# Patient Record
Sex: Female | Born: 1950 | Race: White | Hispanic: No | Smoking: Current every day smoker
Health system: Southern US, Community
[De-identification: ages and names within clinical notes are randomized; demographics above are authoritative.]

## PROBLEM LIST (undated history)

## (undated) DIAGNOSIS — Z8619 Personal history of other infectious and parasitic diseases: Secondary | ICD-10-CM

## (undated) DIAGNOSIS — N9489 Other specified conditions associated with female genital organs and menstrual cycle: Secondary | ICD-10-CM

## (undated) DIAGNOSIS — C801 Malignant (primary) neoplasm, unspecified: Secondary | ICD-10-CM

## (undated) DIAGNOSIS — N950 Postmenopausal bleeding: Secondary | ICD-10-CM

## (undated) DIAGNOSIS — L409 Psoriasis, unspecified: Secondary | ICD-10-CM

## (undated) DIAGNOSIS — Z923 Personal history of irradiation: Secondary | ICD-10-CM

## (undated) DIAGNOSIS — Z87442 Personal history of urinary calculi: Secondary | ICD-10-CM

## (undated) DIAGNOSIS — Z972 Presence of dental prosthetic device (complete) (partial): Secondary | ICD-10-CM

## (undated) DIAGNOSIS — M1990 Unspecified osteoarthritis, unspecified site: Secondary | ICD-10-CM

## (undated) DIAGNOSIS — Z9221 Personal history of antineoplastic chemotherapy: Secondary | ICD-10-CM

## (undated) DIAGNOSIS — M810 Age-related osteoporosis without current pathological fracture: Secondary | ICD-10-CM

## (undated) HISTORY — DX: Postmenopausal bleeding: N95.0

## (undated) HISTORY — DX: Psoriasis, unspecified: L40.9

## (undated) HISTORY — DX: Personal history of urinary calculi: Z87.442

## (undated) HISTORY — DX: Age-related osteoporosis without current pathological fracture: M81.0

## (undated) HISTORY — DX: Other specified conditions associated with female genital organs and menstrual cycle: N94.89

## (undated) HISTORY — PX: BREAST SURGERY: SHX581

## (undated) HISTORY — PX: LITHOTRIPSY: SUR834

## (undated) HISTORY — DX: Personal history of other infectious and parasitic diseases: Z86.19

---
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
## 1978-11-07 HISTORY — PX: BREAST EXCISIONAL BIOPSY: SUR124

## 1978-11-07 HISTORY — PX: TUBAL LIGATION: SHX77

## 2006-03-02 ENCOUNTER — Ambulatory Visit: Payer: Self-pay

## 2006-03-20 ENCOUNTER — Emergency Department: Payer: Self-pay | Admitting: Internal Medicine

## 2007-09-21 DIAGNOSIS — Z720 Tobacco use: Secondary | ICD-10-CM | POA: Insufficient documentation

## 2007-09-21 DIAGNOSIS — R002 Palpitations: Secondary | ICD-10-CM | POA: Insufficient documentation

## 2007-09-21 DIAGNOSIS — N951 Menopausal and female climacteric states: Secondary | ICD-10-CM | POA: Insufficient documentation

## 2007-10-10 ENCOUNTER — Ambulatory Visit: Payer: Self-pay

## 2008-09-30 ENCOUNTER — Ambulatory Visit: Payer: Self-pay | Admitting: Family Medicine

## 2008-09-30 DIAGNOSIS — L409 Psoriasis, unspecified: Secondary | ICD-10-CM | POA: Insufficient documentation

## 2008-09-30 DIAGNOSIS — K3189 Other diseases of stomach and duodenum: Secondary | ICD-10-CM | POA: Insufficient documentation

## 2008-10-21 ENCOUNTER — Ambulatory Visit: Payer: Self-pay | Admitting: Family Medicine

## 2008-11-07 HISTORY — PX: OTHER SURGICAL HISTORY: SHX169

## 2009-10-08 DIAGNOSIS — J449 Chronic obstructive pulmonary disease, unspecified: Secondary | ICD-10-CM | POA: Insufficient documentation

## 2009-11-04 ENCOUNTER — Ambulatory Visit: Payer: Self-pay | Admitting: Family Medicine

## 2010-10-11 ENCOUNTER — Ambulatory Visit: Payer: Self-pay

## 2010-11-09 ENCOUNTER — Ambulatory Visit: Payer: Self-pay

## 2011-12-15 ENCOUNTER — Ambulatory Visit: Payer: Self-pay | Admitting: Family Medicine

## 2012-05-01 ENCOUNTER — Ambulatory Visit: Payer: Self-pay | Admitting: Family Medicine

## 2012-12-17 ENCOUNTER — Ambulatory Visit: Payer: Self-pay | Admitting: Family Medicine

## 2013-06-13 ENCOUNTER — Ambulatory Visit: Payer: Self-pay | Admitting: Family Medicine

## 2013-06-21 DIAGNOSIS — N200 Calculus of kidney: Secondary | ICD-10-CM | POA: Insufficient documentation

## 2014-11-06 LAB — HEPATIC FUNCTION PANEL
ALT: 12 U/L (ref 7–35)
AST: 13 U/L (ref 13–35)

## 2014-11-06 LAB — LIPID PANEL
Cholesterol: 172 mg/dL (ref 0–200)
HDL: 56 mg/dL (ref 35–70)
LDL Cholesterol: 93 mg/dL
Triglycerides: 116 mg/dL (ref 40–160)

## 2014-11-06 LAB — BASIC METABOLIC PANEL
BUN: 15 mg/dL (ref 4–21)
Creatinine: 0.7 mg/dL (ref 0.5–1.1)
Glucose: 94 mg/dL
Potassium: 4.4 mmol/L (ref 3.4–5.3)
Sodium: 141 mmol/L (ref 137–147)

## 2014-11-06 LAB — CBC AND DIFFERENTIAL
HCT: 41 % (ref 36–46)
Hemoglobin: 13.6 g/dL (ref 12.0–16.0)
Platelets: 264 10*3/uL (ref 150–399)
WBC: 7.4 10^3/mL

## 2014-12-09 ENCOUNTER — Ambulatory Visit: Payer: Self-pay | Admitting: Family Medicine

## 2015-06-26 ENCOUNTER — Ambulatory Visit (INDEPENDENT_AMBULATORY_CARE_PROVIDER_SITE_OTHER): Payer: PRIVATE HEALTH INSURANCE | Admitting: Family Medicine

## 2015-06-26 ENCOUNTER — Encounter: Payer: Self-pay | Admitting: Family Medicine

## 2015-06-26 VITALS — BP 114/68 | HR 63 | Temp 98.6°F | Resp 16 | Wt 160.0 lb

## 2015-06-26 DIAGNOSIS — M722 Plantar fascial fibromatosis: Secondary | ICD-10-CM | POA: Insufficient documentation

## 2015-06-26 DIAGNOSIS — Z87442 Personal history of urinary calculi: Secondary | ICD-10-CM | POA: Insufficient documentation

## 2015-06-26 DIAGNOSIS — R1032 Left lower quadrant pain: Secondary | ICD-10-CM

## 2015-06-26 DIAGNOSIS — M810 Age-related osteoporosis without current pathological fracture: Secondary | ICD-10-CM | POA: Insufficient documentation

## 2015-06-26 DIAGNOSIS — R109 Unspecified abdominal pain: Secondary | ICD-10-CM

## 2015-06-26 DIAGNOSIS — Z720 Tobacco use: Secondary | ICD-10-CM | POA: Insufficient documentation

## 2015-06-26 LAB — POCT URINALYSIS DIPSTICK
Glucose, UA: NEGATIVE
Ketones, UA: NEGATIVE
Nitrite, UA: NEGATIVE
Protein, UA: NEGATIVE
Spec Grav, UA: 1.025
Urobilinogen, UA: 1
pH, UA: 6.5

## 2015-06-26 MED ORDER — CIPROFLOXACIN HCL 500 MG PO TABS: 500.0000 mg | ORAL_TABLET | Freq: Two times a day (BID) | ORAL | Status: AC

## 2015-06-26 MED ORDER — METRONIDAZOLE 500 MG PO TABS: 500.0000 mg | ORAL_TABLET | Freq: Two times a day (BID) | ORAL | Status: AC

## 2015-06-26 NOTE — Progress Notes (Signed)
Patient: Katherine Snyder Female    DOB: [birth year]   64 y.o.   MRN: [id number] Visit Date: 06/26/2015  Today's Provider: Lelon Huh, MD   Chief Complaint  Patient presents with  . Abdominal Pain    x 1 week   Subjective:    Abdominal Pain This is a new problem. The current episode started in the past 7 days. The problem occurs intermittently. The problem has been gradually worsening. The pain is located in the LLQ. The abdominal pain does not radiate. Associated symptoms include nausea and weight loss. Pertinent negatives include no anorexia, arthralgias, belching, constipation, diarrhea, dysuria, fever, flatus, frequency, headaches, hematuria, melena, myalgias or vomiting. The pain is aggravated by movement. Relieved by: resting. Treatments tried: Aleve. The treatment provided no relief.  Pain is constant for the last week. States had kidney stones in the past.  Last BM yesterday, no constipation or straining.       No Known Allergies Previous Medications   ALENDRONATE (FOSAMAX) 70 MG TABLET    Take 1 tablet by mouth once a week. Take 30 minutes prior to first meal of that day    Review of Systems  Constitutional: Positive for weight loss. Negative for fever.  Gastrointestinal: Positive for nausea and abdominal pain. Negative for vomiting, diarrhea, constipation, melena, anorexia and flatus.  Genitourinary: Negative for dysuria, frequency and hematuria.  Musculoskeletal: Negative for myalgias and arthralgias.  Neurological: Negative for headaches.    Social History  Substance Use Topics  . Smoking status: Current Every Day Smoker -- 0.75 packs/day    Types: Cigarettes  . Smokeless tobacco: Not on file  . Alcohol Use: No   Objective:   BP 114/68 mmHg  Pulse 63  Temp(Src) 98.6 F (37 C) (Oral)  Resp 16  Wt 160 lb (72.576 kg)  SpO2 97%  Physical Exam   General Appearance:    Alert, cooperative, no distress  Eyes:    PERRL, conjunctiva/corneas clear,  EOM's intact       Lungs:     Clear to auscultation bilaterally, respirations unlabored  Heart:    Regular rate and rhythm  Neurologic:   Awake, alert, oriented x 3. No apparent focal neurological           defect.   Abd:   No CVAT. Mild LLQ tenderness. No masses, no rebound. No Guarding. BS + x 4. Normal exam otherwise.    Results for orders placed or performed in visit on 06/26/15  POCT Urinalysis Dipstick  Result Value Ref Range   Color, UA Amber yellow    Clarity, UA clear    Glucose, UA negative    Bilirubin, UA small    Ketones, UA negative    Spec Grav, UA 1.025    Blood, UA Trace (Non hemolyzed)    pH, UA 6.5    Protein, UA negative    Urobilinogen, UA 1.0    Nitrite, UA negative    Leukocytes, UA Trace (A) Negative       Assessment & Plan:     1. . LLQ abdominal pain Diverticulosis versus renal calculi versus ovarian pathology. Treat empirically for diverticulosis/itis with BID cipro and metronidazole. She is in no distress and XR is now closed. She is to go to ER if pain worsens over weekend. Anticipate XR abdomen or pelvic u/s if not improved over the weekend.  - POCT Urinalysis Dipstick      Lelon Huh, MD  [facility]  [place]

## 2015-06-28 LAB — URINE CULTURE

## 2015-06-29 ENCOUNTER — Telehealth: Payer: Self-pay | Admitting: *Deleted

## 2015-06-29 DIAGNOSIS — R1032 Left lower quadrant pain: Secondary | ICD-10-CM

## 2015-06-29 NOTE — Telephone Encounter (Signed)
Tried to reach patient, no answer and vm not set up yet. Will try back later.

## 2015-06-29 NOTE — Telephone Encounter (Signed)
-----   Message from Birdie Sons, MD sent at 06/26/2015  5:04 PM EDT ----- Regarding: Follow up  Please check with patient to see if abdominal has improved over the weekend. If not weill need to order abdominal XR. Thanks.

## 2015-06-29 NOTE — Telephone Encounter (Signed)
-----   Message from Birdie Sons, MD sent at 06/28/2015  6:12 PM EDT ----- Urine cultures show minor urinary tract infection which should resolve with antibiotics that were prescribed last week. Sx should be nearly resolved. If not then need to get KUB Xray for left lower quadant pain.

## 2015-06-30 ENCOUNTER — Other Ambulatory Visit: Payer: Self-pay | Admitting: *Deleted

## 2015-06-30 DIAGNOSIS — R1032 Left lower quadrant pain: Secondary | ICD-10-CM

## 2015-06-30 NOTE — Telephone Encounter (Signed)
Patient returned call. Patient stated that she is not feeling any better, still having abd pain. Patient is agreeable to having the x-ray done.

## 2015-06-30 NOTE — Telephone Encounter (Signed)
Xray ordered. Patient notified.  ?

## 2015-07-01 ENCOUNTER — Ambulatory Visit
Admission: RE | Admit: 2015-07-01 | Discharge: 2015-07-01 | Disposition: A | Discharge status: Home / Self Care | Payer: No Typology Code available for payment source | Source: Ambulatory Visit | Attending: Family Medicine | Admitting: Family Medicine

## 2015-07-01 DIAGNOSIS — K5900 Constipation, unspecified: Secondary | ICD-10-CM | POA: Diagnosis not present

## 2015-07-01 DIAGNOSIS — R1032 Left lower quadrant pain: Secondary | ICD-10-CM | POA: Diagnosis present

## 2015-07-02 ENCOUNTER — Telehealth: Payer: Self-pay | Admitting: *Deleted

## 2015-07-02 NOTE — Telephone Encounter (Signed)
-----   Message from Birdie Sons, MD sent at 07/02/2015  8:16 AM EDT ----- Xrays shows Large amount of stool noted throughout the colon consistent with constipation. If she has been having difficulty with constipation then she should take a fleets enema and a dose of Miralax. If she does not feel constipated still having abdominal pain then she needs a pelvic ultrasound.

## 2015-07-03 NOTE — Telephone Encounter (Signed)
Patient was notified of results. Patient stated that she is still having abdominal pain. Patient stated that she has only been able to void small amounts of stool occasionally. Patient stated that she wants to try taking the enema and Miralax, however she wants to wait and take it on Friday night 07/03/15 due to work.

## 2016-02-11 ENCOUNTER — Encounter: Payer: Self-pay | Admitting: Family Medicine

## 2016-02-11 ENCOUNTER — Ambulatory Visit (INDEPENDENT_AMBULATORY_CARE_PROVIDER_SITE_OTHER): Payer: PRIVATE HEALTH INSURANCE | Admitting: Family Medicine

## 2016-02-11 VITALS — BP 136/76 | HR 62 | Temp 98.5°F | Resp 14 | Wt 159.0 lb

## 2016-02-11 DIAGNOSIS — J029 Acute pharyngitis, unspecified: Secondary | ICD-10-CM | POA: Diagnosis not present

## 2016-02-11 DIAGNOSIS — R05 Cough: Secondary | ICD-10-CM

## 2016-02-11 DIAGNOSIS — R059 Cough, unspecified: Secondary | ICD-10-CM

## 2016-02-11 LAB — POCT INFLUENZA A/B
Influenza A, POC: NEGATIVE
Influenza B, POC: NEGATIVE

## 2016-02-11 LAB — POCT RAPID STREP A (OFFICE): Rapid Strep A Screen: NEGATIVE

## 2016-02-11 MED ORDER — FIRST-DUKES MOUTHWASH MT SUSP
OROMUCOSAL | Status: DC
Start: 2016-02-11 — End: 2016-02-12

## 2016-02-11 NOTE — Progress Notes (Signed)
Patient ID: Katherine Snyder, female   DOB: 09/18/1951, 65 y.o.   MRN: [id number]   Patient: Katherine Snyder Female    DOB: 1950/12/27   65 y.o.   MRN: [id number] Visit Date: 02/11/2016  Today's Provider: [name], PA   Chief Complaint  Patient presents with  . Cough   Subjective:    Cough This is a new problem. The current episode started in the past 7 days. The problem has been unchanged. The problem occurs constantly. The cough is non-productive. Associated symptoms include rhinorrhea and a sore throat. Risk factors for lung disease include smoking/tobacco exposure. Treatments tried: Copywriter, advertising. The treatment provided no relief.   Past Medical History  Diagnosis Date  . Psoriasis   . History of kidney stones   . Osteoporosis   . History of chicken pox   . History of mumps   . History of measles    Patient Active Problem List   Diagnosis Date Noted  . History of kidney stones 06/26/2015  . OP (osteoporosis) 06/26/2015  . Plantar fasciitis 06/26/2015  . Current tobacco use 06/26/2015  . Chronic airway obstruction ([place]) 10/08/2009  . Psoriasis 09/30/2008  . 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 Menopausal symptom 09/21/2007  . Tobacco use 09/21/2007  . Awareness of heartbeats 09/21/2007   Past Surgical History  Procedure Laterality Date  . Injection varicose veins in legs  2010    Dr. Hulda Humphrey  . Lithotripsy  1994, 2004    for renal stones  . Tubal ligation  1980  . Appendectomy  1960   Family History  Problem Relation Age of Onset  . Hypertension Mother   . Breast cancer Mother   . Transient ischemic attack Mother   . Arthritis Mother   . Hypothyroidism Mother   . Lung cancer Mother   . Diabetes Father     type 2  . Hypertension Father   . Arthritis Father   . Hyperlipidemia Son   . Stomach cancer Maternal Grandmother   . Stroke Maternal Grandfather      Previous Medications   ALENDRONATE (FOSAMAX) 70 MG TABLET    Take 1 tablet  by mouth once a week. Take 30 minutes prior to first meal of that day   No Known Allergies  Review of Systems  Constitutional: Negative.   HENT: Positive for rhinorrhea and sore throat.   Eyes: Negative.   Respiratory: Positive for cough.   Cardiovascular: Negative.   Gastrointestinal: Negative.   Endocrine: Negative.   Genitourinary: Negative.   Musculoskeletal: Negative.   Skin: Negative.   Allergic/Immunologic: Negative.   Neurological: Negative.   Hematological: Negative.   Psychiatric/Behavioral: Negative.     Social History  Substance Use Topics  . Smoking status: Current Every Day Smoker -- 0.75 packs/day    Types: Cigarettes  . Smokeless tobacco: Not on file  . Alcohol Use: No   Objective:   BP 136/76 mmHg  Pulse 62  Temp(Src) 98.5 F (36.9 C) (Oral)  Resp 14  Wt 159 lb (72.122 kg)  Physical Exam  Constitutional: She is oriented to person, place, and time. She appears well-developed and well-nourished.  HENT:  Head: Normocephalic.  Right Ear: External ear normal.  Left Ear: External ear normal.  Nose: Nose normal.  Reddened tonsilar pillars without exudates.  Eyes: Conjunctivae are normal.  Neck: Normal range of motion. Neck supple.  Cardiovascular: Normal rate, regular rhythm and normal heart sounds.   Pulmonary/Chest: Effort normal and breath  sounds normal.  Abdominal: Soft. Bowel sounds are normal.  Lymphadenopathy:    She has no cervical adenopathy.  Neurological: She is alert and oriented to person, place, and time.  Skin: No rash noted.      Assessment & Plan:     1. Pharyngitis Onset over the past week with postnasal drip. No fever but remembered some of her family have had similar symptoms. Strep test and flu test negative. Suspect viral pharyngitis versus allergies. Treat with Duke's Magic Mouthwash and recheck prn. - POCT rapid strep A - POCT Influenza A/B  2. Cough Onset with sore throat and postnasal drip. May use antihistamine and  Delsym or Mucinex-DM. Increase fluids. Flu test negative. Recheck prn. - POCT Influenza A/B

## 2016-02-11 NOTE — Patient Instructions (Signed)

## 2016-02-12 ENCOUNTER — Telehealth: Payer: Self-pay | Admitting: Family Medicine

## 2016-02-12 DIAGNOSIS — J029 Acute pharyngitis, unspecified: Secondary | ICD-10-CM

## 2016-02-12 MED ORDER — PREDNISOLONE 15 MG/5ML PO SOLN: ORAL | Status: DC

## 2016-02-12 NOTE — Telephone Encounter (Signed)
Pt states her insurance will not cover the Rx Diphenhyd-Hydrocort-Nystatin (FIRST-DUKES MOUTHWASH) SUSP.  Pt is requesting a different Rx that insurance will cover.  [facility].  CB#[phone]/MW

## 2016-02-12 NOTE — Telephone Encounter (Signed)
Patient advised as directed below. Patient verbalized understanding.  

## 2016-02-12 NOTE — Telephone Encounter (Signed)
[facility] [place]. Use 1/2 teaspoon of the Prelone with 1/2 teaspoon of liquid Benadryl to gargle then swallow twice a day (after breakfast and at bedtime).

## 2016-02-12 NOTE — Telephone Encounter (Signed)
Simona Huh, Would you like to change this? Thanks  ED

## 2016-02-23 ENCOUNTER — Telehealth: Payer: Self-pay | Admitting: Family Medicine

## 2016-02-23 NOTE — Telephone Encounter (Signed)
Please advise 

## 2016-02-23 NOTE — Telephone Encounter (Signed)
Pt stated that prednisoLONE (PRELONE) 15 MG/5ML SOLN isn't helping and would like something else sent to [facility]. Pt stated her lips, tongue, and gums are burning. Please advise. Thanks TNP

## 2016-02-23 NOTE — Telephone Encounter (Signed)
Probably should see mouth, tongue and lips again if continuing to have symptoms. Was not able to leave message due to voicemail not set up (per recording).

## 2016-02-24 NOTE — Telephone Encounter (Signed)
Attempted to contact patient no answer nor voicemail set up.

## 2016-05-13 ENCOUNTER — Ambulatory Visit (INDEPENDENT_AMBULATORY_CARE_PROVIDER_SITE_OTHER): Payer: PRIVATE HEALTH INSURANCE | Admitting: Family Medicine

## 2016-05-13 ENCOUNTER — Encounter: Payer: Self-pay | Admitting: Family Medicine

## 2016-05-13 VITALS — BP 122/70 | HR 72 | Temp 97.9°F | Resp 16 | Ht 67.0 in | Wt 158.0 lb

## 2016-05-13 DIAGNOSIS — L409 Psoriasis, unspecified: Secondary | ICD-10-CM | POA: Diagnosis not present

## 2016-05-13 DIAGNOSIS — M810 Age-related osteoporosis without current pathological fracture: Secondary | ICD-10-CM

## 2016-05-13 DIAGNOSIS — Z720 Tobacco use: Secondary | ICD-10-CM | POA: Diagnosis not present

## 2016-05-13 DIAGNOSIS — Z1211 Encounter for screening for malignant neoplasm of colon: Secondary | ICD-10-CM | POA: Diagnosis not present

## 2016-05-13 DIAGNOSIS — Z0000 Encounter for general adult medical examination without abnormal findings: Secondary | ICD-10-CM

## 2016-05-13 DIAGNOSIS — M722 Plantar fascial fibromatosis: Secondary | ICD-10-CM

## 2016-05-13 DIAGNOSIS — Z124 Encounter for screening for malignant neoplasm of cervix: Secondary | ICD-10-CM

## 2016-05-13 DIAGNOSIS — L219 Seborrheic dermatitis, unspecified: Secondary | ICD-10-CM

## 2016-05-13 DIAGNOSIS — Z1239 Encounter for other screening for malignant neoplasm of breast: Secondary | ICD-10-CM

## 2016-05-13 LAB — POCT URINALYSIS DIPSTICK
Bilirubin, UA: NEGATIVE
Glucose, UA: NEGATIVE
Ketones, UA: NEGATIVE
Leukocytes, UA: NEGATIVE
Nitrite, UA: NEGATIVE
Protein, UA: NEGATIVE
Spec Grav, UA: 1.01
Urobilinogen, UA: 0.2
pH, UA: 6

## 2016-05-13 LAB — IFOBT (OCCULT BLOOD): IFOBT: NEGATIVE

## 2016-05-13 MED ORDER — DESONIDE 0.05 % EX CREA: TOPICAL_CREAM | CUTANEOUS | Status: DC

## 2016-05-13 MED ORDER — BETAMETHASONE DIPROPIONATE 0.05 % EX CREA: TOPICAL_CREAM | Freq: Two times a day (BID) | CUTANEOUS | Status: DC

## 2016-05-13 MED ORDER — ALENDRONATE SODIUM 70 MG PO TABS: 70.0000 mg | ORAL_TABLET | ORAL | Status: DC

## 2016-05-13 NOTE — Progress Notes (Signed)
Patient: Katherine Snyder, Female    DOB: 11-23-1950, 65 y.o.   MRN: [id number] Visit Date: 05/13/2016  Today's Provider: [name], PA   No chief complaint on file.  Subjective:    Annual physical exam Katherine Snyder is a 65 y.o. female who presents today for health maintenance and complete physical. She feels well. She reports exercising occasionally. She reports she is sleeping well.  -----------------------------------------------------------------   Review of Systems  Constitutional: Negative.   HENT: Negative.        Bad taste in mouth after using Icy Hot or Blue Emu creams.  Eyes: Negative.   Respiratory: Negative.   Cardiovascular: Negative.   Gastrointestinal: Negative.   Genitourinary: Negative.   Musculoskeletal: Negative.        Slight soreness in left thumb occasionally. Right heel pain from plantar fasciitis diagnosed in the past.  Skin: Positive for rash.       Intermittent flare of seborrhea in eyebrows and a spot of psoriasis on the right lower leg with occasional flare on elbows.  Neurological: Negative.     Social History      She  reports that she has been smoking Cigarettes.  She has been smoking about 0.75 packs per day. She does not have any smokeless tobacco history on file. She reports that she does not drink alcohol or use illicit drugs.       Social History   Social History  . Marital Status: Single    Spouse Name: N/A  . Number of Children: 1  . Years of Education: N/A   Occupational History  . Employed     Works at a [employer] doing [employer]   Social History Main Topics  . Smoking status: Current Every Day Smoker -- 0.75 packs/day    Types: Cigarettes  . Smokeless tobacco: None  . Alcohol Use: No  . Drug Use: No  . Sexual Activity: Not Asked   Other Topics Concern  . None   Social History Narrative    Past Medical History  Diagnosis Date  . Psoriasis   . History of kidney stones   . Osteoporosis   . History of  chicken pox   . History of mumps   . History of measles      Patient Active Problem List   Diagnosis Date Noted  . History of kidney stones 06/26/2015  . OP (osteoporosis) 06/26/2015  . Plantar fasciitis 06/26/2015  . Current tobacco use 06/26/2015  . Chronic airway obstruction ([place] 10/08/2009  . Psoriasis 09/30/2008  . 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 Menopausal symptom 09/21/2007  . Tobacco use 09/21/2007  . Awareness of heartbeats 09/21/2007    Past Surgical History  Procedure Laterality Date  . Injection varicose veins in legs  2010    Dr. Hulda Humphrey  . Lithotripsy  1994, 2004    for renal stones  . Tubal ligation  1980  . Appendectomy  1960    Family History        Family Status  Relation Status Death Age  . Mother Deceased 52    died from Metastatic cancer. Gallblader cancer  . Father Alive   . Sister Alive   . Brother Alive   . Son Alive   . Maternal Grandmother Deceased   . Maternal Grandfather Deceased         Her family history includes Arthritis in her father and mother; Breast cancer in her mother; Diabetes  in her father; Hyperlipidemia in her son; Hypertension in her father and mother; Hypothyroidism in her mother; Lung cancer in her mother; Stomach cancer in her maternal grandmother; Stroke in her maternal grandfather; Transient ischemic attack in her mother.    No Known Allergies  Current Outpatient Prescriptions on File Prior to Visit  Medication Sig Dispense Refill  . alendronate (FOSAMAX) 70 MG tablet Take 1 tablet by mouth once a week. Reported on 05/13/2016     No current facility-administered medications on file prior to visit.   Patient Care Team: Margo Common, PA as PCP - General (Physician Assistant)     Objective:   Vitals: BP 122/70 mmHg  Pulse 72  Temp(Src) 97.9 F (36.6 C)  Resp 16  Ht 5\' 7"  (1.702 m)  Wt 158 lb (71.668 kg)  BMI 24.74 kg/m2  Wt Readings from Last 3 Encounters:  05/13/16 158 lb  (71.668 kg)  02/11/16 159 lb (72.122 kg)  06/26/15 160 lb (72.576 kg)    Physical Exam  Constitutional: She is oriented to person, place, and time. She appears well-developed and well-nourished.  HENT:  Head: Normocephalic and atraumatic.  Right Ear: External ear normal.  Left Ear: External ear normal.  Nose: Nose normal.  Mouth/Throat: Oropharynx is clear and moist.  Edentulous - wearing upper dentures only today.  Eyes: Conjunctivae and EOM are normal. Pupils are equal, round, and reactive to light. Right eye exhibits no discharge.  Neck: Normal range of motion. Neck supple. No tracheal deviation present. No thyromegaly present.  Cardiovascular: Normal rate, regular rhythm, normal heart sounds and intact distal pulses.   No murmur heard. Pulmonary/Chest: Effort normal and breath sounds normal. No respiratory distress. She has no wheezes. She has no rales. She exhibits no tenderness.  Abdominal: Soft. She exhibits no distension and no mass. There is no tenderness. There is no rebound and no guarding.  Genitourinary: Vagina normal and uterus normal. Guaiac negative stool. No vaginal discharge found.  Musculoskeletal: Normal range of motion. She exhibits no edema or tenderness.  Lymphadenopathy:    She has no cervical adenopathy.  Neurological: She is alert and oriented to person, place, and time. She has normal reflexes. No cranial nerve deficit. She exhibits normal muscle tone. Coordination normal.  Skin: Skin is warm and dry. Rash noted. No erythema.  Minimal pink rash on extensor surfaces of both elbows from psoriasis. One nummular spot on the right lower leg approximately 1 cm in diameter and rough. Faint seborrhea dermatitis between eyebrows.  Psychiatric: She has a normal mood and affect. Her behavior is normal. Judgment and thought content normal.   Depression Screen PHQ 2/9 Scores 05/13/2016  PHQ - 2 Score 0   Assessment & Plan:     Routine Health Maintenance and Physical  Exam  Exercise Activities and Dietary recommendations Goals    Recommend 20-30 minutes of exercise 4 times a week with low fat diet.      Immunization History  Administered Date(s) Administered  . Pneumococcal Polysaccharide-23 10/08/2009  . Tdap 10/08/2009    Health Maintenance  Topic Date Due  . HIV Screening  09/14/1968  . COLONOSCOPY  09/15/2003  . ZOSTAVAX  09/14/2013  . INFLUENZA VACCINE  06/07/2016  . MAMMOGRAM  12/09/2016  . PAP SMEAR  11/06/2017  . TETANUS/TDAP  10/09/2019  . Hepatitis C Screening  Completed      Discussed health benefits of physical activity, and encouraged her to engage in regular exercise appropriate for her age and condition.    --------------------------------------------------------------------  1. Annual physical exam General health good. Wants to check with insurance about coverage of shingles vaccination and colonoscopy before scheduling. Will get routine follow up labs. Hemoccult test was negative today with good urinalysis.  - CBC with Differential/Platelet - Comprehensive metabolic panel - Lipid panel - TSH - POC Hemoccult Bld/Stl (1-Cd Office Dx) - POCT urinalysis dipstick  2. Psoriasis Only one lesion on the right lower leg without itching or discomfort. Diprolene has helped control outbreaks. Will give refill of it and check labs. - CBC with Differential/Platelet - betamethasone dipropionate (DIPROLENE) 0.05 % cream; Apply topically 2 (two) times daily. As directed for psoriasis on body.  Dispense: 30 g; Refill: 1  3. Current tobacco use Counseled regarding harmful effects of smoking. Should consider nicotine patches, tapering back or Chantix.   4. OP (osteoporosis) Last bone density test on 12-09-14 confirmed osteoporosis. Has stopped Fosamax a few months ago. Will recheck vitamin-D level and continue calcium supplement. - Comprehensive metabolic panel - VITAMIN D 25 Hydroxy (Vit-D Deficiency, Fractures) - alendronate  (FOSAMAX) 70 MG tablet; Take 1 tablet (70 mg total) by mouth once a week. Reported on 05/13/2016  Dispense: 12 tablet; Refill: 3  5. Seborrhea Oily pink rash between eyebrows with some flaking and oily texture. Good control with Desowen cream. Will refill and get CBC. - CBC with Differential/Platelet - desonide (DESOWEN) 0.05 % cream; Apply once a day sparingly to seborrhea as needed on face.  Dispense: 30 g; Refill: 1  6. Plantar fasciitis Pain in anterior medial heels when standing all day and sometimes after sitting for a while. May use OTC NSAID of choice and recheck if no better in a couple weeks. May need referral to podiatrist.  7. Breast cancer screening History of one benign breast biopsy of a lesion in the lower outer quadrant with well healed scar on the left breast (near 4 o'clock position). Will get screening mammograms. Last exam normal on 12-09-14. - MM Digital Screening  8. Cervical cancer screening Normal exam with slightly retroverted uterus. No lesions noted. Pap test collected. LMP age 75. - Pap IG (Image Guided)  9. Colon cancer screening Negative stool test for occult blood. Wants to check with her insurance about coverage before scheduling colonoscopy. - IFOBT POC (occult bld, rslt in office)   [name], [facility]

## 2016-05-13 NOTE — Patient Instructions (Signed)
Seborrheic Dermatitis Seborrheic dermatitis involves pink or red skin with greasy, flaky scales. It usually occurs on the scalp, and it is often called dandruff. This condition may also affect the eyebrows, nose, ears, chest, and the bearded area of men's faces. It often occurs where skin has more oil (sebaceous) glands. It may come and go for no known reason, and it is often long-lasting (chronic). CAUSES The cause is not known. RISK FACTORS This condition is more like to develop in:  People who are stressed or tired.  People who have skin conditions, such as acne.  People who have certain conditions, such as:  HIV (human immunodeficiency virus).  AIDS (acquired immunodeficiency syndrome).  Parkinson disease.  An eating disorder.  Stroke.  Depression.  Epilepsy.  Alcoholism.  People who live in places that have extreme weather.  People who have a family history of seborrheic dermatitis.  People who use skin creams that are made with alcohol.  People who are 31-39 years old.  People who take certain medicines. SYMPTOMS Symptoms of this condition include:  Thick scales on the scalp.  Redness on the face or in the armpits.  Skin that is flaky. The flakes may be white or yellow.  Skin that seems oily or dry but is not helped with moisturizers.  Itching or burning in the affected areas. DIAGNOSIS This condition is diagnosed with a medical history and physical exam. A sample of your skin may be tested (skin biopsy). You may need to see a skin specialist (dermatologist). TREATMENT There is no cure for this condition, but treatment can help to manage the symptoms. Treatment may include:  Cortisone (steroid) ointments, creams, and lotions.  Over-the-counter or prescription shampoos. HOME CARE INSTRUCTIONS  Apply over-the-counter and prescription medicines only as told by your health care provider.  Keep all follow-up visits as told by your health care provider.  This is important.  Try to reduce your stress, such as with yoga or mediation. If you need help to reduce stress, ask your health care provider.  Shower or bathe as told by your health care provider.  Use any medicated shampoos as told by your health care provider. SEEK MEDICAL CARE IF:  Your symptoms do not improve with treatment.  Your symptoms get worse.  You have new symptoms.   This information is not intended to replace advice given to you by your health care provider. Make sure you discuss any questions you have with your health care provider.   Document Released: 10/24/2005 Document Revised: 07/15/2015 Document Reviewed: 03/11/2015 Elsevier Interactive Patient Education 2016 Elsevier [employer] Psoriasis Psoriasis is a long-term (chronic) condition of skin inflammation. It occurs because your immune system causes skin cells to form too quickly. As a result, too many skin cells grow and create raised, red patches (plaques) that look silvery on your skin. Plaques may appear anywhere on your body. They can be any size or shape. Psoriasis can come and go. The condition varies from mild to very severe. It cannot be passed from one person to another (not contagious).  CAUSES  The cause of psoriasis is not known, but certain factors can make the condition worse. These include:   Damage or trauma to the skin, such as cuts, scrapes, sunburn, and dryness.  Lack of sunlight.  Certain medicines.  Alcohol.  Tobacco use.  Stress.  Infections caused by bacteria or viruses. RISK FACTORS This condition is more likely to develop in:  People with a family history of psoriasis.  People  who are Caucasian.  People who are between the ages of 15-13 and 55-12 years old. SYMPTOMS  There are five different types of psoriasis. You can have more than one type of psoriasis during your life. Types are:   Plaque.  Guttate.  Inverse.  Pustular.  Erythrodermic. Each type of psoriasis has  different symptoms.   Plaque psoriasis symptoms include red, raised plaques with a silvery white coating (scale). These plaques may be itchy. Your nails may be pitted and crumbly or fall off.  Guttate psoriasis symptoms include small red spots that often show up on your trunk, arms, and legs. These spots may develop after you have been sick, especially with strep throat.  Inverse psoriasis symptoms include plaques in your underarm area, under your breasts, or on your genitals, groin, or buttocks.  Pustular psoriasis symptoms include pus-filled bumps that are painful, red, and swollen on the palms of your hands or the soles of your feet. You also may feel exhausted, feverish, weak, or have no appetite.  Erythrodermic psoriasis symptoms include bright red skin that may look burned. You may have a fast heartbeat and a body temperature that is too high or too low. You may be itchy or in pain. DIAGNOSIS  Your health care provider may suspect psoriasis based on your symptoms and family history. Your health care provider will also do a physical exam. This may include a procedure to remove a tissue sample (biopsy) for testing. You may also be referred to a health care provider who specializes in skin diseases (dermatologist).  TREATMENT There is no cure for this condition, but treatment can help manage it. Goals of treatment include:   Helping your skin heal.  Reducing itching and inflammation.  Slowing the growth of new skin cells.  Helping your immune system respond better to your skin. Treatment varies, depending on the severity of your condition. Treatment may include:   Creams or ointments.  Ultraviolet ray exposure (light therapy). This may include natural sunlight or light therapy in a medical office.  Medicines (systemic therapy). These medicines can help your body better manage skin cell turnover and inflammation. They may be used along with light therapy or ointments. You may also get  antibiotic medicines if you have an infection. HOME CARE INSTRUCTIONS Skin Care  Moisturize your skin as needed. Only use moisturizers that have been approved by your health care provider.   Apply cool compresses to the affected areas.   Do not scratch your skin.  Lifestyle  Do not use tobacco products. This includes cigarettes, chewing tobacco, and e-cigarettes. If you need help quitting, ask your health care provider.  Drink little or no alcohol.   Try techniques for stress reduction, such as meditation or yoga.  Get exposure to the sun as told by your health care provider. Do not get sunburned.   Consider joining a psoriasis support group.  Medicines  Take or use over-the-counter and prescription medicines only as told by your health care provider.  If you were prescribed an antibiotic, take or use it as told by your health care provider. Do not stop taking the antibiotic even if your condition starts to improve. General Instructions  Keep a journal to help track what triggers an outbreak. Try to avoid any triggers.   See a counselor or social worker if feelings of sadness, frustration, and hopelessness about your condition are interfering with your work and relationships.  Keep all follow-up visits as told by your health care provider. This  is important. SEEK MEDICAL CARE IF:  Your pain gets worse.  You have increasing redness or warmth in the affected areas.   You have new or worsening pain or stiffness in your joints.  Your nails start to break easily or pull away from the nail bed.   You have a fever.   You feel depressed.   This information is not intended to replace advice given to you by your health care provider. Make sure you discuss any questions you have with your health care provider.   Document Released: 10/21/2000 Document Revised: 07/15/2015 Document Reviewed: 03/11/2015 Elsevier Interactive Patient Education 2016 Elsevier  Inc. Plantar Fasciitis Plantar fasciitis is a painful foot condition that affects the heel. It occurs when the band of tissue that connects the toes to the heel bone (plantar fascia) becomes irritated. This can happen after exercising too much or doing other repetitive activities (overuse injury). The pain from plantar fasciitis can range from mild irritation to severe pain that makes it difficult for you to walk or move. The pain is usually worse in the morning or after you have been sitting or lying down for a while. CAUSES This condition may be caused by:  Standing for long periods of time.  Wearing shoes that do not fit.  Doing high-impact activities, including running, aerobics, and ballet.  Being overweight.  Having an abnormal way of walking (gait).  Having tight calf muscles.  Having high arches in your feet.  Starting a new athletic activity. SYMPTOMS The main symptom of this condition is heel pain. Other symptoms include:  Pain that gets worse after activity or exercise.  Pain that is worse in the morning or after resting.  Pain that goes away after you walk for a few minutes. DIAGNOSIS This condition may be diagnosed based on your signs and symptoms. Your health care provider will also do a physical exam to check for:  A tender area on the bottom of your foot.  A high arch in your foot.  Pain when you move your foot.  Difficulty moving your foot. You may also need to have imaging studies to confirm the diagnosis. These can include:  X-rays.  Ultrasound.  MRI. TREATMENT  Treatment for plantar fasciitis depends on the severity of the condition. Your treatment may include:  Rest, ice, and over-the-counter pain medicines to manage your pain.  Exercises to stretch your calves and your plantar fascia.  A splint that holds your foot in a stretched, upward position while you sleep (night splint).  Physical therapy to relieve symptoms and prevent problems in  the future.  Cortisone injections to relieve severe pain.  Extracorporeal shock wave therapy (ESWT) to stimulate damaged plantar fascia with electrical impulses. It is often used as a last resort before surgery.  Surgery, if other treatments have not worked after 12 months. HOME CARE INSTRUCTIONS  Take medicines only as directed by your health care provider.  Avoid activities that cause pain.  Roll the bottom of your foot over a bag of ice or a bottle of cold water. Do this for 20 minutes, 3-4 times a day.  Perform simple stretches as directed by your health care provider.  Try wearing athletic shoes with air-sole or gel-sole cushions or soft shoe inserts.  Wear a night splint while sleeping, if directed by your health care provider.  Keep all follow-up appointments with your health care provider. PREVENTION   Do not perform exercises or activities that cause heel pain.  Consider finding  low-impact activities if you continue to have problems.  Lose weight if you need to. The best way to prevent plantar fasciitis is to avoid the activities that aggravate your plantar fascia. SEEK MEDICAL CARE IF:  Your symptoms do not go away after treatment with home care measures.  Your pain gets worse.  Your pain affects your ability to move or do your daily activities.   This information is not intended to replace advice given to you by your health care provider. Make sure you discuss any questions you have with your health care provider.   Document Released: 07/19/2001 Document Revised: 07/15/2015 Document Reviewed: 09/03/2014 Elsevier Interactive Patient Education [employer].

## 2016-05-14 LAB — COMPREHENSIVE METABOLIC PANEL
ALT: 15 IU/L (ref 0–32)
AST: 21 IU/L (ref 0–40)
Albumin/Globulin Ratio: 1.7 (ref 1.2–2.2)
Albumin: 4.3 g/dL (ref 3.6–4.8)
Alkaline Phosphatase: 57 IU/L (ref 39–117)
BUN/Creatinine Ratio: 22 (ref 12–28)
BUN: 17 mg/dL (ref 8–27)
Bilirubin Total: 0.4 mg/dL (ref 0.0–1.2)
CO2: 24 mmol/L (ref 18–29)
Calcium: 9.5 mg/dL (ref 8.7–10.3)
Chloride: 104 mmol/L (ref 96–106)
Creatinine, Ser: 0.79 mg/dL (ref 0.57–1.00)
GFR calc Af Amer: 93 mL/min/{1.73_m2} (ref >59)
GFR calc non Af Amer: 80 mL/min/{1.73_m2} (ref >59)
Globulin, Total: 2.6 g/dL (ref 1.5–4.5)
Glucose: 94 mg/dL (ref 65–99)
Potassium: 4.8 mmol/L (ref 3.5–5.2)
Sodium: 141 mmol/L (ref 134–144)
Total Protein: 6.9 g/dL (ref 6.0–8.5)

## 2016-05-14 LAB — CBC WITH DIFFERENTIAL/PLATELET
Basophils Absolute: 0.1 10*3/uL (ref 0.0–0.2)
Basos: 1 %
EOS (ABSOLUTE): 0.2 10*3/uL (ref 0.0–0.4)
Eos: 2 %
Hematocrit: 39 % (ref 34.0–46.6)
Hemoglobin: 13.4 g/dL (ref 11.1–15.9)
Immature Grans (Abs): 0 10*3/uL (ref 0.0–0.1)
Immature Granulocytes: 0 %
Lymphocytes Absolute: 2.5 10*3/uL (ref 0.7–3.1)
Lymphs: 33 %
MCH: 30.3 pg (ref 26.6–33.0)
MCHC: 34.4 g/dL (ref 31.5–35.7)
MCV: 88 fL (ref 79–97)
Monocytes Absolute: 0.7 10*3/uL (ref 0.1–0.9)
Monocytes: 9 %
Neutrophils Absolute: 4.2 10*3/uL (ref 1.4–7.0)
Neutrophils: 55 %
Platelets: 235 10*3/uL (ref 150–379)
RBC: 4.42 x10E6/uL (ref 3.77–5.28)
RDW: 13.4 % (ref 12.3–15.4)
WBC: 7.6 10*3/uL (ref 3.4–10.8)

## 2016-05-14 LAB — LIPID PANEL
Chol/HDL Ratio: 2.9 ratio units (ref 0.0–4.4)
Cholesterol, Total: 165 mg/dL (ref 100–199)
HDL: 56 mg/dL (ref >39)
LDL Calculated: 94 mg/dL (ref 0–99)
Triglycerides: 73 mg/dL (ref 0–149)
VLDL Cholesterol Cal: 15 mg/dL (ref 5–40)

## 2016-05-14 LAB — VITAMIN D 25 HYDROXY (VIT D DEFICIENCY, FRACTURES): Vit D, 25-Hydroxy: 34.4 ng/mL (ref 30.0–100.0)

## 2016-05-14 LAB — TSH: TSH: 0.776 u[IU]/mL (ref 0.450–4.500)

## 2016-05-16 ENCOUNTER — Telehealth: Payer: Self-pay

## 2016-05-16 NOTE — Telephone Encounter (Signed)
Tried calling patient and unable to leave a voicemail. Will try again later.

## 2016-05-16 NOTE — Telephone Encounter (Signed)
-----   Message from Margo Common, [place] sent at 05/16/2016 11:40 AM EDT ----- All blood tests normal. Continue present Fosamax for osteoporosis. Recheck annually.

## 2016-05-17 LAB — PAP IG (IMAGE GUIDED): PAP Smear Comment: 0

## 2016-05-19 NOTE — Telephone Encounter (Signed)
Advised patient of results.  

## 2016-06-06 ENCOUNTER — Telehealth: Payer: Self-pay | Admitting: Family Medicine

## 2016-06-14 NOTE — Telephone Encounter (Signed)
Patient will call to schedule mammogram.

## 2016-06-14 NOTE — Telephone Encounter (Signed)
Ask patient if she has accomplished mammograms, yet.

## 2016-08-17 ENCOUNTER — Ambulatory Visit
Admission: RE | Admit: 2016-08-17 | Discharge: 2016-08-17 | Disposition: A | Discharge status: Home / Self Care | Payer: No Typology Code available for payment source | Source: Ambulatory Visit | Attending: Family Medicine | Admitting: Family Medicine

## 2016-08-17 DIAGNOSIS — Z1231 Encounter for screening mammogram for malignant neoplasm of breast: Secondary | ICD-10-CM | POA: Insufficient documentation

## 2016-08-18 ENCOUNTER — Telehealth: Payer: Self-pay

## 2016-08-18 NOTE — Telephone Encounter (Signed)
Advised pt of lab results. Pt verbally acknowledges understanding. Emily Drozdowski, CMA   

## 2016-08-18 NOTE — Telephone Encounter (Signed)
-----   Message from Margo Common, [place] sent at 08/18/2016 10:08 AM EDT ----- Normal mammogram report. Radiologist recommended repeat screening in a year.

## 2017-04-17 ENCOUNTER — Other Ambulatory Visit: Payer: Self-pay | Admitting: Family Medicine

## 2017-04-17 DIAGNOSIS — M810 Age-related osteoporosis without current pathological fracture: Secondary | ICD-10-CM

## 2017-11-07 DIAGNOSIS — C801 Malignant (primary) neoplasm, unspecified: Secondary | ICD-10-CM

## 2017-11-07 HISTORY — DX: Malignant (primary) neoplasm, unspecified: C80.1

## 2018-02-26 ENCOUNTER — Encounter: Payer: Self-pay | Admitting: Emergency Medicine

## 2018-02-26 ENCOUNTER — Emergency Department
Admission: EM | Admit: 2018-02-26 | Discharge: 2018-02-26 | Disposition: A | Discharge status: Home / Self Care | Payer: BLUE CROSS/BLUE SHIELD | Attending: Emergency Medicine | Admitting: Emergency Medicine

## 2018-02-26 ENCOUNTER — Emergency Department: Payer: BLUE CROSS/BLUE SHIELD

## 2018-02-26 DIAGNOSIS — F17210 Nicotine dependence, cigarettes, uncomplicated: Secondary | ICD-10-CM | POA: Diagnosis not present

## 2018-02-26 DIAGNOSIS — N939 Abnormal uterine and vaginal bleeding, unspecified: Secondary | ICD-10-CM | POA: Diagnosis present

## 2018-02-26 DIAGNOSIS — N950 Postmenopausal bleeding: Secondary | ICD-10-CM

## 2018-02-26 LAB — COMPREHENSIVE METABOLIC PANEL
ALT: 14 U/L (ref 14–54)
AST: 22 U/L (ref 15–41)
Albumin: 3.8 g/dL (ref 3.5–5.0)
Alkaline Phosphatase: 56 U/L (ref 38–126)
Anion gap: 7 (ref 5–15)
BUN: 18 mg/dL (ref 6–20)
CO2: 25 mmol/L (ref 22–32)
Calcium: 8.9 mg/dL (ref 8.9–10.3)
Chloride: 107 mmol/L (ref 101–111)
Creatinine, Ser: 0.76 mg/dL (ref 0.44–1.00)
GFR calc Af Amer: >60 mL/min (ref >60)
GFR calc non Af Amer: >60 mL/min (ref >60)
Glucose, Bld: 112 mg/dL — ABNORMAL HIGH (ref 65–99)
Potassium: 3.3 mmol/L — ABNORMAL LOW (ref 3.5–5.1)
Sodium: 139 mmol/L (ref 135–145)
Total Bilirubin: 0.7 mg/dL (ref 0.3–1.2)
Total Protein: 7.1 g/dL (ref 6.5–8.1)

## 2018-02-26 LAB — CBC
HCT: 39.1 % (ref 35.0–47.0)
Hemoglobin: 13.6 g/dL (ref 12.0–16.0)
MCH: 31.1 pg (ref 26.0–34.0)
MCHC: 34.7 g/dL (ref 32.0–36.0)
MCV: 89.6 fL (ref 80.0–100.0)
Platelets: 226 10*3/uL (ref 150–440)
RBC: 4.37 MIL/uL (ref 3.80–5.20)
RDW: 13.4 % (ref 11.5–14.5)
WBC: 6.9 10*3/uL (ref 3.6–11.0)

## 2018-02-26 LAB — TYPE AND SCREEN
ABO/RH(D): A POS
Antibody Screen: NEGATIVE

## 2018-02-26 NOTE — ED Provider Notes (Signed)
[facility] Emergency Department Provider Note   ____________________________________________    I have reviewed the triage vital signs and the nursing notes.   HISTORY  Chief Complaint Vaginal Bleeding     HPI Katherine Snyder is a 67 y.o. female who presents with vaginal bleeding.  Patient is 14 years postmenopausal, she has had no vaginal bleeding since menopause.  Over the last 2 days she developed vaginal bleeding which is at times been significant, it has been constant.  She reports that she picked up a bag of dirt and felt the pain in her left flank and later in the day she developed vaginal bleeding.  No dizziness.  No longer has any pain.  No lower abdominal pain.  No pelvic pain.   Past Medical History:  Diagnosis Date  . History of chicken pox   . History of kidney stones   . History of measles   . History of mumps   . Osteoporosis   . Psoriasis     Patient Active Problem List   Diagnosis Date Noted  . Seborrhea 05/13/2016  . History of kidney stones 06/26/2015  . OP (osteoporosis) 06/26/2015  . Plantar fasciitis 06/26/2015  . Current tobacco use 06/26/2015  . Chronic airway obstruction ([place]) 10/08/2009  . Psoriasis 09/30/2008  . 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 Menopausal symptom 09/21/2007  . Tobacco use 09/21/2007  . Awareness of heartbeats 09/21/2007    Past Surgical History:  Procedure Laterality Date  . APPENDECTOMY  1960  . Injection varicose veins in legs  2010   Dr. Hulda Humphrey  . LITHOTRIPSY  1994, 2004   for renal stones  . TUBAL LIGATION  1980    Prior to Admission medications   Medication Sig Start Date End Date Taking? Authorizing Provider  alendronate (FOSAMAX) 70 MG tablet TAKE 1 TABLET (70 MG TOTAL) BY MOUTH ONCE A WEEK. REPORTED ON 05/13/2016 04/17/17   Chrismon, Vickki Muff, PA  betamethasone dipropionate (DIPROLENE) 0.05 % cream Apply topically 2 (two) times daily. As directed for  psoriasis on body. 05/13/16   Chrismon, Vickki Muff, PA  desonide (DESOWEN) 0.05 % cream Apply once a day sparingly to seborrhea as needed on face. 05/13/16   Chrismon, Vickki Muff, PA     Allergies Patient has no known allergies.  Family History  Problem Relation Age of Onset  . Hypertension Mother   . Breast cancer Mother   . Transient ischemic attack Mother   . Arthritis Mother   . Hypothyroidism Mother   . Lung cancer Mother   . Diabetes Father        type 2  . Hypertension Father   . Arthritis Father   . Hyperlipidemia Son   . Stomach cancer Maternal Grandmother   . Stroke Maternal Grandfather     Social History Social History   Tobacco Use  . Smoking status: Current Every Day Smoker    Packs/day: 0.75    Types: Cigarettes  Substance Use Topics  . Alcohol use: No    Alcohol/week: 0.0 oz  . Drug use: No    Review of Systems  Constitutional: No dizziness Eyes: No visual changes.  ENT: No neck pain Cardiovascular: Denies chest pain. Respiratory: Denies shortness of breath. Gastrointestinal: As above Genitourinary: Vaginal bleeding as above Musculoskeletal: Negative for back pain. Skin: Negative for rash. Neurological: Negative for headaches   ____________________________________________   PHYSICAL EXAM:  VITAL SIGNS: ED Triage Vitals [02/26/18 0745]  Enc Vitals  Group     BP (!) 136/55     Pulse Rate 81     Resp 14     Temp 98.2 F (36.8 C)     Temp Source Oral     SpO2 96 %     Weight 79.4 kg (175 lb)     Height 1.702 m (5\' 7" )     Head Circumference      Peak Flow      Pain Score 1     Pain Loc      Pain Edu?      Excl. in [place]?     Constitutional: Alert and oriented.  Pleasant and interactive Eyes: Conjunctivae are normal.  No pallor  Nose: No congestion/rhinnorhea. Mouth/Throat: Mucous membranes are moist.    Cardiovascular: Normal rate, regular rhythm. Grossly normal heart sounds.  Good peripheral circulation. Respiratory: Normal  respiratory effort.  No retractions.  Gastrointestinal: Soft and nontender. No distention.  No CVA tenderness.  No suprapubic tenderness Genitourinary: deferred Musculoskeletal:  Warm and well perfused Neurologic:  Normal speech and language. No gross focal neurologic deficits are appreciated.  Skin:  Skin is warm, dry and intact. No rash noted. Psychiatric: Mood and affect are normal. Speech and behavior are normal.  ____________________________________________   LABS (all labs ordered are listed, but only abnormal results are displayed)  Labs Reviewed  COMPREHENSIVE METABOLIC PANEL - Abnormal; Notable for the following components:      Result Value   Potassium 3.3 (*)    Glucose, Bld 112 (*)    All other components within normal limits  CBC  TYPE AND SCREEN   ____________________________________________  EKG  None ____________________________________________  RADIOLOGY  Ultrasound pelvis demonstrates thickened endometrium ____________________________________________   PROCEDURES  Procedure(s) performed: No  Procedures   Critical Care performed: No ____________________________________________   INITIAL IMPRESSION / ASSESSMENT AND PLAN / ED COURSE  Pertinent labs & imaging results that were available during my care of the patient were reviewed by me and considered in my medical decision making (see chart for details).  Patient well-appearing in no acute distress.  Hemoglobin is 13.6.  Vital signs reassuring.  Discussed with her that we will perform ultrasound here but will likely require close GYN follow-up  Ultrasound demonstrates thickened endometrium, discussed with the patient and the need for close follow-up with gynecology for endometrial sampling    ____________________________________________   FINAL CLINICAL IMPRESSION(S) / ED DIAGNOSES  Final diagnoses:  Vaginal bleeding        Note:  This document was prepared using Dragon voice  recognition software and may include unintentional dictation errors.    Lavonia Drafts, MD 02/26/18 1109

## 2018-02-26 NOTE — Discharge Instructions (Addendum)
Please be sure to follow up with GYN as we discussed.

## 2018-02-26 NOTE — ED Notes (Signed)

## 2018-02-26 NOTE — ED Triage Notes (Signed)
Pt reports picking up a large bag of [employer] dirt on Saturday, reports hurting left side on Saturday, denies pain at present time. Reports large amount of vaginal bleeding since then, reports having gone through menopause 14 years ago. Reports large clots and having to change pads every couple of hours.

## 2018-03-01 ENCOUNTER — Ambulatory Visit: Payer: BLUE CROSS/BLUE SHIELD | Admitting: Obstetrics and Gynecology

## 2018-03-01 ENCOUNTER — Encounter: Payer: Self-pay | Admitting: Obstetrics and Gynecology

## 2018-03-01 VITALS — BP 115/73 | HR 64 | Ht 67.0 in | Wt 177.8 lb

## 2018-03-01 DIAGNOSIS — N950 Postmenopausal bleeding: Secondary | ICD-10-CM

## 2018-03-01 NOTE — Progress Notes (Signed)
HPI:      [name] is a 67 y.o. G1P1001 who LMP was No LMP recorded. Patient is postmenopausal.  Subjective:   She presents today as a follow-up to an emergency department visit.  She has had approximately 5 days of postmenopausal bleeding.  2 of those days were heavy.  Her bleeding is not heavy today but she continues to experience it.  An ultrasound performed in the emergency department reveals a thickened endometrium.   Of significant note patient reports a normal Pap smear 2 years ago and no previous abnormals.     Hx: The following portions of the patient's history were reviewed and updated as appropriate:             She  has a past medical history of History of chicken pox, History of kidney stones, History of measles, History of mumps, Osteoporosis, PMB (postmenopausal bleeding), and Psoriasis. She does not have any pertinent problems on file. She  has a past surgical history that includes Injection varicose veins in legs (2010); Lithotripsy (1994, 2004); Tubal ligation (1980); and Appendectomy (1960). Her family history includes Arthritis in her father and mother; Breast cancer in her mother; Diabetes in her father; Hyperlipidemia in her son; Hypertension in her father and mother; Hypothyroidism in her mother; Lung cancer in her mother; Stomach cancer in her maternal grandmother; Stroke in her maternal grandfather; Transient ischemic attack in her mother. She  reports that she has been smoking cigarettes.  She has been smoking about 0.75 packs per day. She has never used smokeless tobacco. She reports that she does not drink alcohol or use drugs. She has a current medication list which includes the following prescription(s): betamethasone dipropionate and desonide. She has No Known Allergies.       Review of Systems:  Review of Systems  Constitutional: Denied constitutional symptoms, night sweats, recent illness, fatigue, fever, insomnia and weight loss.  Eyes: Denied eye  symptoms, eye pain, photophobia, vision change and visual disturbance.  Ears/Nose/Throat/Neck: Denied ear, nose, throat or neck symptoms, hearing loss, nasal discharge, sinus congestion and sore throat.  Cardiovascular: Denied cardiovascular symptoms, arrhythmia, chest pain/pressure, edema, exercise intolerance, orthopnea and palpitations.  Respiratory: Denied pulmonary symptoms, asthma, pleuritic pain, productive sputum, cough, dyspnea and wheezing.  Gastrointestinal: Denied, gastro-esophageal reflux, melena, nausea and vomiting.  Genitourinary: See HPI for additional information.  Musculoskeletal: Denied musculoskeletal symptoms, stiffness, swelling, muscle weakness and myalgia.  Dermatologic: Denied dermatology symptoms, rash and scar.  Neurologic: Denied neurology symptoms, dizziness, headache, neck pain and syncope.  Psychiatric: Denied psychiatric symptoms, anxiety and depression.  Endocrine: Denied endocrine symptoms including hot flashes and night sweats.   Meds:   Current Outpatient Medications on File Prior to Visit  Medication Sig Dispense Refill  . betamethasone dipropionate (DIPROLENE) 0.05 % cream Apply topically 2 (two) times daily. As directed for psoriasis on body. 30 g 1  . desonide (DESOWEN) 0.05 % cream Apply once a day sparingly to seborrhea as needed on face. 30 g 1   No current facility-administered medications on file prior to visit.     Objective:     Vitals:   03/01/18 0812  BP: 115/73  Pulse: 64              Physical examination   Pelvic:   Vulva: Normal appearance.  No lesions.  Vagina: No lesions or abnormalities noted.  Support: Normal pelvic support.  Urethra No masses tenderness or scarring.  Meatus Normal size without lesions or prolapse.  Cervix: Normal appearance.  No lesions.  Anus: Normal exam.  No lesions.  Perineum: Normal exam.  No lesions.        Bimanual   Uterus: Normal size.  Non-tender.  Mobile.  AV.  Adnexae: No masses.   Non-tender to palpation.  Cul-de-sac: Negative for abnormality.   Endometrial Biopsy After discussion with the patient regarding her abnormal uterine bleeding I recommended that she proceed with an endometrial biopsy for further diagnosis. The risks, benefits, alternatives, and indications for an endometrial biopsy were discussed with the patient in detail. She understood the risks including infection, bleeding, cervical laceration and uterine perforation.  Verbal consent was obtained.   PROCEDURE NOTE:  Vacurette endometrial biopsy was performed using aseptic technique with iodine preparation.  The uterus was sounded to a length of 9 cm.  Adequate sampling was obtained with minimal blood loss.  The patient tolerated the procedure well.  Disposition will be pending pathology   Assessment:    G1P1001 Patient Active Problem List   Diagnosis Date Noted  . Seborrhea 05/13/2016  . History of kidney stones 06/26/2015  . OP (osteoporosis) 06/26/2015  . Plantar fasciitis 06/26/2015  . Current tobacco use 06/26/2015  . Chronic airway obstruction ([place]) 10/08/2009  . Psoriasis 09/30/2008  . 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 Menopausal symptom 09/21/2007  . Tobacco use 09/21/2007  . Awareness of heartbeats 09/21/2007     1. Postmenopausal bleeding     Thickened endometrium-likely some form of hyperplasia.   Plan:            1.  Endometrial biopsy   The patient and I discussed multiple causes of postmenopausal bleeding in detail.  The risks of each were discussed.  The work-up for postmenopausal bleeding discussed.  The future possible treatments medical and surgical were reviewed.  When the biopsy results return we will decide management at that time. Orders No orders of the defined types were placed in this encounter.   No orders of the defined types were placed in this encounter.     F/U  Return in about 1 week (around 03/08/2018). I spent 32 minutes with  this patient of which greater than 50% was spent discussing endometrial hyperplasia, and mutual biopsy, future work-up and treatments.  Finis Bud, M.D. 03/01/2018 9:51 AM

## 2018-03-02 ENCOUNTER — Telehealth: Payer: Self-pay | Admitting: Obstetrics and Gynecology

## 2018-03-02 MED ORDER — NORETHINDRONE ACETATE 5 MG PO TABS: 10.0000 mg | ORAL_TABLET | Freq: Every day | ORAL | 0 refills | Status: DC

## 2018-03-02 NOTE — Telephone Encounter (Signed)
The patient called and stated that she was seen yesterday and her prescription was never sent in. The patient stated that her preferred pharmacy is [facility] in [facility], And that she would like a call when the prescription has been sent in because she has been to the pharmacy 3 times already. No other   Information was disclosed. Please advise.

## 2018-03-02 NOTE — Addendum Note (Signed)
Addended by: Elouise Munroe on: 03/02/2018 11:17 AM   Modules accepted: Orders

## 2018-03-02 NOTE — Telephone Encounter (Signed)
Per DJE- aygestin 10mg  erx. Pt aware.

## 2018-03-08 ENCOUNTER — Encounter: Payer: BLUE CROSS/BLUE SHIELD | Admitting: Obstetrics and Gynecology

## 2018-03-13 LAB — PATHOLOGY

## 2018-03-15 ENCOUNTER — Ambulatory Visit: Payer: BLUE CROSS/BLUE SHIELD | Admitting: Obstetrics and Gynecology

## 2018-03-15 ENCOUNTER — Encounter: Payer: Self-pay | Admitting: Obstetrics and Gynecology

## 2018-03-15 VITALS — BP 127/70 | HR 93 | Ht 67.0 in | Wt 176.5 lb

## 2018-03-15 DIAGNOSIS — C541 Malignant neoplasm of endometrium: Secondary | ICD-10-CM

## 2018-03-15 DIAGNOSIS — R300 Dysuria: Secondary | ICD-10-CM | POA: Diagnosis not present

## 2018-03-15 LAB — POCT URINALYSIS DIPSTICK
Bilirubin, UA: NEGATIVE
Glucose, UA: NEGATIVE
Ketones, UA: NEGATIVE
Leukocytes, UA: NEGATIVE
Nitrite, UA: NEGATIVE
Odor: NEGATIVE
Protein, UA: NEGATIVE
Spec Grav, UA: <=1.005 — AB (ref 1.010–1.025)
Urobilinogen, UA: 0.2 E.U./dL
pH, UA: 7 (ref 5.0–8.0)

## 2018-03-15 NOTE — Progress Notes (Signed)
HPI:      [name] is a 67 y.o. G1P1001 who LMP was No LMP recorded. Patient is postmenopausal.  Subjective:   She presents today to discuss her endometrial biopsy results.  She is taking Aygestin and reports that she is no longer having vaginal bleeding.  She does complain of leaking urine with coughing.  This is new for her.  She also complains of some pain with urination.    Hx: The following portions of the patient's history were reviewed and updated as appropriate:             She  has a past medical history of History of chicken pox, History of kidney stones, History of measles, History of mumps, Osteoporosis, PMB (postmenopausal bleeding), and Psoriasis. She does not have any pertinent problems on file. She  has a past surgical history that includes Injection varicose veins in legs (2010); Lithotripsy (1994, 2004); Tubal ligation (1980); and Appendectomy (1960). Her family history includes Arthritis in her father and mother; Breast cancer in her mother; Diabetes in her father; Hyperlipidemia in her son; Hypertension in her father and mother; Hypothyroidism in her mother; Lung cancer in her mother; Stomach cancer in her maternal grandmother; Stroke in her maternal grandfather; Transient ischemic attack in her mother. She  reports that she has been smoking cigarettes.  She has been smoking about 0.75 packs per day. She has never used smokeless tobacco. She reports that she does not drink alcohol or use drugs. She has a current medication list which includes the following prescription(s): betamethasone dipropionate, desonide, and norethindrone. She has No Known Allergies.       Review of Systems:  Review of Systems  Constitutional: Denied constitutional symptoms, night sweats, recent illness, fatigue, fever, insomnia and weight loss.  Eyes: Denied eye symptoms, eye pain, photophobia, vision change and visual disturbance.  Ears/Nose/Throat/Neck: Denied ear, nose, throat or neck  symptoms, hearing loss, nasal discharge, sinus congestion and sore throat.  Cardiovascular: Denied cardiovascular symptoms, arrhythmia, chest pain/pressure, edema, exercise intolerance, orthopnea and palpitations.  Respiratory: Denied pulmonary symptoms, asthma, pleuritic pain, productive sputum, cough, dyspnea and wheezing.  Gastrointestinal: Denied, gastro-esophageal reflux, melena, nausea and vomiting.  Genitourinary: See HPI for additional information.  Musculoskeletal: Denied musculoskeletal symptoms, stiffness, swelling, muscle weakness and myalgia.  Dermatologic: Denied dermatology symptoms, rash and scar.  Neurologic: Denied neurology symptoms, dizziness, headache, neck pain and syncope.  Psychiatric: Denied psychiatric symptoms, anxiety and depression.  Endocrine: Denied endocrine symptoms including hot flashes and night sweats.   Meds:   Current Outpatient Medications on File Prior to Visit  Medication Sig Dispense Refill  . betamethasone dipropionate (DIPROLENE) 0.05 % cream Apply topically 2 (two) times daily. As directed for psoriasis on body. 30 g 1  . desonide (DESOWEN) 0.05 % cream Apply once a day sparingly to seborrhea as needed on face. 30 g 1  . norethindrone (AYGESTIN) 5 MG tablet Take 2 tablets (10 mg total) by mouth daily. 60 tablet 0   No current facility-administered medications on file prior to visit.     Objective:     Vitals:   03/15/18 0807  BP: 127/70  Pulse: 93              Endometrial biopsy results reviewed directly with the patient  Assessment:    G1P1001 Patient Active Problem List   Diagnosis Date Noted  . Seborrhea 05/13/2016  . History of kidney stones 06/26/2015  . OP (osteoporosis) 06/26/2015  . Plantar fasciitis 06/26/2015  .  Current tobacco use 06/26/2015  . Chronic airway obstruction ([place]) 10/08/2009  . Psoriasis 09/30/2008  . 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 Menopausal symptom 09/21/2007  . Tobacco  use 09/21/2007  . Awareness of heartbeats 09/21/2007     1. Malignant neoplasm of endometrium ([place])   2. Dysuria     MMT of endometrium   Plan:            1.  Referred to GYN oncology  2.  Urine for C&S Orders Orders Placed This Encounter  Procedures  . Urine Culture  . POCT urinalysis dipstick    No orders of the defined types were placed in this encounter.     F/U  No follow-ups on file. I spent 18 minutes with this patient of which greater than 50% was spent discussing her diagnosis of endometrial cancer.  Continued work-up, likelihood of surgery, types of surgery, postop care.  Referral to [facility] oncology discussed in detail.  Finis Bud, M.D. 03/15/2018 8:39 AM

## 2018-03-16 NOTE — Addendum Note (Signed)
Addended by: Finis Bud on: 03/16/2018 09:04 AM   Modules accepted: Orders

## 2018-03-17 LAB — URINE CULTURE

## 2018-03-28 ENCOUNTER — Inpatient Hospital Stay: Payer: BLUE CROSS/BLUE SHIELD

## 2018-03-28 ENCOUNTER — Inpatient Hospital Stay: Payer: BLUE CROSS/BLUE SHIELD | Attending: Obstetrics and Gynecology | Admitting: Obstetrics and Gynecology

## 2018-03-28 VITALS — BP 113/70 | HR 66 | Temp 97.8°F | Resp 18 | Ht 67.0 in | Wt 176.8 lb

## 2018-03-28 DIAGNOSIS — C541 Malignant neoplasm of endometrium: Secondary | ICD-10-CM | POA: Diagnosis present

## 2018-03-28 DIAGNOSIS — Z720 Tobacco use: Secondary | ICD-10-CM | POA: Insufficient documentation

## 2018-03-28 NOTE — Addendum Note (Signed)
Addended by: Mellody Drown on: 03/28/2018 07:02 PM   Modules accepted: Orders, SmartSet

## 2018-03-28 NOTE — H&P (View-Only) (Signed)
Gynecologic Oncology Consult Visit   Referring Provider: Dr. Amalia Hailey  Chief Complaint: Endometrial Cancer  Subjective:  Smith Mcnicholas is a 67 y.o. female who is seen in consultation from Dr. Amalia Hailey for Endometrial Cancer (mixed mullerian tumor).   Patient initially presented to Emergency Department on 02/26/18 for post-menopausal bleeding. She was reportedly 14 years post menopausal and had had a 2 day episode of vaginal bleeding that was constant.   02/26/18- Ultrasound Pelvic Complete w/ Transvaginal: Endometrium thickness up to 55mm. No focal solid or cystic change. IMPRESSION: Mildly enlarged uterus. No discrete fibroids. Markedly thickened endometrium. In the setting of post-menopausal bleeding, endometrial sampling is indicated to exclude carcinoma. If results are benign, sonohysterogram should be considered for focal lesion work-up. (Ref: Radiological Reasoning: Algorithmic Workup of Abnormal Vaginal Bleeding with Endovaginal Sonography and Sonohysterography. AJR 2008; 654:Y50-35) Nonvisualization of the right ovary. Normal-appearing left ovary. No significant uterine fibroids were observed.  She was seen by Dr. Ellard Artis son 03/01/18 who performed vacurette endometrial biopsy. Uterus was sounded to length to 9 cm. She was started on Aygestin and bleeding stopped.   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  COMMENT:  BASED ON INITIAL HISTOLOGIC FINDINGS, IMMUNOHISTOCHEMISTRY IS EVALUATED:  THE SARCOMATOUS TUMOR COMPONENT IS DIFFUSELY POSITIVE FOR CD10 AND THE CARCINOMA COMPONENT IS POSITIVE FOR PANKERATIN, SUPPORTING THE DIAGNOSIS.   Today, patient reports bleeding has resolved.   Problem List: Patient Active Problem List   Diagnosis Date Noted  . Seborrhea 05/13/2016  . History of kidney stones 06/26/2015  . OP (osteoporosis) 06/26/2015  . Plantar fasciitis 06/26/2015   . Current tobacco use 06/26/2015  . Chronic airway obstruction ([place]) 10/08/2009  . Psoriasis 09/30/2008  . 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 Menopausal symptom 09/21/2007  . Tobacco use 09/21/2007  . Awareness of heartbeats 09/21/2007    Past Medical History: Past Medical History:  Diagnosis Date  . History of chicken pox   . History of kidney stones   . History of measles   . History of mumps   . Osteoporosis   . PMB (postmenopausal bleeding)   . Psoriasis     Past Surgical History: Past Surgical History:  Procedure Laterality Date  . APPENDECTOMY  1960  . Injection varicose veins in legs  2010   Dr. Hulda Humphrey  . LITHOTRIPSY  1994, 2004   for renal stones  . TUBAL LIGATION  1980     OB History:  OB History  Gravida Para Term Preterm AB Living  1 1 1     1   SAB TAB Ectopic Multiple Live Births          1    # Outcome Date GA Lbr Len/2nd Weight Sex Delivery Anes PTL Lv  1 Term 1    M Vag-Spont   LIV  G1P1001 LMP:   Family History: Family History  Problem Relation Age of Onset  . Hypertension Mother   . Breast cancer Mother   . Transient ischemic attack Mother   . Arthritis Mother   . Hypothyroidism Mother   . Lung cancer Mother   . Diabetes Father        type 2  . Hypertension Father   . Arthritis Father   . Hyperlipidemia Son   . Stomach cancer Maternal Grandmother   . Stroke Maternal Grandfather     Social History: Social History   Socioeconomic History  . Marital status: Single  Spouse name: Not on file  . Number of children: 1  . Years of education: Not on file  . Highest education level: Not on file  Occupational History  . Occupation: Employed    Comment: Works at a [employer] doing [employer]  Social Needs  . Financial resource strain: Not on file  . Food insecurity:    Worry: Not on file    Inability: Not on file  . Transportation needs:    Medical: Not on file    Non-medical: Not on file  Tobacco Use   . Smoking status: Current Every Day Smoker    Packs/day: 0.75    Types: Cigarettes  . Smokeless tobacco: Never Used  Substance and Sexual Activity  . Alcohol use: No    Alcohol/week: 0.0 oz  . Drug use: No  . Sexual activity: Not Currently  Lifestyle  . Physical activity:    Days per week: Not on file    Minutes per session: Not on file  . Stress: Not on file  Relationships  . Social connections:    Talks on phone: Not on file    Gets together: Not on file    Attends religious service: Not on file    Active member of club or organization: Not on file    Attends meetings of clubs or organizations: Not on file    Relationship status: Not on file  . Intimate partner violence:    Fear of current or ex partner: Not on file    Emotionally abused: Not on file    Physically abused: Not on file    Forced sexual activity: Not on file  Other Topics Concern  . Not on file  Social History Narrative  . Not on file    Allergies: No Known Allergies  Current Medications: Current Outpatient Medications  Medication Sig Dispense Refill  . betamethasone dipropionate (DIPROLENE) 0.05 % cream Apply topically 2 (two) times daily. As directed for psoriasis on body. 30 g 1  . desonide (DESOWEN) 0.05 % cream Apply once a day sparingly to seborrhea as needed on face. 30 g 1  . norethindrone (AYGESTIN) 5 MG tablet Take 2 tablets (10 mg total) by mouth daily. 60 tablet 0   No current facility-administered medications for this visit.     Review of Systems General: negative for fevers, chills, fatigue, changes in sleep, changes in weight or appetite Skin: negative for changes in color, texture, moles or lesions Eyes: negative for changes in vision, pain, diplopia HEENT: negative for change in hearing, pain, discharge, tinnitus, vertigo, voice changes, sore throat, neck masses Breasts: negative for breast lumps Pulmonary: negative for dyspnea, orthopnea, productive cough Cardiac: negative for  palpitations, syncope, pain, discomfort, pressure Gastrointestinal: negative for dysphagia, nausea, vomiting, jaundice, pain, constipation, diarrhea, hematemesis, hematochezia Genitourinary/Sexual: negative for dysuria, discharge, hesitancy, nocturia, retention, stones, infections, STD's, incontinence Ob/Gyn: negative for irregular bleeding, pain Musculoskeletal: negative for pain, stiffness, swelling, range of motion limitation Hematology: negative for easy bruising, bleeding Neurologic/Psych: negative for headaches, seizures, paralysis, weakness, tremor, change in gait, change in sensation, mood swings, depression, anxiety, change in memory  Objective:  Physical Examination:  BP 113/70 (BP Location: Left Arm, Patient Position: Sitting)   Pulse 66   Temp 97.8 F (36.6 C) (Tympanic)   Resp 18   Ht 5\' 7"  (1.702 m)   Wt 176 lb 12.8 oz (80.2 kg)   SpO2 95%   BMI 27.69 kg/m     ECOG Performance Status: 0 - Asymptomatic  GENERAL: Patient is a well appearing female in no acute distress HEENT:  Sclerae anicteric.  Oropharynx clear and moist. No ulcerations or evidence of oropharyngeal candidiasis. Neck is supple.  NODES:  No cervical, supraclavicular, or axillary lymphadenopathy palpated.  LUNGS:  Clear to auscultation bilaterally.  No wheezes or rhonchi. HEART:  Regular rate and rhythm. No murmur appreciated. ABDOMEN:  Soft, nontender.  Positive, normoactive bowel sounds. No organomegaly palpated. MSK:  No focal spinal tenderness to palpation. Full range of motion bilaterally in the upper extremities. EXTREMITIES:  No peripheral edema.   SKIN:  Clear with no obvious rashes or skin changes. No nail dyscrasia. NEURO:  Nonfocal. Well oriented.  Appropriate affect. 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 No evidence of metastatic disease on exam.   Medical co-morbidities complicating care: smoker.  Plan:   Problem List Items Addressed This Visit    None    Visit Diagnoses    Endometrial cancer ([place])    -  Primary   Relevant Orders   CT Chest W Contrast   CT Abdomen Pelvis W Contrast     We discussed options for management including CT scan C/A/P to rule out metastatic disease in view of aggressive histology.  If this was positive she may be a candidate for primary chemotherapy.  If negative then would recommend TLH/BSO, sentinel lymph node mapping and biopsies and aortic lymph node dissection.  In view of more extensive staging needed, would prefer to do this at [facility], but not sure if her insurance would cover this.  Will clarify insurance status while awaiting CT scan results and then will schedule surgery at either [facility] or [facility] as soon as possible. Discussed with patient and brother that this is an aggressive type of cancer, but prognosis and treatment will be clearer when we have more information from CT scan and then path report after surgery.    The patient's diagnosis, an outline of the further diagnostic and laboratory studies which will be required, the recommendation for surgery, and alternatives were discussed with her and her accompanying family members.  All questions were answered to their satisfaction.  A total of 60 minutes were spent with the patient/family today; 50% was spent in education, counseling and coordination of care for uterine carcinosarcoma.    Beckey Rutter, DNP, AGNP-C [facility] [phone] (work cell) [phone] (office) 03/28/18 9:35 AM  I personally interviewed and examined the patient. Agreed with the above/below plan of care.  Patient/family questions were answered.  Mellody Drown, MD  CC:  [place], [place] [address] [place], [place] [address] [phone]

## 2018-03-28 NOTE — Progress Notes (Signed)
Gynecologic Oncology Consult Visit   Referring Provider: Dr. Amalia Hailey  Chief Complaint: Endometrial Cancer  Subjective:  Katherine Snyder is a 67 y.o. female who is seen in consultation from Dr. Amalia Hailey for Endometrial Cancer (mixed mullerian tumor).   Patient initially presented to Emergency Department on 02/26/18 for post-menopausal bleeding. She was reportedly 14 years post menopausal and had had a 2 day episode of vaginal bleeding that was constant.   02/26/18- Ultrasound Pelvic Complete w/ Transvaginal: Endometrium thickness up to 38mm. No focal solid or cystic change. IMPRESSION: Mildly enlarged uterus. No discrete fibroids. Markedly thickened endometrium. In the setting of post-menopausal bleeding, endometrial sampling is indicated to exclude carcinoma. If results are benign, sonohysterogram should be considered for focal lesion work-up. (Ref: Radiological Reasoning: Algorithmic Workup of Abnormal Vaginal Bleeding with Endovaginal Sonography and Sonohysterography. AJR 2008; 628:Z66-29) Nonvisualization of the right ovary. Normal-appearing left ovary. No significant uterine fibroids were observed.  She was seen by Dr. Ellard Artis son 03/01/18 who performed vacurette endometrial biopsy. Uterus was sounded to length to 9 cm. She was started on Aygestin and bleeding stopped.   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  COMMENT:  BASED ON INITIAL HISTOLOGIC FINDINGS, IMMUNOHISTOCHEMISTRY IS EVALUATED:  THE SARCOMATOUS TUMOR COMPONENT IS DIFFUSELY POSITIVE FOR CD10 AND THE CARCINOMA COMPONENT IS POSITIVE FOR PANKERATIN, SUPPORTING THE DIAGNOSIS.   Today, patient reports bleeding has resolved.   Problem List: Patient Active Problem List   Diagnosis Date Noted  . Seborrhea 05/13/2016  . History of kidney stones 06/26/2015  . OP (osteoporosis) 06/26/2015  . Plantar fasciitis 06/26/2015   . Current tobacco use 06/26/2015  . Chronic airway obstruction ([place]) 10/08/2009  . Psoriasis 09/30/2008  . 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 Menopausal symptom 09/21/2007  . Tobacco use 09/21/2007  . Awareness of heartbeats 09/21/2007    Past Medical History: Past Medical History:  Diagnosis Date  . History of chicken pox   . History of kidney stones   . History of measles   . History of mumps   . Osteoporosis   . PMB (postmenopausal bleeding)   . Psoriasis     Past Surgical History: Past Surgical History:  Procedure Laterality Date  . APPENDECTOMY  1960  . Injection varicose veins in legs  2010   Dr. Hulda Humphrey  . LITHOTRIPSY  1994, 2004   for renal stones  . TUBAL LIGATION  1980     OB History:  OB History  Gravida Para Term Preterm AB Living  1 1 1     1   SAB TAB Ectopic Multiple Live Births          1    # Outcome Date GA Lbr Len/2nd Weight Sex Delivery Anes PTL Lv  1 Term 66    M Vag-Spont   LIV  G1P1001 LMP:   Family History: Family History  Problem Relation Age of Onset  . Hypertension Mother   . Breast cancer Mother   . Transient ischemic attack Mother   . Arthritis Mother   . Hypothyroidism Mother   . Lung cancer Mother   . Diabetes Father        type 2  . Hypertension Father   . Arthritis Father   . Hyperlipidemia Son   . Stomach cancer Maternal Grandmother   . Stroke Maternal Grandfather     Social History: Social History   Socioeconomic History  . Marital status: Single  Spouse name: Not on file  . Number of children: 1  . Years of education: Not on file  . Highest education level: Not on file  Occupational History  . Occupation: Employed    Comment: Works at a [employer] doing [employer]  Social Needs  . Financial resource strain: Not on file  . Food insecurity:    Worry: Not on file    Inability: Not on file  . Transportation needs:    Medical: Not on file    Non-medical: Not on file  Tobacco Use   . Smoking status: Current Every Day Smoker    Packs/day: 0.75    Types: Cigarettes  . Smokeless tobacco: Never Used  Substance and Sexual Activity  . Alcohol use: No    Alcohol/week: 0.0 oz  . Drug use: No  . Sexual activity: Not Currently  Lifestyle  . Physical activity:    Days per week: Not on file    Minutes per session: Not on file  . Stress: Not on file  Relationships  . Social connections:    Talks on phone: Not on file    Gets together: Not on file    Attends religious service: Not on file    Active member of club or organization: Not on file    Attends meetings of clubs or organizations: Not on file    Relationship status: Not on file  . Intimate partner violence:    Fear of current or ex partner: Not on file    Emotionally abused: Not on file    Physically abused: Not on file    Forced sexual activity: Not on file  Other Topics Concern  . Not on file  Social History Narrative  . Not on file    Allergies: No Known Allergies  Current Medications: Current Outpatient Medications  Medication Sig Dispense Refill  . betamethasone dipropionate (DIPROLENE) 0.05 % cream Apply topically 2 (two) times daily. As directed for psoriasis on body. 30 g 1  . desonide (DESOWEN) 0.05 % cream Apply once a day sparingly to seborrhea as needed on face. 30 g 1  . norethindrone (AYGESTIN) 5 MG tablet Take 2 tablets (10 mg total) by mouth daily. 60 tablet 0   No current facility-administered medications for this visit.     Review of Systems General: negative for fevers, chills, fatigue, changes in sleep, changes in weight or appetite Skin: negative for changes in color, texture, moles or lesions Eyes: negative for changes in vision, pain, diplopia HEENT: negative for change in hearing, pain, discharge, tinnitus, vertigo, voice changes, sore throat, neck masses Breasts: negative for breast lumps Pulmonary: negative for dyspnea, orthopnea, productive cough Cardiac: negative for  palpitations, syncope, pain, discomfort, pressure Gastrointestinal: negative for dysphagia, nausea, vomiting, jaundice, pain, constipation, diarrhea, hematemesis, hematochezia Genitourinary/Sexual: negative for dysuria, discharge, hesitancy, nocturia, retention, stones, infections, STD's, incontinence Ob/Gyn: negative for irregular bleeding, pain Musculoskeletal: negative for pain, stiffness, swelling, range of motion limitation Hematology: negative for easy bruising, bleeding Neurologic/Psych: negative for headaches, seizures, paralysis, weakness, tremor, change in gait, change in sensation, mood swings, depression, anxiety, change in memory  Objective:  Physical Examination:  BP 113/70 (BP Location: Left Arm, Patient Position: Sitting)   Pulse 66   Temp 97.8 F (36.6 C) (Tympanic)   Resp 18   Ht 5\' 7"  (1.702 m)   Wt 176 lb 12.8 oz (80.2 kg)   SpO2 95%   BMI 27.69 kg/m     ECOG Performance Status: 0 - Asymptomatic  GENERAL: Patient is a well appearing female in no acute distress HEENT:  Sclerae anicteric.  Oropharynx clear and moist. No ulcerations or evidence of oropharyngeal candidiasis. Neck is supple.  NODES:  No cervical, supraclavicular, or axillary lymphadenopathy palpated.  LUNGS:  Clear to auscultation bilaterally.  No wheezes or rhonchi. HEART:  Regular rate and rhythm. No murmur appreciated. ABDOMEN:  Soft, nontender.  Positive, normoactive bowel sounds. No organomegaly palpated. MSK:  No focal spinal tenderness to palpation. Full range of motion bilaterally in the upper extremities. EXTREMITIES:  No peripheral edema.   SKIN:  Clear with no obvious rashes or skin changes. No nail dyscrasia. NEURO:  Nonfocal. Well oriented.  Appropriate affect. BREAST: Breasts appear normal, no suspicious masses, no skin or nipple changes or axillary nodes  Pelvic: Exam Chaperoned by NP EGBUS: no lesions Cervix: no lesions, nontender, mobile Vagina: no lesions, no discharge or  bleeding Uterus: normal size, nontender, mobile Adnexa: no palpable masses Rectovaginal: confirmatory    Assessment:  Katherine Snyder is a 67 y.o. 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 No evidence of metastatic disease on exam.   Medical co-morbidities complicating care: smoker.  Plan:   Problem List Items Addressed This Visit    None    Visit Diagnoses    Endometrial cancer ([place])    -  Primary   Relevant Orders   CT Chest W Contrast   CT Abdomen Pelvis W Contrast     We discussed options for management including CT scan C/A/P to rule out metastatic disease in view of aggressive histology.  If this was positive she may be a candidate for primary chemotherapy.  If negative then would recommend TLH/BSO, sentinel lymph node mapping and biopsies and aortic lymph node dissection.  In view of more extensive staging needed, would prefer to do this at [facility], but not sure if her insurance would cover this.  Will clarify insurance status while awaiting CT scan results and then will schedule surgery at either [facility] or [facility] as soon as possible. Discussed with patient and brother that this is an aggressive type of cancer, but prognosis and treatment will be clearer when we have more information from CT scan and then path report after surgery.    The patient's diagnosis, an outline of the further diagnostic and laboratory studies which will be required, the recommendation for surgery, and alternatives were discussed with her and her accompanying family members.  All questions were answered to their satisfaction.  A total of 60 minutes were spent with the patient/family today; 50% was spent in education, counseling and coordination of care for uterine carcinosarcoma.    Beckey Rutter, DNP, AGNP-C [place] at [facility] [phone] (work cell) [phone] (office) 03/28/18 9:35 AM  I personally interviewed and examined the patient. Agreed with the above/below plan of care.  Patient/family questions were answered.  Mellody Drown, MD  CC:  [place], [place] [address] [place], [place] [address] [phone]

## 2018-03-29 ENCOUNTER — Telehealth: Payer: Self-pay | Admitting: *Deleted

## 2018-03-29 ENCOUNTER — Other Ambulatory Visit: Payer: Self-pay | Admitting: Obstetrics and Gynecology

## 2018-03-29 NOTE — Telephone Encounter (Signed)
Called pt and let her know that Katherine Snyder the NP needs to go over consent and get it signed for her surgery. Then I will need to speak to her about pre op and post op instructions.  I requested that pt come over at 2 pm tom. And that will give [place] enough time to go over consent and she sign papers and then go over pre and post op info for surgery. She is agreeable to this and will be at cancer center.

## 2018-03-30 ENCOUNTER — Inpatient Hospital Stay (HOSPITAL_BASED_OUTPATIENT_CLINIC_OR_DEPARTMENT_OTHER): Payer: BLUE CROSS/BLUE SHIELD | Admitting: Nurse Practitioner

## 2018-03-30 ENCOUNTER — Ambulatory Visit
Admission: RE | Admit: 2018-03-30 | Discharge: 2018-03-30 | Disposition: A | Discharge status: Home / Self Care | Payer: BLUE CROSS/BLUE SHIELD | Source: Ambulatory Visit | Attending: Nurse Practitioner | Admitting: Nurse Practitioner

## 2018-03-30 ENCOUNTER — Encounter: Payer: Self-pay | Admitting: Nurse Practitioner

## 2018-03-30 VITALS — BP 106/68 | HR 68 | Temp 99.0°F | Resp 18 | Ht 67.0 in | Wt 177.4 lb

## 2018-03-30 DIAGNOSIS — C541 Malignant neoplasm of endometrium: Secondary | ICD-10-CM | POA: Diagnosis not present

## 2018-03-30 DIAGNOSIS — Z720 Tobacco use: Secondary | ICD-10-CM

## 2018-03-30 DIAGNOSIS — K7689 Other specified diseases of liver: Secondary | ICD-10-CM | POA: Diagnosis not present

## 2018-03-30 DIAGNOSIS — R918 Other nonspecific abnormal finding of lung field: Secondary | ICD-10-CM | POA: Insufficient documentation

## 2018-03-30 HISTORY — DX: Malignant (primary) neoplasm, unspecified: C80.1

## 2018-03-30 MED ORDER — IOPAMIDOL (ISOVUE-300) INJECTION 61%
100.0000 mL | Freq: Once | INTRAVENOUS | Status: AC | PRN
  Administered 2018-03-30: 100 mL via INTRAVENOUS

## 2018-03-30 NOTE — Progress Notes (Signed)
Gynecologic Oncology Interval Visit   Referring Provider: Dr. Amalia Hailey  Chief Complaint: Endometrial Cancer  Subjective:  Katherine Snyder is a 67 y.o. female who is seen for discussion and consent for surgery from Dr. Amalia Hailey for Endometrial Cancer (mixed mullerian tumor).   Initially, patient presented to ER on 02/26/18 for PMB. She was reportedly 14 years post menopausal and had had a 2 day episode of vaginal bleeding that was constant.   02/26/18- Ultrasound Pelvic Complete w/ Transvaginal: Endometrium thickness up to 87mm. No focal solid or cystic change. IMPRESSION: Mildly enlarged uterus. No discrete fibroids. Markedly thickened endometrium. In the setting of post-menopausal bleeding, endometrial sampling is indicated to exclude carcinoma. If results are benign, sonohysterogram should be considered for focal lesion work-up. (Ref: Radiological Reasoning: Algorithmic Workup of Abnormal Vaginal Bleeding with Endovaginal Sonography and Sonohysterography. AJR 2008; 144:R15-40) Nonvisualization of the right ovary. Normal-appearing left ovary. No significant uterine fibroids were observed.  She was seen by Dr. Ellard Artis son 03/01/18 who performed vacurette endometrial biopsy. Uterus was sounded to length to 9 cm. She was started on Aygestin and bleeding stopped.   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  COMMENT:  BASED ON INITIAL HISTOLOGIC FINDINGS, IMMUNOHISTOCHEMISTRY IS EVALUATED:  THE SARCOMATOUS TUMOR COMPONENT IS DIFFUSELY POSITIVE FOR CD10 AND THE CARCINOMA COMPONENT IS POSITIVE FOR PANKERATIN, SUPPORTING THE DIAGNOSIS.   She was seen by Dr. Fransisca Connors, Gyn-Onc and myself on 03/28/2018 for initial evaluation and treatment planning.  She has CT imaging scheduled for today to rule out metastatic disease.  TLH BSO with sentinel lymph node mapping and biopsies and aortic lymph node  dissection were discussed.  Surgery at [facility] was recommended but unfortunately, patient's insurance will not cover.  Discussed with Dr. Fransisca Connors and Dr. Theora Gianotti who has given okay for surgery at [facility] as soon as possible.  Patient presents today for surgical teaching and consent for surgery.  She reports feeling well.   Problem List: Patient Active Problem List   Diagnosis Date Noted  . Seborrhea 05/13/2016  . History of kidney stones 06/26/2015  . OP (osteoporosis) 06/26/2015  . Plantar fasciitis 06/26/2015  . Current tobacco use 06/26/2015  . Chronic airway obstruction ([place]) 10/08/2009  . Psoriasis 09/30/2008  . 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 Menopausal symptom 09/21/2007  . Tobacco use 09/21/2007  . Awareness of heartbeats 09/21/2007    Past Medical History: Past Medical History:  Diagnosis Date  . Endometrial mass   . History of chicken pox   . History of kidney stones   . History of measles   . History of mumps   . Osteoporosis   . PMB (postmenopausal bleeding)   . Psoriasis     Past Surgical History: Past Surgical History:  Procedure Laterality Date  . APPENDECTOMY  1960  . Injection varicose veins in legs  2010   Dr. Hulda Humphrey  . LITHOTRIPSY  1994, 2004   for renal stones  . TUBAL LIGATION  1980     OB History:  OB History  Gravida Para Term Preterm AB Living  1 1 1     1   SAB TAB Ectopic Multiple Live Births          1    # Outcome Date GA Lbr Len/2nd Weight Sex Delivery Anes PTL Lv  1 Term 34    M Vag-Spont   LIV  G1P1001 LMP:   Family History: Family History  Problem  Relation Age of Onset  . Hypertension Mother   . Breast cancer Mother   . Transient ischemic attack Mother   . Arthritis Mother   . Hypothyroidism Mother   . Lung cancer Mother   . Diabetes Father        type 2  . Hypertension Father   . Arthritis Father   . Hyperlipidemia Son   . Stomach cancer Maternal Grandmother   . Stroke Maternal Grandfather      Social History: Social History   Socioeconomic History  . Marital status: Single    Spouse name: Not on file  . Number of children: 1  . Years of education: Not on file  . Highest education level: Not on file  Occupational History  . Occupation: Employed    Comment: Works at a [employer] doing [employer]  Social Needs  . Financial resource strain: Not on file  . Food insecurity:    Worry: Not on file    Inability: Not on file  . Transportation needs:    Medical: Not on file    Non-medical: Not on file  Tobacco Use  . Smoking status: Current Every Day Smoker    Packs/day: 0.75    Types: Cigarettes  . Smokeless tobacco: Never Used  Substance and Sexual Activity  . Alcohol use: No    Alcohol/week: 0.0 oz  . Drug use: No  . Sexual activity: Not Currently  Lifestyle  . Physical activity:    Days per week: Not on file    Minutes per session: Not on file  . Stress: Not on file  Relationships  . Social connections:    Talks on phone: Not on file    Gets together: Not on file    Attends religious service: Not on file    Active member of club or organization: Not on file    Attends meetings of clubs or organizations: Not on file    Relationship status: Not on file  . Intimate partner violence:    Fear of current or ex partner: Not on file    Emotionally abused: Not on file    Physically abused: Not on file    Forced sexual activity: Not on file  Other Topics Concern  . Not on file  Social History Narrative  . Not on file    Allergies: No Known Allergies  Current Medications: Current Outpatient Medications  Medication Sig Dispense Refill  . betamethasone dipropionate (DIPROLENE) 0.05 % cream Apply topically 2 (two) times daily. As directed for psoriasis on body. (Patient taking differently: Apply 1 application topically 2 (two) times daily as needed (for psoriasis on body). ) 30 g 1  . desonide (DESOWEN) 0.05 % cream Apply once a day sparingly to seborrhea as  needed on face. (Patient taking differently: Apply 1 application topically 2 (two) times daily as needed (for psoriasis on face). ) 30 g 1  . Multiple Vitamins-Minerals (CENTRUM SILVER ADULT 50+ PO) Take 1 tablet by mouth daily.    . naproxen sodium (ALEVE) 220 MG tablet Take 440 mg by mouth daily as needed (for pain or headache).    . norethindrone (AYGESTIN) 5 MG tablet TAKE 2 TABLETS BY MOUTH EVERY DAY 60 tablet 0   No current facility-administered medications for this visit.    Facility-Administered Medications Ordered in Other Visits  Medication Dose Route Frequency Provider Last Rate Last Dose  . iopamidol (ISOVUE-300) 61 % injection 100 mL  100 mL Intravenous Once PRN Verlon Au,  NP       Review of Systems General:  no complaints Skin: no complaints Eyes: no complaints HEENT: no complaints Breasts: no complaints Pulmonary: no complaints Cardiac: no complaints Gastrointestinal: no complaints Genitourinary/Sexual: no complaints Ob/Gyn: no complaints Musculoskeletal: no complaints Hematology: no complaints Neurologic/Psych: no complaints  Objective:  Physical Examination:  BP 106/68   Pulse 68   Temp 99 F (37.2 C) (Oral)   Resp 18   Ht 5\' 7"  (1.702 m)   Wt 177 lb 6.4 oz (80.5 kg)   BMI 27.78 kg/m     ECOG Performance Status: 0 - Asymptomatic  GENERAL: Patient is a well appearing female in no acute distress HEENT:  Sclerae anicteric.  Oropharynx clear and moist. No ulcerations or evidence of oropharyngeal candidiasis. Neck is supple. Dentures NODES:  No cervical, supraclavicular, or axillary lymphadenopathy palpated.  LUNGS:  Clear to auscultation bilaterally.  No wheezes or rhonchi. Smoker HEART:  Regular rate and rhythm. No murmur appreciated. ABDOMEN:  Soft, nontender.  Positive, normoactive bowel sounds. No organomegaly palpated. MSK:  No focal spinal tenderness to palpation. Full range of motion bilaterally in the upper extremities. EXTREMITIES:  No  peripheral edema.   SKIN:  Clear with no obvious rashes or skin changes. No nail dyscrasia. NEURO:  Nonfocal. Well oriented.  Appropriate affect.      Assessment:  Ellaina Schuler is a 67 y.o. 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   Comorbidities complicating care: smoker,  Plan:   Problem List Items Addressed This Visit      Genitourinary   Endometrial cancer ([place]) - Primary     We again discussed the role of CT imaging is to rule out metastatic disease in view of aggressive histology.  Advised that if imaging is positive for metastatic disease, she would be candidate for primary chemotherapy followed by surgery.  However, if negative, we recommend proceeding with surgery, robotic assisted total hysterectomy with sentinel lymph node mapping and biopsies, and aortic lymph node dissection. Due to insurance requirements, [facility] surgery was not an option.  Have discussed case with Dr. Theora Gianotti and Dr. Fransisca Connors who okay surgery at [facility] if negative imaging.  Dr. Theora Gianotti and Dr. Leonides Schanz will perform the surgery on 04/04/2018.  Again reinforced that she has an aggressive type of cancer but prognosis and treatment will be clear once we have information from CT imaging and postoperative pathology.  Patient verbalizes understanding of risks and benefits and agrees to proceed with surgery.  Consent signed.  Surgical teaching performed nursing.  All questions answered.   Beckey Rutter, DNP, AGNP-C [place] at [facility] [phone] (work cell) [phone] (office) 03/30/18 4:04 PM    CC:  [place], [place], [place] [address] [phone]

## 2018-04-03 ENCOUNTER — Encounter
Admission: RE | Admit: 2018-04-03 | Discharge: 2018-04-03 | Disposition: A | Discharge status: Home / Self Care | Payer: BLUE CROSS/BLUE SHIELD | Source: Ambulatory Visit | Attending: Obstetrics and Gynecology | Admitting: Obstetrics and Gynecology

## 2018-04-03 ENCOUNTER — Other Ambulatory Visit: Payer: Self-pay

## 2018-04-03 ENCOUNTER — Encounter: Payer: Self-pay | Admitting: *Deleted

## 2018-04-03 DIAGNOSIS — Z01812 Encounter for preprocedural laboratory examination: Secondary | ICD-10-CM | POA: Insufficient documentation

## 2018-04-03 DIAGNOSIS — R001 Bradycardia, unspecified: Secondary | ICD-10-CM | POA: Diagnosis not present

## 2018-04-03 DIAGNOSIS — Z0183 Encounter for blood typing: Secondary | ICD-10-CM | POA: Insufficient documentation

## 2018-04-03 DIAGNOSIS — C541 Malignant neoplasm of endometrium: Secondary | ICD-10-CM

## 2018-04-03 DIAGNOSIS — R9431 Abnormal electrocardiogram [ECG] [EKG]: Secondary | ICD-10-CM | POA: Diagnosis not present

## 2018-04-03 DIAGNOSIS — Z01818 Encounter for other preprocedural examination: Secondary | ICD-10-CM | POA: Insufficient documentation

## 2018-04-03 HISTORY — DX: Unspecified osteoarthritis, unspecified site: M19.90

## 2018-04-03 LAB — COMPREHENSIVE METABOLIC PANEL
ALT: 13 U/L — ABNORMAL LOW (ref 14–54)
AST: 17 U/L (ref 15–41)
Albumin: 4 g/dL (ref 3.5–5.0)
Alkaline Phosphatase: 56 U/L (ref 38–126)
Anion gap: 10 (ref 5–15)
BUN: 15 mg/dL (ref 6–20)
CO2: 24 mmol/L (ref 22–32)
Calcium: 9.1 mg/dL (ref 8.9–10.3)
Chloride: 106 mmol/L (ref 101–111)
Creatinine, Ser: 0.75 mg/dL (ref 0.44–1.00)
GFR calc Af Amer: >60 mL/min (ref >60)
GFR calc non Af Amer: >60 mL/min (ref >60)
Glucose, Bld: 79 mg/dL (ref 65–99)
Potassium: 3.6 mmol/L (ref 3.5–5.1)
Sodium: 140 mmol/L (ref 135–145)
Total Bilirubin: 0.7 mg/dL (ref 0.3–1.2)
Total Protein: 7.3 g/dL (ref 6.5–8.1)

## 2018-04-03 LAB — CBC WITH DIFFERENTIAL/PLATELET
Basophils Absolute: 0.1 10*3/uL (ref 0–0.1)
Basophils Relative: 1 %
Eosinophils Absolute: 0.3 10*3/uL (ref 0–0.7)
Eosinophils Relative: 3 %
HCT: 42.8 % (ref 35.0–47.0)
Hemoglobin: 14.2 g/dL (ref 12.0–16.0)
Lymphocytes Relative: 30 %
Lymphs Abs: 2.4 10*3/uL (ref 1.0–3.6)
MCH: 30 pg (ref 26.0–34.0)
MCHC: 33.2 g/dL (ref 32.0–36.0)
MCV: 90.5 fL (ref 80.0–100.0)
Monocytes Absolute: 0.6 10*3/uL (ref 0.2–0.9)
Monocytes Relative: 8 %
Neutro Abs: 4.5 10*3/uL (ref 1.4–6.5)
Neutrophils Relative %: 58 %
Platelets: 211 10*3/uL (ref 150–440)
RBC: 4.72 MIL/uL (ref 3.80–5.20)
RDW: 13.4 % (ref 11.5–14.5)
WBC: 7.8 10*3/uL (ref 3.6–11.0)

## 2018-04-03 LAB — TYPE AND SCREEN
ABO/RH(D): A POS
Antibody Screen: NEGATIVE

## 2018-04-03 LAB — PROTIME-INR
INR: 1.02
Prothrombin Time: 13.3 seconds (ref 11.4–15.2)

## 2018-04-03 LAB — APTT: aPTT: 34 seconds (ref 24–36)

## 2018-04-03 MED ORDER — FLEET ENEMA 7-19 GM/118ML RE ENEM
1.0000 | ENEMA | Freq: Once | RECTAL | Status: DC
  Filled 2018-04-03: qty 1

## 2018-04-03 MED ORDER — CEFAZOLIN SODIUM-DEXTROSE 2-4 GM/100ML-% IV SOLN
2.0000 g | INTRAVENOUS | Status: AC
  Administered 2018-04-04: 2 g via INTRAVENOUS

## 2018-04-03 NOTE — Patient Instructions (Signed)
Your procedure is scheduled on: Apr 04, 2018 [facility] Report to Day Surgery on the 2nd floor of the [facility]. To find out your arrival time, please call [phone] between 1PM - 3PM on: Apr 03, 2018 TUESDAY  REMEMBER: Instructions that are not followed completely may result in serious medical risk, up to and including death; or upon the discretion of your surgeon and anesthesiologist your surgery may need to be rescheduled.  .  Refer to instructions from MD OFFICE  reguarding diet day before surgery   You may however, drink CLEAR liquids up to 2 hours before you are scheduled to arrive for your surgery. Do not drink anything within 2 hours of the start of your surgery.  Do NOT drink anything that is not on this list.    No Alcohol for 24 hours before or after surgery.  No Smoking including e-cigarettes for 24 hours prior to surgery.  No chewable tobacco products for at least 6 hours prior to surgery.  No nicotine patches on the day of surgery.  On the morning of surgery brush your teeth with toothpaste and water, you may rinse your mouth with mouthwash if you wish. Do not swallow any toothpaste or mouthwash.  Notify your doctor if there is any change in your medical condition (cold, fever, infection).  Do not wear jewelry, make-up, hairpins, clips or nail polish.  Do not wear lotions, powders, or perfumes. You may NOT wear deodorant.  Do not shave 48 hours prior to surgery. Men may shave face and neck.  Contacts and dentures may not be worn into surgery.  Do not bring valuables to the hospital, including drivers license, insurance or credit cards.  [facility] is not responsible for any belongings or valuables.   TAKE THESE MEDICATIONS THE MORNING OF SURGERY: None  Use CHG Soap  as directed on instruction sheet.  Stop Anti-inflammatories (NSAIDS) such as Advil, Aleve, Ibuprofen, Motrin, Naproxen, Naprosyn and Aspirin based products such as Excedrin, Goodys Powder,  BC Powder. (May take Tylenol or Acetaminophen if needed.)  Stop ANY OVER THE COUNTER supplements until after surgery. (May continue Vitamin D, Vitamin B, and multivitamin.)  Wear comfortable clothing (specific to your surgery type) to the hospital.  Plan for stool softeners for home use.  If you are being admitted to the hospital overnight, leave your suitcase in the car. After surgery it may be brought to your room.  If you are being discharged the day of surgery, you will not be allowed to drive home. You will need a responsible adult to drive you home and stay with you that night.   If you are taking public transportation, you will need to have a responsible adult with you. Please confirm with your physician that it is acceptable to use public transportation.   Please call [phone] if you have any questions about these instructions.

## 2018-04-04 ENCOUNTER — Other Ambulatory Visit: Payer: Self-pay

## 2018-04-04 ENCOUNTER — Ambulatory Visit: Payer: BLUE CROSS/BLUE SHIELD | Admitting: Anesthesiology

## 2018-04-04 ENCOUNTER — Encounter
Admission: RE | Disposition: A | Discharge status: Home / Self Care | Payer: Self-pay | Source: Ambulatory Visit | Attending: Obstetrics and Gynecology

## 2018-04-04 ENCOUNTER — Ambulatory Visit
Admission: RE | Admit: 2018-04-04 | Discharge: 2018-04-04 | Disposition: A | Discharge status: Home / Self Care | Payer: BLUE CROSS/BLUE SHIELD | Source: Ambulatory Visit | Attending: Obstetrics and Gynecology | Admitting: Obstetrics and Gynecology

## 2018-04-04 DIAGNOSIS — Z79899 Other long term (current) drug therapy: Secondary | ICD-10-CM | POA: Insufficient documentation

## 2018-04-04 DIAGNOSIS — N83321 Acquired atrophy of right fallopian tube: Secondary | ICD-10-CM | POA: Diagnosis not present

## 2018-04-04 DIAGNOSIS — M810 Age-related osteoporosis without current pathological fracture: Secondary | ICD-10-CM | POA: Insufficient documentation

## 2018-04-04 DIAGNOSIS — C542 Malignant neoplasm of myometrium: Secondary | ICD-10-CM | POA: Diagnosis not present

## 2018-04-04 DIAGNOSIS — C541 Malignant neoplasm of endometrium: Secondary | ICD-10-CM | POA: Diagnosis not present

## 2018-04-04 DIAGNOSIS — D250 Submucous leiomyoma of uterus: Secondary | ICD-10-CM | POA: Diagnosis not present

## 2018-04-04 DIAGNOSIS — N83291 Other ovarian cyst, right side: Secondary | ICD-10-CM | POA: Diagnosis not present

## 2018-04-04 DIAGNOSIS — L409 Psoriasis, unspecified: Secondary | ICD-10-CM | POA: Diagnosis not present

## 2018-04-04 DIAGNOSIS — N83292 Other ovarian cyst, left side: Secondary | ICD-10-CM | POA: Diagnosis not present

## 2018-04-04 DIAGNOSIS — F17210 Nicotine dependence, cigarettes, uncomplicated: Secondary | ICD-10-CM | POA: Insufficient documentation

## 2018-04-04 HISTORY — PX: ROBOTIC ASSISTED TOTAL HYSTERECTOMY: SHX6085

## 2018-04-04 SURGERY — ROBOTIC ASSISTED TOTAL HYSTERECTOMY
Anesthesia: General | Laterality: Bilateral

## 2018-04-04 MED ORDER — FAMOTIDINE 20 MG PO TABS
20.0000 mg | ORAL_TABLET | Freq: Once | ORAL | Status: AC
  Administered 2018-04-04: 20 mg via ORAL

## 2018-04-04 MED ORDER — PROPOFOL 10 MG/ML IV BOLUS
INTRAVENOUS | Status: DC | PRN
  Administered 2018-04-04: 150 mg via INTRAVENOUS

## 2018-04-04 MED ORDER — INDOCYANINE GREEN 25 MG IV SOLR
INTRAVENOUS | Status: AC
  Filled 2018-04-04: qty 25

## 2018-04-04 MED ORDER — FENTANYL CITRATE (PF) 250 MCG/5ML IJ SOLN
INTRAMUSCULAR | Status: AC
  Filled 2018-04-04: qty 5

## 2018-04-04 MED ORDER — ONDANSETRON HCL 4 MG/2ML IJ SOLN
INTRAMUSCULAR | Status: AC
  Filled 2018-04-04: qty 2

## 2018-04-04 MED ORDER — MIDAZOLAM HCL 2 MG/2ML IJ SOLN
INTRAMUSCULAR | Status: DC | PRN
  Administered 2018-04-04: 2 mg via INTRAVENOUS

## 2018-04-04 MED ORDER — GLYCOPYRROLATE 0.2 MG/ML IJ SOLN
INTRAMUSCULAR | Status: AC
  Filled 2018-04-04: qty 1

## 2018-04-04 MED ORDER — INDOCYANINE GREEN 25 MG IV SOLR
INTRAVENOUS | Status: DC | PRN
  Administered 2018-04-04: 25 mg via TOPICAL

## 2018-04-04 MED ORDER — PHENYLEPHRINE HCL 10 MG/ML IJ SOLN
INTRAMUSCULAR | Status: AC
  Filled 2018-04-04: qty 1

## 2018-04-04 MED ORDER — ACETAMINOPHEN 500 MG PO TABS
ORAL_TABLET | ORAL | Status: AC
  Administered 2018-04-04: 1000 mg via ORAL
  Filled 2018-04-04: qty 2

## 2018-04-04 MED ORDER — ROCURONIUM BROMIDE 50 MG/5ML IV SOLN
INTRAVENOUS | Status: AC
  Filled 2018-04-04: qty 1

## 2018-04-04 MED ORDER — FENTANYL CITRATE (PF) 100 MCG/2ML IJ SOLN
INTRAMUSCULAR | Status: DC | PRN
  Administered 2018-04-04 (×5): 50 ug via INTRAVENOUS

## 2018-04-04 MED ORDER — ACETAMINOPHEN 650 MG RE SUPP
650.0000 mg | RECTAL | Status: DC | PRN
  Filled 2018-04-04: qty 1

## 2018-04-04 MED ORDER — LIDOCAINE HCL (CARDIAC) PF 100 MG/5ML IV SOSY
PREFILLED_SYRINGE | INTRAVENOUS | Status: DC | PRN
  Administered 2018-04-04: 100 mg via INTRAVENOUS

## 2018-04-04 MED ORDER — HEPARIN SODIUM (PORCINE) 5000 UNIT/ML IJ SOLN
INTRAMUSCULAR | Status: AC
  Administered 2018-04-04: 5000 [IU] via SUBCUTANEOUS
  Filled 2018-04-04: qty 1

## 2018-04-04 MED ORDER — ACETAMINOPHEN 500 MG PO TABS
1000.0000 mg | ORAL_TABLET | ORAL | Status: AC
  Administered 2018-04-04: 1000 mg via ORAL

## 2018-04-04 MED ORDER — HEPARIN SODIUM (PORCINE) 5000 UNIT/ML IJ SOLN
5000.0000 [IU] | Freq: Once | INTRAMUSCULAR | Status: AC
  Administered 2018-04-04: 5000 [IU] via SUBCUTANEOUS

## 2018-04-04 MED ORDER — EPHEDRINE SULFATE 50 MG/ML IJ SOLN
INTRAMUSCULAR | Status: AC
  Filled 2018-04-04: qty 1

## 2018-04-04 MED ORDER — ONDANSETRON HCL 4 MG/2ML IJ SOLN
4.0000 mg | Freq: Once | INTRAMUSCULAR | Status: AC | PRN
  Administered 2018-04-04: 4 mg via INTRAVENOUS

## 2018-04-04 MED ORDER — KETOROLAC TROMETHAMINE 30 MG/ML IJ SOLN
30.0000 mg | Freq: Four times a day (QID) | INTRAMUSCULAR | Status: DC
  Administered 2018-04-04: 30 mg via INTRAVENOUS
  Filled 2018-04-04: qty 1

## 2018-04-04 MED ORDER — DEXAMETHASONE SODIUM PHOSPHATE 10 MG/ML IJ SOLN
INTRAMUSCULAR | Status: AC
  Filled 2018-04-04: qty 1

## 2018-04-04 MED ORDER — PHENYLEPHRINE HCL 10 MG/ML IJ SOLN
INTRAMUSCULAR | Status: DC | PRN
  Administered 2018-04-04 (×2): 100 ug via INTRAVENOUS

## 2018-04-04 MED ORDER — ACETAMINOPHEN 325 MG PO TABS: 650.0000 mg | ORAL_TABLET | ORAL | Status: DC | PRN

## 2018-04-04 MED ORDER — FENTANYL CITRATE (PF) 100 MCG/2ML IJ SOLN: 25.0000 ug | INTRAMUSCULAR | Status: DC | PRN

## 2018-04-04 MED ORDER — MORPHINE SULFATE (PF) 4 MG/ML IV SOLN: 1.0000 mg | INTRAVENOUS | Status: DC | PRN

## 2018-04-04 MED ORDER — CHLORHEXIDINE GLUCONATE CLOTH 2 % EX PADS: 6.0000 | MEDICATED_PAD | Freq: Once | CUTANEOUS | Status: DC

## 2018-04-04 MED ORDER — ONDANSETRON HCL 4 MG/2ML IJ SOLN
INTRAMUSCULAR | Status: DC | PRN
  Administered 2018-04-04: 4 mg via INTRAVENOUS

## 2018-04-04 MED ORDER — BUPIVACAINE HCL (PF) 0.5 % IJ SOLN
INTRAMUSCULAR | Status: AC
  Filled 2018-04-04: qty 30

## 2018-04-04 MED ORDER — MIDAZOLAM HCL 2 MG/2ML IJ SOLN
INTRAMUSCULAR | Status: AC
  Filled 2018-04-04: qty 2

## 2018-04-04 MED ORDER — BUPIVACAINE HCL 0.5 % IJ SOLN
INTRAMUSCULAR | Status: DC | PRN
  Administered 2018-04-04: 21 mL

## 2018-04-04 MED ORDER — FAMOTIDINE 20 MG PO TABS
ORAL_TABLET | ORAL | Status: AC
  Administered 2018-04-04: 20 mg via ORAL
  Filled 2018-04-04: qty 1

## 2018-04-04 MED ORDER — OXYCODONE HCL 5 MG PO TABS: 5.0000 mg | ORAL_TABLET | ORAL | 0 refills | Status: DC | PRN

## 2018-04-04 MED ORDER — IBUPROFEN 800 MG PO TABS: 800.0000 mg | ORAL_TABLET | Freq: Four times a day (QID) | ORAL | 1 refills | Status: DC

## 2018-04-04 MED ORDER — SUCCINYLCHOLINE CHLORIDE 20 MG/ML IJ SOLN
INTRAMUSCULAR | Status: AC
  Filled 2018-04-04: qty 1

## 2018-04-04 MED ORDER — SUGAMMADEX SODIUM 200 MG/2ML IV SOLN
INTRAVENOUS | Status: DC | PRN
  Administered 2018-04-04: 200 mg via INTRAVENOUS

## 2018-04-04 MED ORDER — LIDOCAINE HCL (PF) 2 % IJ SOLN
INTRAMUSCULAR | Status: AC
  Filled 2018-04-04: qty 10

## 2018-04-04 MED ORDER — ROCURONIUM BROMIDE 100 MG/10ML IV SOLN
INTRAVENOUS | Status: DC | PRN
  Administered 2018-04-04: 50 mg via INTRAVENOUS
  Administered 2018-04-04: 10 mg via INTRAVENOUS
  Administered 2018-04-04 (×2): 20 mg via INTRAVENOUS

## 2018-04-04 MED ORDER — PROPOFOL 10 MG/ML IV BOLUS
INTRAVENOUS | Status: AC
  Filled 2018-04-04: qty 20

## 2018-04-04 MED ORDER — KETOROLAC TROMETHAMINE 30 MG/ML IJ SOLN
INTRAMUSCULAR | Status: AC
  Administered 2018-04-04: 30 mg via INTRAVENOUS
  Filled 2018-04-04: qty 1

## 2018-04-04 MED ORDER — DEXAMETHASONE SODIUM PHOSPHATE 10 MG/ML IJ SOLN
INTRAMUSCULAR | Status: DC | PRN
  Administered 2018-04-04: 10 mg via INTRAVENOUS

## 2018-04-04 MED ORDER — EPHEDRINE SULFATE 50 MG/ML IJ SOLN
INTRAMUSCULAR | Status: DC | PRN
  Administered 2018-04-04 (×2): 10 mg via INTRAVENOUS

## 2018-04-04 MED ORDER — LACTATED RINGERS IV SOLN: INTRAVENOUS | Status: DC

## 2018-04-04 MED ORDER — CEFAZOLIN SODIUM-DEXTROSE 2-4 GM/100ML-% IV SOLN
INTRAVENOUS | Status: AC
  Filled 2018-04-04: qty 100

## 2018-04-04 MED ORDER — LACTATED RINGERS IV SOLN
INTRAVENOUS | Status: DC
  Administered 2018-04-04 (×2): via INTRAVENOUS

## 2018-04-04 MED ORDER — SODIUM CHLORIDE FLUSH 0.9 % IV SOLN
INTRAVENOUS | Status: AC
  Filled 2018-04-04: qty 10

## 2018-04-04 SURGICAL SUPPLY — 77 items
BAG URINE DRAINAGE (UROLOGICAL SUPPLIES) ×2 IMPLANT
BLADE SURG 11 STRL SS SAFETY (MISCELLANEOUS) ×2 IMPLANT
CANISTER SUCT 1200ML W/VALVE (MISCELLANEOUS) ×2 IMPLANT
CANNULA DILATOR 10 W/SLV (CANNULA) ×4 IMPLANT
CATH FOLEY 2WAY  5CC 16FR (CATHETERS) ×1
CATH URTH 16FR FL 2W BLN LF (CATHETERS) ×1 IMPLANT
CHLORAPREP W/TINT 26ML (MISCELLANEOUS) ×2 IMPLANT
CNTNR SPEC 2.5X3XGRAD LEK (MISCELLANEOUS) ×1
CONT SPEC 4OZ STER OR WHT (MISCELLANEOUS) ×1
CONTAINER SPEC 2.5X3XGRAD LEK (MISCELLANEOUS) ×1 IMPLANT
CORD BIP STRL DISP 12FT (MISCELLANEOUS) ×2 IMPLANT
CORD MONOPOLAR M/FML 12FT (MISCELLANEOUS) ×2 IMPLANT
COVER TIP SHEARS 8 DVNC (MISCELLANEOUS) ×3 IMPLANT
COVER TIP SHEARS 8MM DA VINCI (MISCELLANEOUS) ×3
DEFOGGER SCOPE WARMER CLEARIFY (MISCELLANEOUS) ×2 IMPLANT
DERMABOND ADVANCED (GAUZE/BANDAGES/DRESSINGS) ×1
DERMABOND ADVANCED .7 DNX12 (GAUZE/BANDAGES/DRESSINGS) ×1 IMPLANT
DRAPE LEGGINS SURG 28X43 STRL (DRAPES) ×2 IMPLANT
DRAPE SHEET LG 3/4 BI-LAMINATE (DRAPES) ×4 IMPLANT
DRESSING SURGICEL FIBRLLR 1X2 (HEMOSTASIS) ×3 IMPLANT
DRSG SURGICEL FIBRILLAR 1X2 (HEMOSTASIS) ×6
ELECT REM PT RETURN 9FT ADLT (ELECTROSURGICAL) ×2
ELECTRODE REM PT RTRN 9FT ADLT (ELECTROSURGICAL) ×1 IMPLANT
FILTER LAP SMOKE EVAC STRL (MISCELLANEOUS) ×2 IMPLANT
GLOVE BIO SURGEON STRL SZ 6.5 (GLOVE) ×14 IMPLANT
GLOVE INDICATOR 7.0 STRL GRN (GLOVE) ×14 IMPLANT
GOWN STRL REUS W/ TWL LRG LVL3 (GOWN DISPOSABLE) ×4 IMPLANT
GOWN STRL REUS W/TWL LRG LVL3 (GOWN DISPOSABLE) ×4
GRASPER SUT TROCAR 14GX15 (MISCELLANEOUS) ×2 IMPLANT
HANDLE YANKAUER SUCT BULB TIP (MISCELLANEOUS) ×2 IMPLANT
IRRIGATION STRYKERFLOW (MISCELLANEOUS) ×1 IMPLANT
IRRIGATOR STRYKERFLOW (MISCELLANEOUS) ×2
KIT ACCESSORY DA VINCI DISP (KITS) ×1
KIT ACCESSORY DVNC DISP (KITS) ×1 IMPLANT
KIT PINK PAD W/HEAD ARE REST (MISCELLANEOUS) ×2
KIT PINK PAD W/HEAD ARM REST (MISCELLANEOUS) ×1 IMPLANT
KIT TURNOVER CYSTO (KITS) ×2 IMPLANT
LABEL OR SOLS (LABEL) ×2 IMPLANT
MANIPULATOR VCARE LG CRV RETR (MISCELLANEOUS) IMPLANT
MANIPULATOR VCARE STD CRV RETR (MISCELLANEOUS) ×2 IMPLANT
NDL INSUFF 14G 150MM VS150000 (NEEDLE) ×2 IMPLANT
NDL INSUFF ACCESS 14 VERSASTEP (NEEDLE) ×2 IMPLANT
NEEDLE HYPO 22GX1.5 SAFETY (NEEDLE) ×4 IMPLANT
NS IRRIG 1000ML POUR BTL (IV SOLUTION) ×2 IMPLANT
NS IRRIG 500ML POUR BTL (IV SOLUTION) ×2 IMPLANT
OCCLUDER COLPOPNEUMO (BALLOONS) ×2 IMPLANT
PACK GYN LAPAROSCOPIC (MISCELLANEOUS) ×2 IMPLANT
PAD OB MATERNITY 4.3X12.25 (PERSONAL CARE ITEMS) ×2 IMPLANT
PAD PREP 24X41 OB/GYN DISP (PERSONAL CARE ITEMS) ×2 IMPLANT
PENCIL ELECTRO HAND CTR (MISCELLANEOUS) ×2 IMPLANT
PORT ACCESS TROCAR AIRSEAL 12 (TROCAR) ×1 IMPLANT
PORT ACCESS TROCAR AIRSEAL 5M (TROCAR) ×1
POUCH ENDO CATCH II 15MM (MISCELLANEOUS) ×2 IMPLANT
RETRACTOR GRASP SM DA VINCI (INSTRUMENTS) ×1
RETRACTOR GRASP SM DVNC (INSTRUMENTS) ×1 IMPLANT
SCISSORS METZENBAUM CVD 33 (INSTRUMENTS) ×2 IMPLANT
SET TRI-LUMEN FLTR TB AIRSEAL (TUBING) ×2 IMPLANT
SLEEVE VERSASTEP EXPAND ONEST (MISCELLANEOUS) ×4 IMPLANT
SOLUTION ELECTROLUBE (MISCELLANEOUS) ×2 IMPLANT
SUT DVC VLOC 180 0 12IN GS21 (SUTURE) ×2
SUT VIC AB 1 CT1 36 (SUTURE) ×4 IMPLANT
SUT VIC AB 2-0 CT1 27 (SUTURE)
SUT VIC AB 2-0 CT1 TAPERPNT 27 (SUTURE) IMPLANT
SUT VICRYL 0 AB UR-6 (SUTURE) ×2 IMPLANT
SUTURE DVC VLC 180 0 12IN GS21 (SUTURE) ×1 IMPLANT
SYR 3ML LL SCALE MARK (SYRINGE) ×2 IMPLANT
SYR 50ML LL SCALE MARK (SYRINGE) ×2 IMPLANT
SYR BULB IRRIG 60ML STRL (SYRINGE) ×2 IMPLANT
SYR CONTROL 10ML (SYRINGE) ×2 IMPLANT
SYRINGE IRR TOOMEY STRL 70CC (SYRINGE) ×2 IMPLANT
TAPE TRANSPORE STRL 2 31045 (GAUZE/BANDAGES/DRESSINGS) ×2 IMPLANT
TROCAR BALLN GELPORT 12X130M (ENDOMECHANICALS) ×2 IMPLANT
TROCAR DISP BLADELESS 8 DVNC (TROCAR) ×1 IMPLANT
TROCAR DISP BLADELESS 8MM (TROCAR) ×1
TROCAR VERSASTEP 12M LG PL (TROCAR) IMPLANT
TROCAR VERSASTEP PLUS 12MM (TROCAR) IMPLANT
TUBING INSUF HEATED (TUBING) ×2 IMPLANT

## 2018-04-04 NOTE — Anesthesia Post-op Follow-up Note (Signed)
Anesthesia QCDR form completed.        

## 2018-04-04 NOTE — Op Note (Addendum)
Operative Note   Date 04/04/2018   PRE-OP DIAGNOSIS: ENDOMETRIAL CANCER, GRADE 3 carcinosarcoma    POST-OP DIAGNOSIS: ENDOMETRIAL CANCER, GRADE 3 carcinosarcoma, staging pending final pathology  SURGEON: Surgeon(s) and Role: Chelsea Ward, MD  ASSISTANT:   Gillis Ends, MD  ANESTHESIA: General  PROCEDURE: Procedure(s): Robotic TLH, BSO, Sentinel node injection, mapping, and bilateral pelvic and para-aortic sentinel lymph node biopsies  ESTIMATED BLOOD LOSS: Minimal  DRAINS: Foley, removed postoperatively  TOTAL IV FLUIDS: 1000cc  SPECIMENS: Uterus, bilateral ovaries and tubes, cervix, washings, right primary sentinel obturator node; right secondary external iliac vein node, right secondary low para-aortic sentinel node, left primary sentinel low para-aortic node  COMPLICATIONS: None apparent  DISPOSITION: Stable to PACU  CONDITION: stable  INDICATIONS: Endometrial cancer, grade 3  FINDINGS: Exam under anesthesia revealed a 10-week mobile anteverted uterus. There were no adnexal masses or nodularity. The parametria was smooth. The cervix was negative for gross lesions. The uterus sounded to 11 cm. The Foley catheter was inserted and urine noted to be slightly cloudy - a urine culture was requested. Intraoperative findings included the uterus was enlarged to 10-12cm. The adnexa were normal bilaterally. The upper abdomen was normal including omentum, bowel, liver, stomach, and diaphragmatic surfaces. There was no evidence of grossly malignant left pelvic, or left or right para-aortic lymph nodes. The right obturator node and a right external mid iliac vein node were mildly enlarged. The sentinel node injection was successful with evidence of right and left primary sentinel nodes as identified above.    PROCEDURE IN DETAIL: After informed consent was obtained, the patient was taken to the operating room where anesthesia was obtained without difficulty. The patient was  positioned in the dorsal lithotomy position in [place] and her arms were carefully tucked at her sides and the usual precautions were taken.  She was prepped and draped in normal sterile fashion.  Time-out was performed and a Foley catheter was placed into the bladder and the cervix was infiltrated with 4 ml of ICG at 3 an 9 o'clock both superficial and deep injections. A standard VCare uterine manipulator was then placed in the uterus without incident.    An open Hasson technique was used to place an infraumbilical 16-XW baloon trocar under direct visualization. The laparoscope was introduced and CO2 gas was infused for pneumoperitoneum to a pressure of 15 mm Hg.  Right and left lateral 8-mm ports and a 5-12 mm LUQ port were placed under direct visualization of the laparoscope.The patient was placed in Trendelenburg and the bowel was displaced up into the upper abdomen. The daVinci Robot was assembled, with monopolar scissors and bipolar grasper. Cytologic washings were obtained.  Round ligaments were divided on each side with the EndoShears and the retroperitoneal space was opened bilaterally.  The ureters were identified and preserved.  At this point the retroperitoneal spaces were developed and the lymphatic channels were mapped to each side.  The primary sentinel node on the right side was then identified, skeletonized and removed taking care not to injure the  obturator nerve, the ureter or the pelvic vasculature. An additional right external vein node and secondary sentinel right low para-aortic artery node were removed.  Similarly on the left side, the retroperitoneal spaces were developed, the lymphatic channels mapped to identify the left low para-aortic sentinel node and they were similarly removed with care to preserve the the ureter and adjacent vasculature. With hemostasis secured, the infundibulopelvic ligaments were skeletonized, sealed and divided.  A bladder flap was  created and the  bladder was dissected down off the lower uterine segment and cervix using endoshears and electrocautery.  The uterine arteries were skeletonized bilaterally, sealed and divided.  A colpotomy was performed circumferentially along the V-Care ring with electrocautery and the cervix was incised from the vagina and the specimen was placed in an Endocatch bag. The specimen was removed through the vagina within the bag. The adnexa was removed from one side and placed off the sterile field. The uterus was too large to remove intact. A fundal incision was made and extruded intra-uterine tumor removed which collapsed the uterus and allowed for intact removal. There was no spillage of tumor outside of the vaginal bag. The perineum was irrigated with sterile water. A pneumo balloon was placed in the vagina and the vaginal cuff was then closed in a running continuous fashion with 0 V-Lock suture with careful attention to include the vaginal cuff angles and the vaginal mucosa within the closure.   Hemostasis was observed. The intraperitoneal pressure was dropped, and all planes of dissection, vascular pedicles and the vaginal cuff were found to be hemostatic. The pelvis was irrigated with sterile water. Fibillar was placed in the nodal basins.  The LUQ trocar was removed and the fascia was closed with 0 Vicryl suture using the Endoclose technique. The lateral trocars were removed under visualization.   Before the umbilical trocar was removed the CO2 gas was released.  The fascia there was closed with 0 Vicryl suture in running technique.   All skin incisions were closed with sudermal stitches and covered in surgical glue.  The vagina was inspected and irrigated with sterile water. The cuff was intact with sutures. There was a hemostatic abrasion noted on the introitus at 7 o';clock. The patient tolerated the procedure well.  Sponge, lap and needle counts were correct x2.  The patient was taken to recovery room in excellent  condition.  Antibiotics: Given Ancef, 2g, Antibiotics given within 1 hour of the start of the procedure, Antibiotics  discontinued after procedure. The case did not reach threshold for repeat dosing.   VTE prophylaxis: was ordered perioperatively including subcutaneous heparin and SCDs.  Dr. Santiago Glad performed the sentinel lymph node injection, mapping, and nodal biopsies.  Dr. Vikki Ports Ward performed the hysterectomy, BSO procedure.      Gillis Ends, MD WARD, Juliane Lack, MD

## 2018-04-04 NOTE — Transfer of Care (Signed)
Immediate Anesthesia Transfer of Care Note  Patient: Everrett Coombe  Procedure(s) Performed: Procedure(s): ROBOTIC ASSISTED TOTAL HYSTERECTOMY,SENTINEL LYMPH NODE MAPPING AND BIOPSIES,AORTIC LYMPH NODE DISSECTION (Bilateral)  Patient Location: PACU  Anesthesia Type:General  Level of Consciousness: sedated  Airway & Oxygen Therapy: Patient Spontanous Breathing and Patient connected to face mask oxygen  Post-op Assessment: Report given to RN and Post -op Vital signs reviewed and stable  Post vital signs: Reviewed and stable  Last Vitals:  Vitals:   04/04/18 0612 04/04/18 1149  BP: 121/73 135/75  Pulse: 61 70  Resp: 16 14  Temp: 36.5 C (!) 36.3 C  SpO2: 51% 460%    Complications: No apparent anesthesia complications

## 2018-04-04 NOTE — Anesthesia Preprocedure Evaluation (Addendum)
Anesthesia Evaluation  Patient identified by MRN, date of birth, ID band Patient awake    Reviewed: Allergy & Precautions, NPO status , Patient's Chart, lab work & pertinent test results  Airway Mallampati: II  TM Distance: <3 FB     Dental  (+) Upper Dentures   Pulmonary Current Smoker,    Pulmonary exam normal        Cardiovascular negative cardio ROS Normal cardiovascular exam     Neuro/Psych negative neurological ROS  negative psych ROS   GI/Hepatic Neg liver ROS,   Endo/Other  negative endocrine ROS  Renal/GU negative Renal ROS  Female GU complaint     Musculoskeletal  (+) Arthritis ,   Abdominal Normal abdominal exam  (+)   Peds negative pediatric ROS (+)  Hematology negative hematology ROS (+)   Anesthesia Other Findings   Reproductive/Obstetrics                            Anesthesia Physical Anesthesia Plan  ASA: II  Anesthesia Plan: General   Post-op Pain Management:    Induction: Intravenous  PONV Risk Score and Plan:   Airway Management Planned: Oral ETT  Additional Equipment:   Intra-op Plan:   Post-operative Plan: Extubation in OR  Informed Consent: I have reviewed the patients History and Physical, chart, labs and discussed the procedure including the risks, benefits and alternatives for the proposed anesthesia with the patient or authorized representative who has indicated his/her understanding and acceptance.   Dental advisory given  Plan Discussed with: CRNA and Surgeon  Anesthesia Plan Comments:         Anesthesia Quick Evaluation

## 2018-04-04 NOTE — Anesthesia Procedure Notes (Signed)
Procedure Name: Intubation Date/Time: 04/04/2018 7:48 AM Performed by: Doreen Salvage, CRNA Pre-anesthesia Checklist: Patient identified, Patient being monitored, Emergency Drugs available and Suction available Patient Re-evaluated:Patient Re-evaluated prior to induction Oxygen Delivery Method: Circle system utilized Preoxygenation: Pre-oxygenation with 100% oxygen Induction Type: IV induction Ventilation: Mask ventilation without difficulty and Oral airway inserted - appropriate to patient size Laryngoscope Size: Mac and 3 Grade View: Grade I Tube type: Oral Tube size: 7.0 mm Number of attempts: 1 Airway Equipment and Method: Stylet Placement Confirmation: ETT inserted through vocal cords under direct vision,  positive ETCO2 and breath sounds checked- equal and bilateral Secured at: 22 (at the lip) cm Tube secured with: Tape Dental Injury: Teeth and Oropharynx as per pre-operative assessment

## 2018-04-04 NOTE — Discharge Instructions (Signed)
Discharge instructions:  °Call office if you have any of the following: fever >101 F, chills, excessive vaginal bleeding, incision drainage or problems, leg pain or redness, or any other concerns.  ° °Activity: Do not lift > 10 lbs for 8 weeks.  °No intercourse or tampons for 8 weeks.  °No driving for 1-2 weeks.  ° °You may feel some pain in your upper right abdomen/rib and right shoulder.  This is from the gas in the abdomen for surgery. This will subside over time, please be patient! ° °Take 600mg Ibuprofen and 1000mg Tylenol around the clock, every 6 hours for at least the first 3-5 days.  After this you can take as needed.  This will help decrease inflammation and promote healing.  The narcotics you'll take just as needed, as they just trick your brain into thinking its not in pain.   ° °Please don't limit yourself in terms of routine activity.  You will be able to do most things, although they may take longer to do or be a little painful.  You can do it! ° °Don't be a hero, but don't be a wimp either!  ° ° °AMBULATORY SURGERY  °DISCHARGE INSTRUCTIONS ° ° °1) The drugs that you were given will stay in your system until tomorrow so for the next 24 hours you should not: ° °A) Drive an automobile °B) Make any legal decisions °C) Drink any alcoholic beverage ° ° °2) You may resume regular meals tomorrow.  Today it is better to start with liquids and gradually work up to solid foods. ° °You may eat anything you prefer, but it is better to start with liquids, then soup and crackers, and gradually work up to solid foods. ° ° °3) Please notify your doctor immediately if you have any unusual bleeding, trouble breathing, redness and pain at the surgery site, drainage, fever, or pain not relieved by medication. ° ° ° °4) Additional Instructions: ° ° ° ° ° ° ° °Please contact your physician with any problems or Same Day Surgery at [phone], Monday through Friday 6 am to 4 pm, or [place] at [place] Main number at  [phone]. ° °

## 2018-04-04 NOTE — Interval H&P Note (Signed)
History and Physical Interval Note:  04/04/2018 7:23 AM  Katherine Snyder  has presented today for surgery, with the diagnosis of ENDOMETRIAL CANCER  The various methods of treatment have been discussed with the patient and family. After consideration of risks, benefits and other options for treatment, the patient has consented to  Procedure(s): ROBOTIC ASSISTED TOTAL HYSTERECTOMY,SENTINEL LYMPH NODE MAPPING AND BIOPSIES,AORTIC LYMPH NODE DISSECTION (Bilateral) as a surgical intervention .  The patient's history has been reviewed, patient examined, no change in status, stable for surgery.  I have reviewed the patient's chart and labs.  Questions were answered to the patient's satisfaction.     SECORD,ANGELES ALVAREZ

## 2018-04-04 NOTE — Anesthesia Postprocedure Evaluation (Signed)
Anesthesia Post Note  Patient: Katherine Snyder  Procedure(s) Performed: ROBOTIC ASSISTED TOTAL HYSTERECTOMY,SENTINEL LYMPH NODE MAPPING AND BIOPSIES,AORTIC LYMPH NODE DISSECTION (Bilateral )  Patient location during evaluation: PACU Anesthesia Type: General Level of consciousness: oriented and sedated Pain management: pain level controlled Vital Signs Assessment: post-procedure vital signs reviewed and stable Respiratory status: spontaneous breathing Cardiovascular status: blood pressure returned to baseline Anesthetic complications: no     Last Vitals:  Vitals:   04/04/18 1149 04/04/18 1153  BP: 135/75   Pulse: 70 73  Resp: 14 19  Temp: (!) 36.3 C   SpO2: 100% 100%    Last Pain:  Vitals:   04/04/18 1153  TempSrc:   PainSc: Asleep                 Leo Weyandt

## 2018-04-05 ENCOUNTER — Encounter: Payer: Self-pay | Admitting: Obstetrics and Gynecology

## 2018-04-05 ENCOUNTER — Telehealth: Payer: Self-pay

## 2018-04-05 LAB — URINE CULTURE: Culture: NO GROWTH

## 2018-04-05 NOTE — Telephone Encounter (Signed)
Pt returning call

## 2018-04-05 NOTE — Telephone Encounter (Signed)
Attempted to contact patient. No answer and unable to leave a message voicemail is not set up.

## 2018-04-05 NOTE — Telephone Encounter (Signed)
-----   Message from Margo Common, [place] sent at 04/05/2018 11:25 AM EDT ----- Urine culture negative for infection. Follow up with surgeon as planned. Drink plenty of fluids.

## 2018-04-06 LAB — CYTOLOGY - NON PAP

## 2018-04-06 NOTE — Telephone Encounter (Signed)
Patient advised.

## 2018-04-11 ENCOUNTER — Telehealth: Payer: Self-pay

## 2018-04-11 NOTE — Telephone Encounter (Signed)
Call placed to Ms. Hansson to discuss pathology with Dr. Theora Gianotti. No answer, no voicemail. Will continue to attempt to contact. Oncology Nurse Navigator Documentation  Navigator Location: [facility] (04/11/18 1500)   )Navigator Encounter Type: Telephone;Diagnostic Results (04/11/18 1500) Telephone: Day Call (04/11/18 1500)                                                  Time Spent with Patient: 15 (04/11/18 1500)

## 2018-04-12 ENCOUNTER — Telehealth: Payer: Self-pay

## 2018-04-12 DIAGNOSIS — C541 Malignant neoplasm of endometrium: Secondary | ICD-10-CM

## 2018-04-12 NOTE — Progress Notes (Signed)
Request for MSI on [id number] sent to pathology Oncology Nurse Navigator Documentation  Navigator Location: [facility] (04/12/18 1100)   )                                                     Time Spent with Patient: 15 (04/12/18 1100)    

## 2018-04-12 NOTE — Telephone Encounter (Signed)
Per Dr. Theora Gianotti called and went over pathology and need for referrals to medical and radiation oncology. All questions answered. Referral orders placed. Oncology Nurse Navigator Documentation  Navigator Location: [facility] (04/12/18 1000)   )Navigator Encounter Type: Telephone;Diagnostic Results (04/12/18 1000) Telephone: [place] Call (04/12/18 1000)                                                  Time Spent with Patient: 30 (04/12/18 1000)

## 2018-04-13 LAB — SURGICAL PATHOLOGY

## 2018-04-16 ENCOUNTER — Ambulatory Visit
Admission: RE | Admit: 2018-04-16 | Discharge: 2018-04-16 | Disposition: A | Discharge status: Home / Self Care | Payer: BLUE CROSS/BLUE SHIELD | Source: Ambulatory Visit | Attending: Radiation Oncology | Admitting: Radiation Oncology

## 2018-04-16 ENCOUNTER — Encounter: Payer: Self-pay | Admitting: Radiation Oncology

## 2018-04-16 ENCOUNTER — Other Ambulatory Visit: Payer: Self-pay

## 2018-04-16 VITALS — BP 130/80 | HR 78 | Temp 98.6°F | Resp 18 | Wt 175.4 lb

## 2018-04-16 DIAGNOSIS — Z800 Family history of malignant neoplasm of digestive organs: Secondary | ICD-10-CM | POA: Diagnosis not present

## 2018-04-16 DIAGNOSIS — M810 Age-related osteoporosis without current pathological fracture: Secondary | ICD-10-CM | POA: Insufficient documentation

## 2018-04-16 DIAGNOSIS — F17210 Nicotine dependence, cigarettes, uncomplicated: Secondary | ICD-10-CM | POA: Insufficient documentation

## 2018-04-16 DIAGNOSIS — C541 Malignant neoplasm of endometrium: Secondary | ICD-10-CM

## 2018-04-16 DIAGNOSIS — Z79899 Other long term (current) drug therapy: Secondary | ICD-10-CM | POA: Diagnosis not present

## 2018-04-16 DIAGNOSIS — L409 Psoriasis, unspecified: Secondary | ICD-10-CM | POA: Insufficient documentation

## 2018-04-16 DIAGNOSIS — Z90710 Acquired absence of both cervix and uterus: Secondary | ICD-10-CM | POA: Insufficient documentation

## 2018-04-16 DIAGNOSIS — Z87442 Personal history of urinary calculi: Secondary | ICD-10-CM | POA: Diagnosis not present

## 2018-04-16 DIAGNOSIS — M129 Arthropathy, unspecified: Secondary | ICD-10-CM | POA: Insufficient documentation

## 2018-04-16 DIAGNOSIS — Z90722 Acquired absence of ovaries, bilateral: Secondary | ICD-10-CM | POA: Diagnosis not present

## 2018-04-16 DIAGNOSIS — Z801 Family history of malignant neoplasm of trachea, bronchus and lung: Secondary | ICD-10-CM | POA: Insufficient documentation

## 2018-04-16 DIAGNOSIS — Z803 Family history of malignant neoplasm of breast: Secondary | ICD-10-CM | POA: Diagnosis not present

## 2018-04-16 NOTE — Consult Note (Signed)
NEW PATIENT EVALUATION  Name: Katherine Snyder  MRN: [id number]  Date:   04/16/2018     DOB: 07-05-1951   This 67 y.o. female patient presents to the clinic for initial evaluation of stage I carcinosarcoma of the uterusstatus post robotic-assisted total hysterectomy and pelvic lymph node sampling.  REFERRING PHYSICIAN: Chrismon, Vickki Muff, PA  CHIEF COMPLAINT:  Chief Complaint  Patient presents with  . Cancer    Initial Evaluation    DIAGNOSIS: The encounter diagnosis was Endometrial cancer ([place]).   PREVIOUS INVESTIGATIONS:  Pathology reports reviewed CT scans reviewed Clinical notes reviewed Case presented at GYN tumor board  HPI: patient is a 67 year old female who presented with postmenopausal bleeding.she was seen by gynecology underwent biopsy of the endometrium showing poorly differentiated malignant neoplasm with features consistent with malignant mixed mullerian tumor I.e. Carcinosarcoma. CT scan showed masslike expansion endometrial cavity. There is no evidence of nodal metastasis.she did have multiple small pulmonary nodules nonspecific.She went on to have a robotic-assisted hysterectomy with sentinel lymph node sampling of both pelvic and perirectal lymph nodes.tumor again was consistent with carcinosarcoma myometrial invasion was present less than half the myometrium. There was no cervical stromal involvement.all lymph nodes sampled both pelvic and para-aortic were negative for metastatic disease.tumor was a FIGO stage IA. Patient has done well postoperatively has minimal vaginal bleeding at this time. No disruption of her bowel function. She is slated to see medical oncology later this week she is now referred to radiation oncology for opinion.  PLANNED TREATMENT REGIMEN: to be determined  PAST MEDICAL HISTORY:  has a past medical history of Arthritis, Cancer ([place]), Endometrial mass, History of chicken pox, History of kidney stones, History of measles, History of mumps,  Osteoporosis, PMB (postmenopausal bleeding), and Psoriasis.    PAST SURGICAL HISTORY:  Past Surgical History:  Procedure Laterality Date  . APPENDECTOMY  1960  . BREAST SURGERY Left    biopsy  . Injection varicose veins in legs Bilateral 2010   Dr. Hulda Humphrey  . LITHOTRIPSY  1994, 2004   for renal stones  . ROBOTIC ASSISTED TOTAL HYSTERECTOMY Bilateral 04/04/2018   Procedure: ROBOTIC ASSISTED TOTAL HYSTERECTOMY,SENTINEL LYMPH NODE MAPPING AND BIOPSIES,AORTIC LYMPH NODE DISSECTION;  Surgeon: Gillis Ends, MD;  Location: [facility] ORS;  Service: Gynecology;  Laterality: Bilateral;  . TUBAL LIGATION  1980    FAMILY HISTORY: family history includes Arthritis in her father and mother; Breast cancer in her mother; Diabetes in her father; Hyperlipidemia in her son; Hypertension in her father and mother; Hypothyroidism in her mother; Lung cancer in her mother; Stomach cancer in her maternal grandmother; Stroke in her maternal grandfather; Transient ischemic attack in her mother.  SOCIAL HISTORY:  reports that she has been smoking cigarettes.  She has been smoking about 0.50 packs per day. She has never used smokeless tobacco. She reports that she does not drink alcohol or use drugs.  ALLERGIES: Patient has no known allergies.  MEDICATIONS:  Current Outpatient Medications  Medication Sig Dispense Refill  . betamethasone dipropionate (DIPROLENE) 0.05 % cream Apply topically 2 (two) times daily. As directed for psoriasis on body. (Patient taking differently: Apply 1 application topically 2 (two) times daily as needed (for psoriasis on body). ) 30 g 1  . desonide (DESOWEN) 0.05 % cream Apply once a day sparingly to seborrhea as needed on face. (Patient taking differently: Apply 1 application topically 2 (two) times daily as needed (for psoriasis on face). ) 30 g 1  . ibuprofen (ADVIL,MOTRIN) 800 MG tablet  Take 1 tablet (800 mg total) by mouth every 6 (six) hours. 65 tablet 1  . Multiple  Vitamins-Minerals (CENTRUM SILVER ADULT 50+ PO) Take 1 tablet by mouth daily.    Marland Kitchen oxyCODONE (ROXICODONE) 5 MG immediate release tablet Take 1 tablet (5 mg total) by mouth every 4 (four) hours as needed. 16 tablet 0   No current facility-administered medications for this encounter.     ECOG PERFORMANCE STATUS:  0 - Asymptomatic  REVIEW OF SYSTEMS:  Patient denies any weight loss, fatigue, weakness, fever, chills or night sweats. Patient denies any loss of vision, blurred vision. Patient denies any ringing  of the ears or hearing loss. No irregular heartbeat. Patient denies heart murmur or history of fainting. Patient denies any chest pain or pain radiating to her upper extremities. Patient denies any shortness of breath, difficulty breathing at night, cough or hemoptysis. Patient denies any swelling in the lower legs. Patient denies any nausea vomiting, vomiting of blood, or coffee ground material in the vomitus. Patient denies any stomach pain. Patient states has had normal bowel movements no significant constipation or diarrhea. Patient denies any dysuria, hematuria or significant nocturia. Patient denies any problems walking, swelling in the joints or loss of balance. Patient denies any skin changes, loss of hair or loss of weight. Patient denies any excessive worrying or anxiety or significant depression. Patient denies any problems with insomnia. Patient denies excessive thirst, polyuria, polydipsia. Patient denies any swollen glands, patient denies easy bruising or easy bleeding. Patient denies any recent infections, allergies or URI. Patient "s visual fields have not changed significantly in recent time.    PHYSICAL EXAM: BP 130/80   Pulse 78   Temp 98.6 F (37 C)   Resp 18   Wt 175 lb 6 oz (79.5 kg)   BMI 27.47 kg/m  Well-developed well-nourished patient in NAD. HEENT reveals PERLA, EOMI, discs not visualized.  Oral cavity is clear. No oral mucosal lesions are identified. Neck is clear  without evidence of cervical or supraclavicular adenopathy. Lungs are clear to A&P. Cardiac examination is essentially unremarkable with regular rate and rhythm without murmur rub or thrill. Abdomen is benign with no organomegaly or masses noted. Motor sensory and DTR levels are equal and symmetric in the upper and lower extremities. Cranial nerves II through XII are grossly intact. Proprioception is intact. No peripheral adenopathy or edema is identified. No motor or sensory levels are noted. Crude visual fields are within normal range.  LABORATORY DATA: pathology reports reviewed    RADIOLOGY RESULTS:CT scans of chest abdomen and pelvis all reviewed and compatible with the above-stated findings   IMPRESSION: stage IA carcinosarcoma of the uterus status post robotic-assisted hysterectomy and pelvic lymph node sampling in 67 year old female  PLAN: this time case will be presented and discussed at our GYN oncology conference. Recommendations for carcinosarcoma stage IA range from observation to adjuvant chemotherapy plus or minus vaginal brachytherapy. I have briefly discussed treatment recommendations with the patient although I would like medical oncology's opinion as well as our tumor board discussion of her case. Radiation therapy in this stage of disease is not been shown to increase overall survival although it does have an effect on local regional control of her disease. I've told the patient we will get back to her after opinion has been rendered.  I would like to take this opportunity to thank you for allowing me to participate in the care of your patient.Noreene Filbert, MD

## 2018-04-16 NOTE — Progress Notes (Signed)
Reviewed pathology briefly as well as MMR and ER/PR. Provided copy of pathology.She will be discussed at case conference 6/12. Notified of appointment with Dr. Tasia Catchings for consult 6/12 at 1115a. Referral placed to PSN for financial concerns with treatment. Oncology Nurse Navigator Documentation  Navigator Location: [facility] (04/16/18 1000)   )Navigator Encounter Type: Initial RadOnc (04/16/18 1000) Telephone: Education (04/16/18 1000)                   Patient Visit Type: RadOnc;Initial (04/16/18 1000)   Barriers/Navigation Needs: Education;Financial (04/16/18 1000)   Interventions: Referrals;Coordination of Care (04/16/18 1000) Referrals: Social Work (04/16/18 1000) Coordination of Care: Appts (04/16/18 1000)                  Time Spent with Patient: 30 (04/16/18 1000)

## 2018-04-18 ENCOUNTER — Other Ambulatory Visit: Payer: Self-pay

## 2018-04-18 ENCOUNTER — Encounter: Payer: Self-pay | Admitting: Oncology

## 2018-04-18 ENCOUNTER — Inpatient Hospital Stay: Payer: BLUE CROSS/BLUE SHIELD | Attending: Oncology | Admitting: Oncology

## 2018-04-18 VITALS — BP 128/77 | HR 66 | Temp 97.5°F | Resp 18 | Ht 68.0 in | Wt 176.3 lb

## 2018-04-18 DIAGNOSIS — L309 Dermatitis, unspecified: Secondary | ICD-10-CM | POA: Insufficient documentation

## 2018-04-18 DIAGNOSIS — Z7189 Other specified counseling: Secondary | ICD-10-CM | POA: Diagnosis not present

## 2018-04-18 DIAGNOSIS — Z8042 Family history of malignant neoplasm of prostate: Secondary | ICD-10-CM | POA: Diagnosis not present

## 2018-04-18 DIAGNOSIS — C541 Malignant neoplasm of endometrium: Secondary | ICD-10-CM | POA: Diagnosis not present

## 2018-04-18 DIAGNOSIS — Z5111 Encounter for antineoplastic chemotherapy: Secondary | ICD-10-CM | POA: Insufficient documentation

## 2018-04-18 DIAGNOSIS — Z803 Family history of malignant neoplasm of breast: Secondary | ICD-10-CM | POA: Diagnosis not present

## 2018-04-18 DIAGNOSIS — R682 Dry mouth, unspecified: Secondary | ICD-10-CM | POA: Insufficient documentation

## 2018-04-18 DIAGNOSIS — Z90722 Acquired absence of ovaries, bilateral: Secondary | ICD-10-CM | POA: Insufficient documentation

## 2018-04-18 DIAGNOSIS — K117 Disturbances of salivary secretion: Secondary | ICD-10-CM | POA: Insufficient documentation

## 2018-04-18 DIAGNOSIS — Z800 Family history of malignant neoplasm of digestive organs: Secondary | ICD-10-CM

## 2018-04-18 DIAGNOSIS — G8918 Other acute postprocedural pain: Secondary | ICD-10-CM | POA: Insufficient documentation

## 2018-04-18 DIAGNOSIS — Z90710 Acquired absence of both cervix and uterus: Secondary | ICD-10-CM | POA: Insufficient documentation

## 2018-04-18 DIAGNOSIS — Z801 Family history of malignant neoplasm of trachea, bronchus and lung: Secondary | ICD-10-CM

## 2018-04-18 DIAGNOSIS — G629 Polyneuropathy, unspecified: Secondary | ICD-10-CM | POA: Insufficient documentation

## 2018-04-18 DIAGNOSIS — Z720 Tobacco use: Secondary | ICD-10-CM | POA: Diagnosis not present

## 2018-04-18 DIAGNOSIS — R309 Painful micturition, unspecified: Secondary | ICD-10-CM | POA: Insufficient documentation

## 2018-04-18 MED ORDER — LIDOCAINE-PRILOCAINE 2.5-2.5 % EX CREA: TOPICAL_CREAM | CUTANEOUS | 3 refills | Status: DC

## 2018-04-18 MED ORDER — DEXAMETHASONE 4 MG PO TABS: 8.0000 mg | ORAL_TABLET | Freq: Every day | ORAL | 1 refills | Status: DC

## 2018-04-18 MED ORDER — PROCHLORPERAZINE MALEATE 10 MG PO TABS
10.0000 mg | ORAL_TABLET | Freq: Four times a day (QID) | ORAL | 1 refills | Status: DC | PRN
Start: 2018-04-18 — End: 2019-03-13

## 2018-04-18 MED ORDER — ONDANSETRON HCL 8 MG PO TABS: 8.0000 mg | ORAL_TABLET | Freq: Two times a day (BID) | ORAL | 1 refills | Status: DC | PRN

## 2018-04-18 NOTE — Progress Notes (Signed)
START ON PATHWAY REGIMEN - Uterine     A cycle is every 21 days:     Paclitaxel      Carboplatin   **Always confirm dose/schedule in your pharmacy ordering system**  Patient Characteristics: Carcinosarcomas, Newly Diagnosed, Resected Histology: Carcinosarcoma Therapeutic Status: Newly Diagnosed AJCC T Category: TX AJCC N Category: NX AJCC M Category: M0 AJCC 8 Stage Grouping: IA Surgical Status: Resected Intent of Therapy: Curative Intent, Discussed with Patient

## 2018-04-18 NOTE — Progress Notes (Signed)
Patient here today as new patient.    

## 2018-04-18 NOTE — Progress Notes (Signed)
Hematology/Oncology Consult note [facility] [phone] [phone]   Patient Care Team: Chrismon, Vickki Muff, PA as PCP - General (Family Medicine)  REFERRING PROVIDER: Dr. 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 Pathology showed  A uterus with cervix hysterectomy -Carcinosarcoma B sentinel lymph node, right primary obturator negative C lymph node right external iliac lymph negative D fallopian tube and ovary, unilateral right negative E sentinel lymph node right low periaortic: Negative F sentinel lymph node left low periaortic negative  CANCER CASE SUMMARY: ENDOMETRIUM  Procedure: Total hysterectomy and bilateral salpingo-oophorectomy  Histologic Type: Carcinosarcoma  Histologic Grade: Not applicable  Myometrial Invasion: Present    Depth of myometrial invasion cannot be determined due to exophytic  tumor growth and effacement of endometrial/myometrial junction    Myometrial thickness: At least 8 mm    Percentage depth of myometrial invasion: Estimated less than 50%  Uterine Serosa Involvement: Not identified  Cervical Stromal Involvement: Not identified  Other Tissue/Organ Involvement: Not identified  Peritoneal/ Ascitic Fluid: Negative for malignancy  Lymphovascular Invasion: Present  Regional Lymph Nodes: All lymph nodes negative for tumor cells  Number of Lymph Nodes Examined: 5    Total number of pelvic nodes examined: 2    Number of pelvic sentinel nodes examined: 1    Total number of para-aortic nodes examined: 3    Number of para-aortic sentinel nodes examined: 3    Pathologic Stage Classification (pTNM, AJCC 8th Edition) (Note J): pT1a  pN0/FIGO IA  Today patient reports feeling well.  Denies any pain.   Review of Systems  Constitutional: Negative for chills, fever, malaise/fatigue and weight loss.  HENT: Negative for congestion, ear discharge, ear pain, nosebleeds, sinus pain and sore throat.   Eyes: Negative for double vision, photophobia, pain, discharge and redness.  Respiratory: Negative for cough, hemoptysis, sputum production, shortness of breath and wheezing.   Cardiovascular: Negative for chest pain, palpitations, orthopnea, claudication and leg swelling.  Gastrointestinal: Negative for abdominal pain, blood in stool, constipation, diarrhea, heartburn, melena, nausea and vomiting.  Genitourinary: Negative for dysuria, flank pain, frequency and hematuria.  Musculoskeletal: Negative for back pain, myalgias and neck pain.  Skin: Negative for itching and rash.  Neurological: Negative for dizziness, tingling, tremors, focal weakness, weakness and headaches.  Endo/Heme/Allergies: Negative for environmental allergies. Does not bruise/bleed easily.  Psychiatric/Behavioral: Negative for depression and hallucinations. The patient is not nervous/anxious.     MEDICAL HISTORY:  Past Medical History:  Diagnosis Date  . Arthritis   . Cancer [facility])    endometrial  . Endometrial mass   . History of chicken pox   . History of kidney stones   . History of measles   . History of mumps   . Osteoporosis   . PMB (postmenopausal bleeding)   . Psoriasis     SURGICAL HISTORY: Past Surgical History:  Procedure Laterality Date  . APPENDECTOMY  1960  . BREAST SURGERY Left    biopsy  . Injection varicose veins in legs Bilateral 2010   Dr. Hulda Humphrey  . LITHOTRIPSY  1994, 2004   for renal stones  . ROBOTIC ASSISTED TOTAL HYSTERECTOMY Bilateral 04/04/2018   Procedure: ROBOTIC ASSISTED TOTAL HYSTERECTOMY,SENTINEL LYMPH NODE  [place] LYMPH  NODE DISSECTION;  Surgeon: Gillis Ends, MD;  Location: [facility] ORS;  Service: Gynecology;  Laterality: Bilateral;  . TUBAL LIGATION  1980    SOCIAL HISTORY: Social History   Socioeconomic History  . Marital status: Single    Spouse name: Not on file  . Number of children: 1  . Years of education: Not on file  . Highest education level: Not on file  Occupational History  . Occupation: Employed    Comment: Works at a [employer] doing [employer]  Social Needs  . Financial resource strain: Not on file  . Food insecurity:    Worry: Not on file    Inability: Not on file  . Transportation needs:    Medical: Not on file    Non-medical: Not on file  Tobacco Use  . Smoking status: Current Every Day Smoker    Packs/day: 0.50    Types: Cigarettes  . Smokeless tobacco: Never Used  Substance and Sexual Activity  . Alcohol use: No    Alcohol/week: 0.0 oz  . Drug use: No  . Sexual activity: Not Currently  Lifestyle  . Physical activity:    Days per week: Not on file    Minutes per session: Not on file  . Stress: Not on file  Relationships  . Social connections:    Talks on phone: Not on file    Gets together: Not on file    Attends religious service: Not on file    Active member of club or organization: Not on file    Attends meetings of clubs or organizations: Not on file    Relationship status: Not on file  . Intimate partner violence:    Fear of current or ex partner: Not on file    Emotionally abused: Not on file    Physically abused: Not on file    Forced sexual activity: Not on file  Other Topics Concern  . Not on file  Social History Narrative  . Not on file    FAMILY HISTORY: Family History  Problem Relation Age of Onset  . Breast cancer Mother   . Transient ischemic attack Mother   . Arthritis Mother   . Hypothyroidism Mother   . Lung cancer Mother   . Diabetes Father        type 2  . Hypertension Father   . Arthritis Father   . Colon cancer Sister     . Prostate cancer Brother   . Hyperlipidemia Son   . Stomach cancer Maternal Grandmother   . Stroke Maternal Grandfather     ALLERGIES:  has No Known Allergies.  MEDICATIONS:  Current Outpatient Medications  Medication Sig Dispense Refill  . acetaminophen (TYLENOL) 325 MG tablet Take 650 mg by mouth every 6 (six) hours as needed.    . betamethasone dipropionate (DIPROLENE) 0.05 % cream Apply topically 2 (two) times daily. As directed for psoriasis on body. (Patient taking differently: Apply 1 application topically 2 (two) times daily as needed (for psoriasis on body). ) 30 g 1  . desonide (DESOWEN) 0.05 % cream Apply once a day sparingly to seborrhea as needed on face. (Patient taking differently: Apply 1 application topically 2 (two) times daily as needed (for psoriasis on face). ) 30 g 1  . ibuprofen (ADVIL,MOTRIN) 800 MG tablet Take 1 tablet (800 mg total) by mouth every 6 (six) hours. 65 tablet 1  . Multiple Vitamins-Minerals (CENTRUM SILVER ADULT 50+ PO) Take 1 tablet by  mouth daily.    Marland Kitchen oxyCODONE (ROXICODONE) 5 MG immediate release tablet Take 1 tablet (5 mg total) by mouth every 4 (four) hours as needed. (Patient not taking: Reported on 04/18/2018) 16 tablet 0   No current facility-administered medications for this visit.      PHYSICAL EXAMINATION: ECOG PERFORMANCE STATUS: 0 - Asymptomatic Vitals:   04/18/18 1121  BP: 128/77  Pulse: 66  Resp: 18  Temp: (!) 97.5 F (36.4 C)   Filed Weights   04/18/18 1121  Weight: 176 lb 4.8 oz (80 kg)    Physical Exam  Constitutional: She is oriented to person, place, and time. She appears well-developed and well-nourished. No distress.  HENT:  Head: Normocephalic and atraumatic.  Right Ear: External ear normal.  Left Ear: External ear normal.  Mouth/Throat: Oropharynx is clear and moist.  Eyes: Pupils are equal, round, and reactive to light. Conjunctivae and EOM are normal. No scleral icterus.  Neck: Normal range of motion. Neck  supple.  Cardiovascular: Normal rate, regular rhythm and normal heart sounds.  Pulmonary/Chest: Effort normal and breath sounds normal. No respiratory distress. She has no wheezes. She has no rales. She exhibits no tenderness.  Abdominal: Soft. Bowel sounds are normal. She exhibits no distension and no mass. There is no tenderness.  Musculoskeletal: Normal range of motion. She exhibits no edema or deformity.  Lymphadenopathy:    She has no cervical adenopathy.  Neurological: She is alert and oriented to person, place, and time. No cranial nerve deficit. Coordination normal.  Skin: Skin is warm and dry. No rash noted.  Psychiatric: She has a normal mood and affect. Her behavior is normal. Thought content normal.     LABORATORY DATA:  I have reviewed the data as listed Lab Results  Component Value Date   WBC 7.8 04/03/2018   HGB 14.2 04/03/2018   HCT 42.8 04/03/2018   MCV 90.5 04/03/2018   PLT 211 04/03/2018   Recent Labs    02/26/18 0749 04/03/18 1210  NA 139 140  K 3.3* 3.6  CL 107 106  CO2 25 24  GLUCOSE 112* 79  BUN 18 15  CREATININE 0.76 0.75  CALCIUM 8.9 9.1  GFRNONAA >60 >60  GFRAA >60 >60  PROT 7.1 7.3  ALBUMIN 3.8 4.0  AST 22 17  ALT 14 13*  ALKPHOS 56 56  BILITOT 0.7 0.7   Iron/TIBC/Ferritin/ %Sat No results found for: IRON, TIBC, FERRITIN, IRONPCTSAT      ASSESSMENT & PLAN:  1. Endometrial cancer ([place])   2. Goals of care, counseling/discussion   Stage IA carcinosarcoma Patient's case was discussed and GYN tumor board today.  Consensus decision was to proceed with adjuvant chemotherapy with carboplatin and Taxol for 6 cycles, followed by vaginal brachy therapy. Goal of care is curative.   I explained to the patient the risks and benefits of chemotherapy including all but not limited to hair loss, mouth sore, nausea, vomiting, low blood counts, bleeding, and risk of life threatening infection and even death, secondary malignancy etc.  Risk of neuropathy  is associated with Taxol. Patient voices understanding and willing to proceed chemotherapy.   # Chemotherapy education; port placement. Hopefully the planned start chemotherapy next week. Antiemetics-Zofran and Compazine; EMLA cream sent to pharmacy  # Plan Udenyca on Day 2 for bone marrow support.  #I called the patient and update her about tumor board decision.  She is willing to proceed with adjuvant chemotherapy. All questions answered to patient satisfaction. Orders Placed This  Encounter  Procedures  . CBC with Differential    Standing Status:   Standing    Number of Occurrences:   20    Standing Expiration Date:   04/19/2019  . Comprehensive metabolic panel    Standing Status:   Standing    Number of Occurrences:   20    Standing Expiration Date:   04/19/2019  . CA 125    Standing Status:   Future    Standing Expiration Date:   04/18/2019    All questions were answered. The patient knows to call the clinic with any problems questions or concerns.  Return of visit:  Thank you for this kind referral and the opportunity to participate in the care of this patient. A copy of today's note is routed to referring provider  Total face to face encounter time for this patient visit was 60 min. >50% of the time was  spent in counseling and coordination of care.    Earlie Server, MD, PhD Hematology Oncology [facility] Pager- [phone] 04/18/2018

## 2018-04-19 ENCOUNTER — Encounter (INDEPENDENT_AMBULATORY_CARE_PROVIDER_SITE_OTHER): Payer: Self-pay

## 2018-04-19 ENCOUNTER — Other Ambulatory Visit: Payer: Self-pay | Admitting: *Deleted

## 2018-04-19 DIAGNOSIS — C541 Malignant neoplasm of endometrium: Secondary | ICD-10-CM

## 2018-04-19 NOTE — Patient Instructions (Signed)

## 2018-04-23 ENCOUNTER — Inpatient Hospital Stay: Payer: BLUE CROSS/BLUE SHIELD

## 2018-04-23 ENCOUNTER — Other Ambulatory Visit: Payer: Self-pay | Admitting: Oncology

## 2018-04-23 ENCOUNTER — Other Ambulatory Visit (INDEPENDENT_AMBULATORY_CARE_PROVIDER_SITE_OTHER): Payer: Self-pay | Admitting: Vascular Surgery

## 2018-04-24 ENCOUNTER — Ambulatory Visit
Admission: RE | Admit: 2018-04-24 | Discharge: 2018-04-24 | Disposition: A | Discharge status: Home / Self Care | Payer: BLUE CROSS/BLUE SHIELD | Source: Ambulatory Visit | Attending: Vascular Surgery | Admitting: Vascular Surgery

## 2018-04-24 ENCOUNTER — Encounter
Admission: RE | Disposition: A | Discharge status: Home / Self Care | Payer: Self-pay | Source: Ambulatory Visit | Attending: Vascular Surgery

## 2018-04-24 DIAGNOSIS — Z801 Family history of malignant neoplasm of trachea, bronchus and lung: Secondary | ICD-10-CM | POA: Insufficient documentation

## 2018-04-24 DIAGNOSIS — Z823 Family history of stroke: Secondary | ICD-10-CM | POA: Insufficient documentation

## 2018-04-24 DIAGNOSIS — Z8619 Personal history of other infectious and parasitic diseases: Secondary | ICD-10-CM | POA: Insufficient documentation

## 2018-04-24 DIAGNOSIS — Z800 Family history of malignant neoplasm of digestive organs: Secondary | ICD-10-CM | POA: Diagnosis not present

## 2018-04-24 DIAGNOSIS — Z79899 Other long term (current) drug therapy: Secondary | ICD-10-CM | POA: Diagnosis not present

## 2018-04-24 DIAGNOSIS — Z833 Family history of diabetes mellitus: Secondary | ICD-10-CM | POA: Insufficient documentation

## 2018-04-24 DIAGNOSIS — Z803 Family history of malignant neoplasm of breast: Secondary | ICD-10-CM | POA: Diagnosis not present

## 2018-04-24 DIAGNOSIS — Z9889 Other specified postprocedural states: Secondary | ICD-10-CM | POA: Diagnosis not present

## 2018-04-24 DIAGNOSIS — Z90710 Acquired absence of both cervix and uterus: Secondary | ICD-10-CM | POA: Insufficient documentation

## 2018-04-24 DIAGNOSIS — Z87442 Personal history of urinary calculi: Secondary | ICD-10-CM | POA: Insufficient documentation

## 2018-04-24 DIAGNOSIS — F17210 Nicotine dependence, cigarettes, uncomplicated: Secondary | ICD-10-CM | POA: Insufficient documentation

## 2018-04-24 DIAGNOSIS — Z8249 Family history of ischemic heart disease and other diseases of the circulatory system: Secondary | ICD-10-CM | POA: Diagnosis not present

## 2018-04-24 DIAGNOSIS — C541 Malignant neoplasm of endometrium: Secondary | ICD-10-CM

## 2018-04-24 DIAGNOSIS — Z8261 Family history of arthritis: Secondary | ICD-10-CM | POA: Insufficient documentation

## 2018-04-24 DIAGNOSIS — M1990 Unspecified osteoarthritis, unspecified site: Secondary | ICD-10-CM | POA: Diagnosis not present

## 2018-04-24 DIAGNOSIS — Z8042 Family history of malignant neoplasm of prostate: Secondary | ICD-10-CM | POA: Diagnosis not present

## 2018-04-24 HISTORY — PX: PORTA CATH INSERTION: CATH118285

## 2018-04-24 SURGERY — PORTA CATH INSERTION
Anesthesia: Moderate Sedation

## 2018-04-24 MED ORDER — FENTANYL CITRATE (PF) 100 MCG/2ML IJ SOLN
INTRAMUSCULAR | Status: DC | PRN
  Administered 2018-04-24 (×2): 50 ug via INTRAVENOUS

## 2018-04-24 MED ORDER — MIDAZOLAM HCL 5 MG/5ML IJ SOLN
INTRAMUSCULAR | Status: AC
  Filled 2018-04-24: qty 5

## 2018-04-24 MED ORDER — CEFAZOLIN SODIUM-DEXTROSE 2-4 GM/100ML-% IV SOLN
2.0000 g | Freq: Once | INTRAVENOUS | Status: AC
  Administered 2018-04-24: 2 g via INTRAVENOUS

## 2018-04-24 MED ORDER — SODIUM CHLORIDE 0.9 % IV SOLN
INTRAVENOUS | Status: DC
  Administered 2018-04-24: 08:00:00 via INTRAVENOUS

## 2018-04-24 MED ORDER — HYDROMORPHONE HCL 1 MG/ML IJ SOLN: 1.0000 mg | Freq: Once | INTRAMUSCULAR | Status: DC | PRN

## 2018-04-24 MED ORDER — ONDANSETRON HCL 4 MG/2ML IJ SOLN: 4.0000 mg | Freq: Four times a day (QID) | INTRAMUSCULAR | Status: DC | PRN

## 2018-04-24 MED ORDER — MIDAZOLAM HCL 2 MG/2ML IJ SOLN
INTRAMUSCULAR | Status: DC | PRN
  Administered 2018-04-24: 1 mg via INTRAVENOUS
  Administered 2018-04-24: 2 mg via INTRAVENOUS

## 2018-04-24 MED ORDER — LIDOCAINE-EPINEPHRINE (PF) 1 %-1:200000 IJ SOLN
INTRAMUSCULAR | Status: AC
  Filled 2018-04-24: qty 30

## 2018-04-24 MED ORDER — HEPARIN (PORCINE) IN NACL 1000-0.9 UT/500ML-% IV SOLN
INTRAVENOUS | Status: AC
  Filled 2018-04-24: qty 500

## 2018-04-24 MED ORDER — FENTANYL CITRATE (PF) 100 MCG/2ML IJ SOLN
INTRAMUSCULAR | Status: AC
  Filled 2018-04-24: qty 2

## 2018-04-24 SURGICAL SUPPLY — 9 items
DRAPE INCISE IOBAN 66X45 STRL (DRAPES) ×2 IMPLANT
KIT PORT POWER 8FR ISP CVUE (Port) ×2 IMPLANT
NEEDLE ENTRY 21GA 7CM ECHOTIP (NEEDLE) ×2 IMPLANT
PACK ANGIOGRAPHY (CUSTOM PROCEDURE TRAY) ×2 IMPLANT
SET INTRO CAPELLA COAXIAL (SET/KITS/TRAYS/PACK) ×2 IMPLANT
SUT MNCRL AB 4-0 PS2 18 (SUTURE) ×2 IMPLANT
SUT PROLENE 0 CT 1 30 (SUTURE) ×2 IMPLANT
SUT VIC AB 3-0 SH 27 (SUTURE) ×1
SUT VIC AB 3-0 SH 27X BRD (SUTURE) ×1 IMPLANT

## 2018-04-24 NOTE — H&P (Signed)
[facility] History & Physical Update  The patient was interviewed and re-examined.  The patient's previous History and Physical has been reviewed and is unchanged.  There is no change in the plan of care. We plan to proceed with the scheduled procedure.  Hortencia Pilar, MD  04/24/2018, 9:11 AM

## 2018-04-24 NOTE — Op Note (Signed)
OPERATIVE NOTE   PROCEDURE: 1. Placement of a right IJ Infuse-a-Port  PRE-OPERATIVE DIAGNOSIS: Endometrial carcinoma  POST-OPERATIVE DIAGNOSIS: Same  SURGEON: Katha Cabal M.D.  ANESTHESIA: Conscious sedation was administered under my direct supervision by the interventional radiology RN. IV Versed plus fentanyl were utilized. Continuous ECG, pulse oximetry and blood pressure was monitored throughout the entire procedure. Conscious sedation was for a total of 27 minutes.  ESTIMATED BLOOD LOSS: Minimal   FINDING(S): 1.  Patent vein  SPECIMEN(S): None  INDICATIONS:   Katherine Snyder is a 67 y.o. female who presents with endometrial carcinoma and will require chemotherapy.  DESCRIPTION: After obtaining full informed written consent, the patient was brought back to the special procedure suite and placed in the supine position. The patient's right neck and chest wall are prepped and draped in sterile fashion. Appropriate timeout was called.  Ultrasound is placed in a sterile sleeve, ultrasound is utilized to avoid vascular injury as well as secondary to lack of appropriate landmarks. The right IJ vein is identified. It is echolucent and homogeneous as well as easily compressible indicating patency. An image is recorded for the permanent record.  Access to the vein with a micropuncture needle is done under direct ultrasound visualization.  1% lidocaine is infiltrated into the soft tissue at the base of the neck as well as on the chest wall.  Under direct ultrasound visualization a micro-needle is inserted into the vein followed by the micro-wire. Micro-sheath was then advanced and a J wire is inserted without difficulty under fluoroscopic guidance. A small counterincision was created at the wire insertion site. A transverse incision is created 2 fingerbreadths below the scapula and a pocket is fashioned using both blunt and sharp dissection. The pocket is tested for appropriate size with the  hub of the Infuse-a-Port. The tunneling device is then used to pull the intravascular portion of the catheter from the pocket to the neck counterincision.  Dilator and peel-away sheath were then inserted over the wire and the wire is removed. Catheter is then advanced into the venous system without difficulty. Peel-away sheath was then removed.  Catheter is then positioned under fluoroscopic guidance at the atrial caval junction. It is then transected connected to the hub and the hope is slipped into the subcutaneous pocket on the chest wall. The hub was then accessed percutaneously and aspirates easily and flushes well and is flushed with 30 cc of heparinized saline. The pocket incision is then closed in layers using interrupted 3-0 Vicryl for the subcutaneous tissues and 4-0 Monocryl subcuticular for skin closure. Dermabond is applied. The neck counterincision was closed with 4-0 Monocryl subcuticular and Dermabond as well.  The patient tolerated the procedure well and there were no immediate complications.  COMPLICATIONS: None  CONDITION: Unchanged  Katha Cabal M.D. [facility] Office: [phone]   04/24/2018, 9:15 AM

## 2018-04-25 ENCOUNTER — Inpatient Hospital Stay: Payer: BLUE CROSS/BLUE SHIELD

## 2018-04-25 ENCOUNTER — Other Ambulatory Visit: Payer: Self-pay

## 2018-04-25 ENCOUNTER — Encounter: Payer: Self-pay | Admitting: Oncology

## 2018-04-25 ENCOUNTER — Inpatient Hospital Stay (HOSPITAL_BASED_OUTPATIENT_CLINIC_OR_DEPARTMENT_OTHER): Payer: BLUE CROSS/BLUE SHIELD | Admitting: Oncology

## 2018-04-25 VITALS — BP 107/71 | HR 73 | Temp 98.7°F | Resp 18

## 2018-04-25 VITALS — BP 115/70 | HR 65 | Temp 97.1°F | Resp 18 | Wt 179.1 lb

## 2018-04-25 DIAGNOSIS — C541 Malignant neoplasm of endometrium: Secondary | ICD-10-CM

## 2018-04-25 DIAGNOSIS — R682 Dry mouth, unspecified: Secondary | ICD-10-CM | POA: Diagnosis not present

## 2018-04-25 DIAGNOSIS — Z720 Tobacco use: Secondary | ICD-10-CM

## 2018-04-25 DIAGNOSIS — K117 Disturbances of salivary secretion: Secondary | ICD-10-CM | POA: Diagnosis not present

## 2018-04-25 DIAGNOSIS — G8918 Other acute postprocedural pain: Secondary | ICD-10-CM | POA: Diagnosis not present

## 2018-04-25 DIAGNOSIS — Z90710 Acquired absence of both cervix and uterus: Secondary | ICD-10-CM | POA: Diagnosis not present

## 2018-04-25 DIAGNOSIS — G629 Polyneuropathy, unspecified: Secondary | ICD-10-CM | POA: Diagnosis not present

## 2018-04-25 DIAGNOSIS — Z90722 Acquired absence of ovaries, bilateral: Secondary | ICD-10-CM | POA: Diagnosis not present

## 2018-04-25 DIAGNOSIS — Z5111 Encounter for antineoplastic chemotherapy: Secondary | ICD-10-CM | POA: Diagnosis not present

## 2018-04-25 DIAGNOSIS — R309 Painful micturition, unspecified: Secondary | ICD-10-CM | POA: Diagnosis not present

## 2018-04-25 DIAGNOSIS — L309 Dermatitis, unspecified: Secondary | ICD-10-CM | POA: Diagnosis not present

## 2018-04-25 LAB — COMPREHENSIVE METABOLIC PANEL
ALT: 15 U/L (ref 14–54)
AST: 22 U/L (ref 15–41)
Albumin: 3.6 g/dL (ref 3.5–5.0)
Alkaline Phosphatase: 53 U/L (ref 38–126)
Anion gap: 6 (ref 5–15)
BUN: 22 mg/dL — ABNORMAL HIGH (ref 6–20)
CO2: 23 mmol/L (ref 22–32)
Calcium: 8.7 mg/dL — ABNORMAL LOW (ref 8.9–10.3)
Chloride: 108 mmol/L (ref 101–111)
Creatinine, Ser: 0.77 mg/dL (ref 0.44–1.00)
GFR calc Af Amer: >60 mL/min (ref >60)
GFR calc non Af Amer: >60 mL/min (ref >60)
Glucose, Bld: 137 mg/dL — ABNORMAL HIGH (ref 65–99)
Potassium: 3.6 mmol/L (ref 3.5–5.1)
Sodium: 137 mmol/L (ref 135–145)
Total Bilirubin: 0.5 mg/dL (ref 0.3–1.2)
Total Protein: 6.5 g/dL (ref 6.5–8.1)

## 2018-04-25 LAB — CBC WITH DIFFERENTIAL/PLATELET
Basophils Absolute: 0.1 10*3/uL (ref 0–0.1)
Basophils Relative: 1 %
Eosinophils Absolute: 0.2 10*3/uL (ref 0–0.7)
Eosinophils Relative: 2 %
HCT: 37.1 % (ref 35.0–47.0)
Hemoglobin: 12.6 g/dL (ref 12.0–16.0)
Lymphocytes Relative: 22 %
Lymphs Abs: 1.7 10*3/uL (ref 1.0–3.6)
MCH: 30.8 pg (ref 26.0–34.0)
MCHC: 34 g/dL (ref 32.0–36.0)
MCV: 90.4 fL (ref 80.0–100.0)
Monocytes Absolute: 0.6 10*3/uL (ref 0.2–0.9)
Monocytes Relative: 8 %
Neutro Abs: 5.3 10*3/uL (ref 1.4–6.5)
Neutrophils Relative %: 67 %
Platelets: 239 10*3/uL (ref 150–440)
RBC: 4.1 MIL/uL (ref 3.80–5.20)
RDW: 13.5 % (ref 11.5–14.5)
WBC: 7.9 10*3/uL (ref 3.6–11.0)

## 2018-04-25 MED ORDER — FAMOTIDINE IN NACL 20-0.9 MG/50ML-% IV SOLN
20.0000 mg | Freq: Once | INTRAVENOUS | Status: AC
  Administered 2018-04-25: 20 mg via INTRAVENOUS
  Filled 2018-04-25: qty 50

## 2018-04-25 MED ORDER — SODIUM CHLORIDE 0.9% FLUSH
10.0000 mL | Freq: Once | INTRAVENOUS | Status: AC
  Administered 2018-04-25: 10 mL via INTRAVENOUS
  Filled 2018-04-25: qty 10

## 2018-04-25 MED ORDER — SODIUM CHLORIDE 0.9 % IV SOLN
Freq: Once | INTRAVENOUS | Status: AC
  Administered 2018-04-25: 10:00:00 via INTRAVENOUS
  Filled 2018-04-25: qty 1000

## 2018-04-25 MED ORDER — DIPHENHYDRAMINE HCL 50 MG/ML IJ SOLN
50.0000 mg | Freq: Once | INTRAMUSCULAR | Status: AC
  Administered 2018-04-25: 50 mg via INTRAVENOUS
  Filled 2018-04-25: qty 1

## 2018-04-25 MED ORDER — METHYLPREDNISOLONE SODIUM SUCC 125 MG IJ SOLR
125.0000 mg | Freq: Once | INTRAMUSCULAR | Status: AC | PRN
  Administered 2018-04-25: 125 mg via INTRAVENOUS

## 2018-04-25 MED ORDER — DEXAMETHASONE SODIUM PHOSPHATE 100 MG/10ML IJ SOLN
20.0000 mg | Freq: Once | INTRAMUSCULAR | Status: AC
  Administered 2018-04-25: 20 mg via INTRAVENOUS
  Filled 2018-04-25: qty 2

## 2018-04-25 MED ORDER — HEPARIN SOD (PORK) LOCK FLUSH 100 UNIT/ML IV SOLN: 500.0000 [IU] | Freq: Once | INTRAVENOUS | Status: DC | PRN

## 2018-04-25 MED ORDER — SODIUM CHLORIDE 0.9 % IV SOLN
Freq: Once | INTRAVENOUS | Status: AC | PRN
  Administered 2018-04-25: 12:00:00 via INTRAVENOUS
  Filled 2018-04-25: qty 1000

## 2018-04-25 MED ORDER — PALONOSETRON HCL INJECTION 0.25 MG/5ML
0.2500 mg | Freq: Once | INTRAVENOUS | Status: AC
  Administered 2018-04-25: 0.25 mg via INTRAVENOUS
  Filled 2018-04-25: qty 5

## 2018-04-25 MED ORDER — FAMOTIDINE IN NACL 20-0.9 MG/50ML-% IV SOLN
20.0000 mg | Freq: Once | INTRAVENOUS | Status: AC | PRN
  Administered 2018-04-25: 20 mg via INTRAVENOUS

## 2018-04-25 MED ORDER — SODIUM CHLORIDE 0.9 % IV SOLN
573.5000 mg | Freq: Once | INTRAVENOUS | Status: AC
  Administered 2018-04-25: 570 mg via INTRAVENOUS
  Filled 2018-04-25: qty 57

## 2018-04-25 MED ORDER — SODIUM CHLORIDE 0.9 % IV SOLN
175.0000 mg/m2 | Freq: Once | INTRAVENOUS | Status: AC
  Administered 2018-04-25: 342 mg via INTRAVENOUS
  Filled 2018-04-25: qty 57

## 2018-04-25 MED ORDER — DIPHENHYDRAMINE HCL 50 MG/ML IJ SOLN
25.0000 mg | Freq: Once | INTRAMUSCULAR | Status: AC | PRN
  Administered 2018-04-25: 25 mg via INTRAVENOUS

## 2018-04-25 MED ORDER — HEPARIN SOD (PORK) LOCK FLUSH 100 UNIT/ML IV SOLN
500.0000 [IU] | Freq: Once | INTRAVENOUS | Status: AC
  Administered 2018-04-25: 500 [IU] via INTRAVENOUS
  Filled 2018-04-25: qty 5

## 2018-04-25 NOTE — Progress Notes (Signed)
1205-Patient returned to seat from restroom with complaint of vaginal burning/pain and heartburn/indigestion. Patient states, "I am burning down there and having really bad heartburn." Taxol stopped(Taxol had just completed second titration bump up which was running at a rate of 95MW/UX, per policy). VSS. 0.9% Sodium Chloride infusing, see MAR. Rulon Abide, NP, notified via telephone and present at chair side at 1207. Benadryl, Solu-medrol, and Pepcid given per NP order, see MAR.   Rulon Abide, NP, spoke with MD, Dr. Tasia Catchings, verbally. Per Dr. Tasia Catchings order: wait 30 minutes or until all symptoms resolved, then restart Taxol at previous rate of 18ml/hr and continue first time Taxol titration per policy.  1255- Patient states, "I feel better and do not have any heartburn or pain." Patient denies any further symptoms and is ready to restart Taxol. Rulon Abide, NP, notified via telephone. Per NP order: proceed to restart Taxol as ordered by Dr. Tasia Catchings at rate of 61ml/hr and continue first time Taxol titration per policy.

## 2018-04-25 NOTE — Progress Notes (Signed)
Patient here today for follow up. Has questions about Decadron regimen and about starting taking multivitamins again.

## 2018-04-25 NOTE — Progress Notes (Signed)
Hematology/Oncology Consult note [facility] [phone] [phone]   Patient Care Team: Chrismon, Vickki Muff, PA as PCP - General (Family Medicine) REASON FOR VISIT Follow up for treatment of uterus carcinosarcoma  HISTORY OF PRESENTING ILLNESS:  Katherine Snyder is a  67 y.o.  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 Pathology showed  A uterus with cervix hysterectomy -Carcinosarcoma B sentinel lymph node, right primary obturator negative C lymph node right external iliac lymph negative D fallopian tube and ovary, unilateral right negative E sentinel lymph node right low periaortic: Negative F sentinel lymph node left low periaortic negative  CANCER CASE SUMMARY: ENDOMETRIUM  Procedure: Total hysterectomy and bilateral salpingo-oophorectomy  Histologic Type: Carcinosarcoma  Histologic Grade: Not applicable  Myometrial Invasion: Present    Depth of myometrial invasion cannot be determined due to exophytic  tumor growth and effacement of endometrial/myometrial junction    Myometrial thickness: At least 8 mm    Percentage depth of myometrial invasion: Estimated less than 50%  Uterine Serosa Involvement: Not identified  Cervical Stromal Involvement: Not identified  Other Tissue/Organ Involvement: Not identified  Peritoneal/ Ascitic Fluid: Negative for malignancy  Lymphovascular Invasion: Present  Regional Lymph Nodes: All lymph nodes negative for tumor cells  Number of Lymph Nodes Examined: 5    Total number of pelvic nodes examined: 2    Number of pelvic sentinel nodes examined: 1    Total number of para-aortic nodes examined: 3    Number of para-aortic sentinel nodes examined: 3   Pathologic Stage Classification (pTNM, AJCC 8th  Edition) (Note J): pT1a  pN0/FIGO IA  Today patient reports feeling well.  Denies any pain.  # Patient's case was discussed and GYN tumor board today.  Consensus decision was to proceed with adjuvant chemotherapy with carboplatin and Taxol for 6 cycles, followed by vaginal brachy therapy.   INTERVAL HISTORY Katherine Snyder is a 67 y.o. female who has above history reviewed by me today presents for assessment prior to chemotherapy management for stage IA endometrial carcinosarcoma During the interval patient has had Mediport placed.  She has also went to chemo class. Denies any complaints today.  Reports feeling anxious.  She has antiemetics and dexamethasone at home.  Review of Systems  Constitutional: Negative for chills, fever, malaise/fatigue and weight loss.  HENT: Negative for congestion, ear discharge, ear pain, nosebleeds, sinus pain and sore throat.   Eyes: Negative for double vision, photophobia, pain, discharge and redness.  Respiratory: Negative for cough, hemoptysis, sputum production, shortness of breath and wheezing.   Cardiovascular: Negative for chest pain, palpitations, orthopnea, claudication and leg swelling.  Gastrointestinal: Negative for abdominal pain, blood in stool, constipation, diarrhea, heartburn, melena, nausea and vomiting.  Genitourinary: Negative for dysuria, flank pain, frequency and hematuria.  Musculoskeletal: Negative for back pain, myalgias and neck pain.  Skin: Negative for itching and rash.  Neurological: Negative for dizziness, tingling, tremors, focal weakness, weakness and headaches.  Endo/Heme/Allergies: Negative for environmental allergies. Does not bruise/bleed easily.  Psychiatric/Behavioral: Negative for depression and hallucinations. The patient is nervous/anxious.     MEDICAL HISTORY:  Past Medical History:  Diagnosis Date  . Arthritis   . [facility])    endometrial  . Endometrial mass   . History of chicken pox   . History of kidney  stones   . History  of measles   . History of mumps   . Osteoporosis   . PMB (postmenopausal bleeding)   . Psoriasis     SURGICAL HISTORY: Past Surgical History:  Procedure Laterality Date  . APPENDECTOMY  1960  . BREAST SURGERY Left    biopsy  . Injection varicose veins in legs Bilateral 2010   Dr. Hulda Humphrey  . LITHOTRIPSY  1994, 2004   for renal stones  . PORTA CATH INSERTION N/A 04/24/2018   Procedure: PORTA CATH INSERTION;  Surgeon: Katha Cabal, MD;  Location: [place] [facility];  Service: Cardiovascular;  Laterality: N/A;  . ROBOTIC ASSISTED TOTAL HYSTERECTOMY Bilateral 04/04/2018   Procedure: ROBOTIC ASSISTED TOTAL HYSTERECTOMY,SENTINEL LYMPH NODE MAPPING AND BIOPSIES,AORTIC LYMPH NODE DISSECTION;  Surgeon: Gillis Ends, MD;  Location: [facility];  Service: Gynecology;  Laterality: Bilateral;  . TUBAL LIGATION  1980    SOCIAL HISTORY: Social History   Socioeconomic History  . Marital status: Single    Spouse name: Not on file  . Number of children: 1  . Years of education: Not on file  . Highest education level: Not on file  Occupational History  . Occupation: Employed    Comment: Works at a [employer] doing [employer]  Social Needs  . Financial resource strain: Not on file  . Food insecurity:    Worry: Not on file    Inability: Not on file  . Transportation needs:    Medical: Not on file    Non-medical: Not on file  Tobacco Use  . Smoking status: Current Every Day Smoker    Packs/day: 0.50    Types: Cigarettes  . Smokeless tobacco: Never Used  Substance and Sexual Activity  . Alcohol use: No    Alcohol/week: 0.0 oz  . Drug use: No  . Sexual activity: Not Currently  Lifestyle  . Physical activity:    Days per week: Not on file    Minutes per session: Not on file  . Stress: Not on file  Relationships  . Social connections:    Talks on phone: Not on file    Gets together: Not on file    Attends religious service: Not on file    Active member  of club or organization: Not on file    Attends meetings of clubs or organizations: Not on file    Relationship status: Not on file  . Intimate partner violence:    Fear of current or ex partner: Not on file    Emotionally abused: Not on file    Physically abused: Not on file    Forced sexual activity: Not on file  Other Topics Concern  . Not on file  Social History Narrative  . Not on file    FAMILY HISTORY: Family History  Problem Relation Age of Onset  . Breast cancer Mother   . Transient ischemic attack Mother   . Arthritis Mother   . Hypothyroidism Mother   . Lung cancer Mother   . Diabetes Father        type 2  . Hypertension Father   . Arthritis Father   . Colon cancer Sister   . Prostate cancer Brother   . Hyperlipidemia Son   . Stomach cancer Maternal Grandmother   . Stroke Maternal Grandfather     ALLERGIES:  has No Known Allergies.  MEDICATIONS:  Current Outpatient Medications  Medication Sig Dispense Refill  . acetaminophen (TYLENOL) 325 MG tablet Take 650 mg by mouth every 6 (six) hours  as needed.    . betamethasone dipropionate (DIPROLENE) 0.05 % cream Apply topically 2 (two) times daily. As directed for psoriasis on body. (Patient taking differently: Apply 1 application topically 2 (two) times daily as needed (for psoriasis on body). ) 30 g 1  . desonide (DESOWEN) 0.05 % cream Apply once a day sparingly to seborrhea as needed on face. (Patient taking differently: Apply 1 application topically 2 (two) times daily as needed (for psoriasis on face). ) 30 g 1  . ibuprofen (ADVIL,MOTRIN) 800 MG tablet Take 1 tablet (800 mg total) by mouth every 6 (six) hours. 65 tablet 1  . lidocaine-prilocaine (EMLA) cream Apply to affected area once 30 g 3  . dexamethasone (DECADRON) 4 MG tablet Take 2 tablets (8 mg total) by mouth daily. Start the day after chemotherapy for 2 days. (Patient not taking: Reported on 04/25/2018) 30 tablet 1  . Multiple Vitamins-Minerals (CENTRUM  SILVER ADULT 50+ PO) Take 1 tablet by mouth daily.    . ondansetron (ZOFRAN) 8 MG tablet Take 1 tablet (8 mg total) by mouth 2 (two) times daily as needed for refractory nausea / vomiting. (Patient not taking: Reported on 04/25/2018) 30 tablet 1  . oxyCODONE (ROXICODONE) 5 MG immediate release tablet Take 1 tablet (5 mg total) by mouth every 4 (four) hours as needed. (Patient not taking: Reported on 04/24/2018) 16 tablet 0  . prochlorperazine (COMPAZINE) 10 MG tablet Take 1 tablet (10 mg total) by mouth every 6 (six) hours as needed (Nausea or vomiting). (Patient not taking: Reported on 04/25/2018) 30 tablet 1   No current facility-administered medications for this visit.    Facility-Administered Medications Ordered in Other Visits  Medication Dose Route Frequency Provider Last Rate Last Dose  . CARBOplatin (PARAPLATIN) 570 mg in sodium chloride 0.9 % 250 mL chemo infusion  570 mg Intravenous Once Earlie Server, MD      . heparin lock flush 100 unit/mL  500 Units Intravenous Once Earlie Server, MD      . heparin lock flush 100 unit/mL  500 Units Intracatheter Once PRN Earlie Server, MD         PHYSICAL EXAMINATION: ECOG PERFORMANCE STATUS: 0 - Asymptomatic Vitals:   04/25/18 0932  BP: 115/70  Pulse: 65  Resp: 18  Temp: (!) 97.1 F (36.2 C)   Filed Weights   04/25/18 0932  Weight: 179 lb 1.6 oz (81.2 kg)    Physical Exam  Constitutional: She is oriented to person, place, and time. She appears well-developed and well-nourished. No distress.  HENT:  Head: Normocephalic and atraumatic.  Right Ear: External ear normal.  Left Ear: External ear normal.  Mouth/Throat: Oropharynx is clear and moist.  Eyes: Pupils are equal, round, and reactive to light. Conjunctivae and EOM are normal. No scleral icterus.  Neck: Normal range of motion. Neck supple.  Cardiovascular: Normal rate, regular rhythm and normal heart sounds.  Pulmonary/Chest: Effort normal and breath sounds normal. No respiratory distress. She  has no wheezes. She has no rales. She exhibits no tenderness.  Abdominal: Soft. Bowel sounds are normal. She exhibits no distension and no mass. There is no tenderness.  Musculoskeletal: Normal range of motion. She exhibits no edema or deformity.  Lymphadenopathy:    She has no cervical adenopathy.  Neurological: She is alert and oriented to person, place, and time. No cranial nerve deficit. Coordination normal.  Skin: Skin is warm and dry. No rash noted.  Psychiatric: She has a normal mood and affect. Her behavior  is normal. Thought content normal.     LABORATORY DATA:  I have reviewed the data as listed Lab Results  Component Value Date   WBC 7.9 04/25/2018   HGB 12.6 04/25/2018   HCT 37.1 04/25/2018   MCV 90.4 04/25/2018   PLT 239 04/25/2018   Recent Labs    02/26/18 0749 04/03/18 1210 04/25/18 0917  NA 139 140 137  K 3.3* 3.6 3.6  CL 107 106 108  CO2 25 24 23   GLUCOSE 112* 79 137*  BUN 18 15 22*  CREATININE 0.76 0.75 0.77  CALCIUM 8.9 9.1 8.7*  GFRNONAA >60 >60 >60  GFRAA >60 >60 >60  PROT 7.1 7.3 6.5  ALBUMIN 3.8 4.0 3.6  AST 22 17 22   ALT 14 13* 15  ALKPHOS 56 56 53  BILITOT 0.7 0.7 0.5   Iron/TIBC/Ferritin/ %Sat No results found for: IRON, TIBC, FERRITIN, IRONPCTSAT      ASSESSMENT & PLAN:  1. Endometrial cancer ([place])   2. Encounter for antineoplastic chemotherapy   Stage IA carcinosarcoma Labs reviewed, acceptable.  Proceed with cycle 1 carbo and Taxol treatment. Ellen Henri was not approved by insurance. Plan check CBC weekly.  She can follow-up in 1 week to see NP for assessment of tolerability of. If her Leslie dropped below 1, plan Neupogen. Patient has many questions about chemotherapy and I answered all the questions to them. Orders Placed This Encounter  Procedures  . CBC with Differential/Platelet    Standing Status:   Future    Standing Expiration Date:   04/26/2019  . CBC with Differential/Platelet    Standing Status:   Future    Standing  Expiration Date:   04/26/2019    All questions were answered. The patient knows to call the clinic with any problems questions or concerns.  Return of visit: Follow-up in 1 week see NP for tolerability assessment.   3 weeks for assessment prior to next cycle of treatment. Total face to face encounter time for this patient visit was 25  min. >50% of the time was  spent in counseling and coordination of care.     Earlie Server, MD, PhD Hematology Oncology [facility] Pager- [phone] 04/25/2018

## 2018-04-26 ENCOUNTER — Ambulatory Visit: Payer: BLUE CROSS/BLUE SHIELD

## 2018-04-26 LAB — CA 125: Cancer Antigen (CA) 125: 10.5 U/mL (ref 0.0–38.1)

## 2018-05-02 ENCOUNTER — Encounter: Payer: Self-pay | Admitting: Oncology

## 2018-05-02 ENCOUNTER — Inpatient Hospital Stay: Payer: BLUE CROSS/BLUE SHIELD

## 2018-05-02 ENCOUNTER — Inpatient Hospital Stay (HOSPITAL_BASED_OUTPATIENT_CLINIC_OR_DEPARTMENT_OTHER): Payer: BLUE CROSS/BLUE SHIELD | Admitting: Obstetrics and Gynecology

## 2018-05-02 ENCOUNTER — Inpatient Hospital Stay: Payer: BLUE CROSS/BLUE SHIELD | Admitting: Oncology

## 2018-05-02 ENCOUNTER — Other Ambulatory Visit: Payer: Self-pay

## 2018-05-02 VITALS — BP 128/83 | HR 83 | Temp 97.9°F | Resp 18 | Ht 68.0 in | Wt 175.2 lb

## 2018-05-02 DIAGNOSIS — G629 Polyneuropathy, unspecified: Secondary | ICD-10-CM

## 2018-05-02 DIAGNOSIS — L309 Dermatitis, unspecified: Secondary | ICD-10-CM | POA: Diagnosis not present

## 2018-05-02 DIAGNOSIS — R682 Dry mouth, unspecified: Secondary | ICD-10-CM

## 2018-05-02 DIAGNOSIS — Z90710 Acquired absence of both cervix and uterus: Secondary | ICD-10-CM

## 2018-05-02 DIAGNOSIS — R309 Painful micturition, unspecified: Secondary | ICD-10-CM | POA: Diagnosis not present

## 2018-05-02 DIAGNOSIS — Z5111 Encounter for antineoplastic chemotherapy: Secondary | ICD-10-CM | POA: Diagnosis not present

## 2018-05-02 DIAGNOSIS — Z90722 Acquired absence of ovaries, bilateral: Secondary | ICD-10-CM

## 2018-05-02 DIAGNOSIS — C541 Malignant neoplasm of endometrium: Secondary | ICD-10-CM | POA: Diagnosis not present

## 2018-05-02 DIAGNOSIS — G8918 Other acute postprocedural pain: Secondary | ICD-10-CM | POA: Diagnosis not present

## 2018-05-02 DIAGNOSIS — K117 Disturbances of salivary secretion: Secondary | ICD-10-CM

## 2018-05-02 DIAGNOSIS — R300 Dysuria: Secondary | ICD-10-CM

## 2018-05-02 DIAGNOSIS — Z720 Tobacco use: Secondary | ICD-10-CM

## 2018-05-02 LAB — COMPREHENSIVE METABOLIC PANEL
ALT: 42 U/L (ref 0–44)
AST: 26 U/L (ref 15–41)
Albumin: 4.2 g/dL (ref 3.5–5.0)
Alkaline Phosphatase: 53 U/L (ref 38–126)
Anion gap: 10 (ref 5–15)
BUN: 27 mg/dL — ABNORMAL HIGH (ref 8–23)
CO2: 23 mmol/L (ref 22–32)
Calcium: 9.5 mg/dL (ref 8.9–10.3)
Chloride: 105 mmol/L (ref 98–111)
Creatinine, Ser: 0.66 mg/dL (ref 0.44–1.00)
GFR calc Af Amer: >60 mL/min (ref >60)
GFR calc non Af Amer: >60 mL/min (ref >60)
Glucose, Bld: 137 mg/dL — ABNORMAL HIGH (ref 70–99)
Potassium: 4.1 mmol/L (ref 3.5–5.1)
Sodium: 138 mmol/L (ref 135–145)
Total Bilirubin: 0.8 mg/dL (ref 0.3–1.2)
Total Protein: 7.4 g/dL (ref 6.5–8.1)

## 2018-05-02 LAB — URINALYSIS, COMPLETE (UACMP) WITH MICROSCOPIC
Bacteria, UA: NONE SEEN
Bilirubin Urine: NEGATIVE
Glucose, UA: NEGATIVE mg/dL
Ketones, ur: NEGATIVE mg/dL
Nitrite: NEGATIVE
Protein, ur: NEGATIVE mg/dL
Specific Gravity, Urine: 1.02 (ref 1.005–1.030)
pH: 5 (ref 5.0–8.0)

## 2018-05-02 LAB — CBC WITH DIFFERENTIAL/PLATELET
Basophils Absolute: 0 10*3/uL (ref 0–0.1)
Basophils Relative: 1 %
Eosinophils Absolute: 0.1 10*3/uL (ref 0–0.7)
Eosinophils Relative: 2 %
HCT: 38.5 % (ref 35.0–47.0)
Hemoglobin: 13.1 g/dL (ref 12.0–16.0)
Lymphocytes Relative: 33 %
Lymphs Abs: 1.7 10*3/uL (ref 1.0–3.6)
MCH: 30.7 pg (ref 26.0–34.0)
MCHC: 34 g/dL (ref 32.0–36.0)
MCV: 90.1 fL (ref 80.0–100.0)
Monocytes Absolute: 0.1 10*3/uL — ABNORMAL LOW (ref 0.2–0.9)
Monocytes Relative: 2 %
Neutro Abs: 3.1 10*3/uL (ref 1.4–6.5)
Neutrophils Relative %: 62 %
Platelets: 208 10*3/uL (ref 150–440)
RBC: 4.27 MIL/uL (ref 3.80–5.20)
RDW: 12.9 % (ref 11.5–14.5)
WBC: 5 10*3/uL (ref 3.6–11.0)

## 2018-05-02 MED ORDER — BIOTENE DRY MOUTH MT LIQD: 15.0000 mL | OROMUCOSAL | 1 refills | Status: DC | PRN

## 2018-05-02 MED ORDER — MAGIC MOUTHWASH W/LIDOCAINE: 5.0000 mL | Freq: Four times a day (QID) | ORAL | 0 refills | Status: DC

## 2018-05-02 MED ORDER — TRAMADOL HCL 50 MG PO TABS: 100.0000 mg | ORAL_TABLET | Freq: Two times a day (BID) | ORAL | 0 refills | Status: DC | PRN

## 2018-05-02 MED ORDER — TRIAMCINOLONE ACETONIDE 0.025 % EX CREA: 1.0000 "application " | TOPICAL_CREAM | Freq: Two times a day (BID) | CUTANEOUS | 0 refills | Status: DC

## 2018-05-02 NOTE — Progress Notes (Signed)
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 Pathology showed  A uterus with cervix hysterectomy -Carcinosarcoma B sentinel lymph node, right primary obturator negative C lymph node right external iliac lymph negative D fallopian tube and ovary, unilateral right negative E sentinel lymph node right low periaortic: Negative F sentinel lymph node left low periaortic negative  CANCER CASE SUMMARY: ENDOMETRIUM  Procedure: Total hysterectomy and bilateral salpingo-oophorectomy  Histologic Type: Carcinosarcoma  Histologic Grade: Not applicable  Myometrial Invasion: Present    Depth of myometrial invasion cannot be determined due to exophytic  tumor growth and effacement of endometrial/myometrial junction    Myometrial thickness: At least 8 mm    Percentage depth of myometrial invasion: Estimated less than 50%  Uterine Serosa Involvement: Not identified  Cervical Stromal Involvement: Not identified  Other Tissue/Organ Involvement: Not identified  Peritoneal/ Ascitic Fluid: Negative for malignancy  Lymphovascular Invasion: Present  Regional Lymph Nodes: All lymph nodes negative for tumor cells  Number of Lymph Nodes Examined: 5    Total number of pelvic nodes examined: 2    Number of pelvic sentinel nodes examined: 1    Total number of para-aortic nodes examined: 3    Number of para-aortic sentinel nodes examined: 3   Pathologic Stage Classification (pTNM, AJCC 8th Edition) (Note J): pT1a  pN0/FIGO IA   Patient's case was discussed and GYN tumor board  today.  Consensus decision was to proceed with adjuvant chemotherapy with carboplatin and Taxol for 6 cycles, followed by vaginal brachy therapy.      Endometrial cancer ([place])   04/04/2018 Initial Diagnosis    Endometrial cancer ([place])      04/18/2018 -  Chemotherapy    The patient had palonosetron (ALOXI) injection 0.25 mg, 0.25 mg, Intravenous,  Once, 1 of 6 cycles Administration: 0.25 mg (04/25/2018) CARBOplatin (PARAPLATIN) 570 mg in sodium chloride 0.9 % 250 mL chemo infusion, 570 mg (100 % of original dose 573.5 mg), Intravenous,  Once, 1 of 6 cycles Dose modification:   (original dose 573.5 mg, Cycle 1) Administration: 570 mg (04/25/2018) PACLitaxel (TAXOL) 342 mg in sodium chloride 0.9 % 500 mL chemo infusion (> 80mg /m2), 175 mg/m2 = 342 mg, Intravenous,  Once, 1 of 6 cycles Administration: 342 mg (04/25/2018)  for chemotherapy treatment.        INTERVAL HISTORY Patient presents today for follow-up with labs after cycle 1 of carbotaxol on 04/25/18.  Patient had follow-up appointment with Dr. Fransisca Connors today for recent [place] BSO on 04/04/18.  Per his assessment today he recommends decreasing Taxol to 135 mg per meters squared with additional cycles.  If she continues to tolerate poorly he recommends decreasing chemo to 3 cycles and then doing vaginal brachii therapy.  States today she is not "doing well".  Has pain at port and laparoscopic sites, burning with urination and bowel movements, raw and metallic taste in mouth and burning in bilateral feet that Is impacting her ability to walk.  States all symptoms began essentially a day after her chemotherapy.  States "I am not doing any more chemotherapy if this is how it will be".  Admits to palpitations with Decadron.  Review of Systems  Constitutional: Positive for malaise/fatigue. Negative for chills, fever and weight loss.  HENT: Negative for congestion and ear pain.   Eyes: Negative.  Negative for blurred vision and double vision.    Respiratory: Negative.  Negative for cough, sputum production and shortness of breath.   Cardiovascular: Negative.  Negative for chest pain, palpitations and leg swelling.  Gastrointestinal: Positive for heartburn. Negative for abdominal pain, constipation, diarrhea, nausea and vomiting.  Genitourinary: Negative for dysuria, frequency and urgency.  Musculoskeletal: Positive for myalgias. Negative for back pain and falls.  Skin: Positive for rash.  Neurological: Positive for sensory change. Negative for weakness and headaches.  Endo/Heme/Allergies: Negative.  Does not bruise/bleed easily.  Psychiatric/Behavioral: Negative for depression. The patient is nervous/anxious. The patient does not have insomnia.     MEDICAL HISTORY:  Past Medical History:  Diagnosis Date  . Arthritis   . Cancer [facility])    endometrial  . Endometrial mass   . History of chicken pox   . History of kidney stones   . History of measles   . History of mumps   . Osteoporosis   . PMB (postmenopausal bleeding)   . Psoriasis     SURGICAL HISTORY: Past Surgical History:  Procedure Laterality Date  . APPENDECTOMY  1960  . BREAST SURGERY Left    biopsy  . Injection varicose veins in legs Bilateral 2010   Dr. Hulda Humphrey  . LITHOTRIPSY  1994, 2004   for renal stones  . PORTA CATH INSERTION N/A 04/24/2018   Procedure: PORTA CATH INSERTION;  Surgeon: Katha Cabal, MD;  Location: [place] [facility];  Service: Cardiovascular;  Laterality: N/A;  . ROBOTIC ASSISTED TOTAL HYSTERECTOMY Bilateral 04/04/2018   Procedure: ROBOTIC ASSISTED TOTAL HYSTERECTOMY,SENTINEL LYMPH NODE MAPPING AND BIOPSIES,AORTIC LYMPH NODE DISSECTION;  Surgeon: Gillis Ends, MD;  Location: [facility];  Service: Gynecology;  Laterality: Bilateral;  . TUBAL LIGATION  1980    SOCIAL HISTORY: Social History   Socioeconomic History  . Marital status: Single    Spouse name: Not on file  . Number of children: 1  . Years of education: Not  on file  . Highest education level: Not on file  Occupational History  . Occupation: Employed    Comment: Works at a [employer] doing [employer]  Social Needs  . Financial resource strain: Not on file  . Food insecurity:    Worry: Not on file    Inability: Not on file  . Transportation needs:    Medical: Not on file    Non-medical: Not on file  Tobacco Use  . Smoking status: Current Every Day Smoker    Packs/day: 0.25    Types: Cigarettes  . Smokeless tobacco: Never Used  Substance and Sexual Activity  . Alcohol use: No    Alcohol/week: 0.0 oz  . Drug use: No  . Sexual activity: Not Currently  Lifestyle  . Physical activity:    Days per week: Not on file    Minutes per session: Not on file  . Stress: Not on file  Relationships  . Social connections:    Talks on phone: Not on file    Gets together: Not on file    Attends religious service: Not on file    Active member of club or organization: Not on file    Attends meetings of clubs or organizations: Not on file    Relationship status: Not on file  . Intimate partner violence:    Fear  of current or ex partner: Not on file    Emotionally abused: Not on file    Physically abused: Not on file    Forced sexual activity: Not on file  Other Topics Concern  . Not on file  Social History Narrative  . Not on file    FAMILY HISTORY: Family History  Problem Relation Age of Onset  . Breast cancer Mother   . Transient ischemic attack Mother   . Arthritis Mother   . Hypothyroidism Mother   . Lung cancer Mother   . Diabetes Father        type 2  . Hypertension Father   . Arthritis Father   . Colon cancer Sister   . Prostate cancer Brother   . Hyperlipidemia Son   . Stomach cancer Maternal Grandmother   . Stroke Maternal Grandfather     ALLERGIES:  has No Known Allergies.  MEDICATIONS:  Current Outpatient Medications  Medication Sig Dispense Refill  . acetaminophen (TYLENOL) 325 MG tablet Take 650 mg by mouth every 6  (six) hours as needed.    . betamethasone dipropionate (DIPROLENE) 0.05 % cream Apply topically 2 (two) times daily. As directed for psoriasis on body. (Patient taking differently: Apply 1 application topically 2 (two) times daily as needed (for psoriasis on body). ) 30 g 1  . desonide (DESOWEN) 0.05 % cream Apply once a day sparingly to seborrhea as needed on face. (Patient taking differently: Apply 1 application topically 2 (two) times daily as needed (for psoriasis on face). ) 30 g 1  . dexamethasone (DECADRON) 4 MG tablet Take 2 tablets (8 mg total) by mouth daily. Start the day after chemotherapy for 2 days. 30 tablet 1  . ibuprofen (ADVIL,MOTRIN) 800 MG tablet Take 1 tablet (800 mg total) by mouth every 6 (six) hours. 65 tablet 1  . lidocaine-prilocaine (EMLA) cream Apply to affected area once 30 g 3  . Multiple Vitamins-Minerals (CENTRUM SILVER ADULT 50+ PO) Take 1 tablet by mouth daily.    . ondansetron (ZOFRAN) 8 MG tablet Take 1 tablet (8 mg total) by mouth 2 (two) times daily as needed for refractory nausea / vomiting. 30 tablet 1  . prochlorperazine (COMPAZINE) 10 MG tablet Take 1 tablet (10 mg total) by mouth every 6 (six) hours as needed (Nausea or vomiting). 30 tablet 1  . ranitidine (ZANTAC) 150 MG tablet Take 150 mg by mouth daily.    Marland Kitchen antiseptic oral rinse (BIOTENE) LIQD 15 mLs by Mouth Rinse route as needed for dry mouth. 1 Bottle 1  . magic mouthwash w/lidocaine SOLN Take 5 mLs by mouth 4 (four) times daily. 240 mL 0  . oxyCODONE (ROXICODONE) 5 MG immediate release tablet Take 1 tablet (5 mg total) by mouth every 4 (four) hours as needed. (Patient not taking: Reported on 04/24/2018) 16 tablet 0  . traMADol (ULTRAM) 50 MG tablet Take 2 tablets (100 mg total) by mouth every 12 (twelve) hours as needed for moderate pain. 30 tablet 0  . triamcinolone (KENALOG) 0.025 % cream Apply 1 application topically 2 (two) times daily. 30 g 0   No current facility-administered medications for  this visit.      PHYSICAL EXAMINATION: ECOG PERFORMANCE STATUS: 0 - Asymptomatic There were no vitals filed for this visit. There were no vitals filed for this visit.  Physical Exam  Constitutional: She is oriented to person, place, and time. Vital signs are normal. She appears well-developed and well-nourished.  HENT:  Head: Normocephalic and  atraumatic.  Mouth/Throat: Oropharynx is clear and moist and mucous membranes are normal.  Eyes: Pupils are equal, round, and reactive to light.  Neck: Normal range of motion.  Cardiovascular: Normal rate, regular rhythm and normal heart sounds.  No murmur heard. Pulmonary/Chest: Effort normal and breath sounds normal. She has no wheezes.  Abdominal: Soft. Normal appearance and bowel sounds are normal. She exhibits no distension. There is no tenderness.  Musculoskeletal: Normal range of motion. She exhibits no edema.  Neurological: She is alert and oriented to person, place, and time.  Skin: Skin is warm, dry and intact. Rash noted.     Psychiatric: Judgment normal.     LABORATORY DATA:  I have reviewed the data as listed Lab Results  Component Value Date   WBC 5.0 05/02/2018   HGB 13.1 05/02/2018   HCT 38.5 05/02/2018   MCV 90.1 05/02/2018   PLT 208 05/02/2018   Recent Labs    04/03/18 1210 04/25/18 0917 05/02/18 1003  NA 140 137 138  K 3.6 3.6 4.1  CL 106 108 105  CO2 24 23 23   GLUCOSE 79 137* 137*  BUN 15 22* 27*  CREATININE 0.75 0.77 0.66  CALCIUM 9.1 8.7* 9.5  GFRNONAA >60 >60 >60  GFRAA >60 >60 >60  PROT 7.3 6.5 7.4  ALBUMIN 4.0 3.6 4.2  AST 17 22 26   ALT 13* 15 42  ALKPHOS 56 53 53  BILITOT 0.7 0.5 0.8   Iron/TIBC/Ferritin/ %Sat No results found for: IRON, TIBC, FERRITIN, IRONPCTSAT      ASSESSMENT & PLAN:  1. Endometrial cancer ([place])   2. Mouth dryness   3. Dermatitis   4. Xerostomia   5. Neuropathy   6. Pain at surgical site   7. Burning with urination    Stage IA carcinosarcoma: S/p cycle 1  of carbo/taxol. Given on 6/19. Reviewed labs with patient today.  She does not need Neupogen.  ANC is 3.1.  Maculopapular rash at Port-A-Cath site: RX 0.025% Kenalog cream. Appears to be from Tegaderm from her port being accessed.  Mildly pruritus.  Can take Benadryl PRN.   Xerostomia: Recommend Biotene daily. RX Magic mouthwash to help with burning sensation.  No sores visible. Complain of a burning sensation with all liquids.  Tolerates foods without difficulty.  Weight is stable today.  Neuropathy: Will speak with Dr. Tasia Catchings about a possible dose reduction given the patient complains of constant burning sensation in bilateral lower extremities.  Reluctant to give gabapentin or Cymbalta today.  Will wait and evaluate in 1 week to see if symptoms improve.  Pain: Both sites appear clean, dry and intact without signs of infection. Checked narcotic registry and last prescription given was for oxycodone post surgery. RX Tramadol 100 mg BID for surgical site pain, port a cath discomfort and neuropathy.  Provided enough for 6 days until she meets with Dr. Tasia Catchings next week.  Burning with urination: We will get UA and urine culture to rule out urinary tract infection.  Urinalysis not convincing for urinary tract infection.  She is been afebrile.  Will wait on culture. Culture reviewed and < 10,000 colonies of insignificant growth. She is afebrile.   Palpitations: Resolved after discontinuation of Decadron.   Indigestion/heartburn: Continue to use OTC medications.   Orders Placed This Encounter  Procedures  . Urine culture    Standing Status:   Future    Number of Occurrences:   1    Standing Expiration Date:   05/03/2019  .  Urinalysis, Complete w Microscopic    All questions were answered. The patient knows to call the clinic with any problems questions or concerns.  Return of visit:  Follow-up in 1 week for labs only.  Follow-up in 2 weeks with labs/MD assessment and Cycle 2 (Dose reduced).  Greater  than 50% was spent in counseling and coordination of care with this patient including but not limited to discussion of the relevant topics above (See A&P) including, but not limited to diagnosis and management of acute and chronic medical conditions.   Faythe Casa, NP 05/03/2018 12:10 PM

## 2018-05-02 NOTE — Progress Notes (Signed)
Pt had a bad week. She has heart rate increase when she started the steroids. She has pain at surgery site, pain at port site, pain and feels like her legs and feet on fire, indigestion.  When she urinates and has bowel movement she burns. One day she had vaginal bleeding on Monday and stopped by today. She had really bad indigestion and it is better since she started taking ranitidine.she does not want to take anymore chemo due to the bad side effects

## 2018-05-02 NOTE — Progress Notes (Signed)
Gynecologic Oncology Interval Visit   Referring Provider: Dr. Amalia Hailey  Chief Complaint: Endometrial Cancer  Subjective:  Katherine Snyder is a 67 y.o. female who was initially seen in consultation for Dr. Amalia Hailey for endometrial cancer, returns to clinic today for post-operative evaluation and discussion of pathology.   Her case was discussed at [facility] and discussed that recurrence risk without adjuvant treatment is up to 50%. So recommend adjuvant chemo and radiation.  She was seen by Dr. 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   Today, she reports that her plantar fasciitis has been much worse with more feet numbness since chemotherapy last week.  She received carboplatin AUC 5 and Taxol 175 mg per sq M.  Also has some continued discomfort in incisions and with moving bowels.    GYNECOLOGIC ONCOLOGY HISTORY:  Initially, patient presented to ER on 02/26/18 for PMB. She was reportedly 14 years post menopausal and had had a 2 day episode of vaginal bleeding that was constant.   02/26/18- Ultrasound Pelvic Complete w/ Transvaginal: Endometrium thickness up to 53mm. No focal solid or cystic change. IMPRESSION: Mildly enlarged uterus. No discrete fibroids. Markedly thickened endometrium. In the setting of post-menopausal bleeding, endometrial sampling is indicated to exclude carcinoma. If results are benign, sonohysterogram should be considered for focal lesion work-up. (Ref: Radiological Reasoning: Algorithmic Workup of Abnormal Vaginal Bleeding with Endovaginal Sonography and Sonohysterography. AJR 2008; 952:W41-32) Nonvisualization of the right ovary. Normal-appearing left ovary. No significant uterine fibroids were observed.  She was seen by Dr. Ellard Artis son 03/01/18 who performed vacurette endometrial biopsy. Uterus was sounded to length to 9 cm. She was started on Aygestin and bleeding stopped.   03/01/18-   Pathology: ENDOMETRIUM, BIOPSY: POORLY DIFFERENTIATED MALIGNANT NEOPLASM WITH FEATURES CONSISTENT WITH MALIGNANT MIXED MULLERIAN TUMOR (MMMT/CARCINOSARCOMA).  COMMENT:  BASED ON INITIAL HISTOLOGIC FINDINGS, IMMUNOHISTOCHEMISTRY IS EVALUATED:  THE SARCOMATOUS TUMOR COMPONENT IS DIFFUSELY POSITIVE FOR CD10 AND THE CARCINOMA COMPONENT IS POSITIVE FOR PANKERATIN, SUPPORTING THE DIAGNOSIS.   She was seen by Dr. Fransisca Connors, Gyn-Onc on 03/28/2018 for initial evaluation and treatment planning.  TLH BSO with sentinel lymph node mapping and biopsies and aortic lymph node dissection were discussed.  Surgery at [facility] was recommended but unfortunately, patient's insurance will not cover.  Discussed with Dr. Fransisca Connors and Dr. Theora Gianotti who gave okay for surgery at [facility] as soon as possible.   CA 125- 10.5  03/30/18 - CT C/A/P W CONTRAST IMPRESSION: 1. Mass like expansion of the endometrial cavity known primary endometrial carcinoma. 2. No evidence for nodal metastasis or solid organ metastasis within the abdomen or pelvis. 3. Scattered nonspecific solid nodules and a single part solid nodule identified in both lungs. Consensus criteria recommendations for nodule followup does not apply to patients with known primary malignancy. 4. Liver cysts.  She underwent CT Imaging and robot assisted total hysterectomy with sentinel LN mapping, biopsies, and aortic LN dissection with Dr. Theora Gianotti at [facility] 04/04/18.   A. UTERUS WITH CERVIX; HYSTERECTOMY:  - CARCINOSARCOMA.  - MYOMETRIAL INVASION PRESENT, LESS THAN HALF OF THE MYOMETRIUM.  - NEGATIVE FOR CERVICAL AND SEROSAL INVOLVEMENT.  - SUBMUCOSAL LEIOMYOMA.   FALLOPIAN TUBE AND OVARY, LEFT; SALPINGO-OOPHORECTOMY:  - NEGATIVE FOR MALIGNANCY.  - OVARIAN CORTICAL INCLUSION CYSTS.  - ATROPHIC FALLOPIAN TUBE.   B. SENTINEL LYMPH NODE, RIGHT PRIMARY OBTURATOR; EXCISION:  - NEGATIVE FOR MALIGNANCY, ONE LYMPH NODE (0/1).   C. LYMPH NODE, RIGHT EXTERNAL ILIAC VEIN;  EXCISION:   - NEGATIVE FOR MALIGNANCY, ONE LYMPH NODE (0/1).   D. FALLOPIAN TUBE AND OVARY, UNILATERAL (RIGHT); SALPINGO-OOPHORECTOMY:  - NEGATIVE FOR MALIGNANCY.  - OVARIAN CORTICAL INCLUSION CYSTS.  - ATROPHIC FALLOPIAN TUBE.   E. SENTINEL LYMPH NODE, RIGHT LOW PARA-AORTIC; EXCISION:  - NEGATIVE FOR MALIGNANCY, TWO LYMPH NODES (0/2).   F. SENTINEL LYMPH NODE, LEFT PRIMARY LOW PARA-AORTIC; EXCISION:  - NEGATIVE FOR MALIGNANCY, ONE LYMPH NODE (0/1).   DIAGNOSIS:  A. PELVIC WASHINGS:  - NEGATIVE FOR MALIGNANCY.  - MESOTHELIAL CELLS AND MACROPHAGES.   Problem List: Patient Active Problem List   Diagnosis Date Noted  . Goals of care, counseling/discussion 04/18/2018  . Endometrial cancer ([place])   . Seborrhea 05/13/2016  . History of kidney stones 06/26/2015  . OP (osteoporosis) 06/26/2015  . Plantar fasciitis 06/26/2015  . Current tobacco use 06/26/2015  . Chronic airway obstruction ([place]) 10/08/2009  . Psoriasis 09/30/2008  . 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 Menopausal symptom 09/21/2007  . Tobacco use 09/21/2007  . Awareness of heartbeats 09/21/2007    Past Medical History: Past Medical History:  Diagnosis Date  . Arthritis   . Cancer [facility])    endometrial  . Endometrial mass   . History of chicken pox   . History of kidney stones   . History of measles   . History of mumps   . Osteoporosis   . PMB (postmenopausal bleeding)   . Psoriasis     Past Surgical History: Past Surgical History:  Procedure Laterality Date  . APPENDECTOMY  1960  . BREAST SURGERY Left    biopsy  . Injection varicose veins in legs Bilateral 2010   Dr. Hulda Humphrey  . LITHOTRIPSY  1994, 2004   for renal stones  . PORTA CATH INSERTION N/A 04/24/2018   Procedure: PORTA CATH INSERTION;  Surgeon: Katha Cabal, MD;  Location: [facility];  Service: Cardiovascular;  Laterality: N/A;  . ROBOTIC ASSISTED TOTAL HYSTERECTOMY Bilateral 04/04/2018   Procedure: ROBOTIC  ASSISTED TOTAL HYSTERECTOMY,SENTINEL LYMPH NODE MAPPING AND BIOPSIES,AORTIC LYMPH NODE DISSECTION;  Surgeon: Gillis Ends, MD;  Location: [facility] ORS;  Service: Gynecology;  Laterality: Bilateral;  . TUBAL LIGATION  1980     OB History:  OB History  Gravida Para Term Preterm AB Living  1 1 1     1   SAB TAB Ectopic Multiple Live Births          1    # Outcome Date GA Lbr Len/2nd Weight Sex Delivery Anes PTL Lv  1 Term 68    M Vag-Spont   LIV  G1P1001 LMP:   Family History: Family History  Problem Relation Age of Onset  . Breast cancer Mother   . Transient ischemic attack Mother   . Arthritis Mother   . Hypothyroidism Mother   . Lung cancer Mother   . Diabetes Father        type 2  . Hypertension Father   . Arthritis Father   . Colon cancer Sister   . Prostate cancer Brother   . Hyperlipidemia Son   . Stomach cancer Maternal Grandmother   . Stroke Maternal Grandfather     Social History: Social History   Socioeconomic History  . Marital status: Single    Spouse name: Not on file  . Number of children: 1  . Years of education: Not on file  . Highest education level: Not on file  Occupational History  . Occupation: Employed  Comment: Works at a [employer] doing [employer]  Social Needs  . Financial resource strain: Not on file  . Food insecurity:    Worry: Not on file    Inability: Not on file  . Transportation needs:    Medical: Not on file    Non-medical: Not on file  Tobacco Use  . Smoking status: Current Every Day Smoker    Packs/day: 0.25    Types: Cigarettes  . Smokeless tobacco: Never Used  Substance and Sexual Activity  . Alcohol use: No    Alcohol/week: 0.0 oz  . Drug use: No  . Sexual activity: Not Currently  Lifestyle  . Physical activity:    Days per week: Not on file    Minutes per session: Not on file  . Stress: Not on file  Relationships  . Social connections:    Talks on phone: Not on file    Gets together: Not on file     Attends religious service: Not on file    Active member of club or organization: Not on file    Attends meetings of clubs or organizations: Not on file    Relationship status: Not on file  . Intimate partner violence:    Fear of current or ex partner: Not on file    Emotionally abused: Not on file    Physically abused: Not on file    Forced sexual activity: Not on file  Other Topics Concern  . Not on file  Social History Narrative  . Not on file    Allergies: No Known Allergies  Current Medications: Current Outpatient Medications  Medication Sig Dispense Refill  . acetaminophen (TYLENOL) 325 MG tablet Take 650 mg by mouth every 6 (six) hours as needed.    . betamethasone dipropionate (DIPROLENE) 0.05 % cream Apply topically 2 (two) times daily. As directed for psoriasis on body. (Patient taking differently: Apply 1 application topically 2 (two) times daily as needed (for psoriasis on body). ) 30 g 1  . desonide (DESOWEN) 0.05 % cream Apply once a day sparingly to seborrhea as needed on face. (Patient taking differently: Apply 1 application topically 2 (two) times daily as needed (for psoriasis on face). ) 30 g 1  . dexamethasone (DECADRON) 4 MG tablet Take 2 tablets (8 mg total) by mouth daily. Start the day after chemotherapy for 2 days. 30 tablet 1  . ibuprofen (ADVIL,MOTRIN) 800 MG tablet Take 1 tablet (800 mg total) by mouth every 6 (six) hours. 65 tablet 1  . lidocaine-prilocaine (EMLA) cream Apply to affected area once 30 g 3  . Multiple Vitamins-Minerals (CENTRUM SILVER ADULT 50+ PO) Take 1 tablet by mouth daily.    . ondansetron (ZOFRAN) 8 MG tablet Take 1 tablet (8 mg total) by mouth 2 (two) times daily as needed for refractory nausea / vomiting. 30 tablet 1  . oxyCODONE (ROXICODONE) 5 MG immediate release tablet Take 1 tablet (5 mg total) by mouth every 4 (four) hours as needed. (Patient not taking: Reported on 04/24/2018) 16 tablet 0  . prochlorperazine (COMPAZINE) 10 MG  tablet Take 1 tablet (10 mg total) by mouth every 6 (six) hours as needed (Nausea or vomiting). 30 tablet 1  . ranitidine (ZANTAC) 150 MG tablet Take 150 mg by mouth daily.     No current facility-administered medications for this visit.    Review of Systems General:  no complaints Skin: no complaints Eyes: no complaints HEENT: no complaints Breasts: no complaints Pulmonary: no complaints Cardiac:  no complaints Gastrointestinal: no complaints Genitourinary/Sexual: no complaints Ob/Gyn: no complaints Musculoskeletal: no complaints Hematology: no complaints Neurologic/Psych: no complaints  Objective:  Physical Examination:  Vitals:   05/02/18 1550  BP: 128/83  Pulse: 83  Resp: 18  Temp: 97.9 F (36.6 C)      ECOG Performance Status: 1 - Symptomatic but completely ambulatory  GENERAL: Patient is a well appearing female in no acute distress HEENT:  Sclerae anicteric.  Oropharynx clear and moist. No ulcerations or evidence of oropharyngeal candidiasis. Neck is supple. Dentures NODES:  No cervical, supraclavicular, or axillary lymphadenopathy palpated.  LUNGS:  Clear to auscultation bilaterally.  No wheezes or rhonchi. Smoker HEART:  Regular rate and rhythm. No murmur appreciated. ABDOMEN:  Soft, nontender.  Positive, normoactive bowel sounds. No organomegaly palpated. Incisions healing well. MSK:  No focal spinal tenderness to palpation. Full range of motion bilaterally in the upper extremities. EXTREMITIES:  No peripheral edema.   SKIN:  Clear with no obvious rashes or skin changes. No nail dyscrasia. NEURO:  Nonfocal. Well oriented.  Appropriate affect.  PELVIC: EGBUS: normal, Vagina: healing well.  Bimanual: normal.    Assessment:  Katherine Snyder is a 67 y.o. female diagnosed with stage IA uterine carcinosarcoma with RA TLH/BSO, negative staging 5/19 is here for post-op evaluation.  Healing well from surgery and plan is for 6 cycles of chemotherapy and then vaginal  brachytherapy.   Comorbidities complicating care: smoker plantar fasciitis  Plan:   Problem List Items Addressed This Visit      Genitourinary   Endometrial cancer ([place]) - Primary     Patient tolerated first cycle of chemotherapy poorly with increased neuropathy.  Will recommend decreasing taxol to 135 mg per sq M with next cycle.  If continues to tolerate poorly can consider truncating chemo to three cycles instead of 6 and then doing vaginal brachytherapy.  She will follow up with Dr Collie Siad NP [facility] Abide today and will plan on next chemo treatment in 2 weeks. We can see her back in three months.      Beckey Rutter, DNP, AGNP-C [place] at [facility] [phone] (work cell) [phone] (office)  I personally interviewed and examined the patient. Agreed with the above/below plan of care. Patient/family questions were answered.  Mellody Drown, MD  CC:  [place], [place] [address] [place], [place] [address] [phone]

## 2018-05-03 LAB — URINE CULTURE: Culture: <10000 — AB

## 2018-05-09 ENCOUNTER — Other Ambulatory Visit: Payer: Self-pay | Admitting: Oncology

## 2018-05-09 ENCOUNTER — Inpatient Hospital Stay: Payer: BLUE CROSS/BLUE SHIELD | Attending: Oncology

## 2018-05-09 DIAGNOSIS — Z5189 Encounter for other specified aftercare: Secondary | ICD-10-CM | POA: Insufficient documentation

## 2018-05-09 DIAGNOSIS — D702 Other drug-induced agranulocytosis: Secondary | ICD-10-CM | POA: Insufficient documentation

## 2018-05-09 DIAGNOSIS — D72829 Elevated white blood cell count, unspecified: Secondary | ICD-10-CM | POA: Insufficient documentation

## 2018-05-09 DIAGNOSIS — C541 Malignant neoplasm of endometrium: Secondary | ICD-10-CM | POA: Diagnosis present

## 2018-05-09 DIAGNOSIS — Z5111 Encounter for antineoplastic chemotherapy: Secondary | ICD-10-CM | POA: Insufficient documentation

## 2018-05-09 LAB — COMPREHENSIVE METABOLIC PANEL
ALT: 33 U/L (ref 0–44)
AST: 29 U/L (ref 15–41)
Albumin: 3.9 g/dL (ref 3.5–5.0)
Alkaline Phosphatase: 64 U/L (ref 38–126)
Anion gap: 9 (ref 5–15)
BUN: 17 mg/dL (ref 8–23)
CO2: 24 mmol/L (ref 22–32)
Calcium: 9.4 mg/dL (ref 8.9–10.3)
Chloride: 103 mmol/L (ref 98–111)
Creatinine, Ser: 0.86 mg/dL (ref 0.44–1.00)
GFR calc Af Amer: >60 mL/min (ref >60)
GFR calc non Af Amer: >60 mL/min (ref >60)
Glucose, Bld: 126 mg/dL — ABNORMAL HIGH (ref 70–99)
Potassium: 3.7 mmol/L (ref 3.5–5.1)
Sodium: 136 mmol/L (ref 135–145)
Total Bilirubin: 0.3 mg/dL (ref 0.3–1.2)
Total Protein: 7.1 g/dL (ref 6.5–8.1)

## 2018-05-09 LAB — CBC WITH DIFFERENTIAL/PLATELET
Basophils Absolute: 0 10*3/uL (ref 0–0.1)
Basophils Relative: 1 %
Eosinophils Absolute: 0.1 10*3/uL (ref 0–0.7)
Eosinophils Relative: 2 %
HCT: 36.9 % (ref 35.0–47.0)
Hemoglobin: 12.6 g/dL (ref 12.0–16.0)
Lymphocytes Relative: 60 %
Lymphs Abs: 2.1 10*3/uL (ref 1.0–3.6)
MCH: 30.7 pg (ref 26.0–34.0)
MCHC: 34.2 g/dL (ref 32.0–36.0)
MCV: 89.7 fL (ref 80.0–100.0)
Monocytes Absolute: 0.7 10*3/uL (ref 0.2–0.9)
Monocytes Relative: 21 %
Neutro Abs: 0.6 10*3/uL — ABNORMAL LOW (ref 1.4–6.5)
Neutrophils Relative %: 16 %
Platelets: 172 10*3/uL (ref 150–440)
RBC: 4.11 MIL/uL (ref 3.80–5.20)
RDW: 13 % (ref 11.5–14.5)
WBC: 3.5 10*3/uL — ABNORMAL LOW (ref 3.6–11.0)

## 2018-05-11 ENCOUNTER — Other Ambulatory Visit: Payer: Self-pay | Admitting: Oncology

## 2018-05-14 ENCOUNTER — Inpatient Hospital Stay: Payer: BLUE CROSS/BLUE SHIELD

## 2018-05-14 DIAGNOSIS — D702 Other drug-induced agranulocytosis: Secondary | ICD-10-CM

## 2018-05-14 DIAGNOSIS — Z5111 Encounter for antineoplastic chemotherapy: Secondary | ICD-10-CM | POA: Diagnosis not present

## 2018-05-14 MED ORDER — TBO-FILGRASTIM 480 MCG/0.8ML ~~LOC~~ SOSY
480.0000 ug | PREFILLED_SYRINGE | Freq: Once | SUBCUTANEOUS | Status: AC
  Administered 2018-05-14: 480 ug via SUBCUTANEOUS

## 2018-05-15 ENCOUNTER — Inpatient Hospital Stay: Payer: BLUE CROSS/BLUE SHIELD

## 2018-05-15 DIAGNOSIS — D702 Other drug-induced agranulocytosis: Secondary | ICD-10-CM

## 2018-05-15 DIAGNOSIS — Z5111 Encounter for antineoplastic chemotherapy: Secondary | ICD-10-CM | POA: Diagnosis not present

## 2018-05-15 MED ORDER — TBO-FILGRASTIM 480 MCG/0.8ML ~~LOC~~ SOSY
480.0000 ug | PREFILLED_SYRINGE | Freq: Once | SUBCUTANEOUS | Status: AC
  Administered 2018-05-15: 480 ug via SUBCUTANEOUS

## 2018-05-16 ENCOUNTER — Other Ambulatory Visit: Payer: Self-pay

## 2018-05-16 ENCOUNTER — Inpatient Hospital Stay: Payer: BLUE CROSS/BLUE SHIELD

## 2018-05-16 ENCOUNTER — Inpatient Hospital Stay (HOSPITAL_BASED_OUTPATIENT_CLINIC_OR_DEPARTMENT_OTHER): Payer: BLUE CROSS/BLUE SHIELD | Admitting: Oncology

## 2018-05-16 ENCOUNTER — Encounter: Payer: Self-pay | Admitting: Oncology

## 2018-05-16 VITALS — BP 107/75 | HR 72 | Temp 95.9°F | Resp 18 | Wt 174.9 lb

## 2018-05-16 DIAGNOSIS — Z0000 Encounter for general adult medical examination without abnormal findings: Secondary | ICD-10-CM | POA: Insufficient documentation

## 2018-05-16 DIAGNOSIS — Z720 Tobacco use: Secondary | ICD-10-CM | POA: Diagnosis not present

## 2018-05-16 DIAGNOSIS — G629 Polyneuropathy, unspecified: Secondary | ICD-10-CM | POA: Insufficient documentation

## 2018-05-16 DIAGNOSIS — D72829 Elevated white blood cell count, unspecified: Secondary | ICD-10-CM

## 2018-05-16 DIAGNOSIS — D702 Other drug-induced agranulocytosis: Secondary | ICD-10-CM | POA: Diagnosis not present

## 2018-05-16 DIAGNOSIS — Z716 Tobacco abuse counseling: Secondary | ICD-10-CM | POA: Insufficient documentation

## 2018-05-16 DIAGNOSIS — M79671 Pain in right foot: Secondary | ICD-10-CM | POA: Insufficient documentation

## 2018-05-16 DIAGNOSIS — R5383 Other fatigue: Secondary | ICD-10-CM | POA: Insufficient documentation

## 2018-05-16 DIAGNOSIS — C541 Malignant neoplasm of endometrium: Secondary | ICD-10-CM

## 2018-05-16 DIAGNOSIS — Z5111 Encounter for antineoplastic chemotherapy: Secondary | ICD-10-CM | POA: Diagnosis not present

## 2018-05-16 LAB — COMPREHENSIVE METABOLIC PANEL
ALT: 16 U/L (ref 0–44)
AST: 23 U/L (ref 15–41)
Albumin: 3.7 g/dL (ref 3.5–5.0)
Alkaline Phosphatase: 91 U/L (ref 38–126)
Anion gap: 11 (ref 5–15)
BUN: 14 mg/dL (ref 8–23)
CO2: 22 mmol/L (ref 22–32)
Calcium: 8.8 mg/dL — ABNORMAL LOW (ref 8.9–10.3)
Chloride: 106 mmol/L (ref 98–111)
Creatinine, Ser: 0.91 mg/dL (ref 0.44–1.00)
GFR calc Af Amer: >60 mL/min (ref >60)
GFR calc non Af Amer: >60 mL/min (ref >60)
Glucose, Bld: 140 mg/dL — ABNORMAL HIGH (ref 70–99)
Potassium: 3.4 mmol/L — ABNORMAL LOW (ref 3.5–5.1)
Sodium: 139 mmol/L (ref 135–145)
Total Bilirubin: 0.3 mg/dL (ref 0.3–1.2)
Total Protein: 6.9 g/dL (ref 6.5–8.1)

## 2018-05-16 LAB — CBC WITH DIFFERENTIAL/PLATELET
Basophils Absolute: 0.3 10*3/uL — ABNORMAL HIGH (ref 0–0.1)
Basophils Relative: 1 %
Eosinophils Absolute: 0.2 10*3/uL (ref 0–0.7)
Eosinophils Relative: 0 %
HCT: 36.1 % (ref 35.0–47.0)
Hemoglobin: 12.3 g/dL (ref 12.0–16.0)
Lymphocytes Relative: 7 %
Lymphs Abs: 3.1 10*3/uL (ref 1.0–3.6)
MCH: 30.7 pg (ref 26.0–34.0)
MCHC: 34 g/dL (ref 32.0–36.0)
MCV: 90.2 fL (ref 80.0–100.0)
Monocytes Absolute: 1.7 10*3/uL — ABNORMAL HIGH (ref 0.2–0.9)
Monocytes Relative: 4 %
Neutro Abs: 38.6 10*3/uL — ABNORMAL HIGH (ref 1.4–6.5)
Neutrophils Relative %: 88 %
Platelets: 237 10*3/uL (ref 150–440)
RBC: 4 MIL/uL (ref 3.80–5.20)
RDW: 13.9 % (ref 11.5–14.5)
WBC: 43.9 10*3/uL — ABNORMAL HIGH (ref 3.6–11.0)

## 2018-05-16 MED ORDER — TRAMADOL HCL 50 MG PO TABS: 100.0000 mg | ORAL_TABLET | Freq: Three times a day (TID) | ORAL | 0 refills | Status: DC | PRN

## 2018-05-16 MED ORDER — DIPHENHYDRAMINE HCL 50 MG/ML IJ SOLN
50.0000 mg | Freq: Once | INTRAMUSCULAR | Status: AC
  Administered 2018-05-16: 50 mg via INTRAVENOUS
  Filled 2018-05-16: qty 1

## 2018-05-16 MED ORDER — SODIUM CHLORIDE 0.9 % IV SOLN
Freq: Once | INTRAVENOUS | Status: AC
  Administered 2018-05-16: 10:00:00 via INTRAVENOUS
  Filled 2018-05-16: qty 1000

## 2018-05-16 MED ORDER — SODIUM CHLORIDE 0.9 % IV SOLN
467.5500 mg | Freq: Once | INTRAVENOUS | Status: AC
  Administered 2018-05-16: 470 mg via INTRAVENOUS
  Filled 2018-05-16: qty 47

## 2018-05-16 MED ORDER — SODIUM CHLORIDE 0.9 % IV SOLN
150.0000 mg/m2 | Freq: Once | INTRAVENOUS | Status: AC
  Administered 2018-05-16: 294 mg via INTRAVENOUS
  Filled 2018-05-16: qty 49

## 2018-05-16 MED ORDER — HEPARIN SOD (PORK) LOCK FLUSH 100 UNIT/ML IV SOLN
500.0000 [IU] | Freq: Once | INTRAVENOUS | Status: AC | PRN
  Administered 2018-05-16: 500 [IU]
  Filled 2018-05-16: qty 5

## 2018-05-16 MED ORDER — FAMOTIDINE IN NACL 20-0.9 MG/50ML-% IV SOLN
20.0000 mg | Freq: Once | INTRAVENOUS | Status: AC
  Administered 2018-05-16: 20 mg via INTRAVENOUS
  Filled 2018-05-16: qty 50

## 2018-05-16 MED ORDER — SODIUM CHLORIDE 0.9 % IV SOLN
20.0000 mg | Freq: Once | INTRAVENOUS | Status: AC
  Administered 2018-05-16: 20 mg via INTRAVENOUS
  Filled 2018-05-16: qty 2

## 2018-05-16 MED ORDER — PALONOSETRON HCL INJECTION 0.25 MG/5ML
0.2500 mg | Freq: Once | INTRAVENOUS | Status: AC
  Administered 2018-05-16: 0.25 mg via INTRAVENOUS
  Filled 2018-05-16: qty 5

## 2018-05-16 MED ORDER — POTASSIUM CHLORIDE ER 10 MEQ PO TBCR: 10.0000 meq | EXTENDED_RELEASE_TABLET | Freq: Every day | ORAL | 0 refills | Status: DC

## 2018-05-16 NOTE — Progress Notes (Signed)
Hematology/Oncology Follow up  note [facility] Telephone:([phone]) [phone] Fax:([phone]) [phone]   Patient Care Team: Chrismon, Vickki Muff, PA as PCP - General (Family Medicine) REASON FOR VISIT Follow up for treatment of uterus carcinosarcoma  HISTORY OF PRESENTING ILLNESS:  Katherine Snyder is a  67 y.o.  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 Pathology showed  A uterus with cervix hysterectomy -Carcinosarcoma B sentinel lymph node, right primary obturator negative C lymph node right external iliac lymph negative D fallopian tube and ovary, unilateral right negative E sentinel lymph node right low periaortic: Negative F sentinel lymph node left low periaortic negative  CANCER CASE SUMMARY: ENDOMETRIUM  Procedure: Total hysterectomy and bilateral salpingo-oophorectomy  Histologic Type: Carcinosarcoma  Histologic Grade: Not applicable  Myometrial Invasion: Present    Depth of myometrial invasion cannot be determined due to exophytic  tumor growth and effacement of endometrial/myometrial junction    Myometrial thickness: At least 8 mm    Percentage depth of myometrial invasion: Estimated less than 50%  Uterine Serosa Involvement: Not identified  Cervical Stromal Involvement: Not identified  Other Tissue/Organ Involvement: Not identified  Peritoneal/ Ascitic Fluid: Negative for malignancy  Lymphovascular Invasion: Present  Regional Lymph Nodes: All lymph nodes negative for tumor cells  Number of Lymph Nodes Examined: 5    Total number of pelvic nodes examined: 2    Number of pelvic sentinel nodes examined: 1    Total number of para-aortic nodes examined: 3    Number of para-aortic sentinel nodes examined: 3   Pathologic Stage Classification (pTNM, AJCC  8th Edition) (Note J): pT1a  pN0/FIGO IA  Today patient reports feeling well.  Denies any pain.  # Patient's case was discussed and GYN tumor board today.  Consensus decision was to proceed with adjuvant chemotherapy with carboplatin and Taxol for 6 cycles, followed by vaginal brachy therapy.   INTERVAL HISTORY Katherine Snyder is a 67 y.o. female who has above history reviewed by me today presents for assessment prior to chemotherapy management for stage IA endometrial carcinosarcoma. During cycle 1 treatment, reported feeling burning down and having really bad heartburn.  Taxol was temporarily stopped.  The patient was given Benadryl, Solu-Medrol, Pepcid, symptoms improved and resolved.  Taxol was restarted and patient is able to finish her treatment. Patient reports feeling extremely tired for 2 days after chemotherapy.  She was seen by nurse practitioner Sonia Baller during interval.   She complained burning sensation with urination and had UA and urine culture obtained.  UA was not convincing for UTI urine culture showed less than 10,000 colonies of insignificant growth.  Patient remains afebrile. Patient also reports her chronic fasciitis of foot has got a lot worse since the start of chemotherapy.  Possible neuropathy was discussed and the patient is reluctant to start gabapentin or Cymbalta.  She was given tramadol 100 mg twice daily.  Patient reports symptoms gradually improved.    Patient was found to have neutropenia so she received Neupogen x 2. She reports bone pain started after Neupogen shots.   Fatigue was severe for a few days after chemotherapy, gradually improved.  Denies any nausea vomiting.  Appetite is good.  Patient appears to be very anxious and emotional.  She says "I am not going to do any more chemotherapy if this is how it will be".  Review  of Systems  Constitutional: Positive for malaise/fatigue. Negative for chills, fever and weight loss.  HENT: Negative for congestion, ear  discharge, ear pain, nosebleeds, sinus pain and sore throat.   Eyes: Negative for double vision, photophobia, pain, discharge and redness.  Respiratory: Negative for cough, hemoptysis, sputum production, shortness of breath and wheezing.   Cardiovascular: Negative for chest pain, palpitations, orthopnea, claudication and leg swelling.  Gastrointestinal: Negative for abdominal pain, blood in stool, constipation, diarrhea, heartburn, melena, nausea and vomiting.  Genitourinary: Negative for dysuria, flank pain, frequency and hematuria.  Musculoskeletal: Negative for back pain, myalgias and neck pain.  Skin: Negative for itching and rash.  Neurological: Negative for dizziness, tremors, focal weakness, weakness and headaches.       Bilateral lower extremity pain, more on the right.  History of chronic fasciitis   Endo/Heme/Allergies: Negative for environmental allergies. Does not bruise/bleed easily.  Psychiatric/Behavioral: Negative for depression and hallucinations. The patient is nervous/anxious.     MEDICAL HISTORY:  Past Medical History:  Diagnosis Date  . Arthritis   . Cancer [facility])    endometrial  . Endometrial mass   . History of chicken pox   . History of kidney stones   . History of measles   . History of mumps   . Osteoporosis   . PMB (postmenopausal bleeding)   . Psoriasis     SURGICAL HISTORY: Past Surgical History:  Procedure Laterality Date  . APPENDECTOMY  1960  . BREAST SURGERY Left    biopsy  . Injection varicose veins in legs Bilateral 2010   Dr. Hulda Humphrey  . LITHOTRIPSY  1994, 2004   for renal stones  . PORTA CATH INSERTION N/A 04/24/2018   Procedure: PORTA CATH INSERTION;  Surgeon: Katha Cabal, MD;  Location: [place] [facility];  Service: Cardiovascular;  Laterality: N/A;  . ROBOTIC ASSISTED TOTAL HYSTERECTOMY Bilateral 04/04/2018   Procedure: ROBOTIC ASSISTED TOTAL HYSTERECTOMY,SENTINEL LYMPH NODE MAPPING AND BIOPSIES,AORTIC LYMPH NODE DISSECTION;   Surgeon: Gillis Ends, MD;  Location: [facility];  Service: Gynecology;  Laterality: Bilateral;  . TUBAL LIGATION  1980    SOCIAL HISTORY: Social History   Socioeconomic History  . Marital status: Single    Spouse name: Not on file  . Number of children: 1  . Years of education: Not on file  . Highest education level: Not on file  Occupational History  . Occupation: Employed    Comment: Works at a [employer] doing [employer]  Social Needs  . Financial resource strain: Not on file  . Food insecurity:    Worry: Not on file    Inability: Not on file  . Transportation needs:    Medical: Not on file    Non-medical: Not on file  Tobacco Use  . Smoking status: Current Every Day Smoker    Packs/day: 0.25    Types: Cigarettes  . Smokeless tobacco: Never Used  Substance and Sexual Activity  . Alcohol use: No    Alcohol/week: 0.0 oz  . Drug use: No  . Sexual activity: Not Currently  Lifestyle  . Physical activity:    Days per week: Not on file    Minutes per session: Not on file  . Stress: Not on file  Relationships  . Social connections:    Talks on phone: Not on file    Gets together: Not on file    Attends religious service: Not on file    Active member of club or organization: Not on file  Attends meetings of clubs or organizations: Not on file    Relationship status: Not on file  . Intimate partner violence:    Fear of current or ex partner: Not on file    Emotionally abused: Not on file    Physically abused: Not on file    Forced sexual activity: Not on file  Other Topics Concern  . Not on file  Social History Narrative  . Not on file    FAMILY HISTORY: Family History  Problem Relation Age of Onset  . Breast cancer Mother   . Transient ischemic attack Mother   . Arthritis Mother   . Hypothyroidism Mother   . Lung cancer Mother   . Diabetes Father        type 2  . Hypertension Father   . Arthritis Father   . Colon cancer Sister   . Prostate  cancer Brother   . Hyperlipidemia Son   . Stomach cancer Maternal Grandmother   . Stroke Maternal Grandfather     ALLERGIES:  has No Known Allergies.  MEDICATIONS:  Current Outpatient Medications  Medication Sig Dispense Refill  . acetaminophen (TYLENOL) 325 MG tablet Take 650 mg by mouth every 6 (six) hours as needed.    Marland Kitchen antiseptic oral rinse (BIOTENE) LIQD 15 mLs by Mouth Rinse route as needed for dry mouth. 1 Bottle 1  . betamethasone dipropionate (DIPROLENE) 0.05 % cream Apply topically 2 (two) times daily. As directed for psoriasis on body. (Patient taking differently: Apply 1 application topically 2 (two) times daily as needed (for psoriasis on body). ) 30 g 1  . desonide (DESOWEN) 0.05 % cream Apply once a day sparingly to seborrhea as needed on face. (Patient taking differently: Apply 1 application topically 2 (two) times daily as needed (for psoriasis on face). ) 30 g 1  . dexamethasone (DECADRON) 4 MG tablet Take 2 tablets (8 mg total) by mouth daily. Start the day after chemotherapy for 2 days. 30 tablet 1  . lidocaine-prilocaine (EMLA) cream Apply to affected area once 30 g 3  . Multiple Vitamins-Minerals (CENTRUM SILVER ADULT 50+ PO) Take 1 tablet by mouth daily.    . ondansetron (ZOFRAN) 8 MG tablet Take 1 tablet (8 mg total) by mouth 2 (two) times daily as needed for refractory nausea / vomiting. 30 tablet 1  . prochlorperazine (COMPAZINE) 10 MG tablet Take 1 tablet (10 mg total) by mouth every 6 (six) hours as needed (Nausea or vomiting). 30 tablet 1  . ranitidine (ZANTAC) 150 MG tablet Take 150 mg by mouth daily.    . traMADol (ULTRAM) 50 MG tablet Take 2 tablets (100 mg total) by mouth 3 (three) times daily as needed for moderate pain. 60 tablet 0  . triamcinolone (KENALOG) 0.025 % cream Apply 1 application topically 2 (two) times daily. 30 g 0  . magic mouthwash w/lidocaine SOLN Take 5 mLs by mouth 4 (four) times daily. (Patient not taking: Reported on 05/16/2018) 240 mL 0    . oxyCODONE (ROXICODONE) 5 MG immediate release tablet Take 1 tablet (5 mg total) by mouth every 4 (four) hours as needed. (Patient not taking: Reported on 04/24/2018) 16 tablet 0  . potassium chloride (K-DUR) 10 MEQ tablet Take 1 tablet (10 mEq total) by mouth daily. 21 tablet 0   No current facility-administered medications for this visit.    Facility-Administered Medications Ordered in Other Visits  Medication Dose Route Frequency Provider Last Rate Last Dose  . CARBOplatin (PARAPLATIN) 470 mg in  sodium chloride 0.9 % 250 mL chemo infusion  470 mg Intravenous Once Earlie Server, MD      . dexamethasone (DECADRON) 20 mg in sodium chloride 0.9 % 50 mL IVPB  20 mg Intravenous Once Earlie Server, MD      . diphenhydrAMINE (BENADRYL) injection 50 mg  50 mg Intravenous Once Earlie Server, MD      . famotidine (PEPCID) IVPB 20 mg premix  20 mg Intravenous Once Earlie Server, MD      . heparin lock flush 100 unit/mL  500 Units Intracatheter Once PRN Earlie Server, MD      . PACLitaxel (TAXOL) 294 mg in sodium chloride 0.9 % 250 mL chemo infusion (> 80mg /m2)  150 mg/m2 (Treatment Plan Recorded) Intravenous Once Earlie Server, MD      . palonosetron (ALOXI) injection 0.25 mg  0.25 mg Intravenous Once Earlie Server, MD         PHYSICAL EXAMINATION: ECOG PERFORMANCE STATUS: 0 - Asymptomatic Vitals:   05/16/18 0828  BP: 107/75  Pulse: 72  Resp: 18  Temp: (!) 95.9 F (35.5 C)   Filed Weights   05/16/18 0828  Weight: 174 lb 14.4 oz (79.3 kg)    Physical Exam  Constitutional: She is oriented to person, place, and time. She appears well-developed and well-nourished. No distress.  HENT:  Head: Normocephalic and atraumatic.  Right Ear: External ear normal.  Left Ear: External ear normal.  Mouth/Throat: Oropharynx is clear and moist.  Eyes: Pupils are equal, round, and reactive to light. Conjunctivae and EOM are normal. No scleral icterus.  Neck: Normal range of motion. Neck supple.  Cardiovascular: Normal rate, regular  rhythm and normal heart sounds.  Pulmonary/Chest: Effort normal and breath sounds normal. No respiratory distress. She has no wheezes. She has no rales. She exhibits no tenderness.  Abdominal: Soft. Bowel sounds are normal. She exhibits no distension and no mass. There is no tenderness.  Musculoskeletal: Normal range of motion. She exhibits no edema or deformity.  Lymphadenopathy:    She has no cervical adenopathy.  Neurological: She is alert and oriented to person, place, and time. She displays normal reflexes. No cranial nerve deficit or sensory deficit. Coordination normal.  Skin: Skin is warm and dry. No rash noted.  Psychiatric: She has a normal mood and affect. Her behavior is normal. Thought content normal.     LABORATORY DATA:  I have reviewed the data as listed Lab Results  Component Value Date   WBC 43.9 (H) 05/16/2018   HGB 12.3 05/16/2018   HCT 36.1 05/16/2018   MCV 90.2 05/16/2018   PLT 237 05/16/2018   Recent Labs    05/02/18 1003 05/09/18 1348 05/16/18 0752  NA 138 136 139  K 4.1 3.7 3.4*  CL 105 103 106  CO2 23 24 22   GLUCOSE 137* 126* 140*  BUN 27* 17 14  CREATININE 0.66 0.86 0.91  CALCIUM 9.5 9.4 8.8*  GFRNONAA >60 >60 >60  GFRAA >60 >60 >60  PROT 7.4 7.1 6.9  ALBUMIN 4.2 3.9 3.7  AST 26 29 23   ALT 42 33 16  ALKPHOS 53 64 91  BILITOT 0.8 0.3 0.3   Iron/TIBC/Ferritin/ %Sat No results found for: IRON, TIBC, FERRITIN, IRONPCTSAT      ASSESSMENT & PLAN:  1. Endometrial cancer ([place])   2. Encounter for antineoplastic chemotherapy   3. Inflammatory heel pain, right   4. Fatigue, unspecified type   5. Neuropathy   Stage IA carcinosarcoma Labs reviewed. Acceptable  to proceed cycle 2 Carbo and Taxol. Dose reduction due to neuropathy/fatigue.   I had a discussion with patient that she is experiencing side effects from chemotherapy.  She has mentioned that she does not want to get any chemotherapy.  I discussed with her that dose reduction can be done  to see if she can tolerate further treatments.  However if this is her decision not to receive any additional chemotherapy I respect her wish. Patient agrees on having another try of chemotherapy with dose reduction.  We will close monitor her symptoms.  Reduce Carboplatin to AUC 4.5, and Taxol 150mg /m2  #Neuropathy right heel pain: During interval she complained about burning sensations of bilateral lower extremity which can be a sign of Taxol induced neuropathy. However I think there is also a component of anxiety  and inflammatory component with acute exacerbation of chronic fasciitis.. Gabapentin and Cymbalta were previously discussed with patient and patient decline Patient has tramadol 100 mg twice daily as needed and she requests to increase her tramadol dose for the pain and discomfort.  I increase her tramadol to 100 mg 3 times daily as needed.  narcotic side effects discussed with patient. I also recommend patient to use  ibuprofen 400 mg 3 times daily as needed for inflammatory pain.  # Drug-induced neutropenia, status post Neupogen x2.  Today she has leukocytosis which is consistent with recent Neupogen use. She is going to have chemotherapy today and have Udenyca prophylactically. Growth factor-Udenyca would be given as prophylaxis for chemotherapy-induced neutropenia to prevent febrile neutropenias. Discussed potential side effect- myalgias/arthralgias- recommend Claritin for 4 days.    patient has many questions about chemotherapy and I answered all the questions to them.   All questions were answered. The patient knows to call the clinic with any problems questions or concerns.  Return of visit: 3 weeks for assessment prior to next cycle of treatment. Total face to face encounter time for this patient visit was 40 min. >50% of the time was  spent in counseling and coordination of care.    Earlie Server, MD, PhD Hematology Oncology [facility] at [facility] Pager- [phone] 05/16/2018

## 2018-05-16 NOTE — Progress Notes (Signed)
Patient here for follow up. Requesting refill for Tramadol. She states she needs more than 30 count because 30 "doesn't last." Back and leg pain has started when she started taking the shots.

## 2018-05-17 ENCOUNTER — Inpatient Hospital Stay: Payer: BLUE CROSS/BLUE SHIELD

## 2018-05-18 ENCOUNTER — Inpatient Hospital Stay: Payer: BLUE CROSS/BLUE SHIELD

## 2018-05-18 DIAGNOSIS — Z5111 Encounter for antineoplastic chemotherapy: Secondary | ICD-10-CM | POA: Diagnosis not present

## 2018-05-18 DIAGNOSIS — C541 Malignant neoplasm of endometrium: Secondary | ICD-10-CM

## 2018-05-18 MED ORDER — PEGFILGRASTIM-CBQV 6 MG/0.6ML ~~LOC~~ SOSY
6.0000 mg | PREFILLED_SYRINGE | Freq: Once | SUBCUTANEOUS | Status: AC
  Administered 2018-05-18: 6 mg via SUBCUTANEOUS

## 2018-06-03 ENCOUNTER — Other Ambulatory Visit: Payer: Self-pay | Admitting: Oncology

## 2018-06-04 NOTE — Telephone Encounter (Signed)
   Ref Range & Units 2 wk ago  Potassium 3.5 - 5.1 mmol/L 3.4Low          

## 2018-06-06 ENCOUNTER — Other Ambulatory Visit: Payer: Self-pay

## 2018-06-06 ENCOUNTER — Inpatient Hospital Stay: Payer: BLUE CROSS/BLUE SHIELD

## 2018-06-06 ENCOUNTER — Inpatient Hospital Stay (HOSPITAL_BASED_OUTPATIENT_CLINIC_OR_DEPARTMENT_OTHER): Payer: BLUE CROSS/BLUE SHIELD | Admitting: Oncology

## 2018-06-06 ENCOUNTER — Encounter: Payer: Self-pay | Admitting: Oncology

## 2018-06-06 VITALS — BP 118/79 | HR 79 | Temp 96.3°F | Resp 18 | Wt 171.0 lb

## 2018-06-06 DIAGNOSIS — G629 Polyneuropathy, unspecified: Secondary | ICD-10-CM | POA: Diagnosis not present

## 2018-06-06 DIAGNOSIS — R5383 Other fatigue: Secondary | ICD-10-CM | POA: Diagnosis not present

## 2018-06-06 DIAGNOSIS — Z720 Tobacco use: Secondary | ICD-10-CM

## 2018-06-06 DIAGNOSIS — Z5111 Encounter for antineoplastic chemotherapy: Secondary | ICD-10-CM

## 2018-06-06 DIAGNOSIS — C541 Malignant neoplasm of endometrium: Secondary | ICD-10-CM | POA: Diagnosis not present

## 2018-06-06 DIAGNOSIS — R110 Nausea: Secondary | ICD-10-CM | POA: Diagnosis not present

## 2018-06-06 LAB — CBC WITH DIFFERENTIAL/PLATELET
Basophils Absolute: 0 10*3/uL (ref 0–0.1)
Basophils Relative: 1 %
Eosinophils Absolute: 0.1 10*3/uL (ref 0–0.7)
Eosinophils Relative: 2 %
HCT: 37.5 % (ref 35.0–47.0)
Hemoglobin: 12.8 g/dL (ref 12.0–16.0)
Lymphocytes Relative: 23 %
Lymphs Abs: 1.3 10*3/uL (ref 1.0–3.6)
MCH: 31.1 pg (ref 26.0–34.0)
MCHC: 34.2 g/dL (ref 32.0–36.0)
MCV: 90.9 fL (ref 80.0–100.0)
Monocytes Absolute: 0.7 10*3/uL (ref 0.2–0.9)
Monocytes Relative: 12 %
Neutro Abs: 3.6 10*3/uL (ref 1.4–6.5)
Neutrophils Relative %: 62 %
Platelets: 272 10*3/uL (ref 150–440)
RBC: 4.12 MIL/uL (ref 3.80–5.20)
RDW: 14.4 % (ref 11.5–14.5)
WBC: 5.7 10*3/uL (ref 3.6–11.0)

## 2018-06-06 LAB — COMPREHENSIVE METABOLIC PANEL
ALT: 24 U/L (ref 0–44)
AST: 28 U/L (ref 15–41)
Albumin: 4 g/dL (ref 3.5–5.0)
Alkaline Phosphatase: 66 U/L (ref 38–126)
Anion gap: 12 (ref 5–15)
BUN: 16 mg/dL (ref 8–23)
CO2: 22 mmol/L (ref 22–32)
Calcium: 9.1 mg/dL (ref 8.9–10.3)
Chloride: 107 mmol/L (ref 98–111)
Creatinine, Ser: 0.75 mg/dL (ref 0.44–1.00)
GFR calc Af Amer: >60 mL/min (ref >60)
GFR calc non Af Amer: >60 mL/min (ref >60)
Glucose, Bld: 131 mg/dL — ABNORMAL HIGH (ref 70–99)
Potassium: 3.6 mmol/L (ref 3.5–5.1)
Sodium: 141 mmol/L (ref 135–145)
Total Bilirubin: 0.5 mg/dL (ref 0.3–1.2)
Total Protein: 7.5 g/dL (ref 6.5–8.1)

## 2018-06-06 MED ORDER — HEPARIN SOD (PORK) LOCK FLUSH 100 UNIT/ML IV SOLN
500.0000 [IU] | Freq: Once | INTRAVENOUS | Status: AC
  Administered 2018-06-06: 500 [IU] via INTRAVENOUS
  Filled 2018-06-06: qty 5

## 2018-06-06 MED ORDER — PALONOSETRON HCL INJECTION 0.25 MG/5ML
0.2500 mg | Freq: Once | INTRAVENOUS | Status: AC
  Administered 2018-06-06: 0.25 mg via INTRAVENOUS
  Filled 2018-06-06: qty 5

## 2018-06-06 MED ORDER — POTASSIUM CHLORIDE ER 10 MEQ PO TBCR: 10.0000 meq | EXTENDED_RELEASE_TABLET | Freq: Every day | ORAL | 1 refills | Status: DC

## 2018-06-06 MED ORDER — SODIUM CHLORIDE 0.9 % IV SOLN
573.5000 mg | Freq: Once | INTRAVENOUS | Status: AC
  Administered 2018-06-06: 570 mg via INTRAVENOUS
  Filled 2018-06-06: qty 57

## 2018-06-06 MED ORDER — FAMOTIDINE IN NACL 20-0.9 MG/50ML-% IV SOLN
20.0000 mg | Freq: Once | INTRAVENOUS | Status: AC
  Administered 2018-06-06: 20 mg via INTRAVENOUS
  Filled 2018-06-06: qty 50

## 2018-06-06 MED ORDER — SODIUM CHLORIDE 0.9 % IV SOLN
20.0000 mg | Freq: Once | INTRAVENOUS | Status: AC
  Administered 2018-06-06: 20 mg via INTRAVENOUS
  Filled 2018-06-06: qty 2

## 2018-06-06 MED ORDER — SODIUM CHLORIDE 0.9 % IV SOLN
135.0000 mg/m2 | Freq: Once | INTRAVENOUS | Status: AC
  Administered 2018-06-06: 264 mg via INTRAVENOUS
  Filled 2018-06-06: qty 44

## 2018-06-06 MED ORDER — DIPHENHYDRAMINE HCL 50 MG/ML IJ SOLN
50.0000 mg | Freq: Once | INTRAMUSCULAR | Status: AC
  Administered 2018-06-06: 50 mg via INTRAVENOUS
  Filled 2018-06-06: qty 1

## 2018-06-06 MED ORDER — SODIUM CHLORIDE 0.9 % IV SOLN
Freq: Once | INTRAVENOUS | Status: AC
  Administered 2018-06-06: 09:00:00 via INTRAVENOUS
  Filled 2018-06-06: qty 1000

## 2018-06-06 MED ORDER — GABAPENTIN 300 MG PO CAPS: 300.0000 mg | ORAL_CAPSULE | Freq: Three times a day (TID) | ORAL | 1 refills | Status: DC

## 2018-06-06 NOTE — Progress Notes (Signed)
Patient here for follow up. Requesting refill for potassium if needed to continue. Patient c/o pain to legs during mid day and at bedtime and is requesting tramadol refill.

## 2018-06-06 NOTE — Progress Notes (Signed)
Hematology/Oncology Follow up  note [facility] [phone] [phone]   Patient Care Team: Chrismon, Vickki Muff, PA as PCP - General (Family Medicine) REASON FOR VISIT Follow up for treatment of uterus carcinosarcoma  HISTORY OF PRESENTING ILLNESS:  Katherine Snyder is a  67 y.o.  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 Pathology showed  A uterus with cervix hysterectomy -Carcinosarcoma B sentinel lymph node, right primary obturator negative C lymph node right external iliac lymph negative D fallopian tube and ovary, unilateral right negative E sentinel lymph node right low periaortic: Negative F sentinel lymph node left low periaortic negative  CANCER CASE SUMMARY: ENDOMETRIUM  Procedure: Total hysterectomy and bilateral salpingo-oophorectomy  Histologic Type: Carcinosarcoma  Histologic Grade: Not applicable  Myometrial Invasion: Present    Depth of myometrial invasion cannot be determined due to exophytic  tumor growth and effacement of endometrial/myometrial junction    Myometrial thickness: At least 8 mm    Percentage depth of myometrial invasion: Estimated less than 50%  Uterine Serosa Involvement: Not identified  Cervical Stromal Involvement: Not identified  Other Tissue/Organ Involvement: Not identified  Peritoneal/ Ascitic Fluid: Negative for malignancy  Lymphovascular Invasion: Present  Regional Lymph Nodes: All lymph nodes negative for tumor cells  Number of Lymph Nodes Examined: 5    Total number of pelvic nodes examined: 2    Number of pelvic sentinel nodes examined: 1    Total number of para-aortic nodes examined: 3    Number of para-aortic sentinel nodes examined: 3   Pathologic Stage Classification (pTNM, AJCC  8th Edition) (Note J): pT1a  pN0/FIGO IA  Today patient reports feeling well.  Denies any pain.  # Patient's case was discussed and GYN tumor board today.  Consensus decision was to proceed with adjuvant chemotherapy with carboplatin and Taxol for 6 cycles, followed by vaginal brachy therapy.   INTERVAL HISTORY Katherine Snyder is a 67 y.o. female who has above history reviewed by me today presents for assessment prior to chemotherapy management for stage IA endometrial carcinosarcoma. During cycle 1 treatment, reported feeling burning down and having really bad heartburn.  Taxol was temporarily stopped.  The patient was given Benadryl, Solu-Medrol, Pepcid, symptoms improved and resolved.  Taxol was restarted and patient is able to finish her treatment. Patient reports feeling extremely tired for 2 days after chemotherapy.  She was seen by nurse practitioner Sonia Baller during interval.   She complained burning sensation with urination and had UA and urine culture obtained.  UA was not convincing for UTI urine culture showed less than 10,000 colonies of insignificant growth.  Patient remains afebrile. Patient also reports her chronic fasciitis of foot has got a lot worse since the start of chemotherapy.  Possible neuropathy was discussed and the patient is reluctant to start gabapentin or Cymbalta.  She was given tramadol 100 mg twice daily.  Patient reports symptoms gradually improved.    Patient was found to have neutropenia so she received Neupogen x 2. She reports bone pain started after Neupogen shots.   Fatigue was severe for a few days after chemotherapy, gradually improved.  Denies any nausea vomiting.  Appetite is good.  Patient appears to be very anxious and emotional.  She says "I am not going to do any more chemotherapy if this is how it will be".  INTERVAL  HISTORY Katherine Snyder is a 67 y.o. female who has above history reviewed by me today presents for follow up visit for assessment prior to  chemotherapy.  She tolerated cycle 2 chemotherapy[dose reduced] well.  Problems and complaints are listed below: Fatigue: moderate fatigue after chemotherapy treatments.  Nausea without vomiting: mild manageable with anti-emetics.  Neuropathy: Bilateral feet burning/pain, improved with Tramadol PRN. Appetite fair. Weight loss: lost 3 pounds during the interval.     Review of Systems  Constitutional: Positive for malaise/fatigue. Negative for chills, fever and weight loss.  HENT: Negative for congestion, ear discharge, ear pain, nosebleeds, sinus pain and sore throat.   Eyes: Negative for double vision, photophobia, pain, discharge and redness.  Respiratory: Negative for cough, hemoptysis, sputum production, shortness of breath and wheezing.   Cardiovascular: Negative for chest pain, palpitations, orthopnea, claudication and leg swelling.  Gastrointestinal: Positive for nausea. Negative for abdominal pain, blood in stool, constipation, diarrhea, heartburn, melena and vomiting.  Genitourinary: Negative for dysuria, flank pain, frequency and hematuria.  Musculoskeletal: Negative for back pain, myalgias and neck pain.  Skin: Negative for itching and rash.  Neurological: Negative for dizziness, tremors, focal weakness, weakness and headaches.       Bilateral foot burning sensation. Pain has improved.   Endo/Heme/Allergies: Negative for environmental allergies. Does not bruise/bleed easily.  Psychiatric/Behavioral: Negative for depression and hallucinations. The patient is not nervous/anxious.     MEDICAL HISTORY:  Past Medical History:  Diagnosis Date  . Arthritis   . Cancer [facility])    endometrial  . Endometrial mass   . History of chicken pox   . History of kidney stones   . History of measles   . History of mumps   . Osteoporosis   . PMB (postmenopausal bleeding)   . Psoriasis     SURGICAL HISTORY: Past Surgical History:  Procedure Laterality Date  . APPENDECTOMY  1960  .  BREAST SURGERY Left    biopsy  . Injection varicose veins in legs Bilateral 2010   Dr. Hulda Humphrey  . LITHOTRIPSY  1994, 2004   for renal stones  . PORTA CATH INSERTION N/A 04/24/2018   Procedure: PORTA CATH INSERTION;  Surgeon: Katha Cabal, MD;  Location: [place] [facility];  Service: Cardiovascular;  Laterality: N/A;  . ROBOTIC ASSISTED TOTAL HYSTERECTOMY Bilateral 04/04/2018   Procedure: ROBOTIC ASSISTED TOTAL HYSTERECTOMY,SENTINEL LYMPH NODE MAPPING AND BIOPSIES,AORTIC LYMPH NODE DISSECTION;  Surgeon: Gillis Ends, MD;  Location: [facility];  Service: Gynecology;  Laterality: Bilateral;  . TUBAL LIGATION  1980    SOCIAL HISTORY: Social History   Socioeconomic History  . Marital status: Single    Spouse name: Not on file  . Number of children: 1  . Years of education: Not on file  . Highest education level: Not on file  Occupational History  . Occupation: Employed    Comment: Works at a [employer] doing [employer]  Social Needs  . Financial resource strain: Not on file  . Food insecurity:    Worry: Not on file    Inability: Not on file  . Transportation needs:    Medical: Not on file    Non-medical: Not on file  Tobacco Use  . Smoking status: Current Every Day Smoker    Packs/day: 0.25    Types: Cigarettes  . Smokeless tobacco: Never Used  Substance and Sexual Activity  . Alcohol use: No    Alcohol/week: 0.0 oz  . Drug use: No  . Sexual activity: Not  Currently  Lifestyle  . Physical activity:    Days per week: Not on file    Minutes per session: Not on file  . Stress: Not on file  Relationships  . Social connections:    Talks on phone: Not on file    Gets together: Not on file    Attends religious service: Not on file    Active member of club or organization: Not on file    Attends meetings of clubs or organizations: Not on file    Relationship status: Not on file  . Intimate partner violence:    Fear of current or ex partner: Not on file     Emotionally abused: Not on file    Physically abused: Not on file    Forced sexual activity: Not on file  Other Topics Concern  . Not on file  Social History Narrative  . Not on file    FAMILY HISTORY: Family History  Problem Relation Age of Onset  . Breast cancer Mother   . Transient ischemic attack Mother   . Arthritis Mother   . Hypothyroidism Mother   . Lung cancer Mother   . Diabetes Father        type 2  . Hypertension Father   . Arthritis Father   . Colon cancer Sister   . Prostate cancer Brother   . Hyperlipidemia Son   . Stomach cancer Maternal Grandmother   . Stroke Maternal Grandfather     ALLERGIES:  has No Known Allergies.  MEDICATIONS:  Current Outpatient Medications  Medication Sig Dispense Refill  . acetaminophen (TYLENOL) 325 MG tablet Take 650 mg by mouth every 6 (six) hours as needed.    Marland Kitchen antiseptic oral rinse (BIOTENE) LIQD 15 mLs by Mouth Rinse route as needed for dry mouth. 1 Bottle 1  . betamethasone dipropionate (DIPROLENE) 0.05 % cream Apply topically 2 (two) times daily. As directed for psoriasis on body. (Patient taking differently: Apply 1 application topically 2 (two) times daily as needed (for psoriasis on body). ) 30 g 1  . desonide (DESOWEN) 0.05 % cream Apply once a day sparingly to seborrhea as needed on face. (Patient taking differently: Apply 1 application topically 2 (two) times daily as needed (for psoriasis on face). ) 30 g 1  . dexamethasone (DECADRON) 4 MG tablet Take 2 tablets (8 mg total) by mouth daily. Start the day after chemotherapy for 2 days. 30 tablet 1  . lidocaine-prilocaine (EMLA) cream Apply to affected area once 30 g 3  . magic mouthwash w/lidocaine SOLN Take 5 mLs by mouth 4 (four) times daily. 240 mL 0  . Multiple Vitamins-Minerals (CENTRUM SILVER ADULT 50+ PO) Take 1 tablet by mouth daily.    . ondansetron (ZOFRAN) 8 MG tablet Take 1 tablet (8 mg total) by mouth 2 (two) times daily as needed for refractory nausea /  vomiting. 30 tablet 1  . prochlorperazine (COMPAZINE) 10 MG tablet Take 1 tablet (10 mg total) by mouth every 6 (six) hours as needed (Nausea or vomiting). 30 tablet 1  . ranitidine (ZANTAC) 150 MG tablet Take 150 mg by mouth daily.    Marland Kitchen triamcinolone (KENALOG) 0.025 % cream Apply 1 application topically 2 (two) times daily. 30 g 0  . oxyCODONE (ROXICODONE) 5 MG immediate release tablet Take 1 tablet (5 mg total) by mouth every 4 (four) hours as needed. (Patient not taking: Reported on 04/24/2018) 16 tablet 0  . potassium chloride (K-DUR) 10 MEQ tablet Take 1 tablet (  10 mEq total) by mouth daily. (Patient not taking: Reported on 06/06/2018) 21 tablet 0  . traMADol (ULTRAM) 50 MG tablet Take 2 tablets (100 mg total) by mouth 3 (three) times daily as needed for moderate pain. (Patient not taking: Reported on 06/06/2018) 60 tablet 0   No current facility-administered medications for this visit.      PHYSICAL EXAMINATION: ECOG PERFORMANCE STATUS: 0 - Asymptomatic Vitals:   06/06/18 0832  BP: 118/79  Pulse: 79  Resp: 18  Temp: (!) 96.3 F (35.7 C)   Filed Weights   06/06/18 0832  Weight: 171 lb (77.6 kg)    Physical Exam  Constitutional: She is oriented to person, place, and time. She appears well-developed and well-nourished. No distress.  HENT:  Head: Normocephalic and atraumatic.  Right Ear: External ear normal.  Left Ear: External ear normal.  Mouth/Throat: Oropharynx is clear and moist.  Eyes: Pupils are equal, round, and reactive to light. Conjunctivae and EOM are normal. No scleral icterus.  Neck: Normal range of motion. Neck supple.  Cardiovascular: Normal rate, regular rhythm and normal heart sounds.  Pulmonary/Chest: Effort normal and breath sounds normal. No respiratory distress. She has no wheezes. She has no rales. She exhibits no tenderness.  Abdominal: Soft. Bowel sounds are normal. She exhibits no distension and no mass. There is no tenderness.  Musculoskeletal: Normal  range of motion. She exhibits no edema or deformity.  Lymphadenopathy:    She has no cervical adenopathy.  Neurological: She is alert and oriented to person, place, and time. She displays normal reflexes. No cranial nerve deficit. Coordination normal.  Skin: Skin is warm and dry. No rash noted.  Psychiatric: She has a normal mood and affect. Her behavior is normal. Thought content normal.     LABORATORY DATA:  I have reviewed the data as listed Lab Results  Component Value Date   WBC 5.7 06/06/2018   HGB 12.8 06/06/2018   HCT 37.5 06/06/2018   MCV 90.9 06/06/2018   PLT 272 06/06/2018   Recent Labs    05/09/18 1348 05/16/18 0752 06/06/18 0746  NA 136 139 141  K 3.7 3.4* 3.6  CL 103 106 107  CO2 24 22 22   GLUCOSE 126* 140* 131*  BUN 17 14 16   CREATININE 0.86 0.91 0.75  CALCIUM 9.4 8.8* 9.1  GFRNONAA >60 >60 >60  GFRAA >60 >60 >60  PROT 7.1 6.9 7.5  ALBUMIN 3.9 3.7 4.0  AST 29 23 28   ALT 33 16 24  ALKPHOS 64 91 66  BILITOT 0.3 0.3 0.5   Iron/TIBC/Ferritin/ %Sat No results found for: IRON, TIBC, FERRITIN, IRONPCTSAT      ASSESSMENT & PLAN:  1. Endometrial cancer ([place])   2. Encounter for antineoplastic chemotherapy   3. Fatigue, unspecified type   4. Neuropathy   Stage IA carcinosarcoma # labs reviewed and counts acceptable to proceed cycle 3 Carbo and Taxol.  Carbo [ AUC 5] and Taxol [ dose reduced to 135mg /m2].   # Neuropathy of bilateral feet: improved with Tramadol.  I recommend also starting Gabapentin 300mg  TID for neuropathy. Advise patient to start with 300mg  once daily, if tolerates well, increased to twice a day and further titrate up to 3 times daily if tolerating.   Growth factor-Udenyca would be given as prophylaxis for chemotherapy-induced neutropenia to prevent febrile neutropenias. Discussed potential side effect- myalgias/arthralgias- recommend Claritin for 4 days.   All questions were answered. The patient knows to call the clinic with any  problems  questions or concerns.  Return of visit: 3 weeks.  Total face to face encounter time for this patient visit was 25 min. >50% of the time was  spent in counseling and coordination of care.   Earlie Server, MD, PhD Hematology Oncology [facility] Pager- [phone] 06/06/2018

## 2018-06-08 ENCOUNTER — Inpatient Hospital Stay: Payer: BLUE CROSS/BLUE SHIELD | Attending: Oncology

## 2018-06-08 DIAGNOSIS — R918 Other nonspecific abnormal finding of lung field: Secondary | ICD-10-CM | POA: Insufficient documentation

## 2018-06-08 DIAGNOSIS — C541 Malignant neoplasm of endometrium: Secondary | ICD-10-CM | POA: Diagnosis present

## 2018-06-08 DIAGNOSIS — Z5189 Encounter for other specified aftercare: Secondary | ICD-10-CM | POA: Diagnosis not present

## 2018-06-08 DIAGNOSIS — E876 Hypokalemia: Secondary | ICD-10-CM | POA: Diagnosis not present

## 2018-06-08 DIAGNOSIS — Z720 Tobacco use: Secondary | ICD-10-CM | POA: Diagnosis not present

## 2018-06-08 DIAGNOSIS — Z5111 Encounter for antineoplastic chemotherapy: Secondary | ICD-10-CM | POA: Insufficient documentation

## 2018-06-08 DIAGNOSIS — R5383 Other fatigue: Secondary | ICD-10-CM | POA: Diagnosis not present

## 2018-06-08 DIAGNOSIS — G629 Polyneuropathy, unspecified: Secondary | ICD-10-CM | POA: Diagnosis not present

## 2018-06-08 DIAGNOSIS — M7989 Other specified soft tissue disorders: Secondary | ICD-10-CM | POA: Diagnosis not present

## 2018-06-08 MED ORDER — PEGFILGRASTIM-CBQV 6 MG/0.6ML ~~LOC~~ SOSY
6.0000 mg | PREFILLED_SYRINGE | Freq: Once | SUBCUTANEOUS | Status: AC
  Administered 2018-06-08: 6 mg via SUBCUTANEOUS

## 2018-06-22 ENCOUNTER — Telehealth: Payer: Self-pay

## 2018-06-25 NOTE — Telephone Encounter (Signed)
error 

## 2018-06-27 ENCOUNTER — Encounter: Payer: Self-pay | Admitting: Oncology

## 2018-06-27 ENCOUNTER — Other Ambulatory Visit: Payer: Self-pay

## 2018-06-27 ENCOUNTER — Inpatient Hospital Stay: Payer: BLUE CROSS/BLUE SHIELD

## 2018-06-27 ENCOUNTER — Inpatient Hospital Stay (HOSPITAL_BASED_OUTPATIENT_CLINIC_OR_DEPARTMENT_OTHER): Payer: BLUE CROSS/BLUE SHIELD | Admitting: Oncology

## 2018-06-27 VITALS — BP 114/74 | HR 68 | Temp 96.7°F | Wt 176.5 lb

## 2018-06-27 DIAGNOSIS — R918 Other nonspecific abnormal finding of lung field: Secondary | ICD-10-CM

## 2018-06-27 DIAGNOSIS — R5383 Other fatigue: Secondary | ICD-10-CM

## 2018-06-27 DIAGNOSIS — E876 Hypokalemia: Secondary | ICD-10-CM

## 2018-06-27 DIAGNOSIS — Z720 Tobacco use: Secondary | ICD-10-CM

## 2018-06-27 DIAGNOSIS — C541 Malignant neoplasm of endometrium: Secondary | ICD-10-CM

## 2018-06-27 DIAGNOSIS — Z5111 Encounter for antineoplastic chemotherapy: Secondary | ICD-10-CM | POA: Diagnosis not present

## 2018-06-27 DIAGNOSIS — M7989 Other specified soft tissue disorders: Secondary | ICD-10-CM

## 2018-06-27 DIAGNOSIS — G629 Polyneuropathy, unspecified: Secondary | ICD-10-CM | POA: Diagnosis not present

## 2018-06-27 LAB — COMPREHENSIVE METABOLIC PANEL
ALT: 32 U/L (ref 0–44)
AST: 29 U/L (ref 15–41)
Albumin: 3.7 g/dL (ref 3.5–5.0)
Alkaline Phosphatase: 69 U/L (ref 38–126)
Anion gap: 7 (ref 5–15)
BUN: 15 mg/dL (ref 8–23)
CO2: 25 mmol/L (ref 22–32)
Calcium: 8.8 mg/dL — ABNORMAL LOW (ref 8.9–10.3)
Chloride: 106 mmol/L (ref 98–111)
Creatinine, Ser: 0.7 mg/dL (ref 0.44–1.00)
GFR calc Af Amer: >60 mL/min (ref >60)
GFR calc non Af Amer: >60 mL/min (ref >60)
Glucose, Bld: 128 mg/dL — ABNORMAL HIGH (ref 70–99)
Potassium: 3.4 mmol/L — ABNORMAL LOW (ref 3.5–5.1)
Sodium: 138 mmol/L (ref 135–145)
Total Bilirubin: 0.6 mg/dL (ref 0.3–1.2)
Total Protein: 6.7 g/dL (ref 6.5–8.1)

## 2018-06-27 LAB — CBC WITH DIFFERENTIAL/PLATELET
Basophils Absolute: 0 10*3/uL (ref 0–0.1)
Basophils Relative: 1 %
Eosinophils Absolute: 0.1 10*3/uL (ref 0–0.7)
Eosinophils Relative: 1 %
HCT: 34.5 % — ABNORMAL LOW (ref 35.0–47.0)
Hemoglobin: 11.8 g/dL — ABNORMAL LOW (ref 12.0–16.0)
Lymphocytes Relative: 19 %
Lymphs Abs: 1.3 10*3/uL (ref 1.0–3.6)
MCH: 32 pg (ref 26.0–34.0)
MCHC: 34.1 g/dL (ref 32.0–36.0)
MCV: 93.6 fL (ref 80.0–100.0)
Monocytes Absolute: 0.7 10*3/uL (ref 0.2–0.9)
Monocytes Relative: 10 %
Neutro Abs: 4.8 10*3/uL (ref 1.4–6.5)
Neutrophils Relative %: 69 %
Platelets: 217 10*3/uL (ref 150–440)
RBC: 3.68 MIL/uL — ABNORMAL LOW (ref 3.80–5.20)
RDW: 17.5 % — ABNORMAL HIGH (ref 11.5–14.5)
WBC: 6.9 10*3/uL (ref 3.6–11.0)

## 2018-06-27 MED ORDER — SODIUM CHLORIDE 0.9 % IV SOLN
135.0000 mg/m2 | Freq: Once | INTRAVENOUS | Status: AC
  Administered 2018-06-27: 264 mg via INTRAVENOUS
  Filled 2018-06-27: qty 44

## 2018-06-27 MED ORDER — HEPARIN SOD (PORK) LOCK FLUSH 100 UNIT/ML IV SOLN
500.0000 [IU] | Freq: Once | INTRAVENOUS | Status: AC
  Administered 2018-06-27: 500 [IU] via INTRAVENOUS

## 2018-06-27 MED ORDER — DIPHENHYDRAMINE HCL 50 MG/ML IJ SOLN
50.0000 mg | Freq: Once | INTRAMUSCULAR | Status: AC
  Administered 2018-06-27: 50 mg via INTRAVENOUS
  Filled 2018-06-27: qty 1

## 2018-06-27 MED ORDER — SODIUM CHLORIDE 0.9 % IV SOLN
Freq: Once | INTRAVENOUS | Status: AC
  Administered 2018-06-27: 10:00:00 via INTRAVENOUS
  Filled 2018-06-27: qty 250

## 2018-06-27 MED ORDER — PALONOSETRON HCL INJECTION 0.25 MG/5ML
0.2500 mg | Freq: Once | INTRAVENOUS | Status: AC
  Administered 2018-06-27: 0.25 mg via INTRAVENOUS
  Filled 2018-06-27: qty 5

## 2018-06-27 MED ORDER — FAMOTIDINE IN NACL 20-0.9 MG/50ML-% IV SOLN
20.0000 mg | Freq: Once | INTRAVENOUS | Status: AC
  Administered 2018-06-27: 20 mg via INTRAVENOUS
  Filled 2018-06-27: qty 50

## 2018-06-27 MED ORDER — SODIUM CHLORIDE 0.9 % IV SOLN
20.0000 mg | Freq: Once | INTRAVENOUS | Status: AC
  Administered 2018-06-27: 20 mg via INTRAVENOUS
  Filled 2018-06-27: qty 2

## 2018-06-27 MED ORDER — SODIUM CHLORIDE 0.9 % IV SOLN
573.5000 mg | Freq: Once | INTRAVENOUS | Status: AC
  Administered 2018-06-27: 570 mg via INTRAVENOUS
  Filled 2018-06-27: qty 57

## 2018-06-27 NOTE — Progress Notes (Signed)
Hematology/Oncology Follow up  note [facility] [phone] [phone]   Patient Care Team: Chrismon, Vickki Muff, PA as PCP - General (Family Medicine) REASON FOR VISIT Follow up for treatment of uterus carcinosarcoma  HISTORY OF PRESENTING ILLNESS:  Katherine Snyder is a  67 y.o.  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 Pathology showed  A uterus with cervix hysterectomy -Carcinosarcoma B sentinel lymph node, right primary obturator negative C lymph node right external iliac lymph negative D fallopian tube and ovary, unilateral right negative E sentinel lymph node right low periaortic: Negative F sentinel lymph node left low periaortic negative  CANCER CASE SUMMARY: ENDOMETRIUM  Procedure: Total hysterectomy and bilateral salpingo-oophorectomy  Histologic Type: Carcinosarcoma  Histologic Grade: Not applicable  Myometrial Invasion: Present    Depth of myometrial invasion cannot be determined due to exophytic  tumor growth and effacement of endometrial/myometrial junction    Myometrial thickness: At least 8 mm    Percentage depth of myometrial invasion: Estimated less than 50%  Uterine Serosa Involvement: Not identified  Cervical Stromal Involvement: Not identified  Other Tissue/Organ Involvement: Not identified  Peritoneal/ Ascitic Fluid: Negative for malignancy  Lymphovascular Invasion: Present  Regional Lymph Nodes: All lymph nodes negative for tumor cells  Number of Lymph Nodes Examined: 5    Total number of pelvic nodes examined: 2    Number of pelvic sentinel nodes examined: 1    Total number of para-aortic nodes examined: 3    Number of para-aortic sentinel nodes examined: 3   Pathologic Stage Classification (pTNM, AJCC  8th Edition) (Note J): pT1a  pN0/FIGO IA  Today patient reports feeling well.  Denies any pain.  # Patient's case was discussed and GYN tumor board today.  Consensus decision was to proceed with adjuvant chemotherapy with carboplatin and Taxol for 6 cycles, followed by vaginal brachy therapy.   INTERVAL HISTORY Katherine Snyder is a 67 y.o. female who has above history reviewed by me today presents for assessment prior to chemotherapy management for stage IA endometrial carcinosarcoma. During cycle 1 treatment, reported feeling burning down and having really bad heartburn.  Taxol was temporarily stopped.  The patient was given Benadryl, Solu-Medrol, Pepcid, symptoms improved and resolved.  Taxol was restarted and patient is able to finish her treatment. Patient reports feeling extremely tired for 2 days after chemotherapy.  She was seen by nurse practitioner Sonia Baller during interval.   She complained burning sensation with urination and had UA and urine culture obtained.  UA was not convincing for UTI urine culture showed less than 10,000 colonies of insignificant growth.  Patient remains afebrile. Patient also reports her chronic fasciitis of foot has got a lot worse since the start of chemotherapy.  Possible neuropathy was discussed and the patient is reluctant to start gabapentin or Cymbalta.  She was given tramadol 100 mg twice daily.  Patient reports symptoms gradually improved.    Patient was found to have neutropenia so she received Neupogen x 2. She reports bone pain started after Neupogen shots.   Fatigue was severe for a few days after chemotherapy, gradually improved.  Denies any nausea vomiting.  Appetite is good.  Patient appears to be very anxious and emotional.  She says "I am not going to do any more chemotherapy if this is how it will be".  INTERVAL  HISTORY Katherine Snyder is a 67 y.o. female who has above history reviewed by me presents for assessment prior to cycle 4 adjuvant  chemotherapy. Problems and complaints are listed below: #Chemotherapy, patient reports tolerating cycle 3 chemotherapy well.  She felt mild nausea and antinausea medication she has relief her symptoms. Appetite has been very well.  She has gained weight.  #Fatigue, moderate fatigue after chemotherapy treatment.  Stable. #Neuropathy, bilateral feet burning and pain improved with gabapentin 300 3 times daily.  #Bilateral lower extremity swelling, worse at the end of the day.  Improved after leg elevation.  Review of Systems  Constitutional: Positive for malaise/fatigue. Negative for chills, fever and weight loss.  HENT: Negative for congestion, ear discharge, ear pain, nosebleeds, sinus pain and sore throat.   Eyes: Negative for double vision, photophobia, pain, discharge and redness.  Respiratory: Negative for cough, hemoptysis, sputum production, shortness of breath and wheezing.   Cardiovascular: Negative for chest pain, palpitations, orthopnea, claudication and leg swelling.  Gastrointestinal: Negative for abdominal pain, blood in stool, constipation, diarrhea, heartburn, melena, nausea and vomiting.  Genitourinary: Negative for dysuria, flank pain, frequency and hematuria.  Musculoskeletal: Negative for back pain, myalgias and neck pain.  Skin: Negative for itching and rash.  Neurological: Negative for dizziness, tingling, tremors, focal weakness, weakness and headaches.       Bilateral foot burning sensation/ Pain improved.   Endo/Heme/Allergies: Negative for environmental allergies. Does not bruise/bleed easily.  Psychiatric/Behavioral: Negative for depression and hallucinations. The patient is not nervous/anxious.     MEDICAL HISTORY:  Past Medical History:  Diagnosis Date  . Arthritis   . Cancer [facility] [place])    endometrial  . Endometrial mass   . History of chicken pox   . History of kidney stones   . History of measles   . History of mumps   . Osteoporosis   . PMB  (postmenopausal bleeding)   . Psoriasis     SURGICAL HISTORY: Past Surgical History:  Procedure Laterality Date  . APPENDECTOMY  1960  . BREAST SURGERY Left    biopsy  . Injection varicose veins in legs Bilateral 2010   Dr. Hulda Humphrey  . LITHOTRIPSY  1994, 2004   for renal stones  . PORTA CATH INSERTION N/A 04/24/2018   Procedure: PORTA CATH INSERTION;  Surgeon: Katha Cabal, MD;  Location: [facility];  Service: Cardiovascular;  Laterality: N/A;  . ROBOTIC ASSISTED TOTAL HYSTERECTOMY Bilateral 04/04/2018   Procedure: ROBOTIC ASSISTED TOTAL HYSTERECTOMY,SENTINEL LYMPH NODE MAPPING AND BIOPSIES,AORTIC LYMPH NODE DISSECTION;  Surgeon: Gillis Ends, MD;  Location: [facility];  Service: Gynecology;  Laterality: Bilateral;  . TUBAL LIGATION  1980    SOCIAL HISTORY: Social History   Socioeconomic History  . Marital status: Single    Spouse name: Not on file  . Number of children: 1  . Years of education: Not on file  . Highest education level: Not on file  Occupational History  . Occupation: Employed    Comment: Works at a [employer] doing [employer]  Social Needs  . Financial resource strain: Not on file  . Food insecurity:    Worry: Not on file    Inability: Not on file  . Transportation needs:    Medical: Not on file    Non-medical: Not on file  Tobacco Use  . Smoking status: Current Every Day Smoker    Packs/day: 0.25    Types: Cigarettes  . Smokeless tobacco: Never Used  Substance and Sexual Activity  .  Alcohol use: No    Alcohol/week: 0.0 standard drinks  . Drug use: No  . Sexual activity: Not Currently  Lifestyle  . Physical activity:    Days per week: Not on file    Minutes per session: Not on file  . Stress: Not on file  Relationships  . Social connections:    Talks on phone: Not on file    Gets together: Not on file    Attends religious service: Not on file    Active member of club or organization: Not on file    Attends meetings of clubs  or organizations: Not on file    Relationship status: Not on file  . Intimate partner violence:    Fear of current or ex partner: Not on file    Emotionally abused: Not on file    Physically abused: Not on file    Forced sexual activity: Not on file  Other Topics Concern  . Not on file  Social History Narrative  . Not on file    FAMILY HISTORY: Family History  Problem Relation Age of Onset  . Breast cancer Mother   . Transient ischemic attack Mother   . Arthritis Mother   . Hypothyroidism Mother   . Lung cancer Mother   . Diabetes Father        type 2  . Hypertension Father   . Arthritis Father   . Colon cancer Sister   . Prostate cancer Brother   . Hyperlipidemia Son   . Stomach cancer Maternal Grandmother   . Stroke Maternal Grandfather     ALLERGIES:  has No Known Allergies.  MEDICATIONS:  Current Outpatient Medications  Medication Sig Dispense Refill  . acetaminophen (TYLENOL) 325 MG tablet Take 650 mg by mouth every 6 (six) hours as needed.    Marland Kitchen antiseptic oral rinse (BIOTENE) LIQD 15 mLs by Mouth Rinse route as needed for dry mouth. 1 Bottle 1  . betamethasone dipropionate (DIPROLENE) 0.05 % cream Apply topically 2 (two) times daily. As directed for psoriasis on body. (Patient taking differently: Apply 1 application topically 2 (two) times daily as needed (for psoriasis on body). ) 30 g 1  . desonide (DESOWEN) 0.05 % cream Apply once a day sparingly to seborrhea as needed on face. (Patient taking differently: Apply 1 application topically 2 (two) times daily as needed (for psoriasis on face). ) 30 g 1  . dexamethasone (DECADRON) 4 MG tablet Take 2 tablets (8 mg total) by mouth daily. Start the day after chemotherapy for 2 days. 30 tablet 1  . gabapentin (NEURONTIN) 300 MG capsule Take 1 capsule (300 mg total) by mouth 3 (three) times daily. 90 capsule 1  . lidocaine-prilocaine (EMLA) cream Apply to affected area once 30 g 3  . magic mouthwash w/lidocaine SOLN Take 5  mLs by mouth 4 (four) times daily. 240 mL 0  . Multiple Vitamins-Minerals (CENTRUM SILVER ADULT 50+ PO) Take 1 tablet by mouth daily.    . ondansetron (ZOFRAN) 8 MG tablet Take 1 tablet (8 mg total) by mouth 2 (two) times daily as needed for refractory nausea / vomiting. 30 tablet 1  . potassium chloride (K-DUR) 10 MEQ tablet Take 1 tablet (10 mEq total) by mouth daily. 30 tablet 1  . prochlorperazine (COMPAZINE) 10 MG tablet Take 1 tablet (10 mg total) by mouth every 6 (six) hours as needed (Nausea or vomiting). 30 tablet 1  . ranitidine (ZANTAC) 150 MG tablet Take 150 mg by mouth daily.    Marland Kitchen  triamcinolone (KENALOG) 0.025 % cream Apply 1 application topically 2 (two) times daily. 30 g 0  . oxyCODONE (ROXICODONE) 5 MG immediate release tablet Take 1 tablet (5 mg total) by mouth every 4 (four) hours as needed. (Patient not taking: Reported on 04/24/2018) 16 tablet 0  . traMADol (ULTRAM) 50 MG tablet Take 2 tablets (100 mg total) by mouth 3 (three) times daily as needed for moderate pain. (Patient not taking: Reported on 06/06/2018) 60 tablet 0   No current facility-administered medications for this visit.      PHYSICAL EXAMINATION: ECOG PERFORMANCE STATUS: 0 - Asymptomatic Vitals:   06/27/18 0832  BP: 114/74  Pulse: 68  Temp: (!) 96.7 F (35.9 C)   Filed Weights   06/27/18 0832  Weight: 176 lb 8 oz (80.1 kg)    Physical Exam  Constitutional: She is oriented to person, place, and time. She appears well-developed and well-nourished. No distress.  HENT:  Head: Normocephalic and atraumatic.  Right Ear: External ear normal.  Left Ear: External ear normal.  Mouth/Throat: Oropharynx is clear and moist.  Eyes: Pupils are equal, round, and reactive to light. Conjunctivae and EOM are normal. No scleral icterus.  Neck: Normal range of motion. Neck supple.  Cardiovascular: Normal rate, regular rhythm and normal heart sounds.  Pulmonary/Chest: Effort normal and breath sounds normal. No  respiratory distress. She has no wheezes. She has no rales. She exhibits no tenderness.  Abdominal: Soft. Bowel sounds are normal. She exhibits no distension and no mass. There is no tenderness.  Musculoskeletal: Normal range of motion. She exhibits edema. She exhibits no deformity.  Bilateral 1+ pitting edema  Lymphadenopathy:    She has no cervical adenopathy.  Neurological: She is alert and oriented to person, place, and time. She displays normal reflexes. No cranial nerve deficit. Coordination normal.  Skin: Skin is warm and dry. No rash noted.  Psychiatric: She has a normal mood and affect. Her behavior is normal. Thought content normal.     LABORATORY DATA:  I have reviewed the data as listed Lab Results  Component Value Date   WBC 6.9 06/27/2018   HGB 11.8 (L) 06/27/2018   HCT 34.5 (L) 06/27/2018   MCV 93.6 06/27/2018   PLT 217 06/27/2018   Recent Labs    05/16/18 0752 06/06/18 0746 06/27/18 0758  NA 139 141 138  K 3.4* 3.6 3.4*  CL 106 107 106  CO2 22 22 25   GLUCOSE 140* 131* 128*  BUN 14 16 15   CREATININE 0.91 0.75 0.70  CALCIUM 8.8* 9.1 8.8*  GFRNONAA >60 >60 >60  GFRAA >60 >60 >60  PROT 6.9 7.5 6.7  ALBUMIN 3.7 4.0 3.7  AST 23 28 29   ALT 16 24 32  ALKPHOS 91 66 69  BILITOT 0.3 0.5 0.6   Iron/TIBC/Ferritin/ %Sat No results found for: IRON, TIBC, FERRITIN, IRONPCTSAT      ASSESSMENT & PLAN:  1. Endometrial cancer ([place])   2. Encounter for antineoplastic chemotherapy   3. Fatigue, unspecified type   4. Neuropathy   5. Leg swelling   6. Hypokalemia   7. Multiple lung nodules on CT   Stage IA carcinosarcoma #Labs reviewed and discussed with patient.  Counts acceptable to proceed with cycle 4 chemotherapy. Carbo [ AUC 5] and Taxol [ dose reduced to 135mg /m2].  She will receive Udenyca on day 3 for bone marrow support. # Neuropathy of bilateral feet: Controlled with tramadol as needed and gabapentin 300 mg 3 times daily. #Hypokalemia, advised  patient  to take potassium chloride 10 mEq daily. #Bilateral lower extremity swelling, no calf pain.  Advised patient to consume less salt, leg elevation.  She voices understanding.  She will inform me if swelling gets worse or start to have lower extremity pain  #lung Nodules, repeat CT chest without contrast. All questions were answered. The patient knows to call the clinic with any problems questions or concerns.  Return of visit: 3 weeks  Total face to face encounter time for this patient visit was 25 min. >50% of the time was  spent in counseling and coordination of care.    Earlie Server, MD, PhD Hematology Oncology [facility] Pager- [phone] 06/27/2018

## 2018-06-27 NOTE — Progress Notes (Signed)
Patient here for follow up

## 2018-06-28 ENCOUNTER — Other Ambulatory Visit: Payer: Self-pay | Admitting: Oncology

## 2018-06-29 ENCOUNTER — Inpatient Hospital Stay: Payer: BLUE CROSS/BLUE SHIELD

## 2018-06-29 DIAGNOSIS — C541 Malignant neoplasm of endometrium: Secondary | ICD-10-CM

## 2018-06-29 DIAGNOSIS — Z5111 Encounter for antineoplastic chemotherapy: Secondary | ICD-10-CM | POA: Diagnosis not present

## 2018-06-29 MED ORDER — PEGFILGRASTIM-CBQV 6 MG/0.6ML ~~LOC~~ SOSY
6.0000 mg | PREFILLED_SYRINGE | Freq: Once | SUBCUTANEOUS | Status: AC
  Administered 2018-06-29: 6 mg via SUBCUTANEOUS

## 2018-07-11 ENCOUNTER — Ambulatory Visit: Payer: BLUE CROSS/BLUE SHIELD | Admitting: Radiation Oncology

## 2018-07-11 ENCOUNTER — Ambulatory Visit
Admission: RE | Admit: 2018-07-11 | Discharge: 2018-07-11 | Disposition: A | Discharge status: Home / Self Care | Payer: BLUE CROSS/BLUE SHIELD | Source: Ambulatory Visit | Attending: Obstetrics and Gynecology | Admitting: Obstetrics and Gynecology

## 2018-07-11 DIAGNOSIS — C541 Malignant neoplasm of endometrium: Secondary | ICD-10-CM | POA: Insufficient documentation

## 2018-07-11 DIAGNOSIS — I700 Atherosclerosis of aorta: Secondary | ICD-10-CM | POA: Insufficient documentation

## 2018-07-11 DIAGNOSIS — R918 Other nonspecific abnormal finding of lung field: Secondary | ICD-10-CM

## 2018-07-18 ENCOUNTER — Encounter: Payer: Self-pay | Admitting: Oncology

## 2018-07-18 ENCOUNTER — Inpatient Hospital Stay (HOSPITAL_BASED_OUTPATIENT_CLINIC_OR_DEPARTMENT_OTHER): Payer: BLUE CROSS/BLUE SHIELD | Admitting: Oncology

## 2018-07-18 ENCOUNTER — Inpatient Hospital Stay: Payer: BLUE CROSS/BLUE SHIELD | Attending: Oncology

## 2018-07-18 ENCOUNTER — Other Ambulatory Visit: Payer: Self-pay

## 2018-07-18 ENCOUNTER — Inpatient Hospital Stay: Payer: BLUE CROSS/BLUE SHIELD

## 2018-07-18 VITALS — BP 108/71 | HR 69 | Temp 98.4°F | Resp 18 | Wt 178.1 lb

## 2018-07-18 DIAGNOSIS — M7989 Other specified soft tissue disorders: Secondary | ICD-10-CM

## 2018-07-18 DIAGNOSIS — R918 Other nonspecific abnormal finding of lung field: Secondary | ICD-10-CM | POA: Insufficient documentation

## 2018-07-18 DIAGNOSIS — H43391 Other vitreous opacities, right eye: Secondary | ICD-10-CM

## 2018-07-18 DIAGNOSIS — G629 Polyneuropathy, unspecified: Secondary | ICD-10-CM | POA: Diagnosis not present

## 2018-07-18 DIAGNOSIS — Z720 Tobacco use: Secondary | ICD-10-CM

## 2018-07-18 DIAGNOSIS — Z5189 Encounter for other specified aftercare: Secondary | ICD-10-CM | POA: Insufficient documentation

## 2018-07-18 DIAGNOSIS — Z5111 Encounter for antineoplastic chemotherapy: Secondary | ICD-10-CM | POA: Insufficient documentation

## 2018-07-18 DIAGNOSIS — C541 Malignant neoplasm of endometrium: Secondary | ICD-10-CM

## 2018-07-18 DIAGNOSIS — R5383 Other fatigue: Secondary | ICD-10-CM | POA: Diagnosis not present

## 2018-07-18 LAB — CBC WITH DIFFERENTIAL/PLATELET
Basophils Absolute: 0 10*3/uL (ref 0–0.1)
Basophils Relative: 1 %
Eosinophils Absolute: 0.1 10*3/uL (ref 0–0.7)
Eosinophils Relative: 1 %
HCT: 32.2 % — ABNORMAL LOW (ref 35.0–47.0)
Hemoglobin: 11.1 g/dL — ABNORMAL LOW (ref 12.0–16.0)
Lymphocytes Relative: 19 %
Lymphs Abs: 1.1 10*3/uL (ref 1.0–3.6)
MCH: 33 pg (ref 26.0–34.0)
MCHC: 34.3 g/dL (ref 32.0–36.0)
MCV: 96.2 fL (ref 80.0–100.0)
Monocytes Absolute: 0.6 10*3/uL (ref 0.2–0.9)
Monocytes Relative: 11 %
Neutro Abs: 4 10*3/uL (ref 1.4–6.5)
Neutrophils Relative %: 68 %
Platelets: 180 10*3/uL (ref 150–440)
RBC: 3.35 MIL/uL — ABNORMAL LOW (ref 3.80–5.20)
RDW: 17.9 % — ABNORMAL HIGH (ref 11.5–14.5)
WBC: 5.9 10*3/uL (ref 3.6–11.0)

## 2018-07-18 LAB — COMPREHENSIVE METABOLIC PANEL
ALT: 18 U/L (ref 0–44)
AST: 22 U/L (ref 15–41)
Albumin: 3.7 g/dL (ref 3.5–5.0)
Alkaline Phosphatase: 66 U/L (ref 38–126)
Anion gap: 7 (ref 5–15)
BUN: 15 mg/dL (ref 8–23)
CO2: 25 mmol/L (ref 22–32)
Calcium: 8.9 mg/dL (ref 8.9–10.3)
Chloride: 109 mmol/L (ref 98–111)
Creatinine, Ser: 0.66 mg/dL (ref 0.44–1.00)
GFR calc Af Amer: >60 mL/min (ref >60)
GFR calc non Af Amer: >60 mL/min (ref >60)
Glucose, Bld: 126 mg/dL — ABNORMAL HIGH (ref 70–99)
Potassium: 3.5 mmol/L (ref 3.5–5.1)
Sodium: 141 mmol/L (ref 135–145)
Total Bilirubin: 0.6 mg/dL (ref 0.3–1.2)
Total Protein: 6.5 g/dL (ref 6.5–8.1)

## 2018-07-18 MED ORDER — DIPHENHYDRAMINE HCL 50 MG/ML IJ SOLN
50.0000 mg | Freq: Once | INTRAMUSCULAR | Status: AC
  Administered 2018-07-18: 50 mg via INTRAVENOUS
  Filled 2018-07-18: qty 1

## 2018-07-18 MED ORDER — HEPARIN SOD (PORK) LOCK FLUSH 100 UNIT/ML IV SOLN
500.0000 [IU] | Freq: Once | INTRAVENOUS | Status: AC | PRN
  Administered 2018-07-18: 500 [IU]

## 2018-07-18 MED ORDER — SODIUM CHLORIDE 0.9 % IV SOLN
20.0000 mg | Freq: Once | INTRAVENOUS | Status: AC
  Administered 2018-07-18: 20 mg via INTRAVENOUS
  Filled 2018-07-18: qty 2

## 2018-07-18 MED ORDER — SODIUM CHLORIDE 0.9 % IV SOLN
570.0000 mg | Freq: Once | INTRAVENOUS | Status: AC
  Administered 2018-07-18: 570 mg via INTRAVENOUS
  Filled 2018-07-18: qty 57

## 2018-07-18 MED ORDER — SODIUM CHLORIDE 0.9 % IV SOLN
Freq: Once | INTRAVENOUS | Status: AC
  Administered 2018-07-18: 09:00:00 via INTRAVENOUS
  Filled 2018-07-18: qty 250

## 2018-07-18 MED ORDER — PALONOSETRON HCL INJECTION 0.25 MG/5ML
0.2500 mg | Freq: Once | INTRAVENOUS | Status: AC
  Administered 2018-07-18: 0.25 mg via INTRAVENOUS
  Filled 2018-07-18: qty 5

## 2018-07-18 MED ORDER — FAMOTIDINE IN NACL 20-0.9 MG/50ML-% IV SOLN
20.0000 mg | Freq: Once | INTRAVENOUS | Status: AC
  Administered 2018-07-18: 20 mg via INTRAVENOUS
  Filled 2018-07-18: qty 50

## 2018-07-18 MED ORDER — SODIUM CHLORIDE 0.9 % IV SOLN
135.0000 mg/m2 | Freq: Once | INTRAVENOUS | Status: AC
  Administered 2018-07-18: 264 mg via INTRAVENOUS
  Filled 2018-07-18: qty 44

## 2018-07-18 NOTE — Progress Notes (Signed)
Patient here for follow up. Pt wants to know if Dr. Tasia Catchings would refill Diprolene cream and Desowen cream which were originally prescribed by [place], where she use to work.

## 2018-07-18 NOTE — Progress Notes (Signed)
Hematology/Oncology Follow up  note [facility] [phone] [phone]   Patient Care Team: Chrismon, Vickki Muff, PA as PCP - General (Family Medicine) REASON FOR VISIT Follow up for treatment of uterus carcinosarcoma  HISTORY OF PRESENTING ILLNESS:  Katherine Snyder is a  67 y.o.  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 Pathology showed  A uterus with cervix hysterectomy -Carcinosarcoma B sentinel lymph node, right primary obturator negative C lymph node right external iliac lymph negative D fallopian tube and ovary, unilateral right negative E sentinel lymph node right low periaortic: Negative F sentinel lymph node left low periaortic negative  CANCER CASE SUMMARY: ENDOMETRIUM  Procedure: Total hysterectomy and bilateral salpingo-oophorectomy  Histologic Type: Carcinosarcoma  Histologic Grade: Not applicable  Myometrial Invasion: Present    Depth of myometrial invasion cannot be determined due to exophytic  tumor growth and effacement of endometrial/myometrial junction    Myometrial thickness: At least 8 mm    Percentage depth of myometrial invasion: Estimated less than 50%  Uterine Serosa Involvement: Not identified  Cervical Stromal Involvement: Not identified  Other Tissue/Organ Involvement: Not identified  Peritoneal/ Ascitic Fluid: Negative for malignancy  Lymphovascular Invasion: Present  Regional Lymph Nodes: All lymph nodes negative for tumor cells  Number of Lymph Nodes Examined: 5    Total number of pelvic nodes examined: 2    Number of pelvic sentinel nodes examined: 1    Total number of para-aortic nodes examined: 3    Number of para-aortic sentinel nodes examined: 3   Pathologic Stage Classification (pTNM, AJCC  8th Edition) (Note J): pT1a  pN0/FIGO IA  Today patient reports feeling well.  Denies any pain.  # Patient's case was discussed and GYN tumor board today.  Consensus decision was to proceed with adjuvant chemotherapy with carboplatin and Taxol for 6 cycles, followed by vaginal brachy therapy.   Pertinent oncology history Katherine Snyder is a 67 y.o. female who has above history reviewed by me today presents for assessment prior to cycle 5 adjuvant chemotherapy management for stage Ia endometrial carcinosarcoma. During cycle 1 treatment, reported feeling burning down and having really bad heartburn.  Taxol was temporarily stopped.  The patient was given Benadryl, Solu-Medrol, Pepcid, symptoms improved and resolved.  Taxol was restarted and patient is able to finish her treatment. Patient reports feeling extremely tired for 2 days after chemotherapy.  She was seen by nurse practitioner Sonia Baller during interval.   She complained burning sensation with urination and had UA and urine culture obtained.  UA was not convincing for UTI urine culture showed less than 10,000 colonies of insignificant growth.  Patient remains afebrile. Patient also reports her chronic fasciitis of foot has got a lot worse since the start of chemotherapy.  Possible neuropathy was discussed and the patient is reluctant to start gabapentin or Cymbalta.  She was given tramadol 100 mg twice daily.  Patient reports symptoms gradually improved.    Patient was found to have neutropenia so she received Neupogen x 2. She reports bone pain started after Neupogen shots.   Fatigue was severe for a few days after chemotherapy, gradually improved.  Denies any nausea vomiting.  Appetite is good.  Patient appears to be very anxious and emotional.  She says "I am not going to do any more chemotherapy if this is how it  will be".  INTERVAL HISTORY Katherine Snyder is a 67 y.o. female who has above history reviewed by me presents for assessment prior to cycle  5 adjuvant chemotherapy for stage Ia endometrial carcinosarcoma.   Problems and complaints are listed below: #Chemotherapy, she reports tolerating last chemotherapy well.  Feels mild nausea without vomiting.  Manageable with antinausea medication. Appetite is fair.  #Fatigue, moderate fatigue after chemotherapy treatment, getting better at the end of cycle. #Neuropathy, bilateral feet burning the pain improved after taking gabapentin 300mg  3 times daily  #Bilateral lower extremity swelling, continue to have this problem.  Usually improves after leg elevation. #Patient mentions that she started to have floaters in her right eye.  Asks if this is related to chemotherapy.  Review of Systems  Constitutional: Positive for malaise/fatigue. Negative for chills, fever and weight loss.  HENT: Negative for congestion, ear discharge, ear pain, nosebleeds, sinus pain and sore throat.   Eyes: Negative for double vision, photophobia, pain, discharge and redness.       Right eye floaters  Respiratory: Negative for cough, hemoptysis, sputum production, shortness of breath and wheezing.   Cardiovascular: Negative for chest pain, palpitations, orthopnea, claudication and leg swelling.  Gastrointestinal: Negative for abdominal pain, blood in stool, constipation, diarrhea, heartburn, melena, nausea and vomiting.  Genitourinary: Negative for dysuria, flank pain, frequency and hematuria.  Musculoskeletal: Negative for back pain, myalgias and neck pain.  Skin: Negative for itching and rash.  Neurological: Negative for dizziness, tingling, tremors, focal weakness, weakness and headaches.       Bilateral foot burning sensation/ Pain improved.   Endo/Heme/Allergies: Negative for environmental allergies. Does not bruise/bleed easily.  Psychiatric/Behavioral: Negative for depression and hallucinations. The patient is not nervous/anxious.     MEDICAL HISTORY:  Past Medical History:  Diagnosis Date  . Arthritis     . Cancer [facility])    endometrial  . Endometrial mass   . History of chicken pox   . History of kidney stones   . History of measles   . History of mumps   . Osteoporosis   . PMB (postmenopausal bleeding)   . Psoriasis     SURGICAL HISTORY: Past Surgical History:  Procedure Laterality Date  . APPENDECTOMY  1960  . BREAST SURGERY Left    biopsy  . Injection varicose veins in legs Bilateral 2010   Dr. Hulda Humphrey  . LITHOTRIPSY  1994, 2004   for renal stones  . PORTA CATH INSERTION N/A 04/24/2018   Procedure: PORTA CATH INSERTION;  Surgeon: Katha Cabal, MD;  Location: [facility];  Service: Cardiovascular;  Laterality: N/A;  . ROBOTIC ASSISTED TOTAL HYSTERECTOMY Bilateral 04/04/2018   Procedure: ROBOTIC ASSISTED TOTAL HYSTERECTOMY,SENTINEL LYMPH NODE MAPPING AND BIOPSIES,AORTIC LYMPH NODE DISSECTION;  Surgeon: Gillis Ends, MD;  Location: [facility];  Service: Gynecology;  Laterality: Bilateral;  . TUBAL LIGATION  1980    SOCIAL HISTORY: Social History   Socioeconomic History  . Marital status: Single    Spouse name: Not on file  . Number of children: 1  . Years of education: Not on file  . Highest education level: Not on file  Occupational History  . Occupation: Employed    Comment: Works at a [employer] doing [employer]  Social Needs  . Financial resource strain: Not on file  . Food insecurity:    Worry: Not on file    Inability: Not on file  . Transportation needs:    Medical: Not on file  Non-medical: Not on file  Tobacco Use  . Smoking status: Current Every Day Smoker    Packs/day: 0.25    Types: Cigarettes  . Smokeless tobacco: Never Used  Substance and Sexual Activity  . Alcohol use: No    Alcohol/week: 0.0 standard drinks  . Drug use: No  . Sexual activity: Not Currently  Lifestyle  . Physical activity:    Days per week: Not on file    Minutes per session: Not on file  . Stress: Not on file  Relationships  . Social connections:     Talks on phone: Not on file    Gets together: Not on file    Attends religious service: Not on file    Active member of club or organization: Not on file    Attends meetings of clubs or organizations: Not on file    Relationship status: Not on file  . Intimate partner violence:    Fear of current or ex partner: Not on file    Emotionally abused: Not on file    Physically abused: Not on file    Forced sexual activity: Not on file  Other Topics Concern  . Not on file  Social History Narrative  . Not on file    FAMILY HISTORY: Family History  Problem Relation Age of Onset  . Breast cancer Mother   . Transient ischemic attack Mother   . Arthritis Mother   . Hypothyroidism Mother   . Lung cancer Mother   . Diabetes Father        type 2  . Hypertension Father   . Arthritis Father   . Colon cancer Sister   . Prostate cancer Brother   . Hyperlipidemia Son   . Stomach cancer Maternal Grandmother   . Stroke Maternal Grandfather     ALLERGIES:  has No Known Allergies.  MEDICATIONS:  Current Outpatient Medications  Medication Sig Dispense Refill  . acetaminophen (TYLENOL) 325 MG tablet Take 650 mg by mouth every 6 (six) hours as needed.    Marland Kitchen antiseptic oral rinse (BIOTENE) LIQD 15 mLs by Mouth Rinse route as needed for dry mouth. 1 Bottle 1  . betamethasone dipropionate (DIPROLENE) 0.05 % cream Apply topically 2 (two) times daily. As directed for psoriasis on body. (Patient taking differently: Apply 1 application topically 2 (two) times daily as needed (for psoriasis on body). ) 30 g 1  . desonide (DESOWEN) 0.05 % cream Apply once a day sparingly to seborrhea as needed on face. (Patient taking differently: Apply 1 application topically 2 (two) times daily as needed (for psoriasis on face). ) 30 g 1  . dexamethasone (DECADRON) 4 MG tablet Take 2 tablets (8 mg total) by mouth daily. Start the day after chemotherapy for 2 days. 30 tablet 1  . gabapentin (NEURONTIN) 300 MG capsule TAKE  1 CAPSULE BY MOUTH THREE TIMES A DAY 270 capsule 1  . lidocaine-prilocaine (EMLA) cream Apply to affected area once 30 g 3  . Multiple Vitamins-Minerals (CENTRUM SILVER ADULT 50+ PO) Take 1 tablet by mouth daily.    . ondansetron (ZOFRAN) 8 MG tablet Take 1 tablet (8 mg total) by mouth 2 (two) times daily as needed for refractory nausea / vomiting. 30 tablet 1  . potassium chloride (K-DUR) 10 MEQ tablet Take 1 tablet (10 mEq total) by mouth daily. 30 tablet 1  . prochlorperazine (COMPAZINE) 10 MG tablet Take 1 tablet (10 mg total) by mouth every 6 (six) hours as needed (Nausea or vomiting).  30 tablet 1  . ranitidine (ZANTAC) 150 MG tablet Take 150 mg by mouth daily.    Marland Kitchen triamcinolone (KENALOG) 0.025 % cream Apply 1 application topically 2 (two) times daily. 30 g 0  . magic mouthwash w/lidocaine SOLN Take 5 mLs by mouth 4 (four) times daily. (Patient not taking: Reported on 07/18/2018) 240 mL 0  . oxyCODONE (ROXICODONE) 5 MG immediate release tablet Take 1 tablet (5 mg total) by mouth every 4 (four) hours as needed. (Patient not taking: Reported on 07/18/2018) 16 tablet 0  . traMADol (ULTRAM) 50 MG tablet Take 2 tablets (100 mg total) by mouth 3 (three) times daily as needed for moderate pain. (Patient not taking: Reported on 06/06/2018) 60 tablet 0   No current facility-administered medications for this visit.    Facility-Administered Medications Ordered in Other Visits  Medication Dose Route Frequency Provider Last Rate Last Dose  . CARBOplatin (PARAPLATIN) 570 mg in sodium chloride 0.9 % 250 mL chemo infusion  570 mg Intravenous Once Earlie Server, MD      . heparin lock flush 100 unit/mL  500 Units Intracatheter Once PRN Earlie Server, MD      . PACLitaxel (TAXOL) 264 mg in sodium chloride 0.9 % 250 mL chemo infusion (> 80mg /m2)  135 mg/m2 (Treatment Plan Recorded) Intravenous Once Earlie Server, MD 98 mL/hr at 07/18/18 1017 264 mg at 07/18/18 1017     PHYSICAL EXAMINATION: ECOG PERFORMANCE STATUS: 0 -  Asymptomatic Vitals:   07/18/18 0840  BP: 108/71  Pulse: 69  Resp: 18  Temp: 98.4 F (36.9 C)   Filed Weights   07/18/18 0840  Weight: 178 lb 1.6 oz (80.8 kg)    Physical Exam  Constitutional: She is oriented to person, place, and time. She appears well-developed and well-nourished. No distress.  HENT:  Head: Normocephalic and atraumatic.  Right Ear: External ear normal.  Left Ear: External ear normal.  Mouth/Throat: Oropharynx is clear and moist.  Eyes: Pupils are equal, round, and reactive to light. Conjunctivae and EOM are normal. No scleral icterus.  Neck: Normal range of motion. Neck supple.  Cardiovascular: Normal rate, regular rhythm and normal heart sounds.  Pulmonary/Chest: Effort normal and breath sounds normal. No respiratory distress. She has no wheezes. She has no rales. She exhibits no tenderness.  Abdominal: Soft. Bowel sounds are normal. She exhibits no distension and no mass. There is no tenderness.  Musculoskeletal: Normal range of motion. She exhibits edema. She exhibits no deformity.  Bilateral 1+ pitting edema  Lymphadenopathy:    She has no cervical adenopathy.  Neurological: She is alert and oriented to person, place, and time. She displays normal reflexes. No cranial nerve deficit. Coordination normal.  Skin: Skin is warm and dry. No rash noted. No erythema.  Psychiatric: She has a normal mood and affect. Her behavior is normal. Thought content normal.     LABORATORY DATA:  I have reviewed the data as listed Lab Results  Component Value Date   WBC 5.9 07/18/2018   HGB 11.1 (L) 07/18/2018   HCT 32.2 (L) 07/18/2018   MCV 96.2 07/18/2018   PLT 180 07/18/2018   Recent Labs    06/06/18 0746 06/27/18 0758 07/18/18 0759  NA 141 138 141  K 3.6 3.4* 3.5  CL 107 106 109  CO2 22 25 25   GLUCOSE 131* 128* 126*  BUN 16 15 15   CREATININE 0.75 0.70 0.66  CALCIUM 9.1 8.8* 8.9  GFRNONAA >60 >60 >60  GFRAA >60 >60 >60  PROT 7.5 6.7 6.5  ALBUMIN 4.0  3.7 3.7  AST 28 29 22   ALT 24 32 18  ALKPHOS 66 69 66  BILITOT 0.5 0.6 0.6   Iron/TIBC/Ferritin/ %Sat No results found for: IRON, TIBC, FERRITIN, IRONPCTSAT   RADIOGRAPHIC STUDIES: I have personally reviewed the radiological images as listed and agreed with the findings in the report. 07/11/2018 IMPRESSION: 1. Previously visualized pulmonary nodules are either stable or resolved. Several new subcentimeter bilateral pulmonary nodules. Pulmonary nodularity generally appears centrilobular in distribution and while considered indeterminate is more suggestive of infectious/inflammatory nodularity. Continued chest CT surveillance is recommended in 3-6 months. 2. No thoracic adenopathy.  ASSESSMENT & PLAN:  1. Endometrial cancer (HCC)   2. Lung nodules   3. Encounter for antineoplastic chemotherapy   4. Fatigue, unspecified type   5. Neuropathy   6. Leg swelling   7. Vitreous floaters of right eye   Stage IA carcinosarcoma #Labs reviewed and discussed with patient.  Counts are acceptable to proceed with cycle 5 carboplatin AUC 5 and Taxol 135 mg/m.  She will receive Udenyca day 2 or day 3 for bone marrow support. She takes Claritin before and after for bone pain related to G-CSF.  #Neuropathy of bilateral feet, symptom is well controlled with tramadol as needed and gabapentin 300 mg 3 times daily. #Hypokalemia, resolved.  Potassium level was 3.5 today.  Continue take potassium chloride 10 mEq daily. #Bilateral lower extremity swelling, continue leg elevation and low-salt diet. #Lung nodules, during the interval patient has had a repeat CT scan done.  CT scan was independently reviewed by me and discussed with patient.  Multiple lung nodules either stable or resolved.  Indicating inflammatory process. #Right eye fluids, possible degeneration changes.  I recommend patient to establish care with ophthalmologist for further evaluation.  We spent sufficient time to discuss many aspect of care,  questions were answered to patient's satisfaction. The patient knows to call the clinic with any problems questions or concerns.  Return of visit: 3 weeks    Earlie Server, MD, PhD Hematology Oncology [facility] at [facility] Pager- [phone] 07/18/2018

## 2018-07-20 ENCOUNTER — Inpatient Hospital Stay: Payer: BLUE CROSS/BLUE SHIELD

## 2018-07-20 ENCOUNTER — Other Ambulatory Visit: Payer: Self-pay | Admitting: Oncology

## 2018-07-20 DIAGNOSIS — Z5111 Encounter for antineoplastic chemotherapy: Secondary | ICD-10-CM | POA: Diagnosis not present

## 2018-07-20 DIAGNOSIS — C541 Malignant neoplasm of endometrium: Secondary | ICD-10-CM

## 2018-07-20 MED ORDER — PEGFILGRASTIM-CBQV 6 MG/0.6ML ~~LOC~~ SOSY
6.0000 mg | PREFILLED_SYRINGE | Freq: Once | SUBCUTANEOUS | Status: AC
  Administered 2018-07-20: 6 mg via SUBCUTANEOUS

## 2018-07-23 NOTE — Telephone Encounter (Signed)
   Ref Range & Units 5d ago  Potassium 3.5 - 5.1 mmol/L 3.5         

## 2018-08-08 ENCOUNTER — Encounter: Payer: Self-pay | Admitting: Oncology

## 2018-08-08 ENCOUNTER — Inpatient Hospital Stay: Payer: BLUE CROSS/BLUE SHIELD

## 2018-08-08 ENCOUNTER — Inpatient Hospital Stay (HOSPITAL_BASED_OUTPATIENT_CLINIC_OR_DEPARTMENT_OTHER): Payer: BLUE CROSS/BLUE SHIELD | Admitting: Oncology

## 2018-08-08 ENCOUNTER — Ambulatory Visit: Payer: BLUE CROSS/BLUE SHIELD

## 2018-08-08 ENCOUNTER — Inpatient Hospital Stay: Payer: BLUE CROSS/BLUE SHIELD | Attending: Obstetrics and Gynecology | Admitting: Obstetrics and Gynecology

## 2018-08-08 ENCOUNTER — Other Ambulatory Visit: Payer: Self-pay

## 2018-08-08 ENCOUNTER — Encounter: Payer: Self-pay | Admitting: Obstetrics and Gynecology

## 2018-08-08 VITALS — BP 118/74 | HR 65 | Temp 97.0°F | Resp 18 | Ht 68.0 in | Wt 179.8 lb

## 2018-08-08 VITALS — BP 118/74 | HR 65 | Temp 97.0°F | Resp 18 | Wt 179.5 lb

## 2018-08-08 DIAGNOSIS — R600 Localized edema: Secondary | ICD-10-CM

## 2018-08-08 DIAGNOSIS — G629 Polyneuropathy, unspecified: Secondary | ICD-10-CM | POA: Diagnosis not present

## 2018-08-08 DIAGNOSIS — C541 Malignant neoplasm of endometrium: Secondary | ICD-10-CM | POA: Diagnosis not present

## 2018-08-08 DIAGNOSIS — Z720 Tobacco use: Secondary | ICD-10-CM | POA: Diagnosis not present

## 2018-08-08 DIAGNOSIS — R413 Other amnesia: Secondary | ICD-10-CM | POA: Diagnosis not present

## 2018-08-08 DIAGNOSIS — Z90710 Acquired absence of both cervix and uterus: Secondary | ICD-10-CM | POA: Diagnosis not present

## 2018-08-08 DIAGNOSIS — Z5111 Encounter for antineoplastic chemotherapy: Secondary | ICD-10-CM | POA: Diagnosis not present

## 2018-08-08 DIAGNOSIS — Z923 Personal history of irradiation: Secondary | ICD-10-CM | POA: Diagnosis not present

## 2018-08-08 DIAGNOSIS — Z90722 Acquired absence of ovaries, bilateral: Secondary | ICD-10-CM | POA: Diagnosis not present

## 2018-08-08 DIAGNOSIS — R918 Other nonspecific abnormal finding of lung field: Secondary | ICD-10-CM | POA: Insufficient documentation

## 2018-08-08 LAB — CBC WITH DIFFERENTIAL/PLATELET
Basophils Absolute: 0 10*3/uL (ref 0–0.1)
Basophils Relative: 1 %
Eosinophils Absolute: 0.1 10*3/uL (ref 0–0.7)
Eosinophils Relative: 2 %
HCT: 33.5 % — ABNORMAL LOW (ref 35.0–47.0)
Hemoglobin: 11.4 g/dL — ABNORMAL LOW (ref 12.0–16.0)
Lymphocytes Relative: 29 %
Lymphs Abs: 1.3 10*3/uL (ref 1.0–3.6)
MCH: 33.9 pg (ref 26.0–34.0)
MCHC: 34.1 g/dL (ref 32.0–36.0)
MCV: 99.3 fL (ref 80.0–100.0)
Monocytes Absolute: 0.6 10*3/uL (ref 0.2–0.9)
Monocytes Relative: 13 %
Neutro Abs: 2.5 10*3/uL (ref 1.4–6.5)
Neutrophils Relative %: 55 %
Platelets: 188 10*3/uL (ref 150–440)
RBC: 3.37 MIL/uL — ABNORMAL LOW (ref 3.80–5.20)
RDW: 17.3 % — ABNORMAL HIGH (ref 11.5–14.5)
WBC: 4.5 10*3/uL (ref 3.6–11.0)

## 2018-08-08 LAB — COMPREHENSIVE METABOLIC PANEL
ALT: 15 U/L (ref 0–44)
AST: 19 U/L (ref 15–41)
Albumin: 3.8 g/dL (ref 3.5–5.0)
Alkaline Phosphatase: 58 U/L (ref 38–126)
Anion gap: 9 (ref 5–15)
BUN: 16 mg/dL (ref 8–23)
CO2: 25 mmol/L (ref 22–32)
Calcium: 9.3 mg/dL (ref 8.9–10.3)
Chloride: 109 mmol/L (ref 98–111)
Creatinine, Ser: 0.74 mg/dL (ref 0.44–1.00)
GFR calc Af Amer: >60 mL/min (ref >60)
GFR calc non Af Amer: >60 mL/min (ref >60)
Glucose, Bld: 93 mg/dL (ref 70–99)
Potassium: 4 mmol/L (ref 3.5–5.1)
Sodium: 143 mmol/L (ref 135–145)
Total Bilirubin: 0.3 mg/dL (ref 0.3–1.2)
Total Protein: 6.9 g/dL (ref 6.5–8.1)

## 2018-08-08 MED ORDER — SODIUM CHLORIDE 0.9% FLUSH
10.0000 mL | INTRAVENOUS | Status: DC | PRN
  Administered 2018-08-08: 10 mL
  Filled 2018-08-08: qty 10

## 2018-08-08 MED ORDER — SODIUM CHLORIDE 0.9 % IV SOLN
573.5000 mg | Freq: Once | INTRAVENOUS | Status: AC
  Administered 2018-08-08: 570 mg via INTRAVENOUS
  Filled 2018-08-08: qty 57

## 2018-08-08 MED ORDER — DIPHENHYDRAMINE HCL 50 MG/ML IJ SOLN
50.0000 mg | Freq: Once | INTRAMUSCULAR | Status: AC
  Administered 2018-08-08: 50 mg via INTRAVENOUS
  Filled 2018-08-08: qty 1

## 2018-08-08 MED ORDER — SODIUM CHLORIDE 0.9 % IV SOLN
Freq: Once | INTRAVENOUS | Status: AC
  Administered 2018-08-08: 09:00:00 via INTRAVENOUS
  Filled 2018-08-08: qty 250

## 2018-08-08 MED ORDER — HEPARIN SOD (PORK) LOCK FLUSH 100 UNIT/ML IV SOLN
500.0000 [IU] | Freq: Once | INTRAVENOUS | Status: AC | PRN
  Administered 2018-08-08: 500 [IU]
  Filled 2018-08-08: qty 5

## 2018-08-08 MED ORDER — PALONOSETRON HCL INJECTION 0.25 MG/5ML
0.2500 mg | Freq: Once | INTRAVENOUS | Status: AC
  Administered 2018-08-08: 0.25 mg via INTRAVENOUS
  Filled 2018-08-08: qty 5

## 2018-08-08 MED ORDER — FAMOTIDINE IN NACL 20-0.9 MG/50ML-% IV SOLN
20.0000 mg | Freq: Once | INTRAVENOUS | Status: AC
  Administered 2018-08-08: 20 mg via INTRAVENOUS
  Filled 2018-08-08: qty 50

## 2018-08-08 MED ORDER — SODIUM CHLORIDE 0.9 % IV SOLN
135.0000 mg/m2 | Freq: Once | INTRAVENOUS | Status: AC
  Administered 2018-08-08: 264 mg via INTRAVENOUS
  Filled 2018-08-08: qty 44

## 2018-08-08 MED ORDER — SODIUM CHLORIDE 0.9 % IV SOLN
20.0000 mg | Freq: Once | INTRAVENOUS | Status: AC
  Administered 2018-08-08: 20 mg via INTRAVENOUS
  Filled 2018-08-08: qty 2

## 2018-08-08 NOTE — Progress Notes (Signed)
Gynecologic Oncology Interval Visit   Referring Provider: Dr. Amalia Hailey  Chief Complaint: Endometrial Cancer  Subjective:  Katherine Snyder is a 67 y.o. 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   She has received her 6th cycle of carbo-taxol today and will see Dr. Baruch Gouty for consideration of vaginal brachytherapy on 08/09/18. She has tolerated treatment moderately well but has had worsening plantar fasciitis since starting chemotherapy. Some mild neuropathy in hands and feet.   07/11/18- CT Chest WO Contrast for follow-up on pulmonary nodules IMPRESSION: 1. Previously visualized pulmonary nodules are either stable or resolved. Several new subcentimeter bilateral pulmonary nodules. Pulmonary nodularity generally appears centrilobular in distribution and while considered indeterminate is more suggestive of infectious/inflammatory nodularity. Continued chest CT surveillance is recommended in 3-6 months. 2. No thoracic adenopathy.   GYNECOLOGIC ONCOLOGY HISTORY:  Initially, patient presented to ER on 02/26/18 for PMB. She was reportedly 14 years post menopausal and had had a 2 day episode of vaginal bleeding that was constant.   02/26/18- Ultrasound Pelvic Complete w/ Transvaginal: Endometrium thickness up to 33mm. No focal solid or cystic change. IMPRESSION: Mildly enlarged uterus. No discrete fibroids. Markedly thickened endometrium. In the setting of post-menopausal bleeding, endometrial sampling is indicated to exclude carcinoma. If results are benign, sonohysterogram should be considered for focal lesion work-up. (Ref: Radiological Reasoning: Algorithmic Workup of Abnormal Vaginal Bleeding with Endovaginal Sonography and Sonohysterography. AJR 2008; 563:J49-70) Nonvisualization of the right ovary. Normal-appearing left ovary. No significant uterine fibroids were observed.  She was seen by Dr. Ellard Artis son 03/01/18  who performed vacurette endometrial biopsy. Uterus was sounded to length to 9 cm. She was started on Aygestin and bleeding stopped.   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  COMMENT:  BASED ON INITIAL HISTOLOGIC FINDINGS, IMMUNOHISTOCHEMISTRY IS EVALUATED:  THE SARCOMATOUS TUMOR COMPONENT IS DIFFUSELY POSITIVE FOR CD10 AND THE CARCINOMA COMPONENT IS POSITIVE FOR PANKERATIN, SUPPORTING THE DIAGNOSIS.   She was seen by Dr. Fransisca Connors, Gyn-Onc on 03/28/2018 for initial evaluation and treatment planning.  TLH BSO with sentinel lymph node mapping and biopsies and aortic lymph node dissection were discussed.  Surgery at [facility] was recommended but unfortunately, patient's insurance will not cover.  Discussed with Dr. Fransisca Connors and Dr. Theora Gianotti who gave okay for surgery at [facility] as soon as possible.   CA 125- 10.5  03/30/18 - CT C/A/P W CONTRAST IMPRESSION: 1. Mass like expansion of the endometrial cavity known primary endometrial carcinoma. 2. No evidence for nodal metastasis or solid organ metastasis within the abdomen or pelvis. 3. Scattered nonspecific solid nodules and a single part solid nodule identified in both lungs. Consensus criteria recommendations for nodule followup does not apply to patients with known primary malignancy. 4. Liver cysts.  She underwent CT Imaging and robot assisted total hysterectomy with sentinel LN mapping, biopsies, and aortic LN dissection with Dr. Theora Gianotti at [facility] 04/04/18.   A. UTERUS WITH CERVIX; HYSTERECTOMY:  - CARCINOSARCOMA.  - MYOMETRIAL INVASION PRESENT, LESS THAN HALF OF THE MYOMETRIUM.  - NEGATIVE FOR CERVICAL AND SEROSAL INVOLVEMENT.  - SUBMUCOSAL LEIOMYOMA.   FALLOPIAN TUBE AND OVARY, LEFT; SALPINGO-OOPHORECTOMY:  - NEGATIVE FOR MALIGNANCY.  - OVARIAN CORTICAL INCLUSION CYSTS.  - ATROPHIC FALLOPIAN TUBE.   B. SENTINEL LYMPH  NODE, RIGHT PRIMARY OBTURATOR; EXCISION:  - NEGATIVE FOR MALIGNANCY, ONE LYMPH NODE (0/1).   C. LYMPH NODE, RIGHT  EXTERNAL ILIAC VEIN; EXCISION:  - NEGATIVE FOR MALIGNANCY, ONE LYMPH NODE (0/1).   D. FALLOPIAN TUBE AND OVARY, UNILATERAL (RIGHT); SALPINGO-OOPHORECTOMY:  - NEGATIVE FOR MALIGNANCY.  - OVARIAN CORTICAL INCLUSION CYSTS.  - ATROPHIC FALLOPIAN TUBE.   E. SENTINEL LYMPH NODE, RIGHT LOW PARA-AORTIC; EXCISION:  - NEGATIVE FOR MALIGNANCY, TWO LYMPH NODES (0/2).   F. SENTINEL LYMPH NODE, LEFT PRIMARY LOW PARA-AORTIC; EXCISION:  - NEGATIVE FOR MALIGNANCY, ONE LYMPH NODE (0/1).   DIAGNOSIS:  A. PELVIC WASHINGS:  - NEGATIVE FOR MALIGNANCY.  - MESOTHELIAL CELLS AND MACROPHAGES.  Her case was discussed at [place] and discussed that recurrence risk without adjuvant treatment is up to 50%. So recommend adjuvant chemo and radiation.  She was seen by Dr. 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   Problem List: Patient Active Problem List   Diagnosis Date Noted  . Neuropathy 05/16/2018  . Fatigue 05/16/2018  . Inflammatory heel pain, right 05/16/2018  . Encounter for antineoplastic chemotherapy 05/16/2018  . Drug-induced neutropenia ([place]) 05/09/2018  . Goals of care, counseling/discussion 04/18/2018  . Endometrial cancer ([place])   . Seborrhea 05/13/2016  . History of kidney stones 06/26/2015  . OP (osteoporosis) 06/26/2015  . Plantar fasciitis 06/26/2015  . Current tobacco use 06/26/2015  . Chronic airway obstruction ([place]) 10/08/2009  . Psoriasis 09/30/2008  . 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 Menopausal symptom 09/21/2007  . Tobacco use 09/21/2007  . Awareness of heartbeats 09/21/2007    Past Medical History: Past Medical History:  Diagnosis Date  . Arthritis   . Cancer [facility])    endometrial  . Endometrial mass   . History of chicken pox   .  History of kidney stones   . History of measles   . History of mumps   . Osteoporosis   . PMB (postmenopausal bleeding)   . Psoriasis     Past Surgical History: Past Surgical History:  Procedure Laterality Date  . APPENDECTOMY  1960  . BREAST SURGERY Left    biopsy  . Injection varicose veins in legs Bilateral 2010   Dr. Hulda Humphrey  . LITHOTRIPSY  1994, 2004   for renal stones  . PORTA CATH INSERTION N/A 04/24/2018   Procedure: PORTA CATH INSERTION;  Surgeon: Katha Cabal, MD;  Location: [facility];  Service: Cardiovascular;  Laterality: N/A;  . ROBOTIC ASSISTED TOTAL HYSTERECTOMY Bilateral 04/04/2018   Procedure: ROBOTIC ASSISTED TOTAL HYSTERECTOMY,SENTINEL LYMPH NODE MAPPING AND BIOPSIES,AORTIC LYMPH NODE DISSECTION;  Surgeon: Gillis Ends, MD;  Location: [facility];  Service: Gynecology;  Laterality: Bilateral;  . TUBAL LIGATION  1980     OB History:  OB History  Gravida Para Term Preterm AB Living  1 1 1     1   SAB TAB Ectopic Multiple Live Births          1    # Outcome Date GA Lbr Len/2nd Weight Sex Delivery Anes PTL Lv  1 Term 19    M Vag-Spont   LIV  G1P1001 LMP:   Family History: Family History  Problem Relation Age of Onset  . Breast cancer Mother   . Transient ischemic attack Mother   . Arthritis Mother   . Hypothyroidism Mother   . Lung cancer Mother   . Diabetes Father        type 2  . Hypertension Father   . Arthritis Father   . Colon  cancer Sister   . Prostate cancer Brother   . Hyperlipidemia Son   . Stomach cancer Maternal Grandmother   . Stroke Maternal Grandfather     Social History: Social History   Socioeconomic History  . Marital status: Single    Spouse name: Not on file  . Number of children: 1  . Years of education: Not on file  . Highest education level: Not on file  Occupational History  . Occupation: Employed    Comment: Works at a [employer] doing [employer]  Social Needs  . Financial resource strain:  Not on file  . Food insecurity:    Worry: Not on file    Inability: Not on file  . Transportation needs:    Medical: Not on file    Non-medical: Not on file  Tobacco Use  . Smoking status: Current Every Day Smoker    Packs/day: 0.25    Types: Cigarettes  . Smokeless tobacco: Never Used  Substance and Sexual Activity  . Alcohol use: No    Alcohol/week: 0.0 standard drinks  . Drug use: No  . Sexual activity: Not Currently  Lifestyle  . Physical activity:    Days per week: Not on file    Minutes per session: Not on file  . Stress: Not on file  Relationships  . Social connections:    Talks on phone: Not on file    Gets together: Not on file    Attends religious service: Not on file    Active member of club or organization: Not on file    Attends meetings of clubs or organizations: Not on file    Relationship status: Not on file  . Intimate partner violence:    Fear of current or ex partner: Not on file    Emotionally abused: Not on file    Physically abused: Not on file    Forced sexual activity: Not on file  Other Topics Concern  . Not on file  Social History Narrative  . Not on file    Allergies: No Known Allergies  Current Medications: Current Outpatient Medications  Medication Sig Dispense Refill  . acetaminophen (TYLENOL) 325 MG tablet Take 650 mg by mouth every 6 (six) hours as needed.    Marland Kitchen antiseptic oral rinse (BIOTENE) LIQD 15 mLs by Mouth Rinse route as needed for dry mouth. 1 Bottle 1  . betamethasone dipropionate (DIPROLENE) 0.05 % cream Apply topically 2 (two) times daily. As directed for psoriasis on body. (Patient taking differently: Apply 1 application topically 2 (two) times daily as needed (for psoriasis on body). ) 30 g 1  . desonide (DESOWEN) 0.05 % cream Apply once a day sparingly to seborrhea as needed on face. (Patient taking differently: Apply 1 application topically 2 (two) times daily as needed (for psoriasis on face). ) 30 g 1  . dexamethasone  (DECADRON) 4 MG tablet Take 2 tablets (8 mg total) by mouth daily. Start the day after chemotherapy for 2 days. 30 tablet 1  . gabapentin (NEURONTIN) 300 MG capsule TAKE 1 CAPSULE BY MOUTH THREE TIMES A DAY 270 capsule 1  . lidocaine-prilocaine (EMLA) cream Apply to affected area once 30 g 3  . magic mouthwash w/lidocaine SOLN Take 5 mLs by mouth 4 (four) times daily. 240 mL 0  . Multiple Vitamins-Minerals (CENTRUM SILVER ADULT 50+ PO) Take 1 tablet by mouth daily.    . ondansetron (ZOFRAN) 8 MG tablet Take 1 tablet (8 mg total) by mouth 2 (two) times  daily as needed for refractory nausea / vomiting. 30 tablet 1  . oxyCODONE (ROXICODONE) 5 MG immediate release tablet Take 1 tablet (5 mg total) by mouth every 4 (four) hours as needed. (Patient not taking: Reported on 07/18/2018) 16 tablet 0  . potassium chloride (K-DUR) 10 MEQ tablet TAKE 1 TABLET BY MOUTH EVERY DAY 30 tablet 1  . prochlorperazine (COMPAZINE) 10 MG tablet Take 1 tablet (10 mg total) by mouth every 6 (six) hours as needed (Nausea or vomiting). 30 tablet 1  . ranitidine (ZANTAC) 150 MG tablet Take 150 mg by mouth daily.    . traMADol (ULTRAM) 50 MG tablet Take 2 tablets (100 mg total) by mouth 3 (three) times daily as needed for moderate pain. (Patient not taking: Reported on 06/06/2018) 60 tablet 0  . triamcinolone (KENALOG) 0.025 % cream Apply 1 application topically 2 (two) times daily. 30 g 0   No current facility-administered medications for this visit.    Facility-Administered Medications Ordered in Other Visits  Medication Dose Route Frequency Provider Last Rate Last Dose  . CARBOplatin (PARAPLATIN) 570 mg in sodium chloride 0.9 % 250 mL chemo infusion  570 mg Intravenous Once Earlie Server, MD      . heparin lock flush 100 unit/mL  500 Units Intracatheter Once PRN Earlie Server, MD      . PACLitaxel (TAXOL) 264 mg in sodium chloride 0.9 % 250 mL chemo infusion (> 80mg /m2)  135 mg/m2 (Treatment Plan Recorded) Intravenous Once Earlie Server, MD  98 mL/hr at 08/08/18 1051 264 mg at 08/08/18 1051  . sodium chloride flush (NS) 0.9 % injection 10 mL  10 mL Intracatheter PRN Earlie Server, MD   10 mL at 08/08/18 0803   Review of Systems General:  no complaints Skin: no complaints Eyes: no complaints HEENT: no complaints Breasts: no complaints Pulmonary: no complaints Cardiac: no complaints Gastrointestinal: no complaints Genitourinary/Sexual: no complaints Ob/Gyn: no complaints Musculoskeletal: no complaints Hematology: no complaints Neurologic/Psych: no complaints   Objective:  Physical Examination:  Vitals:   08/08/18 1512  BP: 118/74  Pulse: 65  Resp: 18  Temp: (!) 97 F (36.1 C)      ECOG Performance Status: 1 - Symptomatic but completely ambulatory  GENERAL: Patient is a well appearing female in no acute distress HEENT:  PERRL, neck supple with midline trachea. Thyroid without masses. Dentures NODES:  No cervical, supraclavicular, axillary, or inguinal lymphadenopathy palpated.  LUNGS:  Clear to auscultation bilaterally.  No wheezes or rhonchi. Smoker HEART:  Regular rate and rhythm. No murmur appreciated. ABDOMEN:  Soft, nontender.  Positive, normoactive bowel sounds. Well healed surgical incisions.  MSK:  No focal spinal tenderness to palpation. Full range of motion bilaterally in the upper extremities. EXTREMITIES:  No peripheral edema.   SKIN:  Clear with no obvious rashes or skin changes. No nail dyscrasia. NEURO:  Nonfocal. Well oriented.  Appropriate affect.  PELVIC: EGBUS: normal, Vagina: healing well.  Bimanual: normal.    Assessment:  Katherine Snyder is a 67 y.o. female diagnosed with stage IA uterine carcinosarcoma with RA TLH/BSO, negative staging 5/19 is here for surveillance. She received her 6th cycle of carbo-taxol chemotherapy. Some neuropathy in hands and feet.  She will see Dr. Baruch Gouty for consideration of vaginal brachytherapy tomorrow.   Comorbidities complicating care: smoker, plantar fasciitis   Plan:   Problem List Items Addressed This Visit      Genitourinary   Endometrial cancer [facility]) - Primary     Patient will  Complete  therapy with vaginal brachytherapy and is seeing Dr Baruch Gouty. We can see her back in three to 4 months.      Mellody Drown, MD  CC:  [place], [place] [address] [place], [place] [address] [phone]

## 2018-08-08 NOTE — Progress Notes (Signed)
Patient here follow up

## 2018-08-08 NOTE — Progress Notes (Signed)
Hematology/Oncology Follow up  note [facility] [phone] [phone]   Patient Care Team: Chrismon, Vickki Muff, PA as PCP - General (Family Medicine) REASON FOR VISIT Follow up for treatment of uterus carcinosarcoma  HISTORY OF PRESENTING ILLNESS:  Katherine Snyder is a  67 y.o.  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 Pathology showed  A uterus with cervix hysterectomy -Carcinosarcoma B sentinel lymph node, right primary obturator negative C lymph node right external iliac lymph negative D fallopian tube and ovary, unilateral right negative E sentinel lymph node right low periaortic: Negative F sentinel lymph node left low periaortic negative  CANCER CASE SUMMARY: ENDOMETRIUM  Procedure: Total hysterectomy and bilateral salpingo-oophorectomy  Histologic Type: Carcinosarcoma  Histologic Grade: Not applicable  Myometrial Invasion: Present    Depth of myometrial invasion cannot be determined due to exophytic  tumor growth and effacement of endometrial/myometrial junction    Myometrial thickness: At least 8 mm    Percentage depth of myometrial invasion: Estimated less than 50%  Uterine Serosa Involvement: Not identified  Cervical Stromal Involvement: Not identified  Other Tissue/Organ Involvement: Not identified  Peritoneal/ Ascitic Fluid: Negative for malignancy  Lymphovascular Invasion: Present  Regional Lymph Nodes: All lymph nodes negative for tumor cells  Number of Lymph Nodes Examined: 5    Total number of pelvic nodes examined: 2    Number of pelvic sentinel nodes examined: 1    Total number of para-aortic nodes examined: 3    Number of para-aortic sentinel nodes examined: 3   Pathologic Stage Classification (pTNM, AJCC  8th Edition) (Note J): pT1a  pN0/FIGO IA  Today patient reports feeling well.  Denies any pain.  # Patient's case was discussed and GYN tumor board today.  Consensus decision was to proceed with adjuvant chemotherapy with carboplatin and Taxol for 6 cycles, followed by vaginal brachy therapy.   Pertinent oncology history Katherine Snyder is a 67 y.o. female who has above history reviewed by me today presents for assessment prior to Cycle 6 adjuvant chemotherapy management for stage Ia endometrial carcinosarcoma. During cycle 1 treatment, reported feeling burning down and having really bad heartburn.  Taxol was temporarily stopped.  The patient was given Benadryl, Solu-Medrol, Pepcid, symptoms improved and resolved.  Taxol was restarted and patient is able to finish her treatment. Patient reports feeling extremely tired for 2 days after chemotherapy.  She was seen by nurse practitioner Sonia Baller during interval.   She complained burning sensation with urination and had UA and urine culture obtained.  UA was not convincing for UTI urine culture showed less than 10,000 colonies of insignificant growth.  Patient remains afebrile. Patient also reports her chronic fasciitis of foot has got a lot worse since the start of chemotherapy.  Possible neuropathy was discussed and the patient is reluctant to start gabapentin or Cymbalta.  She was given tramadol 100 mg twice daily.  Patient reports symptoms gradually improved.    Patient was found to have neutropenia so she received Neupogen x 2. She reports bone pain started after Neupogen shots.   Fatigue was severe for a few days after chemotherapy, gradually improved.  Denies any nausea vomiting.  Appetite is good.  Patient appears to be very anxious and emotional.  She says "I am not going to do any more chemotherapy if this is how it  will be".  INTERVAL HISTORY Katherine Snyder is a 67 y.o. female who has above history reviewed by me presents for assessment prior to Cycle  6 adjuvant chemotherapy for stage Ia endometrial carcinosarcoma.  Problems and complaints are listed below: #Chemotherapy, she reports tolerating chemotherapy well.  Mild nausea without vomiting.  Manageable. #Reports sudden onset of memory loss on 07/23/2018.  Reports not able to remember what she did that day.  Family found her not remember if she has taken any medication for that day or has eaten anything for today.  Per patient, family called and left a message and was off hours.  No one returned a phone call. The next day, patient's memory loss's resolved.  No residual memory loss.  No additional episodes.  Denies any headache, seizure-like activities.   #Fatigue, chronic moderate fatigue at baseline. #Neuropathy, bilateral feet hurting improved after taking gabapentin.  Currently she is on 300 mg 3 times daily. #Bilateral lower extremity swelling, improved after leg elevation.   Review of Systems  Constitutional: Positive for malaise/fatigue. Negative for chills, fever and weight loss.  HENT: Negative for congestion, ear discharge, ear pain, nosebleeds, sinus pain and sore throat.   Eyes: Negative for double vision, photophobia, pain, discharge and redness.       Right eye floaters  Respiratory: Negative for cough, hemoptysis, sputum production, shortness of breath and wheezing.   Cardiovascular: Negative for chest pain, palpitations, orthopnea, claudication and leg swelling.  Gastrointestinal: Negative for abdominal pain, blood in stool, constipation, diarrhea, heartburn, melena, nausea and vomiting.  Genitourinary: Negative for dysuria, flank pain, frequency and hematuria.  Musculoskeletal: Negative for back pain, myalgias and neck pain.  Skin: Negative for itching and rash.  Neurological: Negative for dizziness, tingling, tremors, focal weakness, weakness and headaches.       Bilateral foot burning sensation/ Pain improved.   Endo/Heme/Allergies: Negative for environmental allergies.  Does not bruise/bleed easily.  Psychiatric/Behavioral: Positive for memory loss. Negative for depression and hallucinations. The patient is nervous/anxious.     MEDICAL HISTORY:  Past Medical History:  Diagnosis Date  . Arthritis   . Cancer [facility])    endometrial  . Endometrial mass   . History of chicken pox   . History of kidney stones   . History of measles   . History of mumps   . Osteoporosis   . PMB (postmenopausal bleeding)   . Psoriasis     SURGICAL HISTORY: Past Surgical History:  Procedure Laterality Date  . APPENDECTOMY  1960  . BREAST SURGERY Left    biopsy  . Injection varicose veins in legs Bilateral 2010   Dr. Hulda Humphrey  . LITHOTRIPSY  1994, 2004   for renal stones  . PORTA CATH INSERTION N/A 04/24/2018   Procedure: PORTA CATH INSERTION;  Surgeon: Katha Cabal, MD;  Location: [place] [facility];  Service: Cardiovascular;  Laterality: N/A;  . ROBOTIC ASSISTED TOTAL HYSTERECTOMY Bilateral 04/04/2018   Procedure: ROBOTIC ASSISTED TOTAL HYSTERECTOMY,SENTINEL LYMPH NODE MAPPING AND BIOPSIES,AORTIC LYMPH NODE DISSECTION;  Surgeon: Gillis Ends, MD;  Location: [facility];  Service: Gynecology;  Laterality: Bilateral;  . TUBAL LIGATION  1980    SOCIAL HISTORY: Social History   Socioeconomic History  . Marital status: Single    Spouse name: Not on file  . Number of children: 1  . Years of education: Not on file  . Highest education level: Not on file  Occupational History  . 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 Financial resource strain: Not on file  . Food insecurity:    Worry: Not on file    Inability: Not on file  . Transportation needs:    Medical: Not on file    Non-medical: Not on file  Tobacco Use  . Smoking status: Current Every Day Smoker    Packs/day: 0.25    Types: Cigarettes  . Smokeless tobacco: Never Used  Substance and Sexual Activity  . Alcohol use: No    Alcohol/week: 0.0  standard drinks  . Drug use: No  . Sexual activity: Not Currently  Lifestyle  . Physical activity:    Days per week: Not on file    Minutes per session: Not on file  . Stress: Not on file  Relationships  . Social connections:    Talks on phone: Not on file    Gets together: Not on file    Attends religious service: Not on file    Active member of club or organization: Not on file    Attends meetings of clubs or organizations: Not on file    Relationship status: Not on file  . Intimate partner violence:    Fear of current or ex partner: Not on file    Emotionally abused: Not on file    Physically abused: Not on file    Forced sexual activity: Not on file  Other Topics Concern  . Not on file  Social History Narrative  . Not on file    FAMILY HISTORY: Family History  Problem Relation Age of Onset  . Breast cancer Mother   . Transient ischemic attack Mother   . Arthritis Mother   . Hypothyroidism Mother   . Lung cancer Mother   . Diabetes Father        type 2  . Hypertension Father   . Arthritis Father   . Colon cancer Sister   . Prostate cancer Brother   . Hyperlipidemia Son   . Stomach cancer Maternal Grandmother   . Stroke Maternal Grandfather     ALLERGIES:  has No Known Allergies.  MEDICATIONS:  Current Outpatient Medications  Medication Sig Dispense Refill  . acetaminophen (TYLENOL) 325 MG tablet Take 650 mg by mouth every 6 (six) hours as needed.    Marland Kitchen antiseptic oral rinse (BIOTENE) LIQD 15 mLs by Mouth Rinse route as needed for dry mouth. (Patient not taking: Reported on 08/08/2018) 1 Bottle 1  . betamethasone dipropionate (DIPROLENE) 0.05 % cream Apply topically 2 (two) times daily. As directed for psoriasis on body. (Patient taking differently: Apply 1 application topically 2 (two) times daily as needed (for psoriasis on body). ) 30 g 1  . desonide (DESOWEN) 0.05 % cream Apply once a day sparingly to seborrhea as needed on face. (Patient taking differently:  Apply 1 application topically 2 (two) times daily as needed (for psoriasis on face). ) 30 g 1  . dexamethasone (DECADRON) 4 MG tablet Take 2 tablets (8 mg total) by mouth daily. Start the day after chemotherapy for 2 days. (Patient not taking: Reported on 08/08/2018) 30 tablet 1  . gabapentin (NEURONTIN) 300 MG capsule TAKE 1 CAPSULE BY MOUTH THREE TIMES A DAY 270 capsule 1  . lidocaine-prilocaine (EMLA) cream Apply to affected area once (Patient not taking: Reported on 08/08/2018) 30 g 3  . magic mouthwash w/lidocaine SOLN Take 5 mLs by mouth 4 (four) times daily. 240 mL 0  . Multiple Vitamins-Minerals (CENTRUM SILVER ADULT 50+  PO) Take 1 tablet by mouth daily.    . ondansetron (ZOFRAN) 8 MG tablet Take 1 tablet (8 mg total) by mouth 2 (two) times daily as needed for refractory nausea / vomiting. (Patient not taking: Reported on 08/08/2018) 30 tablet 1  . potassium chloride (K-DUR) 10 MEQ tablet TAKE 1 TABLET BY MOUTH EVERY DAY 30 tablet 1  . prochlorperazine (COMPAZINE) 10 MG tablet Take 1 tablet (10 mg total) by mouth every 6 (six) hours as needed (Nausea or vomiting). (Patient not taking: Reported on 08/08/2018) 30 tablet 1  . ranitidine (ZANTAC) 150 MG tablet Take 150 mg by mouth daily.    Marland Kitchen triamcinolone (KENALOG) 0.025 % cream Apply 1 application topically 2 (two) times daily. 30 g 0  . oxyCODONE (ROXICODONE) 5 MG immediate release tablet Take 1 tablet (5 mg total) by mouth every 4 (four) hours as needed. (Patient not taking: Reported on 07/18/2018) 16 tablet 0  . traMADol (ULTRAM) 50 MG tablet Take 2 tablets (100 mg total) by mouth 3 (three) times daily as needed for moderate pain. (Patient not taking: Reported on 06/06/2018) 60 tablet 0   No current facility-administered medications for this visit.      PHYSICAL EXAMINATION: ECOG PERFORMANCE STATUS: 1 - Symptomatic but completely ambulatory Vitals:   08/08/18 0833  BP: 118/74  Pulse: 65  Resp: 18  Temp: (!) 97 F (36.1 C)   Filed  Weights   08/08/18 0833  Weight: 179 lb 8 oz (81.4 kg)    Physical Exam  Constitutional: She is oriented to person, place, and time. No distress.  HENT:  Head: Normocephalic and atraumatic.  Right Ear: External ear normal.  Mouth/Throat: Oropharynx is clear and moist.  Eyes: Pupils are equal, round, and reactive to light. Conjunctivae and EOM are normal. No scleral icterus.  Neck: Normal range of motion. Neck supple.  Cardiovascular: Normal rate, regular rhythm and normal heart sounds.  Pulmonary/Chest: Effort normal and breath sounds normal. No respiratory distress. She has no wheezes. She has no rales. She exhibits no tenderness.  Abdominal: Soft. Bowel sounds are normal. She exhibits no distension and no mass. There is no tenderness.  Musculoskeletal: Normal range of motion. She exhibits no edema or deformity.  Bilateral 1+ pitting edema  Lymphadenopathy:    She has no cervical adenopathy.  Neurological: She is alert and oriented to person, place, and time. She displays normal reflexes. No cranial nerve deficit. Coordination normal.  Skin: Skin is warm and dry. No rash noted. No erythema.  Psychiatric: She has a normal mood and affect.  Anxious     LABORATORY DATA:  I have reviewed the data as listed Lab Results  Component Value Date   WBC 4.5 08/08/2018   HGB 11.4 (L) 08/08/2018   HCT 33.5 (L) 08/08/2018   MCV 99.3 08/08/2018   PLT 188 08/08/2018   Recent Labs    06/27/18 0758 07/18/18 0759 08/08/18 0756  NA 138 141 143  K 3.4* 3.5 4.0  CL 106 109 109  CO2 25 25 25   GLUCOSE 128* 126* 93  BUN 15 15 16   CREATININE 0.70 0.66 0.74  CALCIUM 8.8* 8.9 9.3  GFRNONAA >60 >60 >60  GFRAA >60 >60 >60  PROT 6.7 6.5 6.9  ALBUMIN 3.7 3.7 3.8  AST 29 22 19   ALT 32 18 15  ALKPHOS 69 66 58  BILITOT 0.6 0.6 0.3   Iron/TIBC/Ferritin/ %Sat No results found for: IRON, TIBC, FERRITIN, IRONPCTSAT   RADIOGRAPHIC STUDIES: I have  personally reviewed the radiological images as  listed and agreed with the findings in the report. 07/11/2018 IMPRESSION: 1. Previously visualized pulmonary nodules are either stable or resolved. Several new subcentimeter bilateral pulmonary nodules. Pulmonary nodularity generally appears centrilobular in distribution and while considered indeterminate is more suggestive of infectious/inflammatory nodularity. Continued chest CT surveillance is recommended in 3-6 months. 2. No thoracic adenopathy.  ASSESSMENT & PLAN:  1. Endometrial cancer ([place])   2. Multiple lung nodules on CT   3. Encounter for antineoplastic chemotherapy   4. Neuropathy   5. Memory loss   Stage IA carcinosarcoma  Labs was reviewed and discussed with patient.  Counts acceptable to proceed with cycle 6 carboplatinum AUC 5 and Taxol 135 mg/m. Patient reports severe bone pain after Udenyca injection despite taking Claritin. Given that she is going to be started on vaginal brachii therapy soon, would hold Udenyca for this cycle. Weekly CBC x2.  #Neuropathy of bilateral feet, continue gabapentin 300 mg 3 times daily. #Sudden onset of memory loss, symptoms resolved spontaneously.  Discussed with patient that if additional episodes happens, she need to go to emergency room immediately for additional evaluation. Etiology of sudden memory loss not clear.  #Bilateral lower extremity swelling, improved. Discussed with patient that she will need to follow-up with radiation oncologist to start on vaginal brachii therapy. Discussed with GYN oncology nurse navigator [place].  Follow-up to be determined.  Will be rotated through her GYN oncology, [place] and me.  We spent sufficient time to discuss many aspect of care, questions were answered to patient's satisfaction. The patient knows to call the clinic with any problems questions or concerns.  Return of visit: To be determined. Total face to face encounter time for this patient visit was 45 min. >50% of the time was  spent in  counseling and coordination of care.   Earlie Server, MD, PhD Hematology Oncology [facility] at [facility] Pager- [phone] 08/08/2018

## 2018-08-09 ENCOUNTER — Other Ambulatory Visit: Payer: Self-pay

## 2018-08-09 ENCOUNTER — Encounter: Payer: Self-pay | Admitting: Radiation Oncology

## 2018-08-09 ENCOUNTER — Ambulatory Visit
Admission: RE | Admit: 2018-08-09 | Discharge: 2018-08-09 | Disposition: A | Discharge status: Home / Self Care | Payer: BLUE CROSS/BLUE SHIELD | Source: Ambulatory Visit | Attending: Radiation Oncology | Admitting: Radiation Oncology

## 2018-08-09 VITALS — BP 109/68 | HR 74 | Temp 98.2°F | Resp 18 | Wt 178.6 lb

## 2018-08-09 DIAGNOSIS — G629 Polyneuropathy, unspecified: Secondary | ICD-10-CM | POA: Insufficient documentation

## 2018-08-09 DIAGNOSIS — Z90710 Acquired absence of both cervix and uterus: Secondary | ICD-10-CM | POA: Insufficient documentation

## 2018-08-09 DIAGNOSIS — C541 Malignant neoplasm of endometrium: Secondary | ICD-10-CM | POA: Diagnosis present

## 2018-08-09 NOTE — Progress Notes (Signed)
Radiation Oncology Follow up Note  Name: Katherine Snyder   Date:   08/09/2018 MRN:  [id number] DOB: [birth year]    This 67 y.o. female presents to the clinic today for commencement of vaginal brachytherapy for patient with stage I carcinosarcoma of the uterus status post robotic-assisted hysterectomy and pelvic lymph node sampling and adjuvant chemotherapy.  REFERRING PROVIDER: Margo Common, PA  HPI: patient is a 67 year old female originally consult back in June 2019 when she presented with a stage I carcinosarcoma of the uterus status post robotic-assisted total hysterectomy and pelvic lymph node sampling.she did have less than half of thickness of myometrial invasion and there was no cervical stromal involvement all lymph nodes sampled both pelvic and paregoric were negative for metastatic disease. She has gone on to have chemotherapy completing 6 cycles of carbotaxol which she is tolerated fairly well. She does have mild peripheral neuropathy. She specifically denies vaginal discharge any increased lower urinary tract symptoms or diarrhea at this time.she recently had a CT scan showing previous visualized pulmonary nodules are either stable or resolved. The lesions were more suggestive of infectious inflammatory etiology. Continue chest surveillance was recommended.  COMPLICATIONS OF TREATMENT: none  FOLLOW UP COMPLIANCE: keeps appointments   PHYSICAL EXAM:  BP 109/68 (BP Location: Right Arm, Patient Position: Sitting)   Pulse 74   Temp 98.2 F (36.8 C) (Tympanic)   Resp 18   Wt 178 lb 9.2 oz (81 kg)   BMI 27.15 kg/m  Well-developed well-nourished patient in NAD. HEENT reveals PERLA, EOMI, discs not visualized.  Oral cavity is clear. No oral mucosal lesions are identified. Neck is clear without evidence of cervical or supraclavicular adenopathy. Lungs are clear to A&P. Cardiac examination is essentially unremarkable with regular rate and rhythm without murmur rub or thrill. Abdomen is  benign with no organomegaly or masses noted. Motor sensory and DTR levels are equal and symmetric in the upper and lower extremities. Cranial nerves II through XII are grossly intact. Proprioception is intact. No peripheral adenopathy or edema is identified. No motor or sensory levels are noted. Crude visual fields are within normal range.  RADIOLOGY RESULTS: CT scan is reviewed and compatible with the above-stated findings  PLAN: at this time like to go ahead with vaginal brachytherapy using high dose rate remote afterloading. Would plan on delivering 23.5 gray in 5 fractions. Risks and benefits of treatment including increased lower urinary tract symptoms diarrhea fatigue possible vaginal stenosis all were discussed in detail with the patient. I personally set up and ordered CT simulation for BrachyVision treatment planning in about a week and a half to allow some more recovery from her chemotherapy. Patient copy has my treatment plan well.  I would like to take this opportunity to thank you for allowing me to participate in the care of your patient.Noreene Filbert, MD

## 2018-08-10 ENCOUNTER — Ambulatory Visit: Payer: BLUE CROSS/BLUE SHIELD

## 2018-08-15 ENCOUNTER — Other Ambulatory Visit: Payer: Self-pay | Admitting: Oncology

## 2018-08-15 ENCOUNTER — Other Ambulatory Visit: Payer: Self-pay | Admitting: *Deleted

## 2018-08-15 ENCOUNTER — Inpatient Hospital Stay: Payer: BLUE CROSS/BLUE SHIELD

## 2018-08-15 DIAGNOSIS — R413 Other amnesia: Secondary | ICD-10-CM

## 2018-08-15 NOTE — Telephone Encounter (Signed)
)     Ref Range & Units 7d ago  Potassium 3.5 - 5.1 mmol/L 4.0

## 2018-08-22 ENCOUNTER — Inpatient Hospital Stay: Payer: BLUE CROSS/BLUE SHIELD

## 2018-08-22 DIAGNOSIS — Z5111 Encounter for antineoplastic chemotherapy: Secondary | ICD-10-CM | POA: Diagnosis not present

## 2018-08-22 DIAGNOSIS — C541 Malignant neoplasm of endometrium: Secondary | ICD-10-CM

## 2018-08-22 LAB — CBC WITH DIFFERENTIAL/PLATELET
Abs Immature Granulocytes: 0 10*3/uL (ref 0.00–0.07)
Basophils Absolute: 0 10*3/uL (ref 0.0–0.1)
Basophils Relative: 1 %
Eosinophils Absolute: 0 10*3/uL (ref 0.0–0.5)
Eosinophils Relative: 1 %
HCT: 32.5 % — ABNORMAL LOW (ref 36.0–46.0)
Hemoglobin: 10.6 g/dL — ABNORMAL LOW (ref 12.0–15.0)
Immature Granulocytes: 0 %
Lymphocytes Relative: 48 %
Lymphs Abs: 1.9 10*3/uL (ref 0.7–4.0)
MCH: 33 pg (ref 26.0–34.0)
MCHC: 32.6 g/dL (ref 30.0–36.0)
MCV: 101.2 fL — ABNORMAL HIGH (ref 80.0–100.0)
Monocytes Absolute: 0.5 10*3/uL (ref 0.1–1.0)
Monocytes Relative: 14 %
Neutro Abs: 1.4 10*3/uL — ABNORMAL LOW (ref 1.7–7.7)
Neutrophils Relative %: 36 %
Platelets: 140 10*3/uL — ABNORMAL LOW (ref 150–400)
RBC: 3.21 MIL/uL — ABNORMAL LOW (ref 3.87–5.11)
RDW: 14.6 % (ref 11.5–15.5)
WBC: 3.9 10*3/uL — ABNORMAL LOW (ref 4.0–10.5)
nRBC: 0 % (ref 0.0–0.2)

## 2018-08-22 LAB — COMPREHENSIVE METABOLIC PANEL
ALT: 25 U/L (ref 0–44)
AST: 22 U/L (ref 15–41)
Albumin: 3.9 g/dL (ref 3.5–5.0)
Alkaline Phosphatase: 56 U/L (ref 38–126)
Anion gap: 7 (ref 5–15)
BUN: 12 mg/dL (ref 8–23)
CO2: 27 mmol/L (ref 22–32)
Calcium: 9.3 mg/dL (ref 8.9–10.3)
Chloride: 105 mmol/L (ref 98–111)
Creatinine, Ser: 0.68 mg/dL (ref 0.44–1.00)
GFR calc Af Amer: >60 mL/min (ref >60)
GFR calc non Af Amer: >60 mL/min (ref >60)
Glucose, Bld: 93 mg/dL (ref 70–99)
Potassium: 4 mmol/L (ref 3.5–5.1)
Sodium: 139 mmol/L (ref 135–145)
Total Bilirubin: 0.4 mg/dL (ref 0.3–1.2)
Total Protein: 6.9 g/dL (ref 6.5–8.1)

## 2018-08-23 ENCOUNTER — Ambulatory Visit
Admission: RE | Admit: 2018-08-23 | Discharge: 2018-08-23 | Disposition: A | Discharge status: Home / Self Care | Payer: BLUE CROSS/BLUE SHIELD | Source: Ambulatory Visit | Attending: Radiation Oncology | Admitting: Radiation Oncology

## 2018-08-23 DIAGNOSIS — C541 Malignant neoplasm of endometrium: Secondary | ICD-10-CM | POA: Diagnosis not present

## 2018-08-28 DIAGNOSIS — C541 Malignant neoplasm of endometrium: Secondary | ICD-10-CM | POA: Diagnosis not present

## 2018-09-03 ENCOUNTER — Ambulatory Visit
Admission: RE | Admit: 2018-09-03 | Discharge: 2018-09-03 | Disposition: A | Discharge status: Home / Self Care | Payer: BLUE CROSS/BLUE SHIELD | Source: Ambulatory Visit | Attending: Radiation Oncology | Admitting: Radiation Oncology

## 2018-09-03 DIAGNOSIS — C541 Malignant neoplasm of endometrium: Secondary | ICD-10-CM | POA: Diagnosis not present

## 2018-09-05 ENCOUNTER — Ambulatory Visit
Admission: RE | Admit: 2018-09-05 | Discharge: 2018-09-05 | Disposition: A | Discharge status: Home / Self Care | Payer: BLUE CROSS/BLUE SHIELD | Source: Ambulatory Visit | Attending: Radiation Oncology | Admitting: Radiation Oncology

## 2018-09-05 DIAGNOSIS — C541 Malignant neoplasm of endometrium: Secondary | ICD-10-CM | POA: Diagnosis not present

## 2018-09-10 ENCOUNTER — Other Ambulatory Visit: Payer: Self-pay | Admitting: Oncology

## 2018-09-10 ENCOUNTER — Ambulatory Visit
Admission: RE | Admit: 2018-09-10 | Discharge: 2018-09-10 | Disposition: A | Discharge status: Home / Self Care | Payer: Medicare Other | Source: Ambulatory Visit | Attending: Radiation Oncology | Admitting: Radiation Oncology

## 2018-09-10 DIAGNOSIS — C541 Malignant neoplasm of endometrium: Secondary | ICD-10-CM | POA: Diagnosis not present

## 2018-09-10 NOTE — Telephone Encounter (Signed)
   Ref Range & Units 2 wk ago  Potassium 3.5 - 5.1 mmol/L 4.0         

## 2018-09-12 ENCOUNTER — Ambulatory Visit
Admission: RE | Admit: 2018-09-12 | Discharge: 2018-09-12 | Disposition: A | Discharge status: Home / Self Care | Payer: Medicare Other | Source: Ambulatory Visit | Attending: Radiation Oncology | Admitting: Radiation Oncology

## 2018-09-12 DIAGNOSIS — C541 Malignant neoplasm of endometrium: Secondary | ICD-10-CM | POA: Diagnosis not present

## 2018-09-14 ENCOUNTER — Ambulatory Visit: Admission: RE | Admit: 2018-09-14 | Payer: Medicare Other | Source: Ambulatory Visit

## 2018-09-17 ENCOUNTER — Ambulatory Visit
Admission: RE | Admit: 2018-09-17 | Discharge: 2018-09-17 | Disposition: A | Discharge status: Home / Self Care | Payer: Medicare Other | Source: Ambulatory Visit | Attending: Radiation Oncology | Admitting: Radiation Oncology

## 2018-09-17 ENCOUNTER — Ambulatory Visit: Payer: Medicare Other

## 2018-09-17 DIAGNOSIS — C541 Malignant neoplasm of endometrium: Secondary | ICD-10-CM | POA: Diagnosis not present

## 2018-10-03 ENCOUNTER — Inpatient Hospital Stay: Payer: Medicare Other | Attending: Oncology

## 2018-10-03 DIAGNOSIS — C541 Malignant neoplasm of endometrium: Secondary | ICD-10-CM | POA: Insufficient documentation

## 2018-10-03 DIAGNOSIS — Z95828 Presence of other vascular implants and grafts: Secondary | ICD-10-CM

## 2018-10-03 DIAGNOSIS — Z452 Encounter for adjustment and management of vascular access device: Secondary | ICD-10-CM | POA: Insufficient documentation

## 2018-10-03 MED ORDER — HEPARIN SOD (PORK) LOCK FLUSH 100 UNIT/ML IV SOLN
INTRAVENOUS | Status: AC
  Filled 2018-10-03: qty 5

## 2018-10-03 MED ORDER — HEPARIN SOD (PORK) LOCK FLUSH 100 UNIT/ML IV SOLN
500.0000 [IU] | Freq: Once | INTRAVENOUS | Status: AC
  Administered 2018-10-03: 500 [IU] via INTRAVENOUS

## 2018-10-03 MED ORDER — SODIUM CHLORIDE 0.9% FLUSH
10.0000 mL | Freq: Once | INTRAVENOUS | Status: AC
  Administered 2018-10-03: 10 mL via INTRAVENOUS
  Filled 2018-10-03: qty 10

## 2018-10-09 ENCOUNTER — Other Ambulatory Visit: Payer: Self-pay | Admitting: Oncology

## 2018-10-09 NOTE — Telephone Encounter (Signed)
)     Ref Range & Units 1 mo ago  Potassium 3.5 - 5.1 mmol/L 4.0         

## 2018-10-22 ENCOUNTER — Ambulatory Visit
Admission: RE | Admit: 2018-10-22 | Discharge: 2018-10-22 | Disposition: A | Discharge status: Home / Self Care | Payer: Medicare Other | Source: Ambulatory Visit | Attending: Radiation Oncology | Admitting: Radiation Oncology

## 2018-10-22 ENCOUNTER — Encounter: Payer: Self-pay | Admitting: Radiation Oncology

## 2018-10-22 ENCOUNTER — Other Ambulatory Visit: Payer: Self-pay

## 2018-10-22 ENCOUNTER — Inpatient Hospital Stay: Payer: BLUE CROSS/BLUE SHIELD | Attending: Radiation Oncology

## 2018-10-22 VITALS — BP 114/70 | HR 70 | Temp 95.5°F | Resp 18 | Wt 177.5 lb

## 2018-10-22 DIAGNOSIS — C541 Malignant neoplasm of endometrium: Secondary | ICD-10-CM

## 2018-10-22 NOTE — Progress Notes (Signed)
Radiation Oncology Follow up Note  Name: Katherine Snyder   Date:   10/22/2018 MRN:  [id number] DOB: 22-Feb-1951    This 67 y.o. female presents to the clinic today for one-month follow-up status post vaginal brachytherapy for patient stage I carcinosarcoma the uterus status post robotic-assisted hysterectomy and pelvic lymph node sampling and adjuvant chemotherapy.  REFERRING PROVIDER: Margo Common, PA  HPI: patient is a 67 year old female now seen out 1 month having completed vaginal brachytherapy status post robotic-assisted hysterectomy pelvic lymph node sampling for carcinosarcoma of the uterus stage I. She undergone 6 cycles of carbotaxol..she is now 1 month out having completed vaginal brachytherapy for stage I carcinosarcoma. She is doing well she specifically denies vaginal bleeding any increased lower urinary tract symptoms or diarrhea. She tends towards constipation.  COMPLICATIONS OF TREATMENT: none  FOLLOW UP COMPLIANCE: keeps appointments   PHYSICAL EXAM:  BP 114/70 (BP Location: Right Arm, Patient Position: Sitting)   Pulse 70   Temp (!) 95.5 F (35.3 C) (Tympanic)   Resp 18   Wt 177 lb 7.5 oz (80.5 kg)   BMI 26.98 kg/m  On speculum examination vaginal vault is clear vaginal apex is unremarkable. No evidence of mass or nodularity in the vaginal vault is noted.Well-developed well-nourished patient in NAD. HEENT reveals PERLA, EOMI, discs not visualized.  Oral cavity is clear. No oral mucosal lesions are identified. Neck is clear without evidence of cervical or supraclavicular adenopathy. Lungs are clear to A&P. Cardiac examination is essentially unremarkable with regular rate and rhythm without murmur rub or thrill. Abdomen is benign with no organomegaly or masses noted. Motor sensory and DTR levels are equal and symmetric in the upper and lower extremities. Cranial nerves II through XII are grossly intact. Proprioception is intact. No peripheral adenopathy or edema is  identified. No motor or sensory levels are noted. Crude visual fields are within normal range.  RADIOLOGY RESULTS: no current films for review  PLAN: at the present time patient is doing well with no evidence of disease recovering nicely from her brachytherapy. I'm please were overall progress. I've asked to see her back in 4-5 months. She saw rescheduled for imaging in February I've asked her to forward those results to me for my review. Patient is to call with any concerns.  I would like to take this opportunity to thank you for allowing me to participate in the care of your patient.Noreene Filbert, MD

## 2018-10-22 NOTE — Progress Notes (Unsigned)
Survivorship Care Plan visit completed.  Treatment summary reviewed and given to patient.  ASCO answers booklet reviewed and given to patient.  CARE program and Cancer Transitions discussed with patient along with other resources cancer center offers to patients and caregivers.  Patient verbalized understanding.    

## 2018-11-20 DIAGNOSIS — J069 Acute upper respiratory infection, unspecified: Secondary | ICD-10-CM | POA: Diagnosis not present

## 2018-11-28 ENCOUNTER — Inpatient Hospital Stay: Payer: Medicare Other | Attending: Oncology

## 2018-11-28 DIAGNOSIS — C541 Malignant neoplasm of endometrium: Secondary | ICD-10-CM | POA: Diagnosis present

## 2018-11-28 DIAGNOSIS — Z452 Encounter for adjustment and management of vascular access device: Secondary | ICD-10-CM | POA: Diagnosis not present

## 2018-11-28 DIAGNOSIS — Z95828 Presence of other vascular implants and grafts: Secondary | ICD-10-CM

## 2018-11-28 MED ORDER — SODIUM CHLORIDE 0.9% FLUSH
10.0000 mL | INTRAVENOUS | Status: DC | PRN
  Administered 2018-11-28: 10 mL via INTRAVENOUS
  Filled 2018-11-28: qty 10

## 2018-11-28 MED ORDER — HEPARIN SOD (PORK) LOCK FLUSH 100 UNIT/ML IV SOLN
500.0000 [IU] | Freq: Once | INTRAVENOUS | Status: AC
  Administered 2018-11-28: 500 [IU] via INTRAVENOUS
  Filled 2018-11-28: qty 5

## 2018-12-04 ENCOUNTER — Ambulatory Visit (INDEPENDENT_AMBULATORY_CARE_PROVIDER_SITE_OTHER): Payer: Medicare Other | Admitting: Physician Assistant

## 2018-12-04 ENCOUNTER — Ambulatory Visit
Admission: RE | Admit: 2018-12-04 | Discharge: 2018-12-04 | Disposition: A | Discharge status: Home / Self Care | Payer: Medicare Other | Attending: Physician Assistant | Admitting: Physician Assistant

## 2018-12-04 ENCOUNTER — Ambulatory Visit
Admission: RE | Admit: 2018-12-04 | Discharge: 2018-12-04 | Disposition: A | Discharge status: Home / Self Care | Payer: Medicare Other | Source: Ambulatory Visit | Attending: Physician Assistant | Admitting: Physician Assistant

## 2018-12-04 ENCOUNTER — Encounter: Payer: Self-pay | Admitting: Physician Assistant

## 2018-12-04 VITALS — BP 121/76 | HR 92 | Temp 97.9°F | Wt 174.2 lb

## 2018-12-04 DIAGNOSIS — R5383 Other fatigue: Secondary | ICD-10-CM | POA: Insufficient documentation

## 2018-12-04 DIAGNOSIS — R05 Cough: Secondary | ICD-10-CM | POA: Diagnosis not present

## 2018-12-04 DIAGNOSIS — R059 Cough, unspecified: Secondary | ICD-10-CM

## 2018-12-04 DIAGNOSIS — R071 Chest pain on breathing: Secondary | ICD-10-CM | POA: Insufficient documentation

## 2018-12-04 DIAGNOSIS — C541 Malignant neoplasm of endometrium: Secondary | ICD-10-CM | POA: Insufficient documentation

## 2018-12-04 MED ORDER — PROMETHAZINE-DM 6.25-15 MG/5ML PO SYRP: 5.0000 mL | ORAL_SOLUTION | Freq: Three times a day (TID) | ORAL | 0 refills | Status: DC | PRN

## 2018-12-04 NOTE — Progress Notes (Signed)
Patient: Katherine Snyder Female    DOB: 02-Nov-1951   68 y.o.   MRN: [id number] Visit Date: 12/05/2018  Today's Provider: Trinna Post, PA-C   Chief Complaint  Patient presents with  . URI   Subjective:   Patient with a history of endometrial cancer treated with chemo and radiation in September and October of last year presenting today with cough, congestion, and fatigue ongoing for several weeks. She was seen at an urgent care ~2 weeks ago and was treated with Augmentin 875-125 mg BID x 10 days. She has completed this and does not feel much better. She reports feeling very fatigues and that her throat and chest are very raw from coughing. She denies fever, productive cough.   URI   This is a recurrent problem. The current episode started 1 to 4 weeks ago. The problem has been unchanged. There has been no fever. Associated symptoms include congestion, coughing, a sore throat and wheezing.   Wt Readings from Last 3 Encounters:  12/04/18 174 lb 3.2 oz (79 kg)  10/22/18 177 lb 7.5 oz (80.5 kg)  08/09/18 178 lb 9.2 oz (81 kg)     No Known Allergies   Current Outpatient Medications:  .  acetaminophen (TYLENOL) 325 MG tablet, Take 650 mg by mouth every 6 (six) hours as needed., Disp: , Rfl:  .  betamethasone dipropionate (DIPROLENE) 0.05 % cream, Apply topically 2 (two) times daily. As directed for psoriasis on body. (Patient taking differently: Apply 1 application topically 2 (two) times daily as needed (for psoriasis on body). ), Disp: 30 g, Rfl: 1 .  desonide (DESOWEN) 0.05 % cream, Apply once a day sparingly to seborrhea as needed on face. (Patient taking differently: Apply 1 application topically 2 (two) times daily as needed (for psoriasis on face). ), Disp: 30 g, Rfl: 1 .  dexamethasone (DECADRON) 4 MG tablet, Take 2 tablets (8 mg total) by mouth daily. Start the day after chemotherapy for 2 days., Disp: 30 tablet, Rfl: 1 .  gabapentin (NEURONTIN) 300 MG capsule, TAKE 1  CAPSULE BY MOUTH THREE TIMES A DAY, Disp: 270 capsule, Rfl: 1 .  lidocaine-prilocaine (EMLA) cream, Apply to affected area once, Disp: 30 g, Rfl: 3 .  Multiple Vitamins-Minerals (CENTRUM SILVER ADULT 50+ PO), Take 1 tablet by mouth daily., Disp: , Rfl:  .  potassium chloride (K-DUR) 10 MEQ tablet, TAKE 1 TABLET BY MOUTH EVERY DAY, Disp: 30 tablet, Rfl: 0 .  potassium chloride (K-DUR) 10 MEQ tablet, TAKE 1 TABLET BY MOUTH EVERY DAY, Disp: 30 tablet, Rfl: 1 .  ranitidine (ZANTAC) 150 MG tablet, Take 150 mg by mouth daily., Disp: , Rfl:  .  triamcinolone (KENALOG) 0.025 % cream, Apply 1 application topically 2 (two) times daily., Disp: 30 g, Rfl: 0 .  antiseptic oral rinse (BIOTENE) LIQD, 15 mLs by Mouth Rinse route as needed for dry mouth. (Patient not taking: Reported on 08/08/2018), Disp: 1 Bottle, Rfl: 1 .  magic mouthwash w/lidocaine SOLN, Take 5 mLs by mouth 4 (four) times daily. (Patient not taking: Reported on 10/22/2018), Disp: 240 mL, Rfl: 0 .  ondansetron (ZOFRAN) 8 MG tablet, Take 1 tablet (8 mg total) by mouth 2 (two) times daily as needed for refractory nausea / vomiting. (Patient not taking: Reported on 08/08/2018), Disp: 30 tablet, Rfl: 1 .  oxyCODONE (ROXICODONE) 5 MG immediate release tablet, Take 1 tablet (5 mg total) by mouth every 4 (four) hours as needed. (Patient not  taking: Reported on 07/18/2018), Disp: 16 tablet, Rfl: 0 .  prochlorperazine (COMPAZINE) 10 MG tablet, Take 1 tablet (10 mg total) by mouth every 6 (six) hours as needed (Nausea or vomiting). (Patient not taking: Reported on 08/08/2018), Disp: 30 tablet, Rfl: 1 .  promethazine-dextromethorphan (PROMETHAZINE-DM) 6.25-15 MG/5ML syrup, Take 5 mLs by mouth 3 (three) times daily as needed for cough., Disp: 118 mL, Rfl: 0 .  traMADol (ULTRAM) 50 MG tablet, Take 2 tablets (100 mg total) by mouth 3 (three) times daily as needed for moderate pain. (Patient not taking: Reported on 06/06/2018), Disp: 60 tablet, Rfl: 0  Review of  Systems  Constitutional: Positive for fatigue.  HENT: Positive for congestion, postnasal drip and sore throat.   Respiratory: Positive for cough, shortness of breath and wheezing.   Neurological: Positive for weakness.    Social History   Tobacco Use  . Smoking status: Current Every Day Smoker    Packs/day: 0.25    Types: Cigarettes  . Smokeless tobacco: Never Used  Substance Use Topics  . Alcohol use: No    Alcohol/week: 0.0 standard drinks      Objective:   BP 121/76 (BP Location: Left Arm, Patient Position: Sitting, Cuff Size: Normal)   Pulse 92   Temp 97.9 F (36.6 C) (Oral)   Wt 174 lb 3.2 oz (79 kg)   SpO2 97%   BMI 26.49 kg/m  Vitals:   12/04/18 1142  BP: 121/76  Pulse: 92  Temp: 97.9 F (36.6 C)  TempSrc: Oral  SpO2: 97%  Weight: 174 lb 3.2 oz (79 kg)     Physical Exam Constitutional:      Appearance: Normal appearance. She is not ill-appearing.  HENT:     Right Ear: Tympanic membrane and ear canal normal.     Left Ear: Tympanic membrane and ear canal normal.     Nose: Nose normal.     Mouth/Throat:     Mouth: Mucous membranes are moist.     Pharynx: Oropharynx is clear.  Neck:     Musculoskeletal: Normal range of motion and neck supple.  Cardiovascular:     Rate and Rhythm: Normal rate and regular rhythm.     Heart sounds: Normal heart sounds.  Pulmonary:     Effort: Pulmonary effort is normal.     Breath sounds: Normal breath sounds. No wheezing or rhonchi.  Lymphadenopathy:     Cervical: No cervical adenopathy.  Skin:    General: Skin is warm and dry.  Neurological:     Mental Status: She is alert and oriented to person, place, and time. Mental status is at baseline.  Psychiatric:        Mood and Affect: Mood normal.        Behavior: Behavior normal.         Assessment & Plan    1. Fatigue, unspecified type  She is afebrile and though tired, does not appear toxic in clinic today. She has had appropriate antibiotic treatment for  sinusitis. Her lungs sound well on exam today with good airflow and no adverse sounds. Her oxygenation is at 97%. As she is under current treatment for endometrial cancer with chemotherapy and radiation I will get labs as below and CXR to rule out pneumonia.  I have personally reviewed CXR and agree with the findings that it is normal and does not show pneumonia. She does not have a white blood cell count or anemia on labwork, electrolytes, kidney function and liver function are  normal. I think fatigue is likely acute on chronic with recent URI and oncology treatment.   - Comprehensive Metabolic Panel (CMET) - CBC with Differential - DG Chest 2 View; Future  2. Cough  Cough medication provided. She is experiencing some throat pain and soreness from coughing.   - DG Chest 2 View; Future - promethazine-dextromethorphan (PROMETHAZINE-DM) 6.25-15 MG/5ML syrup; Take 5 mLs by mouth 3 (three) times daily as needed for cough.  Dispense: 118 mL; Refill: 0  3. Endometrial cancer [facility])  Continue follow up with oncology. She is due for repeat CT chest on Monday 12/11/2018 for surveillance of disease and pulmonary nodules.   - DG Chest 2 View; Future  The entirety of the information documented in the History of Present Illness, Review of Systems and Physical Exam were personally obtained by me. Portions of this information were initially documented by Lynford Humphrey, CMA and reviewed by me for thoroughness and accuracy.   Return if symptoms worsen or fail to improve.     Trinna Post, PA-C  [facility]

## 2018-12-05 ENCOUNTER — Telehealth: Payer: Self-pay

## 2018-12-05 LAB — COMPREHENSIVE METABOLIC PANEL
ALT: 12 IU/L (ref 0–32)
AST: 15 IU/L (ref 0–40)
Albumin/Globulin Ratio: 1.5 (ref 1.2–2.2)
Albumin: 4.2 g/dL (ref 3.8–4.8)
Alkaline Phosphatase: 77 IU/L (ref 39–117)
BUN/Creatinine Ratio: 20 (ref 12–28)
BUN: 16 mg/dL (ref 8–27)
Bilirubin Total: 0.3 mg/dL (ref 0.0–1.2)
CO2: 22 mmol/L (ref 20–29)
Calcium: 9.8 mg/dL (ref 8.7–10.3)
Chloride: 102 mmol/L (ref 96–106)
Creatinine, Ser: 0.79 mg/dL (ref 0.57–1.00)
GFR calc Af Amer: 91 mL/min/{1.73_m2} (ref >59)
GFR calc non Af Amer: 79 mL/min/{1.73_m2} (ref >59)
Globulin, Total: 2.8 g/dL (ref 1.5–4.5)
Glucose: 94 mg/dL (ref 65–99)
Potassium: 4 mmol/L (ref 3.5–5.2)
Sodium: 140 mmol/L (ref 134–144)
Total Protein: 7 g/dL (ref 6.0–8.5)

## 2018-12-05 LAB — CBC WITH DIFFERENTIAL/PLATELET
Basophils Absolute: 0.1 10*3/uL (ref 0.0–0.2)
Basos: 1 %
EOS (ABSOLUTE): 0.1 10*3/uL (ref 0.0–0.4)
Eos: 1 %
Hematocrit: 37.6 % (ref 34.0–46.6)
Hemoglobin: 12.9 g/dL (ref 11.1–15.9)
Immature Grans (Abs): 0 10*3/uL (ref 0.0–0.1)
Immature Granulocytes: 0 %
Lymphocytes Absolute: 2 10*3/uL (ref 0.7–3.1)
Lymphs: 24 %
MCH: 31.3 pg (ref 26.6–33.0)
MCHC: 34.3 g/dL (ref 31.5–35.7)
MCV: 91 fL (ref 79–97)
Monocytes Absolute: 0.6 10*3/uL (ref 0.1–0.9)
Monocytes: 8 %
Neutrophils Absolute: 5.4 10*3/uL (ref 1.4–7.0)
Neutrophils: 66 %
Platelets: 260 10*3/uL (ref 150–450)
RBC: 4.12 x10E6/uL (ref 3.77–5.28)
RDW: 11.8 % (ref 11.7–15.4)
WBC: 8.1 10*3/uL (ref 3.4–10.8)

## 2018-12-05 NOTE — Telephone Encounter (Signed)
Patient advised as below.  

## 2018-12-05 NOTE — Patient Instructions (Signed)

## 2018-12-05 NOTE — Telephone Encounter (Signed)
-----   Message from Trinna Post, [place] sent at 12/05/2018  8:31 AM EST ----- Her CXR is normal, does not show pneumonia. We are waiting on her bloodwork to come back.

## 2018-12-05 NOTE — Telephone Encounter (Signed)
-----   Message from Trinna Post, [place] sent at 12/05/2018  9:32 AM EST ----- Blood work looks good. White count in normal range, no anemia. I think she is fatigued as a combination from being sick from a respiratory illness and her recent treatment with chemotherapy. It may take some time to feel back to herself.

## 2018-12-10 ENCOUNTER — Ambulatory Visit: Payer: BLUE CROSS/BLUE SHIELD

## 2018-12-10 ENCOUNTER — Ambulatory Visit
Admission: RE | Admit: 2018-12-10 | Discharge: 2018-12-10 | Disposition: A | Discharge status: Home / Self Care | Payer: Medicare Other | Source: Ambulatory Visit | Attending: Nurse Practitioner | Admitting: Nurse Practitioner

## 2018-12-10 DIAGNOSIS — J984 Other disorders of lung: Secondary | ICD-10-CM | POA: Diagnosis not present

## 2018-12-10 DIAGNOSIS — R918 Other nonspecific abnormal finding of lung field: Secondary | ICD-10-CM

## 2018-12-10 DIAGNOSIS — C541 Malignant neoplasm of endometrium: Secondary | ICD-10-CM | POA: Diagnosis not present

## 2018-12-12 ENCOUNTER — Other Ambulatory Visit: Payer: Self-pay

## 2018-12-12 ENCOUNTER — Inpatient Hospital Stay: Payer: Medicare Other | Attending: Obstetrics and Gynecology | Admitting: Obstetrics and Gynecology

## 2018-12-12 VITALS — BP 99/65 | HR 70 | Temp 96.3°F | Resp 18 | Wt 174.4 lb

## 2018-12-12 DIAGNOSIS — C541 Malignant neoplasm of endometrium: Secondary | ICD-10-CM

## 2018-12-12 DIAGNOSIS — Z923 Personal history of irradiation: Secondary | ICD-10-CM

## 2018-12-12 DIAGNOSIS — R918 Other nonspecific abnormal finding of lung field: Secondary | ICD-10-CM

## 2018-12-12 DIAGNOSIS — Z9221 Personal history of antineoplastic chemotherapy: Secondary | ICD-10-CM | POA: Diagnosis not present

## 2018-12-12 DIAGNOSIS — Z90710 Acquired absence of both cervix and uterus: Secondary | ICD-10-CM | POA: Diagnosis not present

## 2018-12-12 DIAGNOSIS — Z90722 Acquired absence of ovaries, bilateral: Secondary | ICD-10-CM | POA: Insufficient documentation

## 2018-12-12 DIAGNOSIS — F17210 Nicotine dependence, cigarettes, uncomplicated: Secondary | ICD-10-CM

## 2018-12-12 NOTE — Progress Notes (Signed)
Here for follow up "Im feeling fine " per pt. No voice c/o

## 2018-12-12 NOTE — Progress Notes (Signed)
Gynecologic Oncology Interval Visit   Referring Provider: Dr. Amalia Hailey  Chief Complaint: Endometrial Cancer  Subjective:  Katherine Snyder is a 68 y.o. 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  She has received her 6th cycle of carbo-taxol on 08/08/2018.  She received vaginal brachytherapy with Dr. Baruch Gouty 10/19-11/09/2018.   No complaints today. See CT scan results below that were done to follow up indeterminate pulmonary nodules. She is a current smoker.    12/10/2018- CT Chest wo contrast - New sub solid density measuring 12 mm with associated 3 mm solid nodule is noted in left upper lobe. There also noted several other new nodules noted more posteriorly in the left upper lobe, with the largest measuring 3 mm. These may simply be inflammatory in etiology, but neoplasm can not be excluded. Initial follow-up by chest CT without contrast is recommended in 3 months to confirm persistence. - Stable sub solid density is noted in right upper lobe with grossly stable 4 mm solid nodule. Multiple other pulmonary nodules are noted in both lungs which are stable compared to prior exam - Small nonobstructive right renal calculus - Stable left thyroid nodule - Stable hepatic cyst  GYNECOLOGIC ONCOLOGY HISTORY:  Initially, patient presented to ER on 02/26/18 for PMB. She was reportedly 14 years post menopausal and had had a 2 day episode of vaginal bleeding that was constant.   02/26/18- Ultrasound Pelvic Complete w/ Transvaginal: Endometrium thickness up to 45mm. No focal solid or cystic change. IMPRESSION: Mildly enlarged uterus. No discrete fibroids. Markedly thickened endometrium. In the setting of post-menopausal bleeding, endometrial sampling is indicated to exclude carcinoma. If results are benign, sonohysterogram should be considered  for focal lesion work-up. (Ref: Radiological Reasoning: Algorithmic Workup of Abnormal Vaginal Bleeding with Endovaginal Sonography and Sonohysterography. AJR 2008; 557:D22-02) Nonvisualization of the right ovary. Normal-appearing left ovary. No significant uterine fibroids were observed.  She was seen by Dr. Ellard Artis son 03/01/18 who performed vacurette endometrial biopsy. Uterus was sounded to length to 9 cm. She was started on Aygestin and bleeding stopped.   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  COMMENT: BASED ON INITIAL HISTOLOGIC FINDINGS, IMMUNOHISTOCHEMISTRY IS EVALUATED:  THE SARCOMATOUS TUMOR COMPONENT IS DIFFUSELY POSITIVE FOR CD10 AND THE CARCINOMA COMPONENT IS POSITIVE FOR PANKERATIN, SUPPORTING THE DIAGNOSIS.   She was seen by Dr. Fransisca Connors, Gyn-Onc on 03/28/2018 for initial evaluation and treatment planning.  TLH BSO with sentinel lymph node mapping and biopsies and aortic lymph node dissection were discussed.  Surgery at [facility] was recommended but unfortunately, patient's insurance will not cover.   CA 125- 10.5  03/30/18 - CT C/A/P W CONTRAST IMPRESSION: 1. Mass like expansion of the endometrial cavity known primary endometrial carcinoma. 2. No evidence for nodal metastasis or solid organ metastasis within the abdomen or pelvis. 3. Scattered nonspecific solid nodules and a single part solid nodule identified in both lungs. Consensus criteria recommendations for nodule followup does not apply to patients with known primary malignancy. 4. Liver cysts.  She underwent CT Imaging and then robot assisted total hysterectomy with sentinel LN mapping, biopsies, and aortic LN dissection with Dr. Theora Gianotti at [facility] 04/04/18.   A. UTERUS WITH CERVIX; HYSTERECTOMY:  - CARCINOSARCOMA.  - MYOMETRIAL INVASION PRESENT, LESS THAN HALF OF THE MYOMETRIUM.  - NEGATIVE  FOR CERVICAL  AND SEROSAL INVOLVEMENT.  - SUBMUCOSAL LEIOMYOMA.   FALLOPIAN TUBE AND OVARY, LEFT; SALPINGO-OOPHORECTOMY:  - NEGATIVE FOR MALIGNANCY.  - OVARIAN CORTICAL INCLUSION CYSTS.  - ATROPHIC FALLOPIAN TUBE.   B. SENTINEL LYMPH NODE, RIGHT PRIMARY OBTURATOR; EXCISION:  - NEGATIVE FOR MALIGNANCY, ONE LYMPH NODE (0/1).   C. LYMPH NODE, RIGHT EXTERNAL ILIAC VEIN; EXCISION:  - NEGATIVE FOR MALIGNANCY, ONE LYMPH NODE (0/1).   D. FALLOPIAN TUBE AND OVARY, UNILATERAL (RIGHT); SALPINGO-OOPHORECTOMY:  - NEGATIVE FOR MALIGNANCY.  - OVARIAN CORTICAL INCLUSION CYSTS.  - ATROPHIC FALLOPIAN TUBE.   E. SENTINEL LYMPH NODE, RIGHT LOW PARA-AORTIC; EXCISION:  - NEGATIVE FOR MALIGNANCY, TWO LYMPH NODES (0/2).   F. SENTINEL LYMPH NODE, LEFT PRIMARY LOW PARA-AORTIC; EXCISION:  - NEGATIVE FOR MALIGNANCY, ONE LYMPH NODE (0/1).   DIAGNOSIS:  A. PELVIC WASHINGS:  - NEGATIVE FOR MALIGNANCY.     Her case was discussed at [place] and discussed that recurrence risk without adjuvant treatment is up to 50%. So recommend adjuvant chemo and radiation.  She was seen by Dr. Baruch Gouty, for initial evaluation of stage IA uterine carcinosarcoma and plan is for brachytherapy after 6 cycles of carbo/taxol.   She initiated carbo-Taxol on 04/25/2018.  Completed 6 cycles on 08/08/2018.  07/11/18- CT Chest WO Contrast for follow-up on pulmonary nodules IMPRESSION: 1. Previously visualized pulmonary nodules are either stable or resolved. Several new subcentimeter bilateral pulmonary nodules. Pulmonary nodularity generally appears centrilobular in distribution and while considered indeterminate is more suggestive of infectious/inflammatory nodularity. Continued chest CT surveillance is recommended in 3-6 months. 2. No thoracic adenopathy.  Problem List: Patient Active Problem List   Diagnosis Date Noted  . Neuropathy 05/16/2018  . Fatigue 05/16/2018  . Inflammatory heel pain, right 05/16/2018  . Encounter for  antineoplastic chemotherapy 05/16/2018  . Drug-induced neutropenia ([place]) 05/09/2018  . Goals of care, counseling/discussion 04/18/2018  . Endometrial cancer ([place])   . Seborrhea 05/13/2016  . History of kidney stones 06/26/2015  . OP (osteoporosis) 06/26/2015  . Plantar fasciitis 06/26/2015  . Current tobacco use 06/26/2015  . Chronic airway obstruction ([place]) 10/08/2009  . Psoriasis 09/30/2008  . 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 Menopausal symptom 09/21/2007  . Tobacco use 09/21/2007  . Awareness of heartbeats 09/21/2007    Past Medical History: Past Medical History:  Diagnosis Date  . Arthritis   . Cancer [facility])    endometrial  . Endometrial mass   . History of chicken pox   . History of kidney stones   . History of measles   . History of mumps   . Osteoporosis   . PMB (postmenopausal bleeding)   . Psoriasis     Past Surgical History: Past Surgical History:  Procedure Laterality Date  . APPENDECTOMY  1960  . BREAST SURGERY Left    biopsy  . Injection varicose veins in legs Bilateral 2010   Dr. Hulda Humphrey  . LITHOTRIPSY  1994, 2004   for renal stones  . PORTA CATH INSERTION N/A 04/24/2018   Procedure: PORTA CATH INSERTION;  Surgeon: Katha Cabal, MD;  [facility];  Service: Cardiovascular;  Laterality: N/A;  . ROBOTIC ASSISTED TOTAL HYSTERECTOMY Bilateral 04/04/2018   Procedure: ROBOTIC ASSISTED TOTAL HYSTERECTOMY,SENTINEL LYMPH NODE MAPPING AND BIOPSIES,AORTIC LYMPH NODE DISSECTION;  Surgeon: Gillis Ends, MD;  Location: [facility] ORS;  Service: Gynecology;  Laterality: Bilateral;  . TUBAL LIGATION  1980    OB History:  OB History  Gravida Para Term Preterm  AB Living  1 1 1     1   SAB TAB Ectopic Multiple Live Births          1    # Outcome Date GA Lbr Len/2nd Weight Sex Delivery Anes PTL Lv  1 Term 43    M Vag-Spont   LIV  G1P1001 LMP:   Family History: Family History  Problem Relation Age of Onset   . Breast cancer Mother   . Transient ischemic attack Mother   . Arthritis Mother   . Hypothyroidism Mother   . Lung cancer Mother   . Diabetes Father        type 2  . Hypertension Father   . Arthritis Father   . Colon cancer Sister   . Prostate cancer Brother   . Hyperlipidemia Son   . Stomach cancer Maternal Grandmother   . Stroke Maternal Grandfather     Social History: Social History   Socioeconomic History  . Marital status: Single    Spouse name: Not on file  . Number of children: 1  . Years of education: Not on file  . Highest education level: Not on file  Occupational History  . Occupation: Employed    Comment: Works at a [employer] doing [employer]  Social Needs  . Financial resource strain: Not on file  . Food insecurity:    Worry: Not on file    Inability: Not on file  . Transportation needs:    Medical: Not on file    Non-medical: Not on file  Tobacco Use  . Smoking status: Current Every Day Smoker    Packs/day: 0.25    Types: Cigarettes  . Smokeless tobacco: Never Used  Substance and Sexual Activity  . Alcohol use: No    Alcohol/week: 0.0 standard drinks  . Drug use: No  . Sexual activity: Not Currently  Lifestyle  . Physical activity:    Days per week: Not on file    Minutes per session: Not on file  . Stress: Not on file  Relationships  . Social connections:    Talks on phone: Not on file    Gets together: Not on file    Attends religious service: Not on file    Active member of club or organization: Not on file    Attends meetings of clubs or organizations: Not on file    Relationship status: Not on file  . Intimate partner violence:    Fear of current or ex partner: Not on file    Emotionally abused: Not on file    Physically abused: Not on file    Forced sexual activity: Not on file  Other Topics Concern  . Not on file  Social History Narrative  . Not on file    Allergies: No Known Allergies  Current Medications: Current  Outpatient Medications  Medication Sig Dispense Refill  . acetaminophen (TYLENOL) 325 MG tablet Take 650 mg by mouth every 6 (six) hours as needed.    Marland Kitchen antiseptic oral rinse (BIOTENE) LIQD 15 mLs by Mouth Rinse route as needed for dry mouth. 1 Bottle 1  . betamethasone dipropionate (DIPROLENE) 0.05 % cream Apply topically 2 (two) times daily. As directed for psoriasis on body. (Patient taking differently: Apply 1 application topically 2 (two) times daily as needed (for psoriasis on body). ) 30 g 1  . desonide (DESOWEN) 0.05 % cream Apply once a day sparingly to seborrhea as needed on face. (Patient taking differently: Apply 1 application topically 2 (  two) times daily as needed (for psoriasis on face). ) 30 g 1  . gabapentin (NEURONTIN) 300 MG capsule TAKE 1 CAPSULE BY MOUTH THREE TIMES A DAY 270 capsule 1  . lidocaine-prilocaine (EMLA) cream Apply to affected area once 30 g 3  . magic mouthwash w/lidocaine SOLN Take 5 mLs by mouth 4 (four) times daily. 240 mL 0  . Multiple Vitamins-Minerals (CENTRUM SILVER ADULT 50+ PO) Take 1 tablet by mouth daily.    . ondansetron (ZOFRAN) 8 MG tablet Take 1 tablet (8 mg total) by mouth 2 (two) times daily as needed for refractory nausea / vomiting. 30 tablet 1  . oxyCODONE (ROXICODONE) 5 MG immediate release tablet Take 1 tablet (5 mg total) by mouth every 4 (four) hours as needed. 16 tablet 0  . potassium chloride (K-DUR) 10 MEQ tablet TAKE 1 TABLET BY MOUTH EVERY DAY 30 tablet 0  . prochlorperazine (COMPAZINE) 10 MG tablet Take 1 tablet (10 mg total) by mouth every 6 (six) hours as needed (Nausea or vomiting). 30 tablet 1  . promethazine-dextromethorphan (PROMETHAZINE-DM) 6.25-15 MG/5ML syrup Take 5 mLs by mouth 3 (three) times daily as needed for cough. 118 mL 0  . ranitidine (ZANTAC) 150 MG tablet Take 150 mg by mouth daily.    . traMADol (ULTRAM) 50 MG tablet Take 2 tablets (100 mg total) by mouth 3 (three) times daily as needed for moderate pain. 60 tablet  0  . triamcinolone (KENALOG) 0.025 % cream Apply 1 application topically 2 (two) times daily. 30 g 0   No current facility-administered medications for this visit.     Review of Systems General:  no complaints Skin: no complaints Eyes: no complaints HEENT: no complaints Breasts: no complaints Pulmonary: no complaints Cardiac: no complaints Gastrointestinal: no complaints Genitourinary/Sexual: no complaints Ob/Gyn: no complaints Musculoskeletal: no complaints Hematology: no complaints Neurologic/Psych: no complaints  Objective:  Physical Examination:  Vitals:   12/12/18 1439  BP: 99/65  Pulse: 70  Resp: 18  Temp: (!) 96.3 F (35.7 C)      ECOG Performance Status: 0 - Asymptomatic  GENERAL: Patient is a well appearing female in no acute distress HEENT:  Sclera clear. Anicteric. Dentures in place.  NODES:  Negative axillary, supraclavicular, inguinal lymph node survery LUNGS:  Clear to auscultation bilaterally.  HEART:  Regular rate and rhythm.  ABDOMEN:  Soft, nontender.  No hernias, incisions well healed. No masses or ascites EXTREMITIES:  No peripheral edema. Atraumatic. No cyanosis SKIN:  Clear with no obvious rashes or skin changes.  NEURO:  Nonfocal. Well oriented.  Appropriate affect.  PELVIC: EGBUS: normal, Vagina: atropic with no lesions.  Bimanual/RV: normal, no masses.    Assessment:  Katherine Snyder is a 68 y.o. female diagnosed with stage IA uterine carcinosarcoma with RA TLH/BSO, negative staging 5/19 followed by vaginal brachytherapy and 6 cycles of carbo/taxol chemotherapy.  NED on exam today.    Chest CTs 5/19, 9/19 and this week have  shown indeterminate lung nodules, some disapearing over time and some new with one that is now 1.2 cm in LUL.  She is a current smoker.      Comorbidities complicating care: smoker, plantar fasciitis  Plan:   Problem List Items Addressed This Visit      Genitourinary   Endometrial cancer [facility]) - Primary      Patient will  be seeing see Dr Baruch Gouty in 3 months for final visit with him.    Discussed the serial CT scans with Dr  Tasia Catchings today and we decide to have these reviewed at the multiD conference next week to determine whether a biopsy is indicated at this point, as opposed to just doing another surveillance chest CT scan in three months.  With the waxing and waning nature of these nodules they may be inflammatory due to her smoking, but need to consider possibility of metastatic uterine cancer or new lung primary.    Will also schedule her to see Dr Tasia Catchings in 3 months.  We will see her back in 4-6 months.      Mellody Drown, MD  CC:  [place], [place] [address] [place], [place] [address] [phone]

## 2018-12-20 ENCOUNTER — Inpatient Hospital Stay: Payer: Medicare Other

## 2018-12-20 NOTE — Progress Notes (Signed)
Tumor Board Documentation  Katherine Snyder was presented by Dr Tasia Catchings at our Tumor Board on 12/20/2018, which included representatives from medical oncology, radiation oncology, surgical oncology, surgical, radiology, pathology, navigation, internal medicine, research.  Katherine Snyder currently presents as a current patient with history of the following treatments: neoadjuvant chemotherapy.  Additionally, we reviewed previous medical and familial history, history of present illness, and recent lab results along with all available histopathologic and imaging studies. The tumor board considered available treatment options and made the following recommendations: Active surveillance Rescan in 3 vs 6 months  The following procedures/referrals were also placed: No orders of the defined types were placed in this encounter.   Clinical Trial Status: not discussed   Staging used: AJCC Stage Group AJCC Staging: T: pT1a N: pN0   Group: FIGO 1A  National site-specific guidelines NCCN were discussed with respect to the case.  Tumor board is a meeting of clinicians from various specialty areas who evaluate and discuss patients for whom a multidisciplinary approach is being considered. Final determinations in the plan of care are those of the provider(s). The responsibility for follow up of recommendations given during tumor board is that of the provider.   Today's extended care, comprehensive team conference, Katherine Snyder was not present for the discussion and was not examined.   Multidisciplinary Tumor Board is a multidisciplinary case peer review process.  Decisions discussed in the Multidisciplinary Tumor Board reflect the opinions of the specialists present at the conference without having examined the patient.  Ultimately, treatment and diagnostic decisions rest with the primary provider(s) and the patient.

## 2019-01-24 NOTE — Progress Notes (Signed)
Patient: Katherine Snyder, Female    DOB: 1951-06-25, 68 y.o.   MRN: [id number] Visit Date: 01/28/2019  Today's Provider: [name], PA   Chief Complaint  Patient presents with  . Medicare Wellness   Subjective:    Initial preventative physical exam Katherine Snyder is a 68 y.o. female who presents today for her Initial Preventative Physical Exam. She feels well. She reports exercising none. She reports she is sleeping poorly.  Past Medical History:  Diagnosis Date  . Arthritis   . Cancer [facility])    endometrial  . Endometrial mass   . History of chicken pox   . History of kidney stones   . History of measles   . History of mumps   . Osteoporosis   . PMB (postmenopausal bleeding)   . Psoriasis    Past Surgical History:  Procedure Laterality Date  . APPENDECTOMY  1960  . BREAST SURGERY Left    biopsy  . Injection varicose veins in legs Bilateral 2010   Dr. Hulda Humphrey  . LITHOTRIPSY  1994, 2004   for renal stones  . PORTA CATH INSERTION N/A 04/24/2018   Procedure: PORTA CATH INSERTION;  Surgeon: Katha Cabal, MD;  Location: [facility];  Service: Cardiovascular;  Laterality: N/A;  . ROBOTIC ASSISTED TOTAL HYSTERECTOMY Bilateral 04/04/2018   Procedure: ROBOTIC ASSISTED TOTAL HYSTERECTOMY,SENTINEL LYMPH NODE MAPPING AND BIOPSIES,AORTIC LYMPH NODE DISSECTION;  Surgeon: Gillis Ends, MD;  Location: [facility];  Service: Gynecology;  Laterality: Bilateral;  . TUBAL LIGATION  1980   Family History  Problem Relation Age of Onset  . Breast cancer Mother   . Transient ischemic attack Mother   . Arthritis Mother   . Hypothyroidism Mother   . Lung cancer Mother   . Diabetes Father        type 2  . Hypertension Father   . Arthritis Father   . Colon cancer Sister   . Prostate cancer Brother   . Hyperlipidemia Son   . Stomach cancer Maternal Grandmother   . Stroke Maternal Grandfather    Review of Systems  All other systems reviewed and are  negative.  Social History   Socioeconomic History  . Marital status: Single    Spouse name: Not on file  . Number of children: 1  . Years of education: Not on file  . Highest education level: Not on file  Occupational History  . Occupation: Employed    Comment: Works at a [employer] doing [employer]  Social Needs  . Financial resource strain: Not on file  . Food insecurity:    Worry: Not on file    Inability: Not on file  . Transportation needs:    Medical: Not on file    Non-medical: Not on file  Tobacco Use  . Smoking status: Current Every Day Smoker    Packs/day: 0.25    Types: Cigarettes  . Smokeless tobacco: Never Used  Substance and Sexual Activity  . Alcohol use: No    Alcohol/week: 0.0 standard drinks  . Drug use: No  . Sexual activity: Not Currently  Lifestyle  . Physical activity:    Days per week: Not on file    Minutes per session: Not on file  . Stress: Not on file  Relationships  . Social connections:    Talks on phone: Not on file    Gets together: Not on file    Attends religious service: Not on file  Active member of club or organization: Not on file    Attends meetings of clubs or organizations: Not on file    Relationship status: Not on file  . Intimate partner violence:    Fear of current or ex partner: Not on file    Emotionally abused: Not on file    Physically abused: Not on file    Forced sexual activity: Not on file  Other Topics Concern  . Not on file  Social History Narrative  . Not on file   Past Medical History:  Diagnosis Date  . Arthritis   . Cancer [facility])    endometrial  . Endometrial mass   . History of chicken pox   . History of kidney stones   . History of measles   . History of mumps   . Osteoporosis   . PMB (postmenopausal bleeding)   . Psoriasis     Patient Active Problem List   Diagnosis Date Noted  . Neuropathy 05/16/2018  . Fatigue 05/16/2018  . Inflammatory heel pain, right 05/16/2018  . Encounter for  antineoplastic chemotherapy 05/16/2018  . Drug-induced neutropenia ([place]) 05/09/2018  . Goals of care, counseling/discussion 04/18/2018  . Endometrial cancer ([place])   . Seborrhea 05/13/2016  . History of kidney stones 06/26/2015  . OP (osteoporosis) 06/26/2015  . Plantar fasciitis 06/26/2015  . Current tobacco use 06/26/2015  . Chronic airway obstruction ([place]) 10/08/2009  . Psoriasis 09/30/2008  . 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 Menopausal symptom 09/21/2007  . Tobacco use 09/21/2007  . Awareness of heartbeats 09/21/2007   Past Surgical History:  Procedure Laterality Date  . APPENDECTOMY  1960  . BREAST SURGERY Left    biopsy  . Injection varicose veins in legs Bilateral 2010   Dr. Hulda Humphrey  . LITHOTRIPSY  1994, 2004   for renal stones  . PORTA CATH INSERTION N/A 04/24/2018   Procedure: PORTA CATH INSERTION;  Surgeon: Katha Cabal, MD;  Location: [facility];  Service: Cardiovascular;  Laterality: N/A;  . ROBOTIC ASSISTED TOTAL HYSTERECTOMY Bilateral 04/04/2018   Procedure: ROBOTIC ASSISTED TOTAL HYSTERECTOMY,SENTINEL LYMPH NODE MAPPING AND BIOPSIES,AORTIC LYMPH NODE DISSECTION;  Surgeon: Gillis Ends, MD;  Location: [facility];  Service: Gynecology;  Laterality: Bilateral;  . TUBAL LIGATION  1980   Her family history includes Arthritis in her father and mother; Breast cancer in her mother; Colon cancer in her sister; Diabetes in her father; Hyperlipidemia in her son; Hypertension in her father; Hypothyroidism in her mother; Lung cancer in her mother; Prostate cancer in her brother; Stomach cancer in her maternal grandmother; Stroke in her maternal grandfather; Transient ischemic attack in her mother.   Current Outpatient Medications:  .  betamethasone dipropionate (DIPROLENE) 0.05 % cream, Apply topically 2 (two) times daily. As directed for psoriasis on body. (Patient taking differently: Apply 1 application topically 2 (two) times  daily as needed (for psoriasis on body). ), Disp: 30 g, Rfl: 1 .  desonide (DESOWEN) 0.05 % cream, Apply once a day sparingly to seborrhea as needed on face. (Patient taking differently: Apply 1 application topically 2 (two) times daily as needed (for psoriasis on face). ), Disp: 30 g, Rfl: 1 .  lidocaine-prilocaine (EMLA) cream, Apply to affected area once, Disp: 30 g, Rfl: 3 .  Multiple Vitamins-Minerals (CENTRUM SILVER ADULT 50+ PO), Take 1 tablet by mouth daily., Disp: , Rfl:  .  naproxen sodium (ALEVE) 220 MG tablet, Take 220 mg by mouth daily as needed., Disp: ,  Rfl:  .  acetaminophen (TYLENOL) 325 MG tablet, Take 650 mg by mouth every 6 (six) hours as needed., Disp: , Rfl:  .  antiseptic oral rinse (BIOTENE) LIQD, 15 mLs by Mouth Rinse route as needed for dry mouth. (Patient not taking: Reported on 12/12/2018), Disp: 1 Bottle, Rfl: 1 .  gabapentin (NEURONTIN) 300 MG capsule, TAKE 1 CAPSULE BY MOUTH THREE TIMES A DAY (Patient not taking: Reported on 12/12/2018), Disp: 270 capsule, Rfl: 1 .  magic mouthwash w/lidocaine SOLN, Take 5 mLs by mouth 4 (four) times daily. (Patient not taking: Reported on 12/12/2018), Disp: 240 mL, Rfl: 0 .  ondansetron (ZOFRAN) 8 MG tablet, Take 1 tablet (8 mg total) by mouth 2 (two) times daily as needed for refractory nausea / vomiting. (Patient not taking: Reported on 12/12/2018), Disp: 30 tablet, Rfl: 1 .  oxyCODONE (ROXICODONE) 5 MG immediate release tablet, Take 1 tablet (5 mg total) by mouth every 4 (four) hours as needed. (Patient not taking: Reported on 12/12/2018), Disp: 16 tablet, Rfl: 0 .  potassium chloride (K-DUR) 10 MEQ tablet, TAKE 1 TABLET BY MOUTH EVERY DAY (Patient not taking: Reported on 12/12/2018), Disp: 30 tablet, Rfl: 0 .  prochlorperazine (COMPAZINE) 10 MG tablet, Take 1 tablet (10 mg total) by mouth every 6 (six) hours as needed (Nausea or vomiting). (Patient not taking: Reported on 12/12/2018), Disp: 30 tablet, Rfl: 1 .  promethazine-dextromethorphan  (PROMETHAZINE-DM) 6.25-15 MG/5ML syrup, Take 5 mLs by mouth 3 (three) times daily as needed for cough. (Patient not taking: Reported on 12/12/2018), Disp: 118 mL, Rfl: 0 .  ranitidine (ZANTAC) 150 MG tablet, Take 150 mg by mouth daily., Disp: , Rfl:  .  traMADol (ULTRAM) 50 MG tablet, Take 2 tablets (100 mg total) by mouth 3 (three) times daily as needed for moderate pain. (Patient not taking: Reported on 01/28/2019), Disp: 60 tablet, Rfl: 0 .  triamcinolone (KENALOG) 0.025 % cream, Apply 1 application topically 2 (two) times daily. (Patient not taking: Reported on 12/12/2018), Disp: 30 g, Rfl: 0   Patient Care Team: Cotey Rakes, Vickki Muff, PA as PCP - General (Family Medicine) Mellody Drown, MD as Referring Physician (Obstetrics and Gynecology) Gillis Ends, MD as Referring Physician (Obstetrics and Gynecology) Clent Jacks, RN as Registered Nurse Noreene Filbert, MD as Referring Physician (Radiation Oncology) Earlie Server, MD as Consulting Physician (Oncology) Schnier, Dolores Lory, MD (Vascular Surgery)   Objective:    Vitals: BP 112/68 (BP Location: Right Arm, Patient Position: Sitting, Cuff Size: Large)   Pulse (!) 58   Temp 98 F (36.7 C) (Oral)   Resp 16   Ht 5\' 8"  (1.727 m)   Wt 177 lb (80.3 kg)   SpO2 96%   BMI 26.91 kg/m   Physical Exam Constitutional:      Appearance: Normal appearance. She is well-developed.  HENT:     Head: Normocephalic and atraumatic.     Right Ear: Tympanic membrane and external ear normal.     Left Ear: Tympanic membrane and external ear normal.     Nose: Nose normal.     Mouth/Throat:     Pharynx: Oropharynx is clear.  Eyes:     General:        Right eye: No discharge.     Conjunctiva/sclera: Conjunctivae normal.     Pupils: Pupils are equal, round, and reactive to light.  Neck:     Musculoskeletal: Normal range of motion and neck supple.     Thyroid: No thyromegaly.  Trachea: No tracheal deviation.  Cardiovascular:     Rate and  Rhythm: Normal rate and regular rhythm.     Pulses: Normal pulses.     Heart sounds: Normal heart sounds. No murmur.  Pulmonary:     Effort: Pulmonary effort is normal. No respiratory distress.     Breath sounds: Normal breath sounds. No wheezing or rales.     Comments: Right upper chest scar with subcutaneous portal cath in place. No breast masses, dimpling, local lymphadenopathy or nipple discharge. Chest:     Chest wall: No tenderness.  Abdominal:     General: Bowel sounds are normal. There is no distension.     Palpations: Abdomen is soft. There is no mass.     Tenderness: There is no abdominal tenderness. There is no guarding or rebound.  Genitourinary:    Comments: History of total hysterectomy for endometrial cancer on 04-04-18. Musculoskeletal: Normal range of motion.        General: No tenderness.  Lymphadenopathy:     Cervical: No cervical adenopathy.  Skin:    General: Skin is warm and dry.     Findings: Rash present. No erythema.     Comments: Mild erythematous rash on cheeks.  Neurological:     Mental Status: She is alert and oriented to person, place, and time.     Cranial Nerves: No cranial nerve deficit.     Motor: No abnormal muscle tone.     Coordination: Coordination normal.     Deep Tendon Reflexes: Reflexes are normal and symmetric. Reflexes normal.  Psychiatric:        Behavior: Behavior normal.        Thought Content: Thought content normal.        Judgment: Judgment normal.     Visual Acuity Screening   Right eye Left eye Both eyes  Without correction:     With correction: 20/25 20/40 20/20     Activities of Daily Living In your present state of health, do you have any difficulty performing the following activities: 04/03/2018  Hearing? N  Vision? N  Difficulty concentrating or making decisions? N  Walking or climbing stairs? N  Dressing or bathing? N  Doing errands, shopping? Y  Some recent data might be hidden   Fall Risk Assessment Fall Risk   01/28/2019  Falls in the past year? 0    Depression Screen PHQ 2/9 Scores 01/28/2019 05/13/2016  PHQ - 2 Score 0 0  PHQ- 9 Score 0 -   Cognitive Testing - 6-CIT  Correct? Score   What year is it? yes 0 0 or 4  What month is it? yes 0 0 or 3  Memorize:    Pia Mau,  44,  [address],  [place],      What time is it? (within 1 hour) yes 0 0 or 3  Count backwards from 20 yes 0 0, 2, or 4  Name the months of the year yes 0 0, 2, or 4  Repeat name & address above yes 4 0, 2, 4, 6, 8, or 10       TOTAL SCORE  4/28   Interpretation:  Normal  Normal (0-7) Abnormal (8-28)      Assessment & Plan:     Initial Preventative Physical Exam  Reviewed patient's Family Medical History Reviewed and updated list of patient's medical providers Assessment of cognitive impairment was done Assessed patient's functional ability Established a written schedule for health screening [place]  Completed and Reviewed  Exercise Activities and Dietary recommendations Goals   Encouraged to exercise regularly for 30-40 minutes 3-4 days a week, drink 6 eight ounce glasses of water daily and follow a regular low fat diet.     Immunization History  Administered Date(s) Administered  . Influenza-Unspecified 08/16/2016  . Pneumococcal Polysaccharide-23 10/08/2009  . Tdap 10/08/2009    Health Maintenance  Topic Date Due  . HIV Screening  09/14/1968  . COLONOSCOPY  09/15/2003  . INFLUENZA VACCINE  06/07/2018  . MAMMOGRAM  08/17/2018  . PNA vac Low Risk Adult (1 of 2 - PCV13) 09/14/2018  . PAP SMEAR-Modifier  05/14/2019  . TETANUS/TDAP  10/09/2019  . DEXA SCAN  Completed  . Hepatitis C Screening  Completed    Discussed health benefits of physical activity, and encouraged her to engage in regular exercise appropriate for her age and condition.    ------------------------------------------------------------------------------------------------------------  1. Welcome to  Medicare preventive visit Good general health status post chemotherapy and radiation for endometrial cancer in 2019. EKG shows some bradycardia but no acute changes. Last labs on 12-04-18 were essentially normal. Scheduled for follow up with Dr. Tasia Catchings (oncologist) and Dr. Baruch Gouty (radiation oncologist) in May 2020. States she will keep the porta-cath for 5 years, Will check with radiology regarding any precautions for getting mammograms with porta-cath still in place. Recheck annually. Recent Results (from the past 2160 hour(s))  Comprehensive Metabolic Panel (CMET)     Status: None   Collection Time: 12/04/18 12:17 PM  Result Value Ref Range   Glucose 94 65 - 99 mg/dL   BUN 16 8 - 27 mg/dL   Creatinine, Ser 0.79 0.57 - 1.00 mg/dL   GFR calc non Af Amer 79 >59 mL/min/1.73   GFR calc Af Amer 91 >59 mL/min/1.73   BUN/Creatinine Ratio 20 12 - 28   Sodium 140 134 - 144 mmol/L   Potassium 4.0 3.5 - 5.2 mmol/L   Chloride 102 96 - 106 mmol/L   CO2 22 20 - 29 mmol/L   Calcium 9.8 8.7 - 10.3 mg/dL   Total Protein 7.0 6.0 - 8.5 g/dL   Albumin 4.2 3.8 - 4.8 g/dL    Comment:               **Please note reference interval change**   Globulin, Total 2.8 1.5 - 4.5 g/dL   Albumin/Globulin Ratio 1.5 1.2 - 2.2   Bilirubin Total 0.3 0.0 - 1.2 mg/dL   Alkaline Phosphatase 77 39 - 117 IU/L   AST 15 0 - 40 IU/L   ALT 12 0 - 32 IU/L  CBC with Differential     Status: None   Collection Time: 12/04/18 12:17 PM  Result Value Ref Range   WBC 8.1 3.4 - 10.8 x10E3/uL   RBC 4.12 3.77 - 5.28 x10E6/uL   Hemoglobin 12.9 11.1 - 15.9 g/dL   Hematocrit 37.6 34.0 - 46.6 %   MCV 91 79 - 97 fL   MCH 31.3 26.6 - 33.0 pg   MCHC 34.3 31.5 - 35.7 g/dL   RDW 11.8 11.7 - 15.4 %   Platelets 260 150 - 450 x10E3/uL   Neutrophils 66 Not Estab. %   Lymphs 24 Not Estab. %   Monocytes 8 Not Estab. %   Eos 1 Not Estab. %   Basos 1 Not Estab. %   Neutrophils Absolute 5.4 1.4 - 7.0 x10E3/uL   Lymphocytes Absolute 2.0 0.7 - 3.1  x10E3/uL   Monocytes Absolute 0.6  0.1 - 0.9 x10E3/uL   EOS (ABSOLUTE) 0.1 0.0 - 0.4 x10E3/uL   Basophils Absolute 0.1 0.0 - 0.2 x10E3/uL   Immature Granulocytes 0 Not Estab. %   Immature Grans (Abs) 0.0 0.0 - 0.1 x10E3/uL   - EKG 12-Lead  2. BMI 26.0-26.9,adult Since she has finished her endometrial cancer treatments, recommend she work on weight control and regular exercise.  3. Need for Td vaccine - Td vaccine greater than or equal to 7yo preservative free IM  4. Need for pneumococcal vaccination - Pneumococcal conjugate vaccine 13-valent IM  5. 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 Had 6 cycles of Cargo-Taxol followed by vaginal brachytherapy she finished in November 2019. No vaginal irritation, discharge or bladder discomfort.  6. Psoriasis Well controlled by Desowen and Diprolene prn. Only mild rash on face today. Refill medications and may need follow up with dermatologist if any changes or worsening. - desonide (DESOWEN) 0.05 % cream; Apply 1 application topically 2 (two) times daily as needed (for psoriasis on face).  Dispense: 30 g; Refill: 1 - betamethasone dipropionate (DIPROLENE) 0.05 % cream; Apply 1 application topically 2 (two) times daily as needed (for psoriasis on body).  Dispense: 30 g; Refill: [place], [place]  [facility]

## 2019-01-28 ENCOUNTER — Ambulatory Visit (INDEPENDENT_AMBULATORY_CARE_PROVIDER_SITE_OTHER): Payer: Medicare Other | Admitting: Family Medicine

## 2019-01-28 ENCOUNTER — Other Ambulatory Visit: Payer: Self-pay

## 2019-01-28 ENCOUNTER — Encounter: Payer: Self-pay | Admitting: Family Medicine

## 2019-01-28 VITALS — BP 112/68 | HR 58 | Temp 98.0°F | Resp 16 | Ht 68.0 in | Wt 177.0 lb

## 2019-01-28 DIAGNOSIS — C541 Malignant neoplasm of endometrium: Secondary | ICD-10-CM

## 2019-01-28 DIAGNOSIS — Z23 Encounter for immunization: Secondary | ICD-10-CM | POA: Diagnosis not present

## 2019-01-28 DIAGNOSIS — Z6826 Body mass index (BMI) 26.0-26.9, adult: Secondary | ICD-10-CM

## 2019-01-28 DIAGNOSIS — L409 Psoriasis, unspecified: Secondary | ICD-10-CM | POA: Diagnosis not present

## 2019-01-28 DIAGNOSIS — Z0000 Encounter for general adult medical examination without abnormal findings: Secondary | ICD-10-CM | POA: Diagnosis not present

## 2019-01-28 MED ORDER — DESONIDE 0.05 % EX CREA: 1.0000 "application " | TOPICAL_CREAM | Freq: Two times a day (BID) | CUTANEOUS | 1 refills | Status: DC | PRN

## 2019-01-28 MED ORDER — BETAMETHASONE DIPROPIONATE 0.05 % EX CREA: 1.0000 "application " | TOPICAL_CREAM | Freq: Two times a day (BID) | CUTANEOUS | 1 refills | Status: DC | PRN

## 2019-01-29 ENCOUNTER — Telehealth: Payer: Medicare Other | Admitting: Family Medicine

## 2019-02-25 ENCOUNTER — Other Ambulatory Visit: Payer: Self-pay

## 2019-02-25 ENCOUNTER — Inpatient Hospital Stay: Payer: Medicare Other | Attending: Oncology

## 2019-02-25 DIAGNOSIS — C541 Malignant neoplasm of endometrium: Secondary | ICD-10-CM | POA: Diagnosis present

## 2019-02-25 DIAGNOSIS — Z452 Encounter for adjustment and management of vascular access device: Secondary | ICD-10-CM | POA: Diagnosis not present

## 2019-02-25 DIAGNOSIS — Z95828 Presence of other vascular implants and grafts: Secondary | ICD-10-CM

## 2019-02-25 MED ORDER — SODIUM CHLORIDE 0.9% FLUSH
10.0000 mL | Freq: Once | INTRAVENOUS | Status: AC
  Administered 2019-02-25: 10 mL via INTRAVENOUS
  Filled 2019-02-25: qty 10

## 2019-02-25 MED ORDER — HEPARIN SOD (PORK) LOCK FLUSH 100 UNIT/ML IV SOLN
500.0000 [IU] | Freq: Once | INTRAVENOUS | Status: AC
  Administered 2019-02-25: 500 [IU] via INTRAVENOUS

## 2019-03-06 ENCOUNTER — Encounter: Payer: Self-pay | Admitting: *Deleted

## 2019-03-12 ENCOUNTER — Other Ambulatory Visit: Payer: Self-pay

## 2019-03-12 ENCOUNTER — Ambulatory Visit
Admission: RE | Admit: 2019-03-12 | Discharge: 2019-03-12 | Disposition: A | Discharge status: Home / Self Care | Payer: Medicare Other | Source: Ambulatory Visit | Attending: Nurse Practitioner | Admitting: Nurse Practitioner

## 2019-03-12 DIAGNOSIS — R918 Other nonspecific abnormal finding of lung field: Secondary | ICD-10-CM | POA: Insufficient documentation

## 2019-03-12 DIAGNOSIS — J8489 Other specified interstitial pulmonary diseases: Secondary | ICD-10-CM | POA: Diagnosis not present

## 2019-03-13 ENCOUNTER — Other Ambulatory Visit: Payer: Self-pay

## 2019-03-13 ENCOUNTER — Inpatient Hospital Stay: Payer: Medicare Other | Attending: Oncology | Admitting: Oncology

## 2019-03-13 ENCOUNTER — Encounter: Payer: Self-pay | Admitting: Oncology

## 2019-03-13 DIAGNOSIS — R911 Solitary pulmonary nodule: Secondary | ICD-10-CM

## 2019-03-13 DIAGNOSIS — Z791 Long term (current) use of non-steroidal anti-inflammatories (NSAID): Secondary | ICD-10-CM

## 2019-03-13 DIAGNOSIS — Z79899 Other long term (current) drug therapy: Secondary | ICD-10-CM

## 2019-03-13 DIAGNOSIS — C55 Malignant neoplasm of uterus, part unspecified: Secondary | ICD-10-CM | POA: Diagnosis not present

## 2019-03-13 DIAGNOSIS — G629 Polyneuropathy, unspecified: Secondary | ICD-10-CM

## 2019-03-13 DIAGNOSIS — F17210 Nicotine dependence, cigarettes, uncomplicated: Secondary | ICD-10-CM

## 2019-03-13 DIAGNOSIS — Z95828 Presence of other vascular implants and grafts: Secondary | ICD-10-CM | POA: Diagnosis not present

## 2019-03-13 NOTE — Progress Notes (Signed)
Called patient for Telehealth visit via Katherine Snyder.  Patient c/o shoulder pain, discussed with PCP.

## 2019-03-14 NOTE — Progress Notes (Signed)
HEMATOLOGY-ONCOLOGY TeleHEALTH VISIT PROGRESS NOTE  I connected with Katherine Snyder on 03/14/19 at 10:00 AM EDT by video enabled telemedicine visit and verified that I am speaking with the correct person using two identifiers. I discussed the limitations, risks, security and privacy concerns of performing an evaluation and management service by telemedicine and the availability of in-person appointments. I also discussed with the patient that there may be a patient responsible charge related to this service. The patient expressed understanding and agreed to proceed.   Other persons participating in the visit and their role in the encounter:  Geraldine Solar, [place], check in patient   Janeann Merl, RN, check in patient.   Patient's location: Home  Provider's location: Work Risk analyst Complaint: Follow up for Stage I uterous carcinosarcoma, lung nodule, discussion of CT results.    INTERVAL HISTORY Katherine Snyder is a 68 y.o. female who has above history reviewed by me today presents for follow up visit for management of uterus carcinosarcoma, lung nodule, and discussion of CT results.  Problems and complaints are listed below:  Patient reports feeling well at baseline. No new complaints.  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 Status post adjuvant vaginal brachi therapy. Patient was seen by GYN oncology on 12/12/2018.  12/10/2018- CT Chest wo contrast - New sub solid density measuring 12 mm with associated 3 mm solid nodule is noted in left upper lobe.Stable sub solid density is noted in right upper lobe with grossly stable 4 mm solid nodule. Multiple other pulmonary nodules are noted in both lungs which are stable compared to prior exam  Case was discussed on tumor board and recommend surveillance CT in 3 months.  Patient had follow-up CT chest done  and present for discussion of CT results.  Reports having mild numbness tingling of her fingertips and toes.  Neuropathy secondary to chemotherapy.  She used to take gabapentin and has run out of the prescription.  She feels neuropathy is manageable and prefers not to be restarted on gabapentin.  No concern of her port. Reports that she has quitted smoking in February 2020. Review of Systems  Constitutional: Positive for fatigue. Negative for appetite change, chills and fever.  HENT:   Negative for hearing loss and voice change.   Eyes: Negative for eye problems.  Respiratory: Negative for chest tightness and cough.   Cardiovascular: Negative for chest pain.  Gastrointestinal: Negative for abdominal distention, abdominal pain and blood in stool.  Endocrine: Negative for hot flashes.  Genitourinary: Negative for difficulty urinating and frequency.   Musculoskeletal: Negative for arthralgias.  Skin: Negative for itching and rash.  Neurological: Negative for extremity weakness.  Hematological: Negative for adenopathy.  Psychiatric/Behavioral: Negative for confusion.    Past Medical History:  Diagnosis Date  . Arthritis   . Cancer [facility])    endometrial  . Endometrial mass   . History of chicken pox   . History of kidney stones   . History of measles   . History of mumps   . Osteoporosis   . PMB (postmenopausal bleeding)   . Psoriasis    Past Surgical History:  Procedure Laterality Date  . APPENDECTOMY  1960  . BREAST SURGERY Left    biopsy  . Injection varicose veins in legs Bilateral 2010   Dr. Hulda Humphrey  . LITHOTRIPSY  1994, 2004   for renal stones  .  PORTA CATH INSERTION N/A 04/24/2018   Procedure: PORTA CATH INSERTION;  Surgeon: Katha Cabal, MD;  Location: [facility];  Service: Cardiovascular;  Laterality: N/A;  . ROBOTIC ASSISTED TOTAL HYSTERECTOMY Bilateral 04/04/2018   Procedure: ROBOTIC ASSISTED TOTAL HYSTERECTOMY,SENTINEL LYMPH NODE MAPPING AND  BIOPSIES,AORTIC LYMPH NODE DISSECTION;  Surgeon: Gillis Ends, MD;  Location: [facility];  Service: Gynecology;  Laterality: Bilateral;  . TUBAL LIGATION  1980    Family History  Problem Relation Age of Onset  . Breast cancer Mother   . Transient ischemic attack Mother   . Arthritis Mother   . Hypothyroidism Mother   . Lung cancer Mother   . Diabetes Father        type 2  . Hypertension Father   . Arthritis Father   . Colon cancer Sister   . Prostate cancer Brother   . Hyperlipidemia Son   . Stomach cancer Maternal Grandmother   . Stroke Maternal Grandfather     Social History   Socioeconomic History  . Marital status: Single    Spouse name: Not on file  . Number of children: 1  . Years of education: Not on file  . Highest education level: Not on file  Occupational History  . Occupation: Employed    Comment: Works at a [employer] doing [employer]  Social Needs  . Financial resource strain: Not on file  . Food insecurity:    Worry: Not on file    Inability: Not on file  . Transportation needs:    Medical: Not on file    Non-medical: Not on file  Tobacco Use  . Smoking status: Current Every Day Smoker    Packs/day: 0.25    Types: Cigarettes  . Smokeless tobacco: Never Used  Substance and Sexual Activity  . Alcohol use: No    Alcohol/week: 0.0 standard drinks  . Drug use: No  . Sexual activity: Not Currently  Lifestyle  . Physical activity:    Days per week: Not on file    Minutes per session: Not on file  . Stress: Not on file  Relationships  . Social connections:    Talks on phone: Not on file    Gets together: Not on file    Attends religious service: Not on file    Active member of club or organization: Not on file    Attends meetings of clubs or organizations: Not on file    Relationship status: Not on file  . Intimate partner violence:    Fear of current or ex partner: Not on file    Emotionally abused: Not on file    Physically abused: Not on  file    Forced sexual activity: Not on file  Other Topics Concern  . Not on file  Social History Narrative  . Not on file    Current Outpatient Medications on File Prior to Visit  Medication Sig Dispense Refill  . acetaminophen (TYLENOL) 325 MG tablet Take 650 mg by mouth every 6 (six) hours as needed.    . betamethasone dipropionate (DIPROLENE) 0.05 % cream Apply 1 application topically 2 (two) times daily as needed (for psoriasis on body). 30 g 1  . desonide (DESOWEN) 0.05 % cream Apply 1 application topically 2 (two) times daily as needed (for psoriasis on face). 30 g 1  . Multiple Vitamins-Minerals (CENTRUM SILVER ADULT 50+ PO) Take 1 tablet by mouth daily.    . naproxen sodium (ALEVE) 220 MG tablet Take 220 mg  by mouth daily as needed.    . lidocaine-prilocaine (EMLA) cream Apply to affected area once (Patient not taking: Reported on 03/13/2019) 30 g 3   No current facility-administered medications on file prior to visit.     No Known Allergies     Observations/Objective: Today's Vitals   03/13/19 0952  PainSc: 0-No pain   There is no height or weight on file to calculate BMI.  Physical Exam  Constitutional: She is oriented to person, place, and time and well-developed, well-nourished, and in no distress. No distress.  HENT:  Head: Atraumatic.  Pulmonary/Chest: Effort normal.  Neurological: She is alert and oriented to person, place, and time.  Psychiatric: Affect normal.    CBC    Component Value Date/Time   WBC 8.1 12/04/2018 1217   WBC 3.9 (L) 08/22/2018 1110   RBC 4.12 12/04/2018 1217   RBC 3.21 (L) 08/22/2018 1110   HGB 12.9 12/04/2018 1217   HCT 37.6 12/04/2018 1217   PLT 260 12/04/2018 1217   MCV 91 12/04/2018 1217   MCH 31.3 12/04/2018 1217   MCH 33.0 08/22/2018 1110   MCHC 34.3 12/04/2018 1217   MCHC 32.6 08/22/2018 1110   RDW 11.8 12/04/2018 1217   LYMPHSABS 2.0 12/04/2018 1217   MONOABS 0.5 08/22/2018 1110   EOSABS 0.1 12/04/2018 1217   BASOSABS  0.1 12/04/2018 1217    CMP     Component Value Date/Time   NA 140 12/04/2018 1217   K 4.0 12/04/2018 1217   CL 102 12/04/2018 1217   CO2 22 12/04/2018 1217   GLUCOSE 94 12/04/2018 1217   GLUCOSE 93 08/22/2018 1110   BUN 16 12/04/2018 1217   CREATININE 0.79 12/04/2018 1217   CALCIUM 9.8 12/04/2018 1217   PROT 7.0 12/04/2018 1217   ALBUMIN 4.2 12/04/2018 1217   AST 15 12/04/2018 1217   ALT 12 12/04/2018 1217   ALKPHOS 77 12/04/2018 1217   BILITOT 0.3 12/04/2018 1217   GFRNONAA 79 12/04/2018 1217   GFRAA 91 12/04/2018 1217   RADIOGRAPHIC STUDIES: I have personally reviewed the radiological images as listed and agreed with the findings in the report. 03/12/2019 CT chest wo contrast Stable scattered pulmonary nodules please, including a 1.2 part solid nodule in the right upper lobe.  The solid component remains stable at approximately 0.4 cm.  Follow-up noncontrast CT recommended at a 3 to 6 months to confirm persistence.  If unchanged, and a solid component remains less than 6 mm, annual CT is recommended and to 5 years of stability has been established. Additional scattered pulmonary nodules measuring up to approximately 80 mm.  Stable from prior studies. Bilateral nonobstructing renal nephrolithiasis again noted.  Mild bilateral interlobular septal thickening consistent with mild volume overload.    No pre-op  CA 125. Post op CA 125 was normal    Assessment and Plan: 1. Uterine carcinosarcoma ([place])   2. Solitary pulmonary nodule   3. Port-A-Cath in place   4. Neuropathy     CT results was independently reviewed and discussed with patient.  Stable solitary pulmonary nodules.  Likely benign etiology.  I recommend repeat another CT scan in 4 months to establish stability.  Uterine carcinosarcoma, continue follow-up with GYN oncology for pelvic exam and surveillance.Abdominal pelvis imaging based on symptoms and physical examination.   Port-A-Cath in place, recommend continue  port flush every 6 to 8 weeks. Neuropathy, she does not want to be restarted on gabapentin.  Reports symptoms are manageable.  Follow Up Instructions:  Lab MD in 6 months. Check cbc, cmp and CA 125.   Orders Placed This Encounter  Procedures  . CT Chest Wo Contrast    Standing Status:   Future    Standing Expiration Date:   03/12/2020    Order Specific Question:   ** REASON FOR EXAM (FREE TEXT)    Answer:   follow up on lung nodule.    Order Specific Question:   Preferred imaging location?    Answer:   [facility]    Order Specific Question:   Radiology Contrast Protocol - do NOT remove file path    Answer:   [email or web address]    I discussed the assessment and treatment plan with the patient. The patient was provided an opportunity to ask questions and all were answered. The patient agreed with the plan and demonstrated an understanding of the instructions.  The patient was advised to call back or seek an in-person evaluation if the symptoms worsen or if the condition fails to improve as anticipated.   I provided 15 minutes of face-to-face video visit time during this encounter, and > 50% was spent counseling as documented under my assessment & plan.   Earlie Server, MD 03/14/2019 11:13 PM

## 2019-03-15 ENCOUNTER — Inpatient Hospital Stay: Payer: Medicare Other | Admitting: Oncology

## 2019-03-25 ENCOUNTER — Ambulatory Visit: Payer: Medicare Other | Admitting: Radiation Oncology

## 2019-04-08 ENCOUNTER — Other Ambulatory Visit: Payer: Self-pay

## 2019-04-08 ENCOUNTER — Inpatient Hospital Stay: Payer: Medicare Other | Attending: Oncology

## 2019-04-08 DIAGNOSIS — Z452 Encounter for adjustment and management of vascular access device: Secondary | ICD-10-CM | POA: Insufficient documentation

## 2019-04-08 DIAGNOSIS — C55 Malignant neoplasm of uterus, part unspecified: Secondary | ICD-10-CM | POA: Insufficient documentation

## 2019-04-08 DIAGNOSIS — Z95828 Presence of other vascular implants and grafts: Secondary | ICD-10-CM

## 2019-04-08 MED ORDER — HEPARIN SOD (PORK) LOCK FLUSH 100 UNIT/ML IV SOLN
500.0000 [IU] | Freq: Once | INTRAVENOUS | Status: AC
  Administered 2019-04-08: 500 [IU] via INTRAVENOUS

## 2019-04-08 MED ORDER — SODIUM CHLORIDE 0.9% FLUSH
10.0000 mL | Freq: Once | INTRAVENOUS | Status: AC
  Administered 2019-04-08: 10 mL via INTRAVENOUS
  Filled 2019-04-08: qty 10

## 2019-05-20 ENCOUNTER — Inpatient Hospital Stay: Payer: Medicare Other | Attending: Oncology

## 2019-05-20 DIAGNOSIS — Z95828 Presence of other vascular implants and grafts: Secondary | ICD-10-CM

## 2019-05-20 DIAGNOSIS — C55 Malignant neoplasm of uterus, part unspecified: Secondary | ICD-10-CM | POA: Insufficient documentation

## 2019-05-20 DIAGNOSIS — Z452 Encounter for adjustment and management of vascular access device: Secondary | ICD-10-CM | POA: Insufficient documentation

## 2019-05-20 MED ORDER — SODIUM CHLORIDE 0.9% FLUSH
10.0000 mL | Freq: Once | INTRAVENOUS | Status: AC
  Administered 2019-05-20: 10 mL via INTRAVENOUS
  Filled 2019-05-20: qty 10

## 2019-05-20 MED ORDER — HEPARIN SOD (PORK) LOCK FLUSH 100 UNIT/ML IV SOLN
500.0000 [IU] | Freq: Once | INTRAVENOUS | Status: AC
  Administered 2019-05-20: 500 [IU] via INTRAVENOUS

## 2019-06-05 ENCOUNTER — Inpatient Hospital Stay: Payer: Medicare Other

## 2019-06-12 ENCOUNTER — Ambulatory Visit
Admission: RE | Admit: 2019-06-12 | Discharge: 2019-06-12 | Disposition: A | Discharge status: Home / Self Care | Payer: Medicare Other | Source: Ambulatory Visit | Attending: Radiation Oncology | Admitting: Radiation Oncology

## 2019-06-12 ENCOUNTER — Other Ambulatory Visit: Payer: Self-pay

## 2019-06-12 ENCOUNTER — Ambulatory Visit: Payer: Medicare Other

## 2019-06-12 ENCOUNTER — Encounter: Payer: Self-pay | Admitting: Radiation Oncology

## 2019-06-12 VITALS — BP 131/71 | HR 63 | Temp 98.4°F | Resp 16 | Wt 182.5 lb

## 2019-06-12 DIAGNOSIS — Z90710 Acquired absence of both cervix and uterus: Secondary | ICD-10-CM | POA: Insufficient documentation

## 2019-06-12 DIAGNOSIS — R918 Other nonspecific abnormal finding of lung field: Secondary | ICD-10-CM | POA: Insufficient documentation

## 2019-06-12 DIAGNOSIS — Z923 Personal history of irradiation: Secondary | ICD-10-CM | POA: Diagnosis not present

## 2019-06-12 DIAGNOSIS — Z9221 Personal history of antineoplastic chemotherapy: Secondary | ICD-10-CM | POA: Diagnosis not present

## 2019-06-12 DIAGNOSIS — C541 Malignant neoplasm of endometrium: Secondary | ICD-10-CM

## 2019-06-12 NOTE — Progress Notes (Signed)
Radiation Oncology Follow up Note  Name: Katherine Snyder   Date:   06/12/2019 MRN:  [id number] DOB: July 31, 1951    This 68 y.o. female presents to the clinic today for 33-month follow-up status post vaginal brachytherapy for stage I carcinosarcoma of the uterus status post robotic assisted hysterectomy and pelvic lymph node sampling.  REFERRING PROVIDER: Margo Common, PA  HPI: Patient is a 68 year old female now about 6 months having completed vaginal brachytherapy for stage I carcinosarcoma of the uterus status post robotic assisted hysterectomy and pelvic lymph node sampling.  She also received adjuvant chemotherapy seen today in routine follow-up she is doing well.  She specifically denies vaginal discharge any increased lower urinary tract symptoms or diarrhea.  She has CT scan in May which I have reviewed again shows stable scattered pulmonary nodules with follow-up CT scan recommended in 3 to 6 months.  COMPLICATIONS OF TREATMENT: none  FOLLOW UP COMPLIANCE: keeps appointments   PHYSICAL EXAM:  BP 131/71 (BP Location: Left Arm, Patient Position: Sitting)   Pulse 63   Temp 98.4 F (36.9 C) (Tympanic)   Resp 16   Wt 182 lb 8 oz (82.8 kg)   BMI 27.75 kg/m  Well-developed well-nourished patient in NAD. HEENT reveals PERLA, EOMI, discs not visualized.  Oral cavity is clear. No oral mucosal lesions are identified. Neck is clear without evidence of cervical or supraclavicular adenopathy. Lungs are clear to A&P. Cardiac examination is essentially unremarkable with regular rate and rhythm without murmur rub or thrill. Abdomen is benign with no organomegaly or masses noted. Motor sensory and DTR levels are equal and symmetric in the upper and lower extremities. Cranial nerves II through XII are grossly intact. Proprioception is intact. No peripheral adenopathy or edema is identified. No motor or sensory levels are noted. Crude visual fields are within normal range.  RADIOLOGY RESULTS: CT  scans reviewed compatible with above-stated findings  PLAN: Present time patient is doing well she is scheduled for to see Dr. Fransisca Connors next week we will do a pelvic exam I will come upstairs to examine her with him if that is necessary.  Otherwise have asked to see her back in 6 months and then will start once a year follow-up visits.  Patient will have follow-up CT scans of abdomen pelvis ordered by GYN oncology.  Patient knows to call with any concerns.  I would like to take this opportunity to thank you for allowing me to participate in the care of your patient.Noreene Filbert, MD

## 2019-06-19 ENCOUNTER — Inpatient Hospital Stay: Payer: Medicare Other | Attending: Obstetrics and Gynecology | Admitting: Obstetrics and Gynecology

## 2019-06-19 ENCOUNTER — Other Ambulatory Visit: Payer: Self-pay

## 2019-06-19 VITALS — BP 129/77 | HR 69 | Temp 97.1°F | Resp 16 | Ht 68.0 in | Wt 183.6 lb

## 2019-06-19 DIAGNOSIS — Z90722 Acquired absence of ovaries, bilateral: Secondary | ICD-10-CM | POA: Diagnosis not present

## 2019-06-19 DIAGNOSIS — M810 Age-related osteoporosis without current pathological fracture: Secondary | ICD-10-CM | POA: Insufficient documentation

## 2019-06-19 DIAGNOSIS — Z923 Personal history of irradiation: Secondary | ICD-10-CM | POA: Insufficient documentation

## 2019-06-19 DIAGNOSIS — Z452 Encounter for adjustment and management of vascular access device: Secondary | ICD-10-CM | POA: Diagnosis not present

## 2019-06-19 DIAGNOSIS — C541 Malignant neoplasm of endometrium: Secondary | ICD-10-CM | POA: Diagnosis not present

## 2019-06-19 DIAGNOSIS — M722 Plantar fascial fibromatosis: Secondary | ICD-10-CM | POA: Diagnosis not present

## 2019-06-19 DIAGNOSIS — G629 Polyneuropathy, unspecified: Secondary | ICD-10-CM | POA: Insufficient documentation

## 2019-06-19 DIAGNOSIS — Z90710 Acquired absence of both cervix and uterus: Secondary | ICD-10-CM | POA: Diagnosis not present

## 2019-06-19 DIAGNOSIS — F17210 Nicotine dependence, cigarettes, uncomplicated: Secondary | ICD-10-CM | POA: Insufficient documentation

## 2019-06-19 DIAGNOSIS — Z9221 Personal history of antineoplastic chemotherapy: Secondary | ICD-10-CM | POA: Diagnosis not present

## 2019-06-19 NOTE — Progress Notes (Signed)
Gynecologic Oncology Interval Visit   Referring Provider: Dr. Amalia Hailey  Chief Complaint: Endometrial Cancer  Subjective:  SARAHMARIE LEAVEY is a 68 y.o. 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    She received her 6th cycle of carbo-taxol on 08/08/2018.  She received vaginal brachytherapy with Dr. Baruch Gouty 10/19-11/09/2018.   No complaints today. See CT scan results below that were done to follow up indeterminate pulmonary nodules. She stopped smoking 2/20, but occasionally using vaping products.    CT scan chest 5/20 IMPRESSION: 1. Stable scattered pulmonary nodules as detailed above including a 1.2 cm part solid nodule in the right upper lobe. The solid component remains stable at approximately 0.4 cm. Follow-up non-contrast CT recommended at 3-6 months to confirm persistence. If unchanged, and solid component remains <6 mm, annual CT is recommended until 5 years of stability has been established. If persistent these nodules should be considered highly suspicious if the solid component of the nodule is 6 mm or greater in size and enlarging. This recommendation follows the consensus statement: Guidelines for Management of Incidental Pulmonary Nodules Detected on CT Images: From the Fleischner Society 2017; Radiology 2017; 284:228-243. 2. Additional scattered pulmonary nodules as detailed above measuring up to approximately 8 mm. These are stable from prior study. Attention on yearly follow-up examinations is recommended. 3. Bilateral nonobstructing renal nephroliths are again noted. 4. Mild bilateral interlobular septal thickening consistent with mild volume overload.  GYNECOLOGIC ONCOLOGY HISTORY:  Initially, patient presented to ER on 02/26/18 for PMB. She was reportedly 14 years post menopausal and had had a 2  day episode of vaginal bleeding that was constant.   02/26/18- Ultrasound Pelvic Complete w/ Transvaginal: Endometrium thickness up to 29mm. No focal solid or cystic change. IMPRESSION: Mildly enlarged uterus. No discrete fibroids. Markedly thickened endometrium. In the setting of post-menopausal bleeding, endometrial sampling is indicated to exclude carcinoma. If results are benign, sonohysterogram should be considered for focal lesion work-up. (Ref: Radiological Reasoning: Algorithmic Workup of Abnormal Vaginal Bleeding with Endovaginal Sonography and Sonohysterography. AJR 2008; 109:N23-55) Nonvisualization of the right ovary. Normal-appearing left ovary. No significant uterine fibroids were observed.  She was seen by Dr. Ellard Artis son 03/01/18 who performed vacurette endometrial biopsy. Uterus was sounded to length to 9 cm. She was started on Aygestin and bleeding stopped.   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  COMMENT: BASED ON INITIAL HISTOLOGIC FINDINGS, IMMUNOHISTOCHEMISTRY IS EVALUATED:  THE SARCOMATOUS TUMOR COMPONENT IS DIFFUSELY POSITIVE FOR CD10 AND THE CARCINOMA COMPONENT IS POSITIVE FOR PANKERATIN, SUPPORTING THE DIAGNOSIS.   She was seen by Dr. Fransisca Connors, Gyn-Onc on 03/28/2018 for initial evaluation and treatment planning.  TLH BSO with sentinel lymph node mapping and biopsies and aortic lymph node dissection were discussed.  [facility]gery at [facility] was recommended but unfortunately, patient's insurance will not cover.   CA 125- 10.5  03/30/18 - CT C/A/P W CONTRAST IMPRESSION: 1. Mass like expansion of the endometrial cavity known primary endometrial carcinoma. 2. No evidence for nodal metastasis or solid organ metastasis within the abdomen or pelvis. 3. Scattered nonspecific solid nodules and a single part solid nodule identified in both lungs. Consensus criteria  recommendations for nodule followup does not apply to patients with known primary malignancy. 4. Liver cysts.  She underwent CT Imaging and then  robot assisted total hysterectomy with sentinel LN mapping, biopsies, and aortic LN dissection with Dr. Theora Gianotti at [facility] 04/04/18.   A. UTERUS WITH CERVIX; HYSTERECTOMY:  - CARCINOSARCOMA.  - MYOMETRIAL INVASION PRESENT, LESS THAN HALF OF THE MYOMETRIUM.  - NEGATIVE FOR CERVICAL AND SEROSAL INVOLVEMENT.  - SUBMUCOSAL LEIOMYOMA.   FALLOPIAN TUBE AND OVARY, LEFT; SALPINGO-OOPHORECTOMY:  - NEGATIVE FOR MALIGNANCY.  - OVARIAN CORTICAL INCLUSION CYSTS.  - ATROPHIC FALLOPIAN TUBE.   B. SENTINEL LYMPH NODE, RIGHT PRIMARY OBTURATOR; EXCISION:  - NEGATIVE FOR MALIGNANCY, ONE LYMPH NODE (0/1).   C. LYMPH NODE, RIGHT EXTERNAL ILIAC VEIN; EXCISION:  - NEGATIVE FOR MALIGNANCY, ONE LYMPH NODE (0/1).   D. FALLOPIAN TUBE AND OVARY, UNILATERAL (RIGHT); SALPINGO-OOPHORECTOMY:  - NEGATIVE FOR MALIGNANCY.  - OVARIAN CORTICAL INCLUSION CYSTS.  - ATROPHIC FALLOPIAN TUBE.   E. SENTINEL LYMPH NODE, RIGHT LOW PARA-AORTIC; EXCISION:  - NEGATIVE FOR MALIGNANCY, TWO LYMPH NODES (0/2).   F. SENTINEL LYMPH NODE, LEFT PRIMARY LOW PARA-AORTIC; EXCISION:  - NEGATIVE FOR MALIGNANCY, ONE LYMPH NODE (0/1).   DIAGNOSIS:  A. PELVIC WASHINGS:  - NEGATIVE FOR MALIGNANCY.    Her case was discussed at [place] and discussed that recurrence risk without adjuvant treatment is up to 50%.  So recommend adjuvant chemo and radiation.  She was seen by Dr. Baruch Gouty, for initial evaluation of stage IA uterine carcinosarcoma and plan is for brachytherapy after 6 cycles of carbo/taxol.    She initiated carbo-Taxol on 04/25/2018.  Completed 6 cycles on 08/08/2018.  07/11/18- CT Chest WO Contrast for follow-up on pulmonary nodules IMPRESSION: 1. Previously visualized pulmonary nodules are either stable or resolved. Several new subcentimeter bilateral pulmonary nodules. Pulmonary  nodularity generally appears centrilobular in distribution and while considered indeterminate is more suggestive of infectious/inflammatory nodularity. Continued chest CT surveillance is recommended in 3-6 months. 2. No thoracic adenopathy.  12/10/2018- CT Chest wo contrast - New sub solid density measuring 12 mm with associated 3 mm solid nodule is noted in left upper lobe. There also noted several other new nodules noted more posteriorly in the left upper lobe, with the largest measuring 3 mm. These may simply be inflammatory in etiology, but neoplasm can not be excluded. Initial follow-up by chest CT without contrast is recommended in 3 months to confirm persistence. - Stable sub solid density is noted in right upper lobe with grossly stable 4 mm solid nodule. Multiple other pulmonary nodules are noted in both lungs which are stable compared to prior exam - Small nonobstructive right renal calculus - Stable left thyroid nodule - Stable hepatic cyst  Problem List: Patient Active Problem List   Diagnosis Date Noted  . Neuropathy 05/16/2018  . Fatigue 05/16/2018  . Inflammatory heel pain, right 05/16/2018  . Encounter for antineoplastic chemotherapy 05/16/2018  . Drug-induced neutropenia ([place]) 05/09/2018  . Goals of care, counseling/discussion 04/18/2018  . Endometrial cancer ([place])   . Seborrhea 05/13/2016  . History of kidney stones 06/26/2015  . OP (osteoporosis) 06/26/2015  . Plantar fasciitis 06/26/2015  . Current tobacco use 06/26/2015  . Chronic airway obstruction ([place]) 10/08/2009  . Psoriasis 09/30/2008  . 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 Menopausal symptom 09/21/2007  . Tobacco use 09/21/2007  . Awareness of heartbeats 09/21/2007    Past Medical History: Past Medical History:  Diagnosis Date  . Arthritis   . Cancer [facility])    endometrial  . Endometrial mass   . History of chicken pox   . History of kidney stones   .  History of measles   .  History of mumps   . Osteoporosis   . PMB (postmenopausal bleeding)   . Psoriasis     Past Surgical History: Past Surgical History:  Procedure Laterality Date  . APPENDECTOMY  1960  . BREAST SURGERY Left    biopsy  . Injection varicose veins in legs Bilateral 2010   Dr. Hulda Humphrey  . LITHOTRIPSY  1994, 2004   for renal stones  . PORTA CATH INSERTION N/A 04/24/2018   Procedure: PORTA CATH INSERTION;  Surgeon: Katha Cabal, MD;  Location: [facility];  Service: Cardiovascular;  Laterality: N/A;  . ROBOTIC ASSISTED TOTAL HYSTERECTOMY Bilateral 04/04/2018   Procedure: ROBOTIC ASSISTED TOTAL HYSTERECTOMY,SENTINEL LYMPH NODE MAPPING AND BIOPSIES,AORTIC LYMPH NODE DISSECTION;  Surgeon: Gillis Ends, MD;  Location: [facility];  Service: Gynecology;  Laterality: Bilateral;  . TUBAL LIGATION  1980    OB History:  OB History  Gravida Para Term Preterm AB Living  1 1 1     1   SAB TAB Ectopic Multiple Live Births          1    # Outcome Date GA Lbr Len/2nd Weight Sex Delivery Anes PTL Lv  1 Term 69    M Vag-Spont   LIV  G1P1001 LMP:   Family History: Family History  Problem Relation Age of Onset  . Breast cancer Mother   . Transient ischemic attack Mother   . Arthritis Mother   . Hypothyroidism Mother   . Lung cancer Mother   . Diabetes Father        type 2  . Hypertension Father   . Arthritis Father   . Colon cancer Sister   . Prostate cancer Brother   . Hyperlipidemia Son   . Stomach cancer Maternal Grandmother   . Stroke Maternal Grandfather     Social History: Social History   Socioeconomic History  . Marital status: Single    Spouse name: Not on file  . Number of children: 1  . Years of education: Not on file  . Highest education level: Not on file  Occupational History  . Occupation: Employed    Comment: Works at a [employer] doing [employer]  Social Needs  . Financial resource strain: Not on file  . Food insecurity    Worry: Not on file     Inability: Not on file  . Transportation needs    Medical: Not on file    Non-medical: Not on file  Tobacco Use  . Smoking status: Current Every Day Smoker    Packs/day: 0.25    Types: Cigarettes  . Smokeless tobacco: Never Used  Substance and Sexual Activity  . Alcohol use: No    Alcohol/week: 0.0 standard drinks  . Drug use: No  . Sexual activity: Not Currently  Lifestyle  . Physical activity    Days per week: Not on file    Minutes per session: Not on file  . Stress: Not on file  Relationships  . Social Herbalist on phone: Not on file    Gets together: Not on file    Attends religious service: Not on file    Active member of club or organization: Not on file    Attends meetings of clubs or organizations: Not on file    Relationship status: Not on file  . Intimate partner violence    Fear of current or ex partner: Not on file    Emotionally abused:  Not on file    Physically abused: Not on file    Forced sexual activity: Not on file  Other Topics Concern  . Not on file  Social History Narrative  . Not on file    Allergies: No Known Allergies  Current Medications: Current Outpatient Medications  Medication Sig Dispense Refill  . acetaminophen (TYLENOL) 325 MG tablet Take 650 mg by mouth every 6 (six) hours as needed.    . betamethasone dipropionate (DIPROLENE) 0.05 % cream Apply 1 application topically 2 (two) times daily as needed (for psoriasis on body). 30 g 1  . desonide (DESOWEN) 0.05 % cream Apply 1 application topically 2 (two) times daily as needed (for psoriasis on face). 30 g 1  . lidocaine-prilocaine (EMLA) cream Apply to affected area once (Patient not taking: Reported on 03/13/2019) 30 g 3  . Multiple Vitamins-Minerals (CENTRUM SILVER ADULT 50+ PO) Take 1 tablet by mouth daily.    . naproxen sodium (ALEVE) 220 MG tablet Take 220 mg by mouth daily as needed.     No current facility-administered medications for this visit.    Review of  Systems General:  no complaints Skin: no complaints Eyes: no complaints HEENT: no complaints Breasts: no complaints Pulmonary: no complaints Cardiac: no complaints Gastrointestinal: no complaints Genitourinary/Sexual: no complaints Ob/Gyn: no complaints Musculoskeletal: no complaints Hematology: no complaints Neurologic/Psych: no complaints  Objective:  Physical Examination:  Vitals:   06/19/19 1500  BP: 129/77  Pulse: 69  Resp: 16  Temp: (!) 97.1 F (36.2 C)      ECOG Performance Status: 0 - Asymptomatic  GENERAL: Patient is a well appearing female in no acute distress HEENT:  Sclera clear. Anicteric NODES:  Negative axillary, supraclavicular, inguinal lymph node survery LUNGS:  Clear to auscultation bilaterally.   HEART:  Regular rate and rhythm.  ABDOMEN:  Soft, nontender.  No hernias, incisions well healed. No masses or ascites EXTREMITIES:  No peripheral edema. Atraumatic. No cyanosis SKIN:  Clear with no obvious rashes or skin changes.  NEURO:  Nonfocal. Well oriented.  Appropriate affect.  PELVIC: EGBUS: normal, Vagina: atropic with no lesions.  Bimanual/RV: normal, no masses.    Assessment:  ALEXICA SCHLOSSBERG is a 68 y.o. female diagnosed with stage IA uterine carcinosarcoma with RA TLH/BSO, negative staging 5/19 followed by vaginal brachytherapy and 6 cycles of carbo/taxol chemotherapy.  NED on exam today.    Chest CTs have  shown indeterminate lung nodules, some disapearing over time and some new thought to be benign.  She was a smoker, but stopped in 2/20.      Comorbidities complicating care: smoker, plantar fasciitis  Plan:   Problem List Items Addressed This Visit      Genitourinary   Endometrial cancer [facility]) - Primary     Patient will  be following up with Dr Tasia Catchings and has CT scan chest scheduled again later this year for follow up of lung nodules. We will see her back in 6 months, or sooner if any issues arise.   Mellody Drown, MD  CC:  [place], [place] [address] [place],  [place] [address] [phone]

## 2019-06-19 NOTE — Progress Notes (Signed)
Pt does not have gyn concerns.

## 2019-07-01 ENCOUNTER — Inpatient Hospital Stay: Payer: Medicare Other

## 2019-07-01 ENCOUNTER — Other Ambulatory Visit: Payer: Self-pay

## 2019-07-01 DIAGNOSIS — Z452 Encounter for adjustment and management of vascular access device: Secondary | ICD-10-CM | POA: Diagnosis not present

## 2019-07-01 DIAGNOSIS — C541 Malignant neoplasm of endometrium: Secondary | ICD-10-CM | POA: Diagnosis not present

## 2019-07-01 DIAGNOSIS — Z923 Personal history of irradiation: Secondary | ICD-10-CM | POA: Diagnosis not present

## 2019-07-01 DIAGNOSIS — M810 Age-related osteoporosis without current pathological fracture: Secondary | ICD-10-CM | POA: Diagnosis not present

## 2019-07-01 DIAGNOSIS — Z9221 Personal history of antineoplastic chemotherapy: Secondary | ICD-10-CM | POA: Diagnosis not present

## 2019-07-01 DIAGNOSIS — M722 Plantar fascial fibromatosis: Secondary | ICD-10-CM | POA: Diagnosis not present

## 2019-07-01 DIAGNOSIS — G629 Polyneuropathy, unspecified: Secondary | ICD-10-CM | POA: Diagnosis not present

## 2019-07-01 DIAGNOSIS — Z95828 Presence of other vascular implants and grafts: Secondary | ICD-10-CM

## 2019-07-01 MED ORDER — HEPARIN SOD (PORK) LOCK FLUSH 100 UNIT/ML IV SOLN
500.0000 [IU] | Freq: Once | INTRAVENOUS | Status: AC
  Administered 2019-07-01: 500 [IU] via INTRAVENOUS

## 2019-07-01 MED ORDER — SODIUM CHLORIDE 0.9% FLUSH
10.0000 mL | Freq: Once | INTRAVENOUS | Status: AC
  Administered 2019-07-01: 10 mL via INTRAVENOUS
  Filled 2019-07-01: qty 10

## 2019-07-12 ENCOUNTER — Ambulatory Visit
Admission: RE | Admit: 2019-07-12 | Discharge: 2019-07-12 | Disposition: A | Discharge status: Home / Self Care | Payer: Medicare Other | Source: Ambulatory Visit | Attending: Oncology | Admitting: Oncology

## 2019-07-12 ENCOUNTER — Other Ambulatory Visit: Payer: Self-pay

## 2019-07-12 DIAGNOSIS — R918 Other nonspecific abnormal finding of lung field: Secondary | ICD-10-CM | POA: Diagnosis not present

## 2019-07-12 DIAGNOSIS — R911 Solitary pulmonary nodule: Secondary | ICD-10-CM | POA: Insufficient documentation

## 2019-07-17 ENCOUNTER — Ambulatory Visit
Admission: RE | Admit: 2019-07-17 | Discharge: 2019-07-17 | Disposition: A | Discharge status: Home / Self Care | Payer: Medicare Other | Source: Ambulatory Visit | Attending: Oncology | Admitting: Oncology

## 2019-07-17 ENCOUNTER — Inpatient Hospital Stay (HOSPITAL_BASED_OUTPATIENT_CLINIC_OR_DEPARTMENT_OTHER): Payer: Medicare Other | Admitting: Oncology

## 2019-07-17 ENCOUNTER — Encounter: Payer: Self-pay | Admitting: Oncology

## 2019-07-17 ENCOUNTER — Other Ambulatory Visit: Payer: Self-pay

## 2019-07-17 ENCOUNTER — Inpatient Hospital Stay: Payer: Medicare Other | Attending: Oncology

## 2019-07-17 VITALS — BP 127/83 | HR 68 | Temp 97.8°F | Ht 68.0 in | Wt 182.0 lb

## 2019-07-17 DIAGNOSIS — M25511 Pain in right shoulder: Secondary | ICD-10-CM | POA: Insufficient documentation

## 2019-07-17 DIAGNOSIS — G629 Polyneuropathy, unspecified: Secondary | ICD-10-CM

## 2019-07-17 DIAGNOSIS — C541 Malignant neoplasm of endometrium: Secondary | ICD-10-CM | POA: Diagnosis present

## 2019-07-17 DIAGNOSIS — R911 Solitary pulmonary nodule: Secondary | ICD-10-CM | POA: Diagnosis not present

## 2019-07-17 DIAGNOSIS — C55 Malignant neoplasm of uterus, part unspecified: Secondary | ICD-10-CM

## 2019-07-17 DIAGNOSIS — Z95828 Presence of other vascular implants and grafts: Secondary | ICD-10-CM

## 2019-07-17 DIAGNOSIS — Z87891 Personal history of nicotine dependence: Secondary | ICD-10-CM | POA: Diagnosis not present

## 2019-07-17 DIAGNOSIS — R5382 Chronic fatigue, unspecified: Secondary | ICD-10-CM | POA: Insufficient documentation

## 2019-07-17 DIAGNOSIS — R918 Other nonspecific abnormal finding of lung field: Secondary | ICD-10-CM | POA: Insufficient documentation

## 2019-07-17 LAB — COMPREHENSIVE METABOLIC PANEL
ALT: 15 U/L (ref 0–44)
AST: 18 U/L (ref 15–41)
Albumin: 4.2 g/dL (ref 3.5–5.0)
Alkaline Phosphatase: 65 U/L (ref 38–126)
Anion gap: 11 (ref 5–15)
BUN: 20 mg/dL (ref 8–23)
CO2: 26 mmol/L (ref 22–32)
Calcium: 9.5 mg/dL (ref 8.9–10.3)
Chloride: 104 mmol/L (ref 98–111)
Creatinine, Ser: 0.83 mg/dL (ref 0.44–1.00)
GFR calc Af Amer: >60 mL/min (ref >60)
GFR calc non Af Amer: >60 mL/min (ref >60)
Glucose, Bld: 99 mg/dL (ref 70–99)
Potassium: 4.2 mmol/L (ref 3.5–5.1)
Sodium: 141 mmol/L (ref 135–145)
Total Bilirubin: 0.6 mg/dL (ref 0.3–1.2)
Total Protein: 7.3 g/dL (ref 6.5–8.1)

## 2019-07-17 LAB — CBC WITH DIFFERENTIAL/PLATELET
Abs Immature Granulocytes: 0.01 10*3/uL (ref 0.00–0.07)
Basophils Absolute: 0.1 10*3/uL (ref 0.0–0.1)
Basophils Relative: 1 %
Eosinophils Absolute: 0.1 10*3/uL (ref 0.0–0.5)
Eosinophils Relative: 1 %
HCT: 40.8 % (ref 36.0–46.0)
Hemoglobin: 13.3 g/dL (ref 12.0–15.0)
Immature Granulocytes: 0 %
Lymphocytes Relative: 27 %
Lymphs Abs: 1.7 10*3/uL (ref 0.7–4.0)
MCH: 29.7 pg (ref 26.0–34.0)
MCHC: 32.6 g/dL (ref 30.0–36.0)
MCV: 91.1 fL (ref 80.0–100.0)
Monocytes Absolute: 0.6 10*3/uL (ref 0.1–1.0)
Monocytes Relative: 10 %
Neutro Abs: 3.8 10*3/uL (ref 1.7–7.7)
Neutrophils Relative %: 61 %
Platelets: 198 10*3/uL (ref 150–400)
RBC: 4.48 MIL/uL (ref 3.87–5.11)
RDW: 12.8 % (ref 11.5–15.5)
WBC: 6.2 10*3/uL (ref 4.0–10.5)
nRBC: 0 % (ref 0.0–0.2)

## 2019-07-17 NOTE — Progress Notes (Signed)
Hematology/Oncology Follow up  note [facility] Telephone:([phone]) [phone] [phone]   Patient Care Team: Chrismon, Vickki Muff, PA as PCP - General (Family Medicine) Mellody Drown, MD as Referring Physician (Obstetrics and Gynecology) Gillis Ends, MD as Referring Physician (Obstetrics and Gynecology) Clent Jacks, RN as Registered Nurse Noreene Filbert, MD as Referring Physician (Radiation Oncology) Earlie Server, MD as Consulting Physician (Oncology) Schnier, Dolores Lory, MD (Vascular Surgery) REASON FOR VISIT Follow up for treatment of uterus carcinosarcoma  HISTORY OF PRESENTING ILLNESS:  Katherine Snyder is a  68 y.o.  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 Pathology showed  A uterus with cervix hysterectomy -Carcinosarcoma B sentinel lymph node, right primary obturator negative C lymph node right external iliac lymph negative D fallopian tube and ovary, unilateral right negative E sentinel lymph node right low periaortic: Negative F sentinel lymph node left low periaortic negative  CANCER CASE SUMMARY: ENDOMETRIUM  Procedure: Total hysterectomy and bilateral salpingo-oophorectomy  Histologic Type: Carcinosarcoma  Histologic Grade: Not applicable  Myometrial Invasion: Present    Depth of myometrial invasion cannot be determined due to exophytic  tumor growth and effacement of endometrial/myometrial junction    Myometrial thickness: At least 8 mm    Percentage depth of myometrial invasion: Estimated less than 50%  Uterine Serosa Involvement: Not identified  Cervical Stromal Involvement: Not identified  Other Tissue/Organ Involvement: Not identified  Peritoneal/ Ascitic Fluid: Negative for malignancy  Lymphovascular Invasion: Present    Regional Lymph Nodes: All lymph nodes negative for tumor cells  Number of Lymph Nodes Examined: 5    Total number of pelvic nodes examined: 2    Number of pelvic sentinel nodes examined: 1    Total number of para-aortic nodes examined: 3    Number of para-aortic sentinel nodes examined: 3   Pathologic Stage Classification (pTNM, AJCC 8th Edition) (Note J): pT1a  pN0/FIGO IA   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 Status post adjuvant vaginal brachi therapy.   Pertinent oncology history Katherine Snyder is a 68 y.o. female who has above history reviewed by me today presents for assessment prior to Cycle 6 adjuvant chemotherapy management for stage Ia endometrial carcinosarcoma. During cycle 1 treatment, reported feeling burning down and having really bad heartburn.  Taxol was temporarily stopped.  The patient was given Benadryl, Solu-Medrol, Pepcid, symptoms improved and resolved.  Taxol was restarted and patient is able to finish her treatment. Patient reports feeling extremely tired for 2 days after chemotherapy.  She was seen by nurse practitioner Sonia Baller during interval.   She complained burning sensation with urination and had UA and urine culture obtained.  UA was not convincing for UTI urine culture showed less than 10,000 colonies of insignificant growth.  Patient remains afebrile. Patient also reports her chronic fasciitis of foot has got a lot worse since the start of chemotherapy.  Possible neuropathy was discussed and the patient is reluctant to start gabapentin or Cymbalta.  She was given tramadol 100 mg twice daily.  Patient reports symptoms gradually improved.    Patient was found to have neutropenia so she received Neupogen x 2. She reports bone pain started after Neupogen shots.   Marland Kitchen#Lung  nodules # 12/10/2018- CT Chest wo  contrast -New sub solid density measuring 12 mm with associated 3 mm solid nodule is noted in left upper lobe.Stable sub solid density is noted in right upper lobe with grossly stable 4 mm solid nodule. Multiple other pulmonary nodules are noted in both lungs which are stable compared to prior exam  Case was discussed on tumor board and recommend surveillance CT in 3 months.  Patient had follow-up CT chest done and present for discussion of CT results.   INTERVAL HISTORY Katherine Snyder is a 68 y.o. female who has above history reviewed by me presents for assessment prior to Cycle 6 adjuvant chemotherapy for stage Ia endometrial carcinosarcoma.  Problems and complaints are listed below: Patient reports doing well. Continue to have right shoulder pain, which worsened with raising arms. Denies any chest pain, shortness of breath, abdominal pain Chronic fatigue at baseline .  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 She was seen by Dr. Fransisca Connors 06/19/2019 and a normal pelvic examination.  Review of Systems  Constitutional: Positive for malaise/fatigue. Negative for chills, fever and weight loss.  HENT: Negative for congestion, ear discharge, ear pain, nosebleeds, sinus pain and sore throat.   Eyes: Negative for double vision, photophobia, pain, discharge and redness.  Respiratory: Negative for cough, hemoptysis, sputum production, shortness of breath and wheezing.   Cardiovascular: Negative for chest pain, palpitations, orthopnea, claudication and leg swelling.  Gastrointestinal: Negative for abdominal pain, blood in stool, constipation, diarrhea, heartburn, melena, nausea and vomiting.  Genitourinary: Negative for dysuria, flank pain, frequency and hematuria.  Musculoskeletal: Negative for back pain, myalgias and neck pain.       Right shoulder pain  Skin: Negative for itching and rash.  Neurological: Negative for dizziness, tingling, tremors, focal weakness, weakness and  headaches.       Bilateral foot burning sensation/ Pain improved.   Endo/Heme/Allergies: Negative for environmental allergies. Does not bruise/bleed easily.  Psychiatric/Behavioral: Negative for depression, hallucinations and memory loss. The patient is not nervous/anxious.     MEDICAL HISTORY:  Past Medical History:  Diagnosis Date   Arthritis    Cancer ([place])    endometrial   Endometrial mass    History of chicken pox    History of kidney stones    History of measles    History of mumps    Osteoporosis    PMB (postmenopausal bleeding)    Psoriasis     SURGICAL HISTORY: Past Surgical History:  Procedure Laterality Date   APPENDECTOMY  1960   BREAST SURGERY Left    biopsy   Injection varicose veins in legs Bilateral 2010   Dr. Hulda Humphrey   LITHOTRIPSY  1994, 2004   for renal stones   PORTA CATH INSERTION N/A 04/24/2018   Procedure: PORTA CATH INSERTION;  Surgeon: Katha Cabal, MD;  Location: [facility];  Service: Cardiovascular;  Laterality: N/A;   ROBOTIC ASSISTED TOTAL HYSTERECTOMY Bilateral 04/04/2018   Procedure: ROBOTIC ASSISTED TOTAL HYSTERECTOMY,SENTINEL LYMPH NODE MAPPING AND BIOPSIES,AORTIC LYMPH NODE DISSECTION;  Surgeon: Gillis Ends, MD;  Location: [facility];  Service: Gynecology;  Laterality: Bilateral;   TUBAL LIGATION  1980    SOCIAL HISTORY: Social History   Socioeconomic History   Marital status: Single    Spouse name: Not on file   Number of children: 1   Years of education: Not on file   Highest education level: Not on file  Occupational History   Occupation: Employed    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: Not on file   Food insecurity    Worry: Not on file    Inability: Not on file   Transportation needs    Medical: Not on file    Non-medical: Not on file  Tobacco Use   Smoking status: Former Smoker    Packs/day: 0.25    Types: Cigarettes     Quit date: 12/08/2018    Years since quitting: 0.6   Smokeless tobacco: Never Used  Substance and Sexual Activity   Alcohol use: No    Alcohol/week: 0.0 standard drinks   Drug use: No   Sexual activity: Not Currently  Lifestyle   Physical activity    Days per week: Not on file    Minutes per session: Not on file   Stress: Not on file  Relationships   Social connections    Talks on phone: Not on file    Gets together: Not on file    Attends religious service: Not on file    Active member of club or organization: Not on file    Attends meetings of clubs or organizations: Not on file    Relationship status: Not on file   Intimate partner violence    Fear of current or ex partner: Not on file    Emotionally abused: Not on file    Physically abused: Not on file    Forced sexual activity: Not on file  Other Topics Concern   Not on file  Social History Narrative   Not on file    FAMILY HISTORY: Family History  Problem Relation Age of Onset   Breast cancer Mother    Transient ischemic attack Mother    Arthritis Mother    Hypothyroidism Mother    Lung cancer Mother    Diabetes Father        type 2   Hypertension Father    Arthritis Father    Colon cancer Sister    Prostate cancer Brother    Hyperlipidemia Son    Stomach cancer Maternal Grandmother    Stroke Maternal Grandfather     ALLERGIES:  has No Known Allergies.  MEDICATIONS:  Current Outpatient Medications  Medication Sig Dispense Refill   betamethasone dipropionate (DIPROLENE) 0.05 % cream Apply 1 application topically 2 (two) times daily as needed (for psoriasis on body). 30 g 1   desonide (DESOWEN) 0.05 % cream Apply 1 application topically 2 (two) times daily as needed (for psoriasis on face). 30 g 1   lidocaine-prilocaine (EMLA) cream Apply to affected area once 30 g 3   Multiple Vitamins-Minerals (CENTRUM SILVER ADULT 50+ PO) Take 1 tablet by mouth daily.     naproxen sodium  (ALEVE) 220 MG tablet Take 220 mg by mouth daily as needed.     No current facility-administered medications for this visit.      PHYSICAL EXAMINATION: ECOG PERFORMANCE STATUS: 1 - Symptomatic but completely ambulatory Vitals:   07/17/19 0949  BP: 127/83  Pulse: 68  Temp: 97.8 F (36.6 C)   Filed Weights   07/17/19 0949  Weight: 182 lb (82.6 kg)    Physical Exam Constitutional:      General: She is not in acute distress. HENT:     Head: Normocephalic and atraumatic.     Right Ear: External ear normal.  Eyes:     General: No scleral icterus.    Conjunctiva/sclera: Conjunctivae normal.     Pupils:  Pupils are equal, round, and reactive to light.  Neck:     Musculoskeletal: Normal range of motion and neck supple.  Cardiovascular:     Rate and Rhythm: Normal rate and regular rhythm.     Heart sounds: Normal heart sounds.  Pulmonary:     Effort: Pulmonary effort is normal. No respiratory distress.     Breath sounds: Normal breath sounds. No wheezing or rales.  Chest:     Chest wall: No tenderness.  Abdominal:     General: Bowel sounds are normal. There is no distension.     Palpations: Abdomen is soft. There is no mass.     Tenderness: There is no abdominal tenderness.  Musculoskeletal: Normal range of motion.        General: No deformity.  Lymphadenopathy:     Cervical: No cervical adenopathy.  Skin:    General: Skin is warm and dry.     Findings: No erythema or rash.  Neurological:     Mental Status: She is alert and oriented to person, place, and time.     Cranial Nerves: No cranial nerve deficit.     Coordination: Coordination normal.     Deep Tendon Reflexes: Reflexes normal.  Psychiatric:        Behavior: Behavior normal.        Thought Content: Thought content normal.      LABORATORY DATA:  I have reviewed the data as listed Lab Results  Component Value Date   WBC 6.2 07/17/2019   HGB 13.3 07/17/2019   HCT 40.8 07/17/2019   MCV 91.1 07/17/2019    PLT 198 07/17/2019   Recent Labs    08/22/18 1110 12/04/18 1217 07/17/19 0927  NA 139 140 141  K 4.0 4.0 4.2  CL 105 102 104  CO2 27 22 26   GLUCOSE 93 94 99  BUN 12 16 20   CREATININE 0.68 0.79 0.83  CALCIUM 9.3 9.8 9.5  GFRNONAA >60 79 >60  GFRAA >60 91 >60  PROT 6.9 7.0 7.3  ALBUMIN 3.9 4.2 4.2  AST 22 15 18   ALT 25 12 15   ALKPHOS 56 77 65  BILITOT 0.4 0.3 0.6   Iron/TIBC/Ferritin/ %Sat No results found for: IRON, TIBC, FERRITIN, IRONPCTSAT   RADIOGRAPHIC STUDIES: I have personally reviewed the radiological images as listed and agreed with the findings in the report. Ct Chest Wo Contrast  Result Date: 07/12/2019 CLINICAL DATA:  Follow-up pulmonary nodule. Personal history of endometrial carcinoma. Former smoker. EXAM: CT CHEST WITHOUT CONTRAST TECHNIQUE: Multidetector CT imaging of the chest was performed following the standard protocol without IV contrast. COMPARISON:  Most recent exam on 03/12/2019, and older studies dating back to 03/30/2018 FINDINGS: Cardiovascular:  No acute findings. Mediastinum/Nodes: No masses or pathologically enlarged lymph nodes identified on this unenhanced exam. Lungs/Pleura: Multiple scattered sub-cm pulmonary nodules are again seen throughout both lungs. Vast majority are stable, however 2 nodules in right upper lobe show increase in size compared to previous studies. These nodules in the right upper lobe currently measure 7 mm on image 32/4, new since earliest exam with 07/11/2018, and 6 mm on image 62/4, increased from 3 mm since 07/11/2018. Largest left lung nodule in the lower lobe measures 8 mm on image 117/4, without change compared to prior studies no evidence of pulmonary consolidation or pleural effusion. Upper Abdomen: Multiple hepatic cysts and tiny bilateral renal calculi again noted. Musculoskeletal:  No suspicious bone lesions. IMPRESSION: 1. Multiple sub-cm bilateral pulmonary nodules, majority of which  are stable, however 2 nodules in  right upper lobe have increased in size compared to prior studies. Pulmonary metastases cannot be excluded. 2. No evidence of lymphadenopathy or pleural effusion. Electronically Signed   By: Marlaine Hind M.D.   On: 07/12/2019 13:47     ASSESSMENT & PLAN:  1. Endometrial cancer ([place])   2. Pain in joint of right shoulder   3. Port-A-Cath in place   4. Solitary pulmonary nodule   Stage IA Uterine carcinosarcoma  Status post 6 cycles adjuvant carboplatinum AUC 5 and Taxol 135 mg/m. Clinically doing well.  Continue follow-up with GYN oncology. Image surveillance as clinical indicated.  #Lung nodules Patient has multiple subcentimeter bilateral pulmonary nodules.  CT scan chest without was independently reviewed and discussed with patient.  Most of the multiple subcentimeter bilateral pulmonary nodules have been stable.  2 nodules in the right upper lobe have increased in size compared to prior studies, 7 mm and 6 mm. Discussed with patient that lung nodules are very common in smokers.  Recommend smoke cessation. Lung nodules need to be monitored.  Recommend CT chest without contrast in 3 months.  #Pain of the right shoulder joint, likely degenerative disease.  Patient takes ibuprofen as needed for pain. Obtain right shoulder x-ray. #Port-A-Cath in place, continue port flush every 6 to 8 weeks.  We spent sufficient time to discuss many aspect of care, questions were answered to patient's satisfaction. The patient knows to call the clinic with any problems questions or concerns.  Return of visit: 3 months after CT scan.  Orders Placed This Encounter  Procedures   CT CHEST WO CONTRAST    Standing Status:   Future    Standing Expiration Date:   07/16/2020    Order Specific Question:   ** REASON FOR EXAM (FREE TEXT)    Answer:   Follow-up pulmonary nodule. Personal history of endometrial carcinoma. Former smoker    Environmental health practitioner Question:   Preferred imaging location?    Answer:   [facility]    Order Specific Question:   Radiology Contrast Protocol - do NOT remove file path    Answer:   \[email or web address]   DG Shoulder Right    Standing Status:   Future    Standing Expiration Date:   09/15/2020    Order Specific Question:   Reason for Exam (SYMPTOM  OR DIAGNOSIS REQUIRED)    Answer:   right shoulder pain    Order Specific Question:   Preferred imaging location?    Answer:   [facility]    Order Specific Question:   Radiology Contrast Protocol - do NOT remove file path    Answer:   [email or web address]   CBC with Differential/Platelet    Standing Status:   Future    Standing Expiration Date:   07/16/2020   Comprehensive metabolic panel    Standing Status:   Future    Standing Expiration Date:   07/16/2020   CA 125    Standing Status:   Future    Standing Expiration Date:   07/16/2020     Earlie Server, MD, PhD Hematology Oncology [place] [facility] Pager- [phone] 07/17/2019

## 2019-07-18 LAB — CA 125: Cancer Antigen (CA) 125: 12.2 U/mL (ref 0.0–38.1)

## 2019-07-19 ENCOUNTER — Ambulatory Visit
Admission: RE | Admit: 2019-07-19 | Discharge: 2019-07-19 | Disposition: A | Discharge status: Home / Self Care | Payer: Medicare Other | Source: Ambulatory Visit | Attending: Oncology | Admitting: Oncology

## 2019-07-19 ENCOUNTER — Other Ambulatory Visit: Payer: Self-pay

## 2019-07-19 ENCOUNTER — Ambulatory Visit
Admission: RE | Admit: 2019-07-19 | Discharge: 2019-07-19 | Disposition: A | Discharge status: Home / Self Care | Payer: Medicare Other | Attending: Oncology | Admitting: Oncology

## 2019-07-19 DIAGNOSIS — M25511 Pain in right shoulder: Secondary | ICD-10-CM | POA: Diagnosis not present

## 2019-08-12 ENCOUNTER — Other Ambulatory Visit: Payer: Self-pay

## 2019-08-12 ENCOUNTER — Inpatient Hospital Stay: Payer: Medicare Other | Attending: Oncology

## 2019-08-12 DIAGNOSIS — C541 Malignant neoplasm of endometrium: Secondary | ICD-10-CM | POA: Diagnosis present

## 2019-08-12 DIAGNOSIS — Z95828 Presence of other vascular implants and grafts: Secondary | ICD-10-CM

## 2019-08-12 DIAGNOSIS — Z452 Encounter for adjustment and management of vascular access device: Secondary | ICD-10-CM | POA: Diagnosis not present

## 2019-08-12 MED ORDER — HEPARIN SOD (PORK) LOCK FLUSH 100 UNIT/ML IV SOLN
500.0000 [IU] | Freq: Once | INTRAVENOUS | Status: AC
  Administered 2019-08-12: 500 [IU] via INTRAVENOUS

## 2019-08-12 MED ORDER — SODIUM CHLORIDE 0.9% FLUSH
10.0000 mL | Freq: Once | INTRAVENOUS | Status: AC
  Administered 2019-08-12: 10 mL via INTRAVENOUS
  Filled 2019-08-12: qty 10

## 2019-10-14 ENCOUNTER — Other Ambulatory Visit: Payer: Self-pay

## 2019-10-14 ENCOUNTER — Ambulatory Visit
Admission: RE | Admit: 2019-10-14 | Discharge: 2019-10-14 | Disposition: A | Discharge status: Home / Self Care | Payer: Medicare Other | Source: Ambulatory Visit | Attending: Oncology | Admitting: Oncology

## 2019-10-14 DIAGNOSIS — C541 Malignant neoplasm of endometrium: Secondary | ICD-10-CM | POA: Diagnosis not present

## 2019-10-14 DIAGNOSIS — R918 Other nonspecific abnormal finding of lung field: Secondary | ICD-10-CM | POA: Diagnosis not present

## 2019-10-16 ENCOUNTER — Encounter: Payer: Self-pay | Admitting: Oncology

## 2019-10-16 ENCOUNTER — Inpatient Hospital Stay (HOSPITAL_BASED_OUTPATIENT_CLINIC_OR_DEPARTMENT_OTHER): Payer: Medicare Other | Admitting: Oncology

## 2019-10-16 ENCOUNTER — Inpatient Hospital Stay: Payer: Medicare Other | Attending: Oncology | Admitting: *Deleted

## 2019-10-16 ENCOUNTER — Other Ambulatory Visit: Payer: Self-pay

## 2019-10-16 VITALS — BP 135/85 | HR 67 | Temp 98.6°F | Resp 18 | Wt 188.3 lb

## 2019-10-16 DIAGNOSIS — R911 Solitary pulmonary nodule: Secondary | ICD-10-CM

## 2019-10-16 DIAGNOSIS — Z87891 Personal history of nicotine dependence: Secondary | ICD-10-CM | POA: Diagnosis not present

## 2019-10-16 DIAGNOSIS — E041 Nontoxic single thyroid nodule: Secondary | ICD-10-CM

## 2019-10-16 DIAGNOSIS — C541 Malignant neoplasm of endometrium: Secondary | ICD-10-CM

## 2019-10-16 DIAGNOSIS — Z95828 Presence of other vascular implants and grafts: Secondary | ICD-10-CM | POA: Diagnosis not present

## 2019-10-16 LAB — COMPREHENSIVE METABOLIC PANEL
ALT: 15 U/L (ref 0–44)
AST: 17 U/L (ref 15–41)
Albumin: 4 g/dL (ref 3.5–5.0)
Alkaline Phosphatase: 64 U/L (ref 38–126)
Anion gap: 7 (ref 5–15)
BUN: 18 mg/dL (ref 8–23)
CO2: 26 mmol/L (ref 22–32)
Calcium: 8.9 mg/dL (ref 8.9–10.3)
Chloride: 106 mmol/L (ref 98–111)
Creatinine, Ser: 0.78 mg/dL (ref 0.44–1.00)
GFR calc Af Amer: >60 mL/min (ref >60)
GFR calc non Af Amer: >60 mL/min (ref >60)
Glucose, Bld: 98 mg/dL (ref 70–99)
Potassium: 3.6 mmol/L (ref 3.5–5.1)
Sodium: 139 mmol/L (ref 135–145)
Total Bilirubin: 0.7 mg/dL (ref 0.3–1.2)
Total Protein: 7.2 g/dL (ref 6.5–8.1)

## 2019-10-16 LAB — CBC WITH DIFFERENTIAL/PLATELET
Abs Immature Granulocytes: 0.02 10*3/uL (ref 0.00–0.07)
Basophils Absolute: 0.1 10*3/uL (ref 0.0–0.1)
Basophils Relative: 1 %
Eosinophils Absolute: 0.1 10*3/uL (ref 0.0–0.5)
Eosinophils Relative: 1 %
HCT: 39.4 % (ref 36.0–46.0)
Hemoglobin: 12.6 g/dL (ref 12.0–15.0)
Immature Granulocytes: 0 %
Lymphocytes Relative: 21 %
Lymphs Abs: 1.6 10*3/uL (ref 0.7–4.0)
MCH: 29.4 pg (ref 26.0–34.0)
MCHC: 32 g/dL (ref 30.0–36.0)
MCV: 92.1 fL (ref 80.0–100.0)
Monocytes Absolute: 0.6 10*3/uL (ref 0.1–1.0)
Monocytes Relative: 8 %
Neutro Abs: 5.3 10*3/uL (ref 1.7–7.7)
Neutrophils Relative %: 69 %
Platelets: 215 10*3/uL (ref 150–400)
RBC: 4.28 MIL/uL (ref 3.87–5.11)
RDW: 12.5 % (ref 11.5–15.5)
WBC: 7.7 10*3/uL (ref 4.0–10.5)
nRBC: 0 % (ref 0.0–0.2)

## 2019-10-16 MED ORDER — LIDOCAINE-PRILOCAINE 2.5-2.5 % EX CREA: TOPICAL_CREAM | CUTANEOUS | 3 refills | Status: DC

## 2019-10-16 MED ORDER — HEPARIN SOD (PORK) LOCK FLUSH 100 UNIT/ML IV SOLN
500.0000 [IU] | Freq: Once | INTRAVENOUS | Status: AC
  Administered 2019-10-16: 500 [IU] via INTRAVENOUS

## 2019-10-16 MED ORDER — SODIUM CHLORIDE 0.9% FLUSH
10.0000 mL | Freq: Once | INTRAVENOUS | Status: AC
  Administered 2019-10-16: 10 mL via INTRAVENOUS
  Filled 2019-10-16: qty 10

## 2019-10-16 NOTE — Progress Notes (Signed)
Patient here for follow up. No concerns voiced. Pt requesting refill on lidocaine cream. I called [facility] to check if there were refills left but they said that a new prescription had to be sent in because old presc had expired. New refill request sent.

## 2019-10-16 NOTE — Progress Notes (Signed)
Hematology/Oncology Follow up  note [facility] [phone] [phone]   Patient Care Team: Chrismon, Vickki Muff, PA as PCP - General (Family Medicine) Mellody Drown, MD as Referring Physician (Obstetrics and Gynecology) Gillis Ends, MD as Referring Physician (Obstetrics and Gynecology) Clent Jacks, RN as Registered Nurse Noreene Filbert, MD as Referring Physician (Radiation Oncology) Earlie Server, MD as Consulting Physician (Oncology) Schnier, Dolores Lory, MD (Vascular Surgery) REASON FOR VISIT Follow up for treatment of uterus carcinosarcoma  HISTORY OF PRESENTING ILLNESS:  Katherine Snyder is a  68 y.o.  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 Pathology showed  A uterus with cervix hysterectomy -Carcinosarcoma B sentinel lymph node, right primary obturator negative C lymph node right external iliac lymph negative D fallopian tube and ovary, unilateral right negative E sentinel lymph node right low periaortic: Negative F sentinel lymph node left low periaortic negative  CANCER CASE SUMMARY: ENDOMETRIUM  Procedure: Total hysterectomy and bilateral salpingo-oophorectomy  Histologic Type: Carcinosarcoma  Histologic Grade: Not applicable  Myometrial Invasion: Present    Depth of myometrial invasion cannot be determined due to exophytic  tumor growth and effacement of endometrial/myometrial junction    Myometrial thickness: At least 8 mm    Percentage depth of myometrial invasion: Estimated less than 50%  Uterine Serosa Involvement: Not identified  Cervical Stromal Involvement: Not identified  Other Tissue/Organ Involvement: Not identified  Peritoneal/ Ascitic Fluid: Negative for malignancy  Lymphovascular Invasion: Present   Regional Lymph Nodes: All lymph nodes negative for tumor cells  Number of Lymph Nodes Examined: 5    Total number of pelvic nodes examined: 2    Number of pelvic sentinel nodes examined: 1    Total number of para-aortic nodes examined: 3    Number of para-aortic sentinel nodes examined: 3   Pathologic Stage Classification (pTNM, AJCC 8th Edition) (Note J): pT1a  pN0/FIGO IA   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 Status post adjuvant vaginal brachi therapy.   Pertinent oncology history Katherine Snyder is a 68 y.o. female who has above history reviewed by me today presents for assessment prior to Cycle 6 adjuvant chemotherapy management for stage Ia endometrial carcinosarcoma. During cycle 1 treatment, reported feeling burning down and having really bad heartburn.  Taxol was temporarily stopped.  The patient was given Benadryl, Solu-Medrol, Pepcid, symptoms improved and resolved.  Taxol was restarted and patient is able to finish her treatment. Patient reports feeling extremely tired for 2 days after chemotherapy.  She was seen by nurse practitioner Sonia Baller during interval.   She complained burning sensation with urination and had UA and urine culture obtained.  UA was not convincing for UTI urine culture showed less than 10,000 colonies of insignificant growth.  Patient remains afebrile. Patient also reports her chronic fasciitis of foot has got a lot worse since the start of chemotherapy.  Possible neuropathy was discussed and the patient is reluctant to start gabapentin or Cymbalta.  She was given tramadol 100 mg twice daily.  Patient reports symptoms gradually improved.    Patient was found to have neutropenia so she received Neupogen x 2. She reports bone pain started after Neupogen shots.   Marland Kitchen#Lung nodules #  12/10/2018- CT Chest wo  contrast -New sub solid density measuring 12 mm with associated 3 mm solid nodule is noted in left upper lobe.Stable sub solid density is noted in right upper lobe with grossly stable 4 mm solid nodule. Multiple other pulmonary nodules are noted in both lungs which are stable compared to prior exam  Case was discussed on tumor board and recommend surveillance CT in 3 months.  Patient had follow-up CT chest done and present for discussion of CT results.   INTERVAL HISTORY Katherine Snyder is a 68 y.o. female who has above history reviewed by me presents for assessment prior to Cycle 6 adjuvant chemotherapy for stage Ia endometrial carcinosarcoma.  Problems and complaints are listed below: Patient reports feeling well. Denies any chest pain, shortness of breath, abdominal pain.  Chronic fatigue is at baseline. Intermittent neuropathy, bilateral toes and fingertips.  Stable.    She was seen by Dr. Fransisca Connors 06/19/2019 and a normal pelvic examination.  Review of Systems  Constitutional: Positive for malaise/fatigue. Negative for chills, fever and weight loss.  HENT: Negative for congestion, ear discharge, ear pain, nosebleeds, sinus pain and sore throat.   Eyes: Negative for double vision, photophobia, pain, discharge and redness.  Respiratory: Negative for cough, hemoptysis, sputum production, shortness of breath and wheezing.   Cardiovascular: Negative for chest pain, palpitations, orthopnea, claudication and leg swelling.  Gastrointestinal: Negative for abdominal pain, blood in stool, constipation, diarrhea, heartburn, melena, nausea and vomiting.  Genitourinary: Negative for dysuria, flank pain, frequency and hematuria.  Musculoskeletal: Negative for back pain, myalgias and neck pain.  Skin: Negative for itching and rash.  Neurological: Negative for dizziness, tingling, tremors, focal weakness, weakness and headaches.       Bilateral foot burning sensation/ Pain improved.    Endo/Heme/Allergies: Negative for environmental allergies. Does not bruise/bleed easily.  Psychiatric/Behavioral: Negative for depression, hallucinations and memory loss. The patient is not nervous/anxious.     MEDICAL HISTORY:  Past Medical History:  Diagnosis Date  . Arthritis   . Cancer [facility])    endometrial  . Endometrial mass   . History of chicken pox   . History of kidney stones   . History of measles   . History of mumps   . Osteoporosis   . PMB (postmenopausal bleeding)   . Psoriasis     SURGICAL HISTORY: Past Surgical History:  Procedure Laterality Date  . APPENDECTOMY  1960  . BREAST SURGERY Left    biopsy  . Injection varicose veins in legs Bilateral 2010   Dr. Hulda Humphrey  . LITHOTRIPSY  1994, 2004   for renal stones  . PORTA CATH INSERTION N/A 04/24/2018   Procedure: PORTA CATH INSERTION;  Surgeon: Katha Cabal, MD;  Location: [facility];  Service: Cardiovascular;  Laterality: N/A;  . ROBOTIC ASSISTED TOTAL HYSTERECTOMY Bilateral 04/04/2018   Procedure: ROBOTIC ASSISTED TOTAL HYSTERECTOMY,SENTINEL LYMPH NODE MAPPING AND BIOPSIES,AORTIC LYMPH NODE DISSECTION;  Surgeon: Gillis Ends, MD;  Location: [facility];  Service: Gynecology;  Laterality: Bilateral;  . TUBAL LIGATION  1980    SOCIAL HISTORY: Social History   Socioeconomic History  . Marital status: Single    Spouse name: Not on file  . Number of children: 1  . Years of education: Not on file  . Highest education level: Not on file  Occupational History  . Occupation: Employed    Comment: Works at a [employer] doing [employer]  Social Needs  . Financial resource strain: Not on file  .  Food insecurity    Worry: Not on file    Inability: Not on file  . Transportation needs    Medical: Not on file    Non-medical: Not on file  Tobacco Use  . Smoking status: Former Smoker    Packs/day: 0.25    Types: Cigarettes    Quit date: 12/08/2018    Years since quitting: 0.8  . Smokeless  tobacco: Never Used  Substance and Sexual Activity  . Alcohol use: No    Alcohol/week: 0.0 standard drinks  . Drug use: No  . Sexual activity: Not Currently  Lifestyle  . Physical activity    Days per week: Not on file    Minutes per session: Not on file  . Stress: Not on file  Relationships  . Social Herbalist on phone: Not on file    Gets together: Not on file    Attends religious service: Not on file    Active member of club or organization: Not on file    Attends meetings of clubs or organizations: Not on file    Relationship status: Not on file  . Intimate partner violence    Fear of current or ex partner: Not on file    Emotionally abused: Not on file    Physically abused: Not on file    Forced sexual activity: Not on file  Other Topics Concern  . Not on file  Social History Narrative  . Not on file    FAMILY HISTORY: Family History  Problem Relation Age of Onset  . Breast cancer Mother   . Transient ischemic attack Mother   . Arthritis Mother   . Hypothyroidism Mother   . Lung cancer Mother   . Diabetes Father        type 2  . Hypertension Father   . Arthritis Father   . Colon cancer Sister   . Prostate cancer Brother   . Hyperlipidemia Son   . Stomach cancer Maternal Grandmother   . Stroke Maternal Grandfather     ALLERGIES:  has No Known Allergies.  MEDICATIONS:  Current Outpatient Medications  Medication Sig Dispense Refill  . betamethasone dipropionate (DIPROLENE) 0.05 % cream Apply 1 application topically 2 (two) times daily as needed (for psoriasis on body). 30 g 1  . desonide (DESOWEN) 0.05 % cream Apply 1 application topically 2 (two) times daily as needed (for psoriasis on face). 30 g 1  . Multiple Vitamins-Minerals (CENTRUM SILVER ADULT 50+ PO) Take 1 tablet by mouth daily.    . naproxen sodium (ALEVE) 220 MG tablet Take 220 mg by mouth daily as needed.    . lidocaine-prilocaine (EMLA) cream Apply to affected area once 30 g 3    No current facility-administered medications for this visit.      PHYSICAL EXAMINATION: ECOG PERFORMANCE STATUS: 1 - Symptomatic but completely ambulatory Vitals:   10/16/19 1003  BP: 135/85  Pulse: 67  Resp: 18  Temp: 98.6 F (37 C)   Filed Weights   10/16/19 1003  Weight: 188 lb 4.8 oz (85.4 kg)    Physical Exam Constitutional:      General: She is not in acute distress. HENT:     Head: Normocephalic and atraumatic.     Right Ear: External ear normal.  Eyes:     General: No scleral icterus.    Conjunctiva/sclera: Conjunctivae normal.     Pupils: Pupils are equal, round, and reactive to light.  Neck:  Musculoskeletal: Normal range of motion and neck supple.  Cardiovascular:     Rate and Rhythm: Normal rate and regular rhythm.     Heart sounds: Normal heart sounds.  Pulmonary:     Effort: Pulmonary effort is normal. No respiratory distress.     Breath sounds: Normal breath sounds. No wheezing or rales.  Chest:     Chest wall: No tenderness.  Abdominal:     General: Bowel sounds are normal. There is no distension.     Palpations: Abdomen is soft. There is no mass.     Tenderness: There is no abdominal tenderness.  Musculoskeletal: Normal range of motion.        General: No deformity.  Lymphadenopathy:     Cervical: No cervical adenopathy.  Skin:    General: Skin is warm and dry.     Findings: No erythema or rash.  Neurological:     Mental Status: She is alert and oriented to person, place, and time.     Cranial Nerves: No cranial nerve deficit.     Coordination: Coordination normal.     Deep Tendon Reflexes: Reflexes normal.  Psychiatric:        Behavior: Behavior normal.        Thought Content: Thought content normal.      LABORATORY DATA:  I have reviewed the data as listed Lab Results  Component Value Date   WBC 7.7 10/16/2019   HGB 12.6 10/16/2019   HCT 39.4 10/16/2019   MCV 92.1 10/16/2019   PLT 215 10/16/2019   Recent Labs    12/04/18  1217 07/17/19 0927 10/16/19 0947  NA 140 141 139  K 4.0 4.2 3.6  CL 102 104 106  CO2 22 26 26   GLUCOSE 94 99 98  BUN 16 20 18   CREATININE 0.79 0.83 0.78  CALCIUM 9.8 9.5 8.9  GFRNONAA 79 >60 >60  GFRAA 91 >60 >60  PROT 7.0 7.3 7.2  ALBUMIN 4.2 4.2 4.0  AST 15 18 17   ALT 12 15 15   ALKPHOS 77 65 64  BILITOT 0.3 0.6 0.7   Iron/TIBC/Ferritin/ %Sat No results found for: IRON, TIBC, FERRITIN, IRONPCTSAT   RADIOGRAPHIC STUDIES: I have personally reviewed the radiological images as listed and agreed with the findings in the report. Dg Shoulder Right  Result Date: 07/19/2019 CLINICAL DATA:  68 year old female with right shoulder pain EXAM: RIGHT SHOULDER - 2+ VIEW COMPARISON:  None. FINDINGS: No acute displaced fracture. Glenohumeral joint appears congruent. Degenerative changes of the acromioclavicular joint. Unremarkable scapular Y-view. Port catheter in place. IMPRESSION: Negative for acute bony abnormality. Degenerative changes of the acromioclavicular joint. Electronically Signed   By: Corrie Mckusick D.O.   On: 07/19/2019 15:50   Ct Chest Wo Contrast  Result Date: 10/14/2019 CLINICAL DATA:  Follow up pulmonary nodules. History endometrial carcinosarcoma. EXAM: CT CHEST WITHOUT CONTRAST TECHNIQUE: Multidetector CT imaging of the chest was performed following the standard protocol without IV contrast. COMPARISON:  Prior CTs ranging from 07/21/2018 through 07/12/2019. FINDINGS: Cardiovascular: Minimal great vessel atherosclerosis. No acute vascular findings on noncontrast imaging. Right IJ Port-A-Cath extends to the superior cavoatrial junction. The heart size is normal. There is no pericardial effusion. Mediastinum/Nodes: There are no enlarged mediastinal, hilar or axillary lymph nodes.Heterogeneous left thyroid nodule measuring 17 x 15 mm on image 12/2 appears similar to available prior studies. The trachea and esophagus appear normal. Lungs/Pleura: There is no pleural effusion. Multiple  small pulmonary nodules are again noted bilaterally. The 2 new right  upper lobe nodule seen on the most recent study have resolved. Part solid right upper lobe nodule with a 3 mm central solid component on image 31/3 is stable. There is a stable 7 mm right upper lobe nodule (image 39/3) and a stable 7 mm left lower lobe nodule (121/3). No new or enlarging nodules. Stable mild biapical scarring. Upper abdomen: The visualized upper abdomen appears stable without suspicious findings. There are multiple hepatic cysts. Musculoskeletal/Chest wall: There is no chest wall mass or suspicious osseous finding. Stable small bone island in the right 7th rib laterally. IMPRESSION: 1. The 2 new right upper lobe nodules seen on the most recent study have resolved, consistent with a benign etiology. 2. The additional small nodules bilaterally are unchanged from the oldest available study of 07/11/2018. No new or enlarging nodules. 3. Left thyroid nodule also appears stable from the 07/11/2018 study. Consider thyroid ultrasound if not previously performed.(Ref: J Am Coll Radiol. 2015 Feb;12(2): 143-50). 4. Aortic Atherosclerosis (ICD10-I70.0). Electronically Signed   By: Richardean Sale M.D.   On: 10/14/2019 10:54     ASSESSMENT & PLAN:  1. Endometrial cancer ([place])   2. Port-A-Cath in place   3. Solitary pulmonary nodule   4. Thyroid nodule   Stage IA Uterine carcinosarcoma  Status post 6 cycles adjuvant carboplatinum AUC 5 and Taxol 135 mg/m. Patient is clinically doing well.  Continue follow-up with GYN oncology/pelvic exam.. Image surveillance as indicated by symptoms.  #Lung nodules 10/14/2019 CT chest without contrast was independently reviewed by me discussed with patient The 2 new right upper nodules have resolved.  Additional small nodules bilaterally unchanged.  #Left thyroid nodule, stable from 07/11/2018.  Recommend patient to discuss with primary care provider and obtain thyroid ultrasound for further  evaluation. #Port-A-Cath in place, continue port flush every 6 to 8 weeks.  Recommend patient to keep port for 2 years.  We spent sufficient time to discuss many aspect of care, questions were answered to patient's satisfaction. The patient knows to call the clinic with any problems questions or concerns.  Return of visit: 6 months Orders Placed This Encounter  Procedures  . CBC with Differential    Standing Status:   Future    Standing Expiration Date:   10/15/2020  . Comprehensive metabolic panel    Standing Status:   Future    Standing Expiration Date:   10/15/2020  . CA 125    Standing Status:   Future    Standing Expiration Date:   10/15/2020     Earlie Server, MD, PhD Hematology Oncology [facility] Pager- [phone] 10/16/2019

## 2019-12-05 ENCOUNTER — Inpatient Hospital Stay: Payer: Medicare Other | Attending: Oncology

## 2019-12-05 ENCOUNTER — Other Ambulatory Visit: Payer: Self-pay

## 2019-12-05 DIAGNOSIS — C541 Malignant neoplasm of endometrium: Secondary | ICD-10-CM | POA: Insufficient documentation

## 2019-12-05 DIAGNOSIS — Z95828 Presence of other vascular implants and grafts: Secondary | ICD-10-CM

## 2019-12-05 DIAGNOSIS — Z452 Encounter for adjustment and management of vascular access device: Secondary | ICD-10-CM | POA: Diagnosis not present

## 2019-12-05 MED ORDER — HEPARIN SOD (PORK) LOCK FLUSH 100 UNIT/ML IV SOLN
500.0000 [IU] | Freq: Once | INTRAVENOUS | Status: AC
  Administered 2019-12-05: 500 [IU] via INTRAVENOUS
  Filled 2019-12-05: qty 5

## 2019-12-05 MED ORDER — SODIUM CHLORIDE 0.9% FLUSH
10.0000 mL | Freq: Once | INTRAVENOUS | Status: AC
  Administered 2019-12-05: 10 mL via INTRAVENOUS
  Filled 2019-12-05: qty 10

## 2019-12-18 ENCOUNTER — Inpatient Hospital Stay: Payer: Medicare Other | Attending: Obstetrics and Gynecology | Admitting: Obstetrics and Gynecology

## 2019-12-18 ENCOUNTER — Other Ambulatory Visit: Payer: Self-pay

## 2019-12-18 VITALS — BP 131/57 | HR 64 | Temp 97.6°F | Resp 16 | Wt 189.9 lb

## 2019-12-18 DIAGNOSIS — Z923 Personal history of irradiation: Secondary | ICD-10-CM | POA: Insufficient documentation

## 2019-12-18 DIAGNOSIS — Z90722 Acquired absence of ovaries, bilateral: Secondary | ICD-10-CM | POA: Insufficient documentation

## 2019-12-18 DIAGNOSIS — C541 Malignant neoplasm of endometrium: Secondary | ICD-10-CM | POA: Diagnosis present

## 2019-12-18 DIAGNOSIS — Z87891 Personal history of nicotine dependence: Secondary | ICD-10-CM | POA: Insufficient documentation

## 2019-12-18 DIAGNOSIS — Z90710 Acquired absence of both cervix and uterus: Secondary | ICD-10-CM | POA: Insufficient documentation

## 2019-12-18 DIAGNOSIS — Z9221 Personal history of antineoplastic chemotherapy: Secondary | ICD-10-CM | POA: Insufficient documentation

## 2019-12-18 NOTE — Progress Notes (Signed)
Gynecologic Oncology Interval Visit   Referring Provider: Dr. Amalia Hailey  Chief Complaint: Endometrial Cancer, carcinosarcoma  Subjective:  Katherine Snyder is a 69 y.o. 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    Overall today she feels well and denies specific complaints. No vaginal bleeding, pain, changes in bowel habits. She stopped smoking in 2/20 but occasionally uses vape products.   Last mammogram was 08/2016. Last bone density 12/2014. She has never had a colonoscopy. Dr. Tasia Catchings recommended thyroid ultrasound with PCP which has not yet been done.   GYNECOLOGIC ONCOLOGY HISTORY:  Initially, patient presented to ER on 02/26/18 for PMB. She was reportedly 14 years post menopausal and had had a 2 day episode of vaginal bleeding that was constant.   02/26/18- Ultrasound Pelvic Complete w/ Transvaginal: Endometrium thickness up to 45mm. No focal solid or cystic change. IMPRESSION: Mildly enlarged uterus. No discrete fibroids. Markedly thickened endometrium. In the setting of post-menopausal bleeding, endometrial sampling is indicated to exclude carcinoma. If results are benign, sonohysterogram should be considered for focal lesion work-up. (Ref: Radiological Reasoning: Algorithmic Workup of Abnormal Vaginal Bleeding with Endovaginal Sonography and Sonohysterography. AJR 2008; [id number]) Nonvisualization of the right ovary. Normal-appearing left ovary. No significant uterine fibroids were observed.  She was seen by Dr. Ellard Artis son 03/01/18 who performed vacurette endometrial biopsy. Uterus was sounded to length to 9 cm. She was started on Aygestin and bleeding stopped.   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   COMMENT: BASED ON INITIAL HISTOLOGIC FINDINGS, IMMUNOHISTOCHEMISTRY IS EVALUATED:  THE SARCOMATOUS TUMOR COMPONENT IS DIFFUSELY POSITIVE FOR CD10 AND THE CARCINOMA COMPONENT IS POSITIVE FOR PANKERATIN, SUPPORTING THE DIAGNOSIS.   She was seen by Dr. Fransisca Connors, Gyn-Onc on 03/28/2018 for initial evaluation and treatment planning.  TLH BSO with sentinel lymph node mapping and biopsies and aortic lymph node dissection were discussed.  Surgery at [facility] was recommended but unfortunately, patient's insurance will not cover.   CA 125- 10.5  03/30/18 - CT C/A/P W CONTRAST IMPRESSION: 1. Mass like expansion of the endometrial cavity known primary endometrial carcinoma. 2. No evidence for nodal metastasis or solid organ metastasis within the abdomen or pelvis. 3. Scattered nonspecific solid nodules and a single part solid nodule identified in both lungs. Consensus criteria recommendations for nodule followup does not apply to patients with known primary malignancy. 4. Liver cysts.  She underwent CT Imaging and then robot assisted total hysterectomy with sentinel LN mapping, biopsies, and aortic LN dissection with Dr. Theora Gianotti at [facility] 04/04/18.   A. UTERUS WITH CERVIX; HYSTERECTOMY:  - CARCINOSARCOMA.  - MYOMETRIAL INVASION PRESENT, LESS THAN HALF OF THE MYOMETRIUM.  - NEGATIVE FOR CERVICAL AND SEROSAL INVOLVEMENT.   FALLOPIAN TUBE AND OVARY, LEFT; SALPINGO-OOPHORECTOMY:  - NEGATIVE FOR MALIGNANCY.   B. SENTINEL LYMPH NODE, RIGHT PRIMARY OBTURATOR; EXCISION:  - NEGATIVE FOR MALIGNANCY, ONE LYMPH NODE (0/1).   C. LYMPH NODE, RIGHT EXTERNAL ILIAC VEIN; EXCISION:  - NEGATIVE FOR MALIGNANCY, ONE LYMPH NODE (0/1).   D. FALLOPIAN TUBE AND OVARY, UNILATERAL (RIGHT); SALPINGO-OOPHORECTOMY:  - NEGATIVE FOR MALIGNANCY.   E. SENTINEL LYMPH NODE, RIGHT LOW PARA-AORTIC; EXCISION:  - NEGATIVE FOR MALIGNANCY, TWO LYMPH NODES (0/2).   F. SENTINEL LYMPH NODE, LEFT PRIMARY LOW PARA-AORTIC; EXCISION:  - NEGATIVE FOR  MALIGNANCY, ONE LYMPH  NODE (0/1).   DIAGNOSIS:  A. PELVIC WASHINGS:  - NEGATIVE FOR MALIGNANCY.    Her case was discussed at [place] and discussed that recurrence risk without adjuvant treatment is up to 50%.  So recommend adjuvant chemo and radiation.  She was seen by Dr. Baruch Gouty, for initial evaluation of stage IA uterine carcinosarcoma and plan is for vaginal brachytherapy after 6 cycles of carbo/taxol.    She initiated carbo-Taxol on 04/25/2018.  Completed 6 cycles on 08/08/2018.  07/11/18- CT Chest WO Contrast for follow-up on pulmonary nodules IMPRESSION: 1. Previously visualized pulmonary nodules are either stable or resolved. Several new subcentimeter bilateral pulmonary nodules. Pulmonary nodularity generally appears centrilobular in distribution and while considered indeterminate is more suggestive of infectious/inflammatory nodularity. Continued chest CT surveillance is recommended in 3-6 months. 2. No thoracic adenopathy.  12/10/2018- CT Chest wo contrast - New sub solid density measuring 12 mm with associated 3 mm solid nodule is noted in left upper lobe. There also noted several other new nodules noted more posteriorly in the left upper lobe, with the largest measuring 3 mm. These may simply be inflammatory in etiology, but neoplasm can not be excluded. Initial follow-up by chest CT without contrast is recommended in 3 months to confirm persistence. - Stable sub solid density is noted in right upper lobe with grossly stable 4 mm solid nodule. Multiple other pulmonary nodules are noted in both lungs which are stable compared to prior exam - Small nonobstructive right renal calculus  She received her 6th cycle of carbo-taxol on 08/08/2018.  She received vaginal brachytherapy with Dr. Baruch Gouty 10/19-11/09/2018. She had indeterminate pulmonary nodules on imaging. Interval imaging as below:   CT scan chest 5/20 IMPRESSION: 1. Stable scattered pulmonary nodules as detailed above  including a 1.2 cm part solid nodule in the right upper lobe. The solid component remains stable at approximately 0.4 cm. Follow-up non-contrast CT recommended at 3-6 months to confirm persistence. If unchanged, and solid component remains <6 mm, annual CT is recommended until 5 years of stability has been established. If persistent these nodules should be considered highly suspicious if the solid component of the nodule is 6 mm or greater in size and enlarging. This recommendation follows the consensus statement: Guidelines for Management of Incidental Pulmonary Nodules Detected on CT Images: From the Fleischner Society 2017; Radiology 2017; 284:228-243. 2. Additional scattered pulmonary nodules as detailed above measuring up to approximately 8 mm. These are stable from prior study. Attention on yearly follow-up examinations is recommended.  3. Bilateral nonobstructing renal nephroliths are again noted. 4. Mild bilateral interlobular septal thickening consistent with mild volume overload. 07/12/2019- CT Chest WO Contrast IMPRESSION: 1. Multiple sub-cm bilateral pulmonary nodules, majority of which are stable, however 2 nodules in right upper lobe have increased in size compared to prior studies. Pulmonary metastases cannot be excluded. 2. No evidence of lymphadenopathy or pleural effusion.  10/14/2019- CT Chest WO Contrast 1. The 2 new right upper lobe nodules seen on the most recent study have resolved, consistent with a benign etiology.  2. The additional small nodules bilaterally are unchanged from the oldest available study of 07/11/2018. No new or enlarging nodules.  3. Left thyroid nodule also appears stable from the 07/11/2018 study. Consider thyroid ultrasound if not previously performed.(Ref: J Am Coll Radiol. 2015 Feb;12(2): 143-50).   Problem List: Patient Active Problem List   Diagnosis Date Noted  . Neuropathy 05/16/2018  . Fatigue 05/16/2018  . Inflammatory heel pain, right 05/16/2018   .  Encounter for antineoplastic chemotherapy 05/16/2018  . Drug-induced neutropenia ([place]) 05/09/2018  . Goals of care, counseling/discussion 04/18/2018  . Endometrial cancer ([place])   . Seborrhea 05/13/2016  . History of kidney stones 06/26/2015  . OP (osteoporosis) 06/26/2015  . Plantar fasciitis 06/26/2015  . Current tobacco use 06/26/2015  . Chronic airway obstruction ([place]) 10/08/2009  . Psoriasis 09/30/2008  . 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 Menopausal symptom 09/21/2007  . Tobacco use 09/21/2007  . Awareness of heartbeats 09/21/2007    Past Medical History: Past Medical History:  Diagnosis Date  . Arthritis   . Cancer [facility])    endometrial  . Endometrial mass   . History of chicken pox   . History of kidney stones   . History of measles   . History of mumps   . Osteoporosis   . PMB (postmenopausal bleeding)   . Psoriasis     Past Surgical History: Past Surgical History:  Procedure Laterality Date  . APPENDECTOMY  1960  . BREAST SURGERY Left    biopsy  . Injection varicose veins in legs Bilateral 2010   Dr. Hulda Humphrey  . LITHOTRIPSY  1994, 2004   for renal stones  . PORTA CATH INSERTION N/A 04/24/2018   Procedure: PORTA CATH INSERTION;  Surgeon: Katha Cabal, MD;  Location: [facility];  Service: Cardiovascular;  Laterality: N/A;  . ROBOTIC ASSISTED TOTAL HYSTERECTOMY Bilateral 04/04/2018   Procedure: ROBOTIC ASSISTED TOTAL HYSTERECTOMY,SENTINEL LYMPH NODE MAPPING AND BIOPSIES,AORTIC LYMPH NODE DISSECTION;  Surgeon: Gillis Ends, MD;  Location: [facility] ORS;  Service: Gynecology;  Laterality: Bilateral;  . TUBAL LIGATION  1980    OB History:  OB History  Gravida Para Term Preterm AB Living  1 1 1     1   SAB TAB Ectopic Multiple Live Births          1    # Outcome Date GA Lbr Len/2nd Weight Sex Delivery Anes PTL Lv  1 Term 89    M Vag-Spont   LIV  G1P1001 LMP:   Family History: Family History  Problem  Relation Age of Onset  . Breast cancer Mother   . Transient ischemic attack Mother   . Arthritis Mother   . Hypothyroidism Mother   . Lung cancer Mother   . Diabetes Father        type 2  . Hypertension Father   . Arthritis Father   . Colon cancer Sister   . Prostate cancer Brother   . Hyperlipidemia Son   . Stomach cancer Maternal Grandmother   . Stroke Maternal Grandfather     Social History: Social History   Socioeconomic History  . Marital status: Single    Spouse name: Not on file  . Number of children: 1  . Years of education: Not on file  . Highest education level: Not on file  Occupational History  . Occupation: Employed    Comment: Works at a [employer] doing [employer]  Tobacco Use  . Smoking status: Former Smoker    Packs/day: 0.25    Types: Cigarettes    Quit date: 12/08/2018    Years since quitting: 1.0  . Smokeless tobacco: Never Used  Substance and Sexual Activity  . Alcohol use: No    Alcohol/week: 0.0 standard drinks  . Drug use: No  . Sexual activity: Not Currently  Other Topics Concern  . Not on file  Social History Narrative  . Not on file   Social Determinants  of Health   Financial Resource Strain:   . Difficulty of Paying Living Expenses: Not on file  Food Insecurity:   . Worried About Charity fundraiser in the Last Year: Not on file  . Ran Out of Food in the Last Year: Not on file  Transportation Needs:   . Lack of Transportation (Medical): Not on file  . Lack of Transportation (Non-Medical): Not on file  Physical Activity:   . Days of Exercise per Week: Not on file  . Minutes of Exercise per Session: Not on file  Stress:   . Feeling of Stress : Not on file  Social Connections:   . Frequency of Communication with Friends and Family: Not on file  . Frequency of Social Gatherings with Friends and Family: Not on file  . Attends Religious Services: Not on file  . Active Member of Clubs or Organizations: Not on file  . Attends Theatre manager Meetings: Not on file  . Marital Status: Not on file  Intimate Partner Violence:   . Fear of Current or Ex-Partner: Not on file  . Emotionally Abused: Not on file  . Physically Abused: Not on file  . Sexually Abused: Not on file    Allergies: No Known Allergies  Current Medications: Current Outpatient Medications  Medication Sig Dispense Refill  . betamethasone dipropionate (DIPROLENE) 0.05 % cream Apply 1 application topically 2 (two) times daily as needed (for psoriasis on body). 30 g 1  . desonide (DESOWEN) 0.05 % cream Apply 1 application topically 2 (two) times daily as needed (for psoriasis on face). 30 g 1  . lidocaine-prilocaine (EMLA) cream Apply to affected area once 30 g 3  . Multiple Vitamins-Minerals (CENTRUM SILVER ADULT 50+ PO) Take 1 tablet by mouth daily.    . naproxen sodium (ALEVE) 220 MG tablet Take 220 mg by mouth daily as needed.     No current facility-administered medications for this visit.   Review of Systems General:  no complaints Skin: no complaints Eyes: no complaints HEENT: no complaints Breasts: no complaints Pulmonary: no complaints Cardiac: no complaints Gastrointestinal: no complaints Genitourinary/Sexual: no complaints Ob/Gyn: no complaints Musculoskeletal: no complaints Hematology: no complaints Neurologic/Psych: no complaints  Objective:  Physical Examination:  There were no vitals filed for this visit.    ECOG Performance Status: 0 - Asymptomatic  GENERAL: Patient is a well appearing female in no acute distress HEENT:  Sclera clear. Anicteric NODES:  Negative axillary, supraclavicular, inguinal lymph node survery LUNGS:  Clear to auscultation bilaterally.   HEART:  Regular rate and rhythm.  ABDOMEN:  Soft, nontender.  No hernias, incisions well healed. No masses or ascites EXTREMITIES:  No peripheral edema. Atraumatic. No cyanosis SKIN:  Clear with no obvious rashes or skin changes.  NEURO:  Nonfocal. Well  oriented.  Appropriate affect.  PELVIC: EGBUS: normal, Vagina: atropic with no lesions.  Bimanual/RV: normal, no masses.    Assessment:  Katherine Snyder is a 69 y.o. female diagnosed with stage IA uterine carcinosarcoma with RA TLH/BSO, negative staging 5/19 followed by vaginal brachytherapy and 6 cycles of carbo/taxol chemotherapy.  NED on exam today.    Chest CTs have  shown indeterminate lung nodules, some disapearing over time and some new thought to be benign.  She was a smoker, but stopped in 2/20.      Comorbidities complicating care: smoker, plantar fasciitis  Plan:   Problem List Items Addressed This Visit      Genitourinary   Endometrial  cancer ([place]) - Primary     Again reviewed surveillance guidelines including physical exam with pelvic exam every 3-6 months for 2-3 years then every 6 months for year 4-5 then annually thereafter. CA-125 has been normal post-operatively and is not followed regularly.   We again reviewed survivorship topics including signs and symptoms of potential recurrence, lifestyle, obesity, exercise, smoking & vaping cessation, sexual health, nutrition, and potential late and long term effects of treatment.   Pulmonary nodules have been stable to smaller recently and considered benign. No additional surveillance recommended.   Incidental finding of thyroid nodule and I again encouraged her to follow up with her PCP for consideration of thyroid ultrasound.   Continue follow-up with Dr. Tasia Catchings and Dr. Baruch Gouty. We will see her back 6 months or sooner if any concerning symptoms arise sooner.   She has agreed to colonoscopy.  Also encouraged her to contact her PCP to request COVID19 vaccine.   Beckey Rutter, DNP, AGNP-C [place] at [facility] [phone] (clinic)  I personally interviewed and examined the patient. Agreed with the above/below plan of care. I have directly contributed to assessment and plan of care of this patient and educated and  discussed with patient and family.  Mellody Drown, MD  CC:  [place], [place] [address] [place],  [place] [address] [phone]

## 2019-12-18 NOTE — Patient Instructions (Signed)
Please call your PCP to schedule an appointment for a physical and mammogram. I have sent a referral to GI for you to discuss scheduling a colonoscopy. Please call clinic if you have any concerning symptoms in the interim. Beckey Rutter, NP

## 2019-12-18 NOTE — Progress Notes (Signed)
Pt in for follow up, denies any concerns today. 

## 2019-12-25 ENCOUNTER — Ambulatory Visit: Payer: Medicare Other | Admitting: Radiation Oncology

## 2020-01-01 ENCOUNTER — Encounter: Payer: Self-pay | Admitting: *Deleted

## 2020-01-21 ENCOUNTER — Other Ambulatory Visit: Payer: Self-pay

## 2020-01-21 ENCOUNTER — Inpatient Hospital Stay: Payer: Medicare Other | Attending: Oncology

## 2020-01-21 DIAGNOSIS — C541 Malignant neoplasm of endometrium: Secondary | ICD-10-CM | POA: Insufficient documentation

## 2020-01-21 DIAGNOSIS — Z452 Encounter for adjustment and management of vascular access device: Secondary | ICD-10-CM | POA: Insufficient documentation

## 2020-01-21 DIAGNOSIS — Z95828 Presence of other vascular implants and grafts: Secondary | ICD-10-CM

## 2020-01-21 MED ORDER — HEPARIN SOD (PORK) LOCK FLUSH 100 UNIT/ML IV SOLN
500.0000 [IU] | Freq: Once | INTRAVENOUS | Status: AC
  Administered 2020-01-21: 500 [IU] via INTRAVENOUS
  Filled 2020-01-21: qty 5

## 2020-01-21 MED ORDER — SODIUM CHLORIDE 0.9% FLUSH
10.0000 mL | Freq: Once | INTRAVENOUS | Status: AC
  Administered 2020-01-21: 10 mL via INTRAVENOUS
  Filled 2020-01-21: qty 10

## 2020-01-24 IMAGING — CT CT ABD-PELV W/ CM
2 of 5 series · 13 of 46 positions shown, 15 images · IV contrast (iopamidol)
Comparison: CT AP 06/13/2013

CLINICAL DATA: Staging endometrial cancer.

EXAM:
CT CHEST, ABDOMEN, AND PELVIS WITH CONTRAST
TECHNIQUE: Multidetector CT imaging of the chest, abdomen and pelvis was
performed following the standard protocol during bolus
administration of intravenous contrast.
CONTRAST:  100mL 3T6YUM-JWW IOPAMIDOL (3T6YUM-JWW) INJECTION 61%

[Series 2: axials cap · axial · 0.68mm/px · z∈[-1528,-973]mm · 10 of 133 slices shown, 12 images]
[im 11/133  soft-tissue]
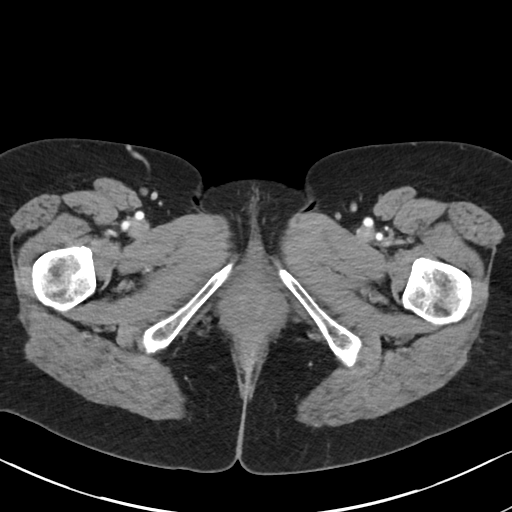
[im 11/133  bone]
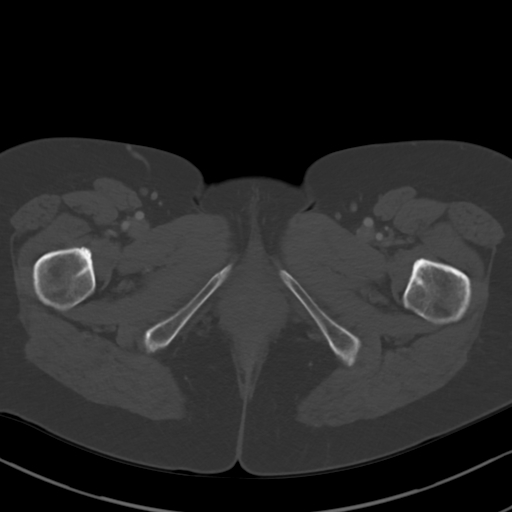
[im 21/133  soft-tissue]
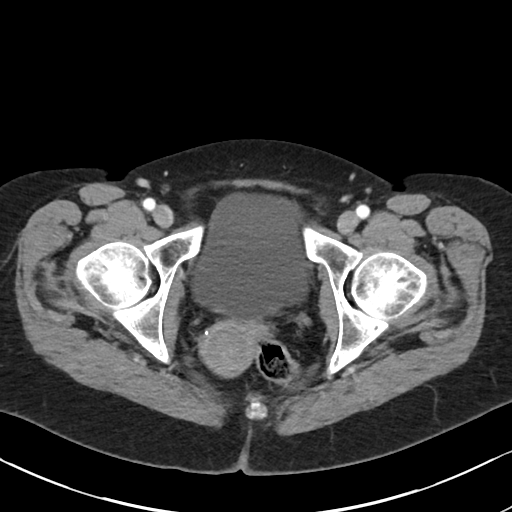
[im 41/133  soft-tissue]
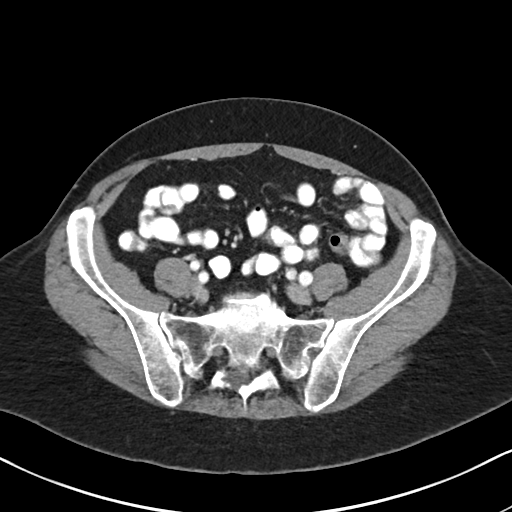
[im 51/133  soft-tissue]
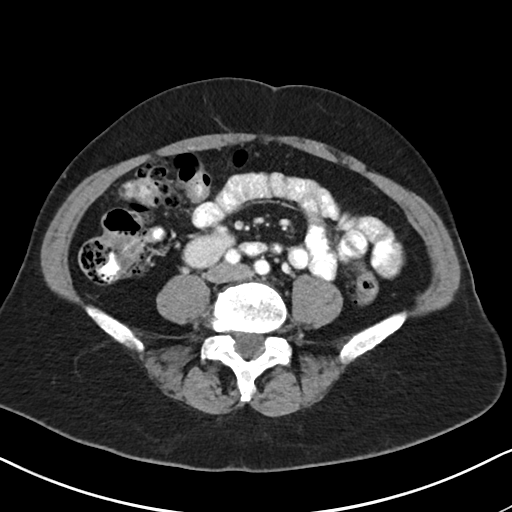
[im 61/133  soft-tissue]
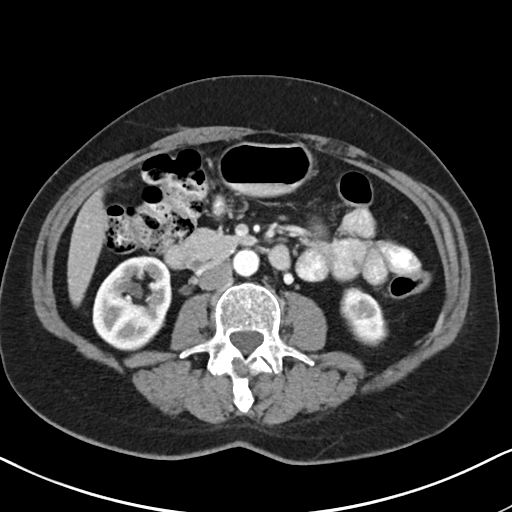
[im 72/133  soft-tissue]
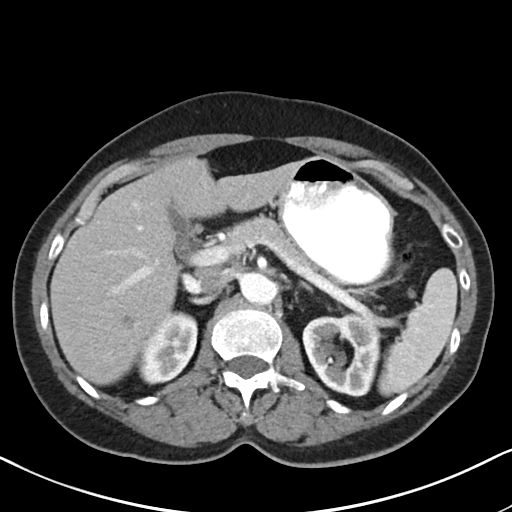
[im 82/133  soft-tissue]
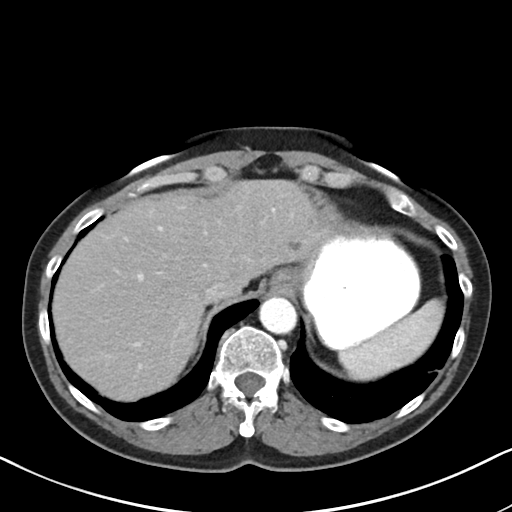
[im 102/133  soft-tissue]
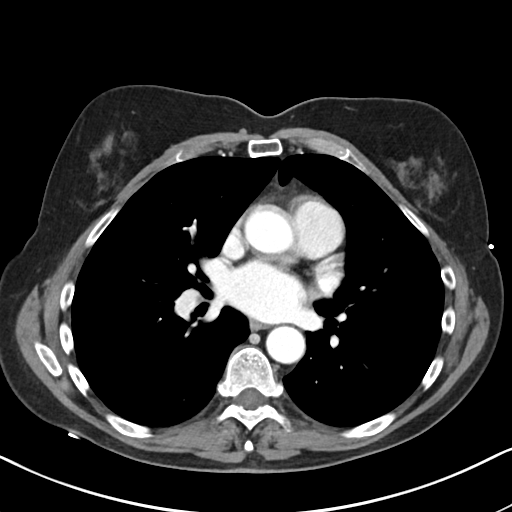
[im 112/133  soft-tissue]
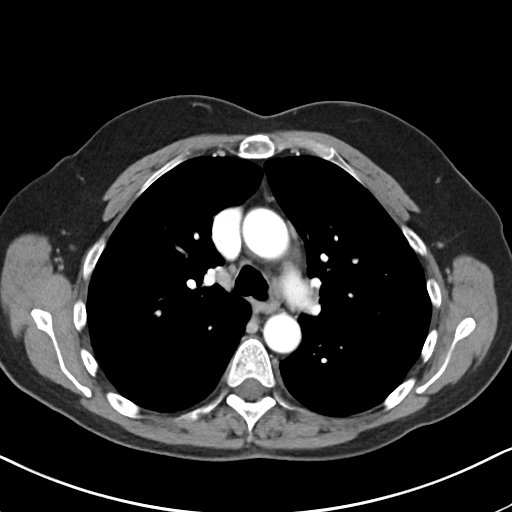
[im 112/133  bone]
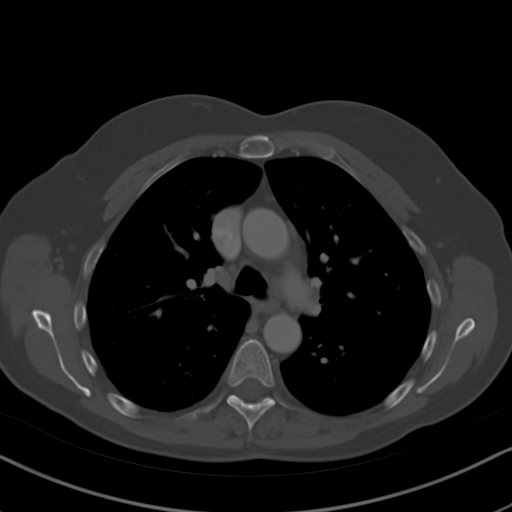
[im 122/133  soft-tissue]
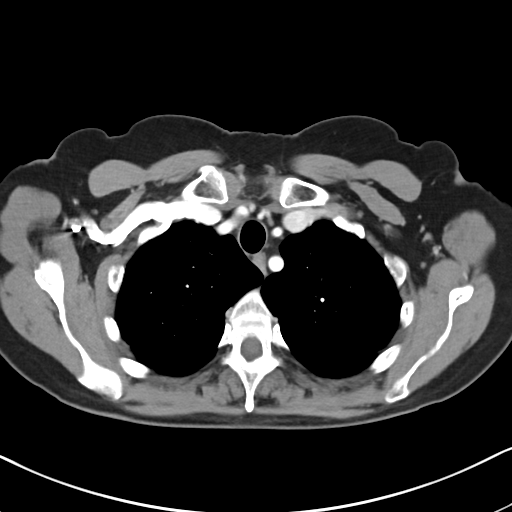

[Series 4: coronals cap · coronal · 0.68mm/px · 3 of 127 slices shown]
[im 43/127  soft-tissue]
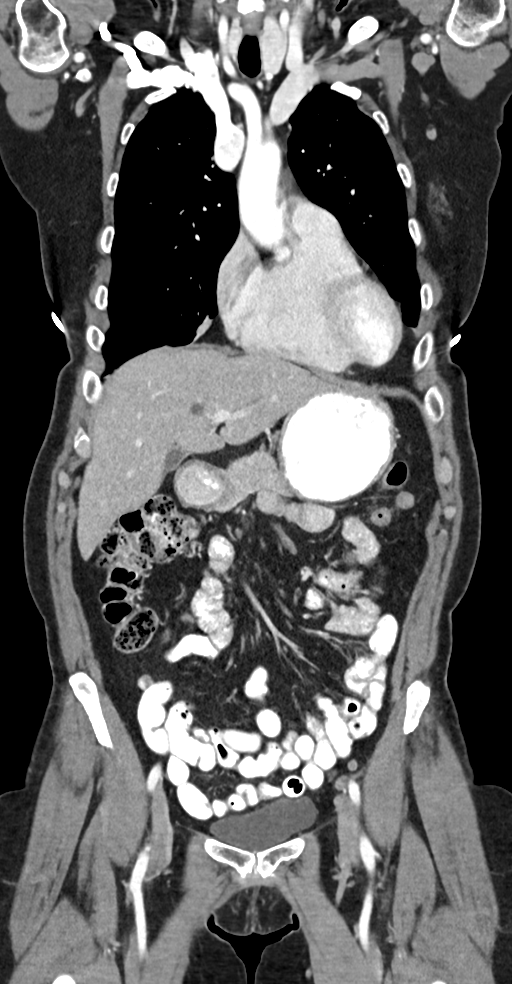
[im 57/127  soft-tissue]
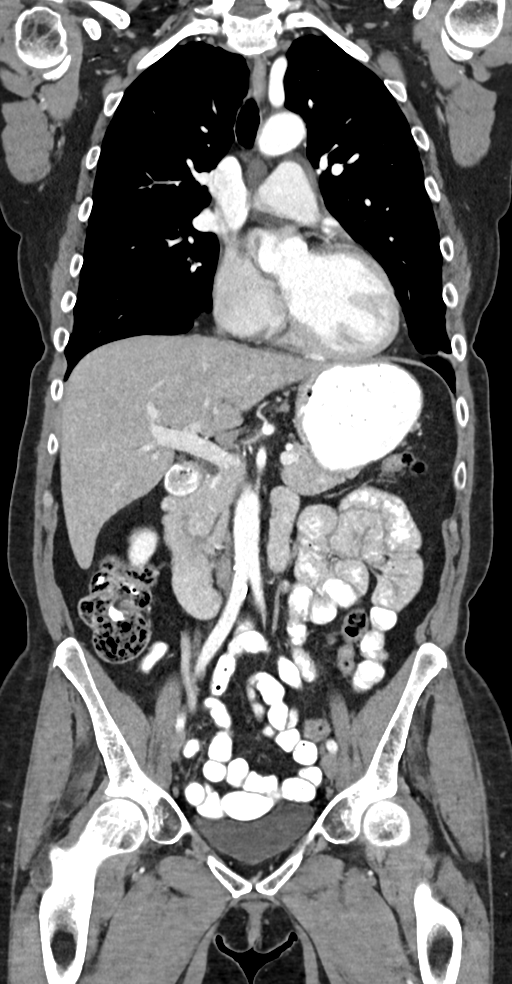
[im 71/127  soft-tissue]
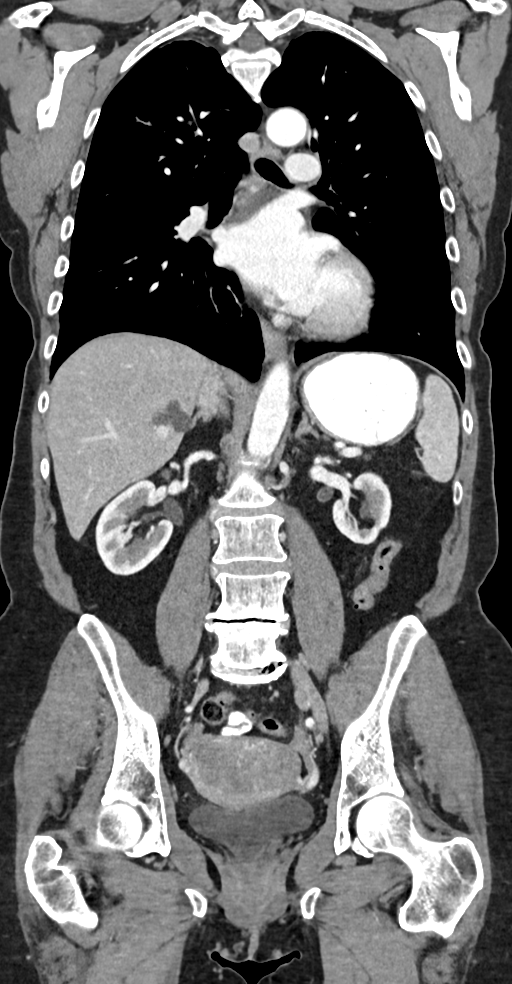

[13 of 46 positions shown; findings below may reference images not displayed]

FINDINGS: CT CHEST FINDINGS

Cardiovascular: The heart size appears normal. No pericardial
effusion identified.

Mediastinum/Nodes: Small bilateral thyroid nodules. The trachea
appears patent and is midline. Normal appearance of the esophagus.
No enlarged mediastinal or hilar lymph nodes.

Lungs/Pleura: No pleural effusion. Small scattered pulmonary nodules
are identified bilaterally, nonspecific. The index nodule in the
left lower lobe measures 8 mm and is not significantly changed from
CT AP from 3070, image 119/3. Small subpleural nodule overlying the
posterolateral left lower lobe measures 5 mm, image 101/3. No
comparison exam within this area available. Posterior left lower
lobe Lung nodule measuring 3 mm is noted, image 90/3. There is a
cluster of tree-in-bud nodules within the left upper lobe, image
[DATE] and image [DATE]. Likely inflammatory or infectious in etiology.
Within the right upper lobe there is a part solid nodule. This
measures 8 mm and has a central solid component measuring 3 mm,
image [DATE]. Small right middle lobe lung nodule measures 3 mm, image
110/3. In the right lower lobe there is a 5 mm solid nodule, image
74/3. Adjacent perifissural nodule within the right lower lobe
measures 5 mm, image 74/3. Nonspecific pneumonitis noted within the
posterior right lower lobe, image 120/3.

Musculoskeletal: No aggressive lytic or sclerotic bone lesions.

CT ABDOMEN PELVIS FINDINGS

Hepatobiliary: Multiple liver cysts are identified. Similar to
comparison exam. No suspicious enhancing liver abnormalities. The
gallbladder appears normal. No biliary dilatation.

Pancreas: Unremarkable. No pancreatic ductal dilatation or
surrounding inflammatory changes.

Spleen: Normal in size without focal abnormality.

Adrenals/Urinary Tract: Adrenal glands are unremarkable. Kidneys are
normal, without renal calculi, focal lesion, or hydronephrosis.
Bladder is unremarkable. Urinary bladder appears normal.

Stomach/Bowel: Stomach is normal. The small bowel loops have a
normal course and caliber without obstruction. No pathologic
dilatation of the colon.

Vascular/Lymphatic: Aortic atherosclerosis without aneurysm. No
adenopathy identified within the abdomen. No pelvic or inguinal
adenopathy.

Reproductive: The endometrial cavity is distended by a large
heterogeneous mass which measures approximately 3.9 x 6.8 by 6.1 cm.
This is compatible with known endometrial carcinoma. No adnexal mass
identified.

Other: No ascites. No peritoneal nodularity or omental caking
identified.

Musculoskeletal: No acute or significant osseous findings.
IMPRESSION: 1. Masslike expansion of the endometrial cavity known primary
endometrial carcinoma.
2. No evidence for nodal metastasis or solid organ metastasis within
the abdomen or pelvis.
3. Scattered nonspecific solid nodules and a single part solid
nodule identified in both lungs. Consensus criteria recommendations
for nodule followup does not apply to patients with known primary
malignancy.
4. Liver cysts.

## 2020-02-03 ENCOUNTER — Encounter: Payer: Self-pay | Admitting: Emergency Medicine

## 2020-02-03 ENCOUNTER — Emergency Department: Payer: Medicare Other

## 2020-02-03 DIAGNOSIS — Y999 Unspecified external cause status: Secondary | ICD-10-CM | POA: Diagnosis not present

## 2020-02-03 DIAGNOSIS — Z8542 Personal history of malignant neoplasm of other parts of uterus: Secondary | ICD-10-CM | POA: Insufficient documentation

## 2020-02-03 DIAGNOSIS — M79601 Pain in right arm: Secondary | ICD-10-CM | POA: Diagnosis not present

## 2020-02-03 DIAGNOSIS — Y9389 Activity, other specified: Secondary | ICD-10-CM | POA: Diagnosis not present

## 2020-02-03 DIAGNOSIS — W010XXA Fall on same level from slipping, tripping and stumbling without subsequent striking against object, initial encounter: Secondary | ICD-10-CM | POA: Insufficient documentation

## 2020-02-03 DIAGNOSIS — Z79899 Other long term (current) drug therapy: Secondary | ICD-10-CM | POA: Insufficient documentation

## 2020-02-03 DIAGNOSIS — S53401A Unspecified sprain of right elbow, initial encounter: Secondary | ICD-10-CM | POA: Diagnosis not present

## 2020-02-03 DIAGNOSIS — Y92009 Unspecified place in unspecified non-institutional (private) residence as the place of occurrence of the external cause: Secondary | ICD-10-CM | POA: Diagnosis not present

## 2020-02-03 DIAGNOSIS — Z87891 Personal history of nicotine dependence: Secondary | ICD-10-CM | POA: Diagnosis not present

## 2020-02-03 DIAGNOSIS — S59901A Unspecified injury of right elbow, initial encounter: Secondary | ICD-10-CM | POA: Diagnosis not present

## 2020-02-03 NOTE — ED Triage Notes (Signed)
Pt tripped and fell onto her right arm today. Pt c/o right elbow pain with limited movement. No obvious deformity.

## 2020-02-04 ENCOUNTER — Emergency Department
Admission: EM | Admit: 2020-02-04 | Discharge: 2020-02-04 | Disposition: A | Discharge status: Home / Self Care | Payer: Medicare Other | Attending: Emergency Medicine | Admitting: Emergency Medicine

## 2020-02-04 DIAGNOSIS — S53401A Unspecified sprain of right elbow, initial encounter: Secondary | ICD-10-CM

## 2020-02-04 MED ORDER — IBUPROFEN 600 MG PO TABS: 600.0000 mg | ORAL_TABLET | Freq: Three times a day (TID) | ORAL | 0 refills | Status: DC | PRN

## 2020-02-04 NOTE — ED Notes (Signed)
Pt states she tripped over a basket infront of her fire place while carrying her grandchild, states that she fell and caught her body weight on her right arm and feels like she heard a pop in the elbow. Denies pain when arm is immobilized but states when she moves it is a 6/10.

## 2020-02-04 NOTE — Discharge Instructions (Addendum)
Wear your sling for the next week. It is OK to take this off to shower, but try to keep it on most of the day.  DO NOT LIFT ANYTHING WITH THE RIGHT ARM OR HAND FOR THE NEXT WEEK  If pain is improving and you are able to begin to move the elbow with minimal pain after 3 days, you can start to attempt to move your elbow without a sling on. Start by doing this several times a day then increase as long as you are pain free.  If pain remains after 1 week, follow-up with your doctor or Urgent Care for a repeat X Ray  Elevate your elbow and apply ice intermittently for the next 24 hours

## 2020-02-04 NOTE — ED Provider Notes (Signed)
[facility] Emergency Department Provider Note  ____________________________________________   First MD Initiated Contact with Patient 02/04/20 0040     (approximate)  I have reviewed the triage vital signs and the nursing notes.   HISTORY  Chief Complaint Fall    HPI Katherine Snyder is a 69 y.o. female  Here with right arm pain.   Patient was holding her grandchild today when she fell.  She landed onto her right arm.  The arm was outstretched at the time.  She felt a popping sensation.  She has since had moderate, aching, right elbow pain that is worse with flexion as well as supination.  Denies any open wounds.  No distal forearm or proximal upper arm pain.  No head injury.  No numbness or weakness.  Pain is worse with movement and palpation.  No leaving factors.       Past Medical History:  Diagnosis Date  . Arthritis   . Cancer [facility])    endometrial  . Endometrial mass   . History of chicken pox   . History of kidney stones   . History of measles   . History of mumps   . Osteoporosis   . PMB (postmenopausal bleeding)   . Psoriasis     Patient Active Problem List   Diagnosis Date Noted  . Neuropathy 05/16/2018  . Fatigue 05/16/2018  . Inflammatory heel pain, right 05/16/2018  . Encounter for antineoplastic chemotherapy 05/16/2018  . Drug-induced neutropenia ([place]) 05/09/2018  . Goals of care, counseling/discussion 04/18/2018  . Endometrial cancer ([place])   . Seborrhea 05/13/2016  . History of kidney stones 06/26/2015  . OP (osteoporosis) 06/26/2015  . Plantar fasciitis 06/26/2015  . Current tobacco use 06/26/2015  . Chronic airway obstruction ([place]) 10/08/2009  . Psoriasis 09/30/2008  . 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 Menopausal symptom 09/21/2007  . Tobacco use 09/21/2007  . Awareness of heartbeats 09/21/2007    Past Surgical History:  Procedure Laterality Date  . APPENDECTOMY  1960  . BREAST SURGERY  Left    biopsy  . Injection varicose veins in legs Bilateral 2010   Dr. Hulda Humphrey  . LITHOTRIPSY  1994, 2004   for renal stones  . PORTA CATH INSERTION N/A 04/24/2018   Procedure: PORTA CATH INSERTION;  Surgeon: Katha Cabal, MD;  Location: [place] CV LAB;  Service: Cardiovascular;  Laterality: N/A;  . ROBOTIC ASSISTED TOTAL HYSTERECTOMY Bilateral 04/04/2018   Procedure: ROBOTIC ASSISTED TOTAL HYSTERECTOMY,SENTINEL LYMPH NODE MAPPING AND BIOPSIES,AORTIC LYMPH NODE DISSECTION;  Surgeon: Gillis Ends, MD;  Location: [facility] ORS;  Service: Gynecology;  Laterality: Bilateral;  . TUBAL LIGATION  1980    Prior to Admission medications   Medication Sig Start Date End Date Taking? Authorizing Provider  betamethasone dipropionate (DIPROLENE) 0.05 % cream Apply 1 application topically 2 (two) times daily as needed (for psoriasis on body). 01/28/19   Chrismon, Vickki Muff, PA  desonide (DESOWEN) 0.05 % cream Apply 1 application topically 2 (two) times daily as needed (for psoriasis on face). 01/28/19   Chrismon, Vickki Muff, PA  ibuprofen (ADVIL) 600 MG tablet Take 1 tablet (600 mg total) by mouth every 8 (eight) hours as needed for moderate pain. 02/04/20   Duffy Bruce, MD  lidocaine-prilocaine (EMLA) cream Apply to affected area once 10/16/19   Earlie Server, MD  Multiple Vitamins-Minerals (CENTRUM SILVER ADULT 50+ PO) Take 1 tablet by mouth daily.    [provider]  naproxen sodium (ALEVE) 220  MG tablet Take 220 mg by mouth daily as needed.    [provider]    Allergies Patient has no known allergies.  Family History  Problem Relation Age of Onset  . Breast cancer Mother   . Transient ischemic attack Mother   . Arthritis Mother   . Hypothyroidism Mother   . Lung cancer Mother   . Diabetes Father        type 2  . Hypertension Father   . Arthritis Father   . Colon cancer Sister   . Prostate cancer Brother   . Hyperlipidemia Son   . Stomach cancer Maternal  Grandmother   . Stroke Maternal Grandfather     Social History Social History   Tobacco Use  . Smoking status: Former Smoker    Packs/day: 0.25    Types: Cigarettes    Quit date: 12/08/2018    Years since quitting: 1.1  . Smokeless tobacco: Never Used  Substance Use Topics  . Alcohol use: No    Alcohol/week: 0.0 standard drinks  . Drug use: No    Review of Systems  Review of Systems  Constitutional: Negative for chills and fever.  HENT: Negative for sore throat.   Respiratory: Negative for shortness of breath.   Cardiovascular: Negative for chest pain.  Gastrointestinal: Negative for abdominal pain.  Genitourinary: Negative for flank pain.  Musculoskeletal: Positive for arthralgias. Negative for neck pain.  Skin: Negative for rash and wound.  Allergic/Immunologic: Negative for immunocompromised state.  Neurological: Negative for weakness and numbness.  Hematological: Does not bruise/bleed easily.  All other systems reviewed and are negative.    ____________________________________________  PHYSICAL EXAM:      VITAL SIGNS: ED Triage Vitals [02/03/20 2151]  Enc Vitals Group     BP 140/86     Pulse Rate 69     Resp 18     Temp 98.5 F (36.9 C)     Temp Source Oral     SpO2 99 %     Weight      Height      Head Circumference      Peak Flow      Pain Score      Pain Loc      Pain Edu?      Excl. in [place]?      Physical Exam Vitals and nursing note reviewed.  Constitutional:      General: She is not in acute distress.    Appearance: She is well-developed.  HENT:     Head: Normocephalic and atraumatic.  Eyes:     Conjunctiva/sclera: Conjunctivae normal.  Cardiovascular:     Rate and Rhythm: Normal rate and regular rhythm.     Heart sounds: Normal heart sounds.  Pulmonary:     Effort: Pulmonary effort is normal. No respiratory distress.     Breath sounds: No wheezing.  Abdominal:     General: There is no distension.  Musculoskeletal:     Cervical  back: Neck supple.  Skin:    General: Skin is warm.     Capillary Refill: Capillary refill takes less than 2 seconds.     Findings: No rash.  Neurological:     Mental Status: She is alert and oriented to person, place, and time.     Motor: No abnormal muscle tone.      UPPER EXTREMITY EXAM: RIGHT  INSPECTION & PALPATION: Moderate TTP over AC fossa and radial head, no joint warmth or swelling. No open wounds.  Minimal pain with supination/pronation, flexion of elbow. No proximal humerus TTP. No distal forearm TTP or deformity.  SENSORY: Sensation is intact to light touch in:  Superficial radial nerve distribution (dorsal first web space) Median nerve distribution (tip of index finger)   Ulnar nerve distribution (tip of small finger)     MOTOR:  + Motor posterior interosseous nerve (thumb IP extension) + Anterior interosseous nerve (thumb IP flexion, index finger DIP flexion) + Radial nerve (wrist extension) + Median nerve (palpable firing thenar mass) + Ulnar nerve (palpable firing of first dorsal interosseous muscle)  VASCULAR: 2+ radial pulse Brisk capillary refill < 2 sec, fingers warm and well-perfused  COMPARTMENTS: Soft, warm, well-perfused No pain with passive extension No paresthesias  ____________________________________________   LABS (all labs ordered are listed, but only abnormal results are displayed)  Labs Reviewed - No data to display  ____________________________________________  EKG: None ________________________________________  RADIOLOGY All imaging, including plain films, CT scans, and ultrasounds, independently reviewed by me, and interpretations confirmed via formal radiology reads.  ED MD interpretation:   XR Elbow Right: No acute fracture  Official radiology report(s): DG Elbow 2 Views Right  Result Date: 02/03/2020 CLINICAL DATA:  Tripped and fell on right arm, pain, limited range of motion EXAM: RIGHT ELBOW - 2 VIEW COMPARISON:  None.  FINDINGS: Frontal and lateral views of the right elbow are obtained. There are no acute displaced fractures. Alignment is anatomic. Joint spaces are well preserved. Mild enthesopathic changes along the olecranon. There is elevation of the anterior fat pad, nonspecific. Posterior fat pad is not elevated, and there is no convincing evidence for effusion. The soft tissues are unremarkable. IMPRESSION: 1. No acute displaced fracture. Electronically Signed   By: Randa Ngo M.D.   On: 02/03/2020 22:08    ____________________________________________  PROCEDURES   Procedure(s) performed (including Critical Care):  Procedures  ____________________________________________  INITIAL IMPRESSION / MDM / [place] / ED COURSE  As part of my medical decision making, I reviewed the following data within the [place] notes reviewed and incorporated, Old chart reviewed, Notes from prior ED visits, and [place] Controlled Substance Database       *Katherine Snyder was evaluated in Emergency Department on 02/04/2020 for the symptoms described in the history of present illness. She was evaluated in the context of the global COVID-19 pandemic, which necessitated consideration that the patient might be at risk for infection with the SARS-CoV-2 virus that causes COVID-19. Institutional protocols and algorithms that pertain to the evaluation of patients at risk for COVID-19 are in a state of rapid change based on information released by regulatory bodies including the CDC and federal and state organizations. These policies and algorithms were followed during the patient's care in the ED.  Some ED evaluations and interventions may be delayed as a result of limited staffing during the pandemic.*     Medical Decision Making: 69 year old right-hand-dominant female here with right elbow pain after fall.  Plain films negative.  No additional bony tenderness to suggest forearm or humeral  injury.  She has mild anterior swelling noted on x-ray, but no displacement.  Suspect possible elbow sprain versus nondisplaced, mild radial head injury.  Will have her placed in the sling, nonweightbearing, with gentle range of motion once pain improves.  She will follow-up with her PCP in 1 week.  Return precautions given.  No distal numbness, weakness, or signs of neurovascular compromise.  ____________________________________________  FINAL CLINICAL IMPRESSION(S) / ED  DIAGNOSES  Final diagnoses:  Sprain of right elbow, initial encounter     MEDICATIONS GIVEN DURING THIS VISIT:  Medications - No data to display   ED Discharge Orders         Ordered    ibuprofen (ADVIL) 600 MG tablet  Every 8 hours PRN     02/04/20 0140           Note:  This document was prepared using Dragon voice recognition software and may include unintentional dictation errors.   Duffy Bruce, MD 02/04/20 [id number]

## 2020-02-18 ENCOUNTER — Telehealth: Payer: Self-pay

## 2020-02-18 NOTE — Telephone Encounter (Signed)
Scheduled for consult with you 02/24/20 @10am . Thanks

## 2020-02-18 NOTE — Telephone Encounter (Signed)
Patient would like to schedule colon procedure. Referral closed

## 2020-02-24 ENCOUNTER — Telehealth (INDEPENDENT_AMBULATORY_CARE_PROVIDER_SITE_OTHER): Payer: Self-pay | Admitting: Gastroenterology

## 2020-02-24 ENCOUNTER — Other Ambulatory Visit (INDEPENDENT_AMBULATORY_CARE_PROVIDER_SITE_OTHER): Payer: Self-pay

## 2020-02-24 DIAGNOSIS — Z800 Family history of malignant neoplasm of digestive organs: Secondary | ICD-10-CM

## 2020-02-24 DIAGNOSIS — C541 Malignant neoplasm of endometrium: Secondary | ICD-10-CM

## 2020-02-24 DIAGNOSIS — Z1211 Encounter for screening for malignant neoplasm of colon: Secondary | ICD-10-CM

## 2020-02-24 NOTE — Progress Notes (Signed)
Gastroenterology Pre-Procedure Review  Request Date: Friday 03/13/20 Requesting Physician: Dr. Marius Ditch  PATIENT REVIEW QUESTIONS: The patient responded to the following health history questions as indicated:    1. Are you having any GI issues? no 2. Do you have a personal history of Polyps? no 3. Do you have a family history of Colon Cancer or Polyps? yes (sister colon cancer) 4. Diabetes Mellitus? no 5. Joint replacements in the past 12 months?no 6. Major health problems in the past 3 months?no 7. Any artificial heart valves, MVP, or defibrillator?no    MEDICATIONS & ALLERGIES:    Patient reports the following regarding taking any anticoagulation/antiplatelet therapy:   Plavix, Coumadin, Eliquis, Xarelto, Lovenox, Pradaxa, Brilinta, or Effient? no Aspirin? no  Patient confirms/reports the following medications:  Current Outpatient Medications  Medication Sig Dispense Refill  . betamethasone dipropionate (DIPROLENE) 0.05 % cream Apply 1 application topically 2 (two) times daily as needed (for psoriasis on body). 30 g 1  . desonide (DESOWEN) 0.05 % cream Apply 1 application topically 2 (two) times daily as needed (for psoriasis on face). 30 g 1  . ibuprofen (ADVIL) 600 MG tablet Take 1 tablet (600 mg total) by mouth every 8 (eight) hours as needed for moderate pain. 20 tablet 0  . lidocaine-prilocaine (EMLA) cream Apply to affected area once 30 g 3  . Multiple Vitamins-Minerals (CENTRUM SILVER ADULT 50+ PO) Take 1 tablet by mouth daily.    . naproxen sodium (ALEVE) 220 MG tablet Take 220 mg by mouth daily as needed.     No current facility-administered medications for this visit.    Patient confirms/reports the following allergies:  No Known Allergies  No orders of the defined types were placed in this encounter.   AUTHORIZATION INFORMATION Primary Insurance: 1D#: Group #:  Secondary Insurance: 1D#: Group #:  SCHEDULE INFORMATION: Date: Friday  03/13/20 Time: Location:[facility]

## 2020-02-24 NOTE — Progress Notes (Signed)
Complete physical exam  I,Katherine Snyder,acting as a scribe for [employer], PA.,have documented all relevant documentation on the behalf of Katherine Murders, PA,as directed by  [employer], PA while in the presence of [employer], [place].   Patient: Katherine Snyder   DOB: 1951/10/29   69 y.o. Female  MRN: [id number] Visit Date: 02/25/2020  Today's healthcare provider: [name], PA  Subjective:    Chief Complaint  Patient presents with  . Annual Exam    [name] is a 69 y.o. female who presents today for a complete physical exam.  She reports consuming a general diet.  She generally feels well. She reports sleeping well. She does not have additional problems to discuss today.  HPI  Patient sees gynecology for female health.  Mammogram-08/18/2016 Bone Density-12/09/2014 Last Pap:04/04/2018  Past Medical History:  Diagnosis Date  . Arthritis   . Cancer [facility])    endometrial  . Endometrial mass   . History of chicken pox   . History of kidney stones   . History of measles   . History of mumps   . Osteoporosis   . PMB (postmenopausal bleeding)   . Psoriasis    Past Surgical History:  Procedure Laterality Date  . APPENDECTOMY  1960  . BREAST SURGERY Left    biopsy  . Injection varicose veins in legs Bilateral 2010   Dr. Hulda Humphrey  . LITHOTRIPSY  1994, 2004   for renal stones  . PORTA CATH INSERTION N/A 04/24/2018   Procedure: PORTA CATH INSERTION;  Surgeon: Katha Cabal, MD;  Location: [place] CV LAB;  Service: Cardiovascular;  Laterality: N/A;  . ROBOTIC ASSISTED TOTAL HYSTERECTOMY Bilateral 04/04/2018   Procedure: ROBOTIC ASSISTED TOTAL HYSTERECTOMY,SENTINEL LYMPH NODE MAPPING AND BIOPSIES,AORTIC LYMPH NODE DISSECTION;  Surgeon: Gillis Ends, MD;  Location: [facility] ORS;  Service: Gynecology;  Laterality: Bilateral;  . TUBAL LIGATION  1980   Social History   Socioeconomic History  . Marital status: Single    Spouse name: Not on  file  . Number of children: 1  . Years of education: Not on file  . Highest education level: Not on file  Occupational History  . Occupation: Employed    Comment: Works at a [employer] doing [employer]  Tobacco Use  . Smoking status: Former Smoker    Packs/day: 0.25    Types: Cigarettes    Quit date: 12/08/2018    Years since quitting: 1.2  . Smokeless tobacco: Never Used  Substance and Sexual Activity  . Alcohol use: No    Alcohol/week: 0.0 standard drinks  . Drug use: No  . Sexual activity: Not Currently  Other Topics Concern  . Not on file  Social History Narrative  . Not on file   Social Determinants of Health   Financial Resource Strain:   . Difficulty of Paying Living Expenses:   Food Insecurity:   . Worried About Charity fundraiser in the Last Year:   . Arboriculturist in the Last Year:   Transportation Needs:   . Film/video editor (Medical):   Marland Kitchen Lack of Transportation (Non-Medical):   Physical Activity:   . Days of Exercise per Week:   . Minutes of Exercise per Session:   Stress:   . Feeling of Stress :   Social Connections:   . Frequency of Communication with Friends and Family:   . Frequency of Social Gatherings with Friends and Family:   . Attends  Religious Services:   . Active Member of Clubs or Organizations:   . Attends Archivist Meetings:   Marland Kitchen Marital Status:   Intimate Partner Violence:   . Fear of Current or Ex-Partner:   . Emotionally Abused:   Marland Kitchen Physically Abused:   . Sexually Abused:    Family Status  Relation Name Status  . Mother  Deceased at age 72       died from Metastatic cancer. Gallblader cancer  . Father  Alive  . Sister  Alive  . Brother  Alive  . Son  Alive  . MGM  Deceased  . MGF  Deceased   Family History  Problem Relation Age of Onset  . Breast cancer Mother   . Transient ischemic attack Mother   . Arthritis Mother   . Hypothyroidism Mother   . Lung cancer Mother   . Diabetes Father        type 2  .  Hypertension Father   . Arthritis Father   . Colon cancer Sister   . Prostate cancer Brother   . Hyperlipidemia Son   . Stomach cancer Maternal Grandmother   . Stroke Maternal Grandfather    No Known Allergies  Patient Care Team: Stefania Goulart, Vickki Muff, PA as PCP - General (Family Medicine) Mellody Drown, MD as Referring Physician (Obstetrics and Gynecology) Gillis Ends, MD as Referring Physician (Obstetrics and Gynecology) Clent Jacks, RN as Registered Nurse Noreene Filbert, MD as Referring Physician (Radiation Oncology) Earlie Server, MD as Consulting Physician (Oncology) Delana Meyer, Dolores Lory, MD (Vascular Surgery)   Medications: Outpatient Medications Prior to Visit  Medication Sig  . Multiple Vitamins-Minerals (CENTRUM SILVER ADULT 50+ PO) Take 1 tablet by mouth daily.  . naproxen sodium (ALEVE) 220 MG tablet Take 220 mg by mouth daily as needed.  . betamethasone dipropionate (DIPROLENE) 0.05 % cream Apply 1 application topically 2 (two) times daily as needed (for psoriasis on body). (Patient not taking: Reported on 02/24/2020)  . desonide (DESOWEN) 0.05 % cream Apply 1 application topically 2 (two) times daily as needed (for psoriasis on face). (Patient not taking: Reported on 02/24/2020)  . ibuprofen (ADVIL) 600 MG tablet Take 1 tablet (600 mg total) by mouth every 8 (eight) hours as needed for moderate pain. (Patient not taking: Reported on 02/24/2020)  . lidocaine-prilocaine (EMLA) cream Apply to affected area once (Patient not taking: Reported on 02/24/2020)   No facility-administered medications prior to visit.    Review of Systems  Constitutional: Negative.   HENT: Negative.   Eyes: Negative.   Respiratory: Negative.   Cardiovascular: Negative.   Gastrointestinal: Negative.   Endocrine: Negative.   Genitourinary: Negative.   Musculoskeletal: Negative.   Skin: Negative.   Allergic/Immunologic: Negative.   Neurological: Negative.   Hematological: Negative.    Psychiatric/Behavioral: Negative.         Objective:    BP 118/80 (BP Location: Right Arm, Patient Position: Sitting, Cuff Size: Normal)   Pulse 70   Temp (!) 97.3 F (36.3 C) (Skin)   Ht 5\' 8"  (1.727 m)   Wt 188 lb (85.3 kg)   SpO2 97%   BMI 28.59 kg/m  Wt Readings from Last 3 Encounters:  02/25/20 188 lb (85.3 kg)  12/18/19 189 lb 14.4 oz (86.1 kg)  10/16/19 188 lb 4.8 oz (85.4 kg)      Physical Exam Constitutional:      Appearance: Normal appearance. She is normal weight.  HENT:     Head: Normocephalic  and atraumatic.     Right Ear: Tympanic membrane, ear canal and external ear normal.     Left Ear: Tympanic membrane, ear canal and external ear normal.     Nose: Nose normal.     Mouth/Throat:     Mouth: Mucous membranes are moist.     Pharynx: Oropharynx is clear.  Eyes:     Extraocular Movements: Extraocular movements intact.     Conjunctiva/sclera: Conjunctivae normal.     Pupils: Pupils are equal, round, and reactive to light.  Cardiovascular:     Rate and Rhythm: Normal rate and regular rhythm.     Pulses: Normal pulses.     Heart sounds: Normal heart sounds.  Pulmonary:     Effort: Pulmonary effort is normal.     Breath sounds: Normal breath sounds.  Abdominal:     General: Abdomen is flat. Bowel sounds are normal.  Genitourinary:    Comments: Had follow up with Dr. Fransisca Connors (GYN) Feb. 2021. Musculoskeletal:        General: Normal range of motion.     Cervical back: Normal range of motion and neck supple.  Skin:    General: Skin is warm and dry.  Neurological:     General: No focal deficit present.     Mental Status: She is alert and oriented to person, place, and time. Mental status is at baseline.  Psychiatric:        Mood and Affect: Mood normal.        Behavior: Behavior normal.        Thought Content: Thought content normal.        Judgment: Judgment normal.     Fall Risk  02/25/2020 01/28/2019  Falls in the past year? 0 0   Depression  Screen  PHQ 2/9 Scores 01/28/2019 05/13/2016  PHQ - 2 Score 0 0  PHQ- 9 Score 0 -     Office Visit from 01/28/2019 in [place]  AUDIT-C Score  0     Functional Status Survey: Is the patient deaf or have difficulty hearing?: No Does the patient have difficulty seeing, even when wearing glasses/contacts?: No Does the patient have difficulty concentrating, remembering, or making decisions?: No Does the patient have difficulty walking or climbing stairs?: No Does the patient have difficulty dressing or bathing?: No Does the patient have difficulty doing errands alone such as visiting a doctor's office or shopping?: No  Home Exercise  02/25/2020  Current Exercise Habits The patient does not participate in regular exercise at present    No results found for any visits on 02/25/20.    Assessment & Plan:    Routine Health Maintenance and Physical Exam  Exercise Activities and Dietary recommendations Goals   Recommend low fat diet with extra fiber and 4-6 glasses of water daily. Exercise 30-40 minutes 3-4 days a week.     Immunization History  Administered Date(s) Administered  . Influenza-Unspecified 08/16/2016  . Pneumococcal Conjugate-13 01/28/2019  . Pneumococcal Polysaccharide-23 10/08/2009  . Td 01/28/2019  . Tdap 10/08/2009    Health Maintenance  Topic Date Due  . COVID-19 Vaccine (1) Never done  . COLONOSCOPY  Never done  . MAMMOGRAM  08/17/2018  . PNA vac Low Risk Adult (2 of 2 - PPSV23) 01/28/2020  . INFLUENZA VACCINE  06/07/2020  . TETANUS/TDAP  01/27/2029  . DEXA SCAN  Completed  . Hepatitis C Screening  Completed    Discussed health benefits of physical activity, and encouraged her to engage  in regular exercise appropriate for her age and condition.  1. Annual physical exam General health stable. Advised to discuss COVID vaccination with oncologist as she has finished her chemotherapy and radiation therapy for endometrial cancer. She will call  about scheduling mammograms and continue follow up with GYN and oncologist. - Comprehensive metabolic panel - Lipid panel - TSH - CBC with Differential/Platelet  2. 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 Still has port in the right upper chest.  3. Weight gain Feels she has gained weight since finishing chemotherapy and radiation therapy for endometrial cancer back in the Fall of 2020. Check labs for metabolic disorder or thyroid disease. - Comprehensive metabolic panel - Lipid panel - TSH - CBC with Differential/Platelet - Hemoglobin A1c  4. Constipation, unspecified constipation type Well controlled with use of Sen[place]ot prn. Recommend increase in fluid/water intake and extra fiber in diet.  5. Thyroid nodule Oncologist (Dr. Tasia Catchings) found a nodule in the left lobe of the thyroid on CT scan of chest when evaluating pulmonary nodules. Nodule palpable today and will get follow up labs and thyroid ultrasound. - Comprehensive metabolic panel - Lipid panel - TSH - US THYROID - T4   No follow-ups on file.     Andres Shad, PA, have reviewed all documentation for this visit. The documentation on 02/25/20 for the exam, diagnosis, procedures, and orders are all accurate and complete.    Katherine Snyder, [place] [phone] (phone) [phone] (fax)  [place]

## 2020-02-25 ENCOUNTER — Encounter: Payer: Self-pay | Admitting: Family Medicine

## 2020-02-25 ENCOUNTER — Other Ambulatory Visit: Payer: Self-pay

## 2020-02-25 ENCOUNTER — Ambulatory Visit (INDEPENDENT_AMBULATORY_CARE_PROVIDER_SITE_OTHER): Payer: Medicare Other | Admitting: Family Medicine

## 2020-02-25 VITALS — BP 118/80 | HR 70 | Temp 97.3°F | Ht 68.0 in | Wt 188.0 lb

## 2020-02-25 DIAGNOSIS — R635 Abnormal weight gain: Secondary | ICD-10-CM

## 2020-02-25 DIAGNOSIS — E041 Nontoxic single thyroid nodule: Secondary | ICD-10-CM

## 2020-02-25 DIAGNOSIS — C541 Malignant neoplasm of endometrium: Secondary | ICD-10-CM

## 2020-02-25 DIAGNOSIS — Z0000 Encounter for general adult medical examination without abnormal findings: Secondary | ICD-10-CM | POA: Diagnosis not present

## 2020-02-25 DIAGNOSIS — K5900 Constipation, unspecified: Secondary | ICD-10-CM

## 2020-02-25 NOTE — Patient Instructions (Signed)

## 2020-02-26 ENCOUNTER — Other Ambulatory Visit: Payer: Self-pay | Admitting: Family Medicine

## 2020-02-26 DIAGNOSIS — Z1231 Encounter for screening mammogram for malignant neoplasm of breast: Secondary | ICD-10-CM

## 2020-02-26 LAB — CBC WITH DIFFERENTIAL/PLATELET
Basophils Absolute: 0.1 10*3/uL (ref 0.0–0.2)
Basos: 1 %
EOS (ABSOLUTE): 0.1 10*3/uL (ref 0.0–0.4)
Eos: 1 %
Hematocrit: 39.6 % (ref 34.0–46.6)
Hemoglobin: 13.1 g/dL (ref 11.1–15.9)
Immature Grans (Abs): 0 10*3/uL (ref 0.0–0.1)
Immature Granulocytes: 0 %
Lymphocytes Absolute: 2.3 10*3/uL (ref 0.7–3.1)
Lymphs: 32 %
MCH: 29.4 pg (ref 26.6–33.0)
MCHC: 33.1 g/dL (ref 31.5–35.7)
MCV: 89 fL (ref 79–97)
Monocytes Absolute: 0.6 10*3/uL (ref 0.1–0.9)
Monocytes: 9 %
Neutrophils Absolute: 4 10*3/uL (ref 1.4–7.0)
Neutrophils: 57 %
Platelets: 246 10*3/uL (ref 150–450)
RBC: 4.45 x10E6/uL (ref 3.77–5.28)
RDW: 12.1 % (ref 11.7–15.4)
WBC: 7.1 10*3/uL (ref 3.4–10.8)

## 2020-02-26 LAB — COMPREHENSIVE METABOLIC PANEL
ALT: 12 IU/L (ref 0–32)
AST: 15 IU/L (ref 0–40)
Albumin/Globulin Ratio: 1.8 (ref 1.2–2.2)
Albumin: 4.4 g/dL (ref 3.8–4.8)
Alkaline Phosphatase: 86 IU/L (ref 39–117)
BUN/Creatinine Ratio: 19 (ref 12–28)
BUN: 16 mg/dL (ref 8–27)
Bilirubin Total: 0.4 mg/dL (ref 0.0–1.2)
CO2: 23 mmol/L (ref 20–29)
Calcium: 9.3 mg/dL (ref 8.7–10.3)
Chloride: 102 mmol/L (ref 96–106)
Creatinine, Ser: 0.85 mg/dL (ref 0.57–1.00)
GFR calc Af Amer: 83 mL/min/{1.73_m2} (ref >59)
GFR calc non Af Amer: 72 mL/min/{1.73_m2} (ref >59)
Globulin, Total: 2.5 g/dL (ref 1.5–4.5)
Glucose: 87 mg/dL (ref 65–99)
Potassium: 4.2 mmol/L (ref 3.5–5.2)
Sodium: 140 mmol/L (ref 134–144)
Total Protein: 6.9 g/dL (ref 6.0–8.5)

## 2020-02-26 LAB — LIPID PANEL
Chol/HDL Ratio: 3 ratio (ref 0.0–4.4)
Cholesterol, Total: 152 mg/dL (ref 100–199)
HDL: 51 mg/dL (ref >39)
LDL Chol Calc (NIH): 86 mg/dL (ref 0–99)
Triglycerides: 76 mg/dL (ref 0–149)
VLDL Cholesterol Cal: 15 mg/dL (ref 5–40)

## 2020-02-26 LAB — T4: T4, Total: 8.9 ug/dL (ref 4.5–12.0)

## 2020-02-26 LAB — HEMOGLOBIN A1C
Est. average glucose Bld gHb Est-mCnc: 111 mg/dL
Hgb A1c MFr Bld: 5.5 % (ref 4.8–5.6)

## 2020-02-26 LAB — TSH: TSH: 1.34 u[IU]/mL (ref 0.450–4.500)

## 2020-03-04 ENCOUNTER — Ambulatory Visit
Admission: RE | Admit: 2020-03-04 | Discharge: 2020-03-04 | Disposition: A | Discharge status: Home / Self Care | Payer: Medicare Other | Source: Ambulatory Visit | Attending: Family Medicine | Admitting: Family Medicine

## 2020-03-04 ENCOUNTER — Other Ambulatory Visit: Payer: Self-pay

## 2020-03-04 DIAGNOSIS — E041 Nontoxic single thyroid nodule: Secondary | ICD-10-CM | POA: Diagnosis not present

## 2020-03-04 DIAGNOSIS — Z1231 Encounter for screening mammogram for malignant neoplasm of breast: Secondary | ICD-10-CM

## 2020-03-04 HISTORY — DX: Personal history of irradiation: Z92.3

## 2020-03-04 HISTORY — DX: Personal history of antineoplastic chemotherapy: Z92.21

## 2020-03-05 ENCOUNTER — Telehealth: Payer: Self-pay

## 2020-03-05 ENCOUNTER — Other Ambulatory Visit: Payer: Self-pay | Admitting: Family Medicine

## 2020-03-05 DIAGNOSIS — E041 Nontoxic single thyroid nodule: Secondary | ICD-10-CM

## 2020-03-05 NOTE — Progress Notes (Signed)
D

## 2020-03-05 NOTE — Telephone Encounter (Signed)
-----   Message from Margo Common, [place] sent at 03/05/2020 12:33 PM EDT ----- Ultrasound confirms nodule in the left thyroid and should proceed with scheduling needle biopsy by radiologist.

## 2020-03-05 NOTE — Telephone Encounter (Signed)
VM  Not set up.   Patient also needs to be told her mammogram was normal.

## 2020-03-05 NOTE — Telephone Encounter (Signed)
Placed order for biopsy with her history of endometrial cancer and finding this solid thyroid nodule. Be sure this information is shared with her oncologist (Dr. Tasia Catchings) when path report is available.

## 2020-03-05 NOTE — Telephone Encounter (Signed)
Katherine Snyder, Patient has been advised.  I do not see a referral in her chart.  If the radiologist is doing the procedure do we just order it as referral to radiology or as an actual procedure.   The patient did note that she has several appointments next month (colonoscopy and several with oncology). She asked that when they are making the appointment if they could see those and maybe work around them. Thank you

## 2020-03-11 ENCOUNTER — Other Ambulatory Visit: Payer: Self-pay

## 2020-03-11 ENCOUNTER — Other Ambulatory Visit
Admission: RE | Admit: 2020-03-11 | Discharge: 2020-03-11 | Disposition: A | Discharge status: Home / Self Care | Payer: Medicare Other | Source: Ambulatory Visit | Attending: Gastroenterology | Admitting: Gastroenterology

## 2020-03-11 ENCOUNTER — Inpatient Hospital Stay: Payer: Medicare Other | Attending: Oncology

## 2020-03-11 DIAGNOSIS — Z01812 Encounter for preprocedural laboratory examination: Secondary | ICD-10-CM | POA: Insufficient documentation

## 2020-03-11 DIAGNOSIS — Z452 Encounter for adjustment and management of vascular access device: Secondary | ICD-10-CM | POA: Diagnosis not present

## 2020-03-11 DIAGNOSIS — Z95828 Presence of other vascular implants and grafts: Secondary | ICD-10-CM

## 2020-03-11 DIAGNOSIS — Z20822 Contact with and (suspected) exposure to covid-19: Secondary | ICD-10-CM | POA: Diagnosis not present

## 2020-03-11 DIAGNOSIS — C541 Malignant neoplasm of endometrium: Secondary | ICD-10-CM | POA: Diagnosis present

## 2020-03-11 LAB — SARS CORONAVIRUS 2 (TAT 6-24 HRS): SARS Coronavirus 2: NEGATIVE

## 2020-03-11 MED ORDER — HEPARIN SOD (PORK) LOCK FLUSH 100 UNIT/ML IV SOLN
INTRAVENOUS | Status: AC
  Filled 2020-03-11: qty 5

## 2020-03-11 MED ORDER — HEPARIN SOD (PORK) LOCK FLUSH 100 UNIT/ML IV SOLN
500.0000 [IU] | Freq: Once | INTRAVENOUS | Status: AC
  Administered 2020-03-11: 500 [IU] via INTRAVENOUS
  Filled 2020-03-11: qty 5

## 2020-03-11 MED ORDER — SODIUM CHLORIDE 0.9% FLUSH
10.0000 mL | Freq: Once | INTRAVENOUS | Status: AC
  Administered 2020-03-11: 10 mL via INTRAVENOUS
  Filled 2020-03-11: qty 10

## 2020-03-13 ENCOUNTER — Ambulatory Visit: Payer: Medicare Other | Admitting: Anesthesiology

## 2020-03-13 ENCOUNTER — Encounter
Admission: RE | Disposition: A | Discharge status: Home / Self Care | Payer: Self-pay | Source: Home / Self Care | Attending: Gastroenterology

## 2020-03-13 ENCOUNTER — Ambulatory Visit
Admission: RE | Admit: 2020-03-13 | Discharge: 2020-03-13 | Disposition: A | Discharge status: Home / Self Care | Payer: Medicare Other | Attending: Gastroenterology | Admitting: Gastroenterology

## 2020-03-13 DIAGNOSIS — K635 Polyp of colon: Secondary | ICD-10-CM

## 2020-03-13 DIAGNOSIS — Z1211 Encounter for screening for malignant neoplasm of colon: Secondary | ICD-10-CM | POA: Diagnosis not present

## 2020-03-13 DIAGNOSIS — C541 Malignant neoplasm of endometrium: Secondary | ICD-10-CM

## 2020-03-13 DIAGNOSIS — D124 Benign neoplasm of descending colon: Secondary | ICD-10-CM | POA: Insufficient documentation

## 2020-03-13 DIAGNOSIS — Z800 Family history of malignant neoplasm of digestive organs: Secondary | ICD-10-CM

## 2020-03-13 DIAGNOSIS — K5790 Diverticulosis of intestine, part unspecified, without perforation or abscess without bleeding: Secondary | ICD-10-CM | POA: Diagnosis not present

## 2020-03-13 DIAGNOSIS — K5730 Diverticulosis of large intestine without perforation or abscess without bleeding: Secondary | ICD-10-CM | POA: Insufficient documentation

## 2020-03-13 DIAGNOSIS — L409 Psoriasis, unspecified: Secondary | ICD-10-CM | POA: Diagnosis not present

## 2020-03-13 DIAGNOSIS — M1990 Unspecified osteoarthritis, unspecified site: Secondary | ICD-10-CM | POA: Diagnosis not present

## 2020-03-13 DIAGNOSIS — Z87891 Personal history of nicotine dependence: Secondary | ICD-10-CM | POA: Diagnosis not present

## 2020-03-13 HISTORY — PX: COLONOSCOPY WITH PROPOFOL: SHX5780

## 2020-03-13 SURGERY — COLONOSCOPY WITH PROPOFOL
Anesthesia: General

## 2020-03-13 MED ORDER — PROPOFOL 500 MG/50ML IV EMUL
INTRAVENOUS | Status: DC | PRN
  Administered 2020-03-13: 125 ug/kg/min via INTRAVENOUS

## 2020-03-13 MED ORDER — PROPOFOL 500 MG/50ML IV EMUL
INTRAVENOUS | Status: AC
  Filled 2020-03-13: qty 50

## 2020-03-13 MED ORDER — LIDOCAINE HCL (PF) 2 % IJ SOLN
INTRAMUSCULAR | Status: AC
  Filled 2020-03-13: qty 10

## 2020-03-13 MED ORDER — SODIUM CHLORIDE 0.9 % IV SOLN
INTRAVENOUS | Status: DC
  Administered 2020-03-13: 1000 mL via INTRAVENOUS

## 2020-03-13 MED ORDER — PROPOFOL 10 MG/ML IV BOLUS
INTRAVENOUS | Status: DC | PRN
  Administered 2020-03-13: 50 mg via INTRAVENOUS

## 2020-03-13 MED ORDER — LIDOCAINE HCL (CARDIAC) PF 100 MG/5ML IV SOSY
PREFILLED_SYRINGE | INTRAVENOUS | Status: DC | PRN
  Administered 2020-03-13: 60 mg via INTRAVENOUS

## 2020-03-13 NOTE — H&P (Signed)
Jonathon Bellows, MD [address] [place], [place], [place], [address] [id number], [place], [place], [place], [address] Phone: [phone]  Fax: [phone]  Primary Care Physician:  Margo Common, PA   Pre-Procedure History & Physical: HPI:  DESIRAY KRUIZENGA is a 69 y.o. female is here for an colonoscopy.   Past Medical History:  Diagnosis Date  . Arthritis   . Cancer [facility])    endometrial  . Endometrial mass   . History of chicken pox   . History of kidney stones   . History of measles   . History of mumps   . Osteoporosis   . Personal history of chemotherapy   . Personal history of radiation therapy   . PMB (postmenopausal bleeding)   . Psoriasis     Past Surgical History:  Procedure Laterality Date  . APPENDECTOMY  1960  . BREAST EXCISIONAL BIOPSY Left 1980   neg  . BREAST SURGERY Left    biopsy  . Injection varicose veins in legs Bilateral 2010   Dr. Hulda Humphrey  . LITHOTRIPSY  1994, 2004   for renal stones  . PORTA CATH INSERTION N/A 04/24/2018   Procedure: PORTA CATH INSERTION;  Surgeon: Katha Cabal, MD;  Location: [place] [facility];  Service: Cardiovascular;  Laterality: N/A;  . ROBOTIC ASSISTED TOTAL HYSTERECTOMY Bilateral 04/04/2018   Procedure: ROBOTIC ASSISTED TOTAL HYSTERECTOMY,SENTINEL LYMPH NODE MAPPING AND BIOPSIES,AORTIC LYMPH NODE DISSECTION;  Surgeon: Gillis Ends, MD;  Location: [facility] ORS;  Service: Gynecology;  Laterality: Bilateral;  . TUBAL LIGATION  1980    Prior to Admission medications   Medication Sig Start Date End Date Taking? Authorizing Provider  betamethasone dipropionate (DIPROLENE) 0.05 % cream Apply 1 application topically 2 (two) times daily as needed (for psoriasis on body). 01/28/19  Yes Chrismon, Vickki Muff, PA  desonide (DESOWEN) 0.05 % cream Apply 1 application topically 2 (two) times daily as needed (for psoriasis on face). 01/28/19  Yes Chrismon, Vickki Muff, PA  ibuprofen (ADVIL) 600 MG tablet Take 1 tablet  (600 mg total) by mouth every 8 (eight) hours as needed for moderate pain. 02/04/20  Yes Duffy Bruce, MD  lidocaine-prilocaine (EMLA) cream Apply to affected area once 10/16/19  Yes Earlie Server, MD  Multiple Vitamins-Minerals (CENTRUM SILVER ADULT 50+ PO) Take 1 tablet by mouth daily.   Yes [provider]  naproxen sodium (ALEVE) 220 MG tablet Take 220 mg by mouth daily as needed.    [provider]    Allergies as of 02/24/2020  . (No Known Allergies)    Family History  Problem Relation Age of Onset  . Breast cancer Mother   . Transient ischemic attack Mother   . Arthritis Mother   . Hypothyroidism Mother   . Lung cancer Mother   . Diabetes Father        type 2  . Hypertension Father   . Arthritis Father   . Colon cancer Sister   . Prostate cancer Brother   . Hyperlipidemia Son   . Stomach cancer Maternal Grandmother   . Stroke Maternal Grandfather     Social History   Socioeconomic History  . Marital status: Single    Spouse name: Not on file  . Number of children: 1  . Years of education: Not on file  . Highest education level: Not on file  Occupational History  . Occupation: Employed    Comment: Works at a [employer] doing [place]  Use  . Smoking status: Former Smoker    Packs/day: 0.25    Types: Cigarettes    Quit date: 12/08/2018    Years since quitting: 1.2  . Smokeless tobacco: Never Used  Substance and Sexual Activity  . Alcohol use: No    Alcohol/week: 0.0 standard drinks  . Drug use: No  . Sexual activity: Not Currently  Other Topics Concern  . Not on file  Social History Narrative  . Not on file   Social Determinants of Health   Financial Resource Strain:   . Difficulty of Paying Living Expenses:   Food Insecurity:   . Worried About Charity fundraiser in the Last Year:   . Arboriculturist in the Last Year:   Transportation Needs:   . Film/video editor (Medical):   Marland Kitchen Lack of Transportation (Non-Medical):     Physical Activity:   . Days of Exercise per Week:   . Minutes of Exercise per Session:   Stress:   . Feeling of Stress :   Social Connections:   . Frequency of Communication with Friends and Family:   . Frequency of Social Gatherings with Friends and Family:   . Attends Religious Services:   . Active Member of Clubs or Organizations:   . Attends Archivist Meetings:   Marland Kitchen Marital Status:   Intimate Partner Violence:   . Fear of Current or Ex-Partner:   . Emotionally Abused:   Marland Kitchen Physically Abused:   . Sexually Abused:     Review of Systems: See HPI, otherwise negative ROS  Physical Exam: BP (!) 188/65   Pulse 76   Temp (!) 97.1 F (36.2 C) (Temporal)   Resp 18   Ht 5\' 7"  (1.702 m)   Wt 85.3 kg   SpO2 97%   BMI 29.44 kg/m  General:   Alert,  pleasant and cooperative in NAD Head:  Normocephalic and atraumatic. Neck:  Supple; no masses or thyromegaly. Lungs:  Clear throughout to auscultation, normal respiratory effort.    Heart:  +S1, +S2, Regular rate and rhythm, No edema. Abdomen:  Soft, nontender and nondistended. Normal bowel sounds, without guarding, and without rebound.   Neurologic:  Alert and  oriented x4;  grossly normal neurologically.  Impression/Plan: NEDA STUTHEIT is here for an colonoscopy to be performed for Screening colonoscopy sister had colon cancer  Risks, benefits, limitations, and alternatives regarding  colonoscopy have been reviewed with the patient.  Questions have been answered.  All parties agreeable.   Jonathon Bellows, MD  03/13/2020, 10:52 AM

## 2020-03-13 NOTE — Op Note (Signed)
[facility] Gastroenterology Patient Name: Katherine Snyder Procedure Date: 03/13/2020 10:59 AM MRN: [id number] Account #: [id number] Date of Birth: 1951-05-21 Admit Type: Outpatient Age: 69 Room: [facility] ENDO ROOM 2 Gender: Female Note Status: Finalized Procedure:             Colonoscopy Indications:           Screening in patient at increased risk: Family history                         of 1st-degree relative with colorectal cancer Providers:             Jonathon Bellows MD, MD Referring MD:          Vickki Muff. Chrismon, MD (Referring MD) Medicines:             Monitored Anesthesia Care Complications:         No immediate complications. Procedure:             Pre-Anesthesia Assessment:                        - Prior to the procedure, a History and Physical was                         performed, and patient medications, allergies and                         sensitivities were reviewed. The patient's tolerance                         of previous anesthesia was reviewed.                        - The risks and benefits of the procedure and the                         sedation options and risks were discussed with the                         patient. All questions were answered and informed                         consent was obtained.                        - ASA Grade Assessment: II - A patient with mild                         systemic disease.                        After obtaining informed consent, the colonoscope was                         passed under direct vision. Throughout the procedure,                         the patient's blood pressure, pulse, and oxygen                         saturations were  monitored continuously. The                         Colonoscope was introduced through the anus and                         advanced to the the cecum, identified by the                         appendiceal orifice. The colonoscopy was performed                         with  ease. The patient tolerated the procedure well.                         The quality of the bowel preparation was good. Findings:      The perianal and digital rectal examinations were normal.      Multiple small-mouthed diverticula were found in the sigmoid colon.      Two sessile polyps were found in the descending colon. The polyps were 4       to 9 mm in size. These polyps were removed with a cold snare. Resection       and retrieval were complete. To prevent bleeding after the polypectomy,       one hemostatic clip was successfully placed. There was no bleeding at       the end of the procedure.      The exam was otherwise without abnormality. Impression:            - Diverticulosis in the sigmoid colon.                        - Two 4 to 9 mm polyps in the descending colon,                         removed with a cold snare. Resected and retrieved.                         Clip was placed.                        - The examination was otherwise normal. Recommendation:        - Discharge patient to home (with escort).                        - Resume previous diet.                        - Continue present medications.                        - Await pathology results.                        - Repeat colonoscopy for surveillance based on                         pathology results. Procedure Code(s):     --- Professional ---  45385, Colonoscopy, flexible; with removal of                         tumor(s), polyp(s), or other lesion(s) by snare                         technique Diagnosis Code(s):     --- Professional ---                        Z80.0, Family history of malignant neoplasm of                         digestive organs                        K63.5, Polyp of colon                        K57.30, Diverticulosis of large intestine without                         perforation or abscess without bleeding CPT copyright 2019 American Medical Association. All rights  reserved. The codes documented in this report are preliminary and upon coder review may  be revised to meet current compliance requirements. Jonathon Bellows, MD Jonathon Bellows MD, MD 03/13/2020 11:26:17 AM This report has been signed electronically. Number of Addenda: 0 Note Initiated On: 03/13/2020 10:59 AM Scope Withdrawal Time: 0 hours 13 minutes 46 seconds  Total Procedure Duration: 0 hours 19 minutes 54 seconds  Estimated Blood Loss:  Estimated blood loss: none.      [facility]

## 2020-03-13 NOTE — Transfer of Care (Signed)
Immediate Anesthesia Transfer of Care Note  Patient: Katherine Snyder  Procedure(s) Performed: COLONOSCOPY WITH PROPOFOL (N/A )  Patient Location: PACU and Endoscopy Unit  Anesthesia Type:General  Level of Consciousness: awake and drowsy  Airway & Oxygen Therapy: Patient Spontanous Breathing  Post-op Assessment: Report given to RN and Post -op Vital signs reviewed and stable  Post vital signs: Reviewed and stable  Last Vitals:  Vitals Value Taken Time  BP    Temp    Pulse    Resp 22 03/13/20 1130  SpO2    Vitals shown include unvalidated device data.  Last Pain:  Vitals:   03/13/20 1002  TempSrc: Temporal  PainSc: 0-No pain         Complications: No apparent anesthesia complications

## 2020-03-13 NOTE — Anesthesia Preprocedure Evaluation (Signed)
Anesthesia Evaluation  Patient identified by MRN, date of birth, ID band Patient awake    Reviewed: Allergy & Precautions, H&P , NPO status , Patient's Chart, lab work & pertinent test results, reviewed documented beta blocker date and time   Airway Mallampati: II   Neck ROM: full    Dental  (+) Poor Dentition, Teeth Intact   Pulmonary COPD, Patient abstained from smoking., former smoker,    Pulmonary exam normal        Cardiovascular Exercise Tolerance: Good negative cardio ROS Normal cardiovascular exam Rhythm:regular Rate:Normal     Neuro/Psych negative neurological ROS  negative psych ROS   GI/Hepatic negative GI ROS, Neg liver ROS,   Endo/Other  negative endocrine ROS  Renal/GU negative Renal ROS  negative genitourinary   Musculoskeletal   Abdominal   Peds  Hematology negative hematology ROS (+)   Anesthesia Other Findings Past Medical History: No date: Arthritis No date: Cancer ([place])     Comment:  endometrial No date: Endometrial mass No date: History of chicken pox No date: History of kidney stones No date: History of measles No date: History of mumps No date: Osteoporosis No date: Personal history of chemotherapy No date: Personal history of radiation therapy No date: PMB (postmenopausal bleeding) No date: Psoriasis Past Surgical History: 1960: APPENDECTOMY 1980: BREAST EXCISIONAL BIOPSY; Left     Comment:  neg No date: BREAST SURGERY; Left     Comment:  biopsy 2010: Injection varicose veins in legs; Bilateral     Comment:  Dr. Hulda Humphrey 1994, 2004: LITHOTRIPSY     Comment:  for renal stones 04/24/2018: PORTA CATH INSERTION; N/A     Comment:  Procedure: PORTA CATH INSERTION;  Surgeon: Katha Cabal, MD;  Location: [place] CV LAB;  Service:              Cardiovascular;  Laterality: N/A; 04/04/2018: ROBOTIC ASSISTED TOTAL HYSTERECTOMY; Bilateral     Comment:  Procedure:  ROBOTIC ASSISTED TOTAL HYSTERECTOMY,SENTINEL               LYMPH NODE MAPPING AND BIOPSIES,AORTIC LYMPH NODE               DISSECTION;  Surgeon: Gillis Ends, MD;                Location: [facility] ORS;  Service: Gynecology;  Laterality:               Bilateral; 1980: TUBAL LIGATION   Reproductive/Obstetrics negative OB ROS                             Anesthesia Physical Anesthesia Plan  ASA: III  Anesthesia Plan: General   Post-op Pain Management:    Induction:   PONV Risk Score and Plan:   Airway Management Planned:   Additional Equipment:   Intra-op Plan:   Post-operative Plan:   Informed Consent: I have reviewed the patients History and Physical, chart, labs and discussed the procedure including the risks, benefits and alternatives for the proposed anesthesia with the patient or authorized representative who has indicated his/her understanding and acceptance.     Dental Advisory Given  Plan Discussed with: CRNA  Anesthesia Plan Comments:         Anesthesia Quick Evaluation

## 2020-03-14 NOTE — Anesthesia Postprocedure Evaluation (Signed)
Anesthesia Post Note  Patient: Katherine Snyder  Procedure(s) Performed: COLONOSCOPY WITH PROPOFOL (N/A )  Patient location during evaluation: Endoscopy Anesthesia Type: General Level of consciousness: awake and alert Pain management: pain level controlled Vital Signs Assessment: post-procedure vital signs reviewed and stable Respiratory status: spontaneous breathing, nonlabored ventilation, respiratory function stable and patient connected to nasal cannula oxygen Cardiovascular status: blood pressure returned to baseline and stable Postop Assessment: no apparent nausea or vomiting Anesthetic complications: no     Last Vitals:  Vitals:   03/13/20 1151 03/13/20 1201  BP: (!) 118/58 (!) 121/56  Pulse: (!) 54 (!) 45  Resp: 19 15  Temp:    SpO2: 96% 96%    Last Pain:  Vitals:   03/13/20 1201  TempSrc:   PainSc: 0-No pain                 Arita Miss

## 2020-03-16 ENCOUNTER — Encounter: Payer: Self-pay | Admitting: *Deleted

## 2020-03-17 ENCOUNTER — Encounter: Payer: Self-pay | Admitting: Gastroenterology

## 2020-03-18 ENCOUNTER — Ambulatory Visit
Admission: RE | Admit: 2020-03-18 | Discharge: 2020-03-18 | Disposition: A | Discharge status: Home / Self Care | Payer: Medicare Other | Source: Ambulatory Visit | Attending: Family Medicine | Admitting: Family Medicine

## 2020-03-18 ENCOUNTER — Other Ambulatory Visit: Payer: Self-pay

## 2020-03-18 DIAGNOSIS — E041 Nontoxic single thyroid nodule: Secondary | ICD-10-CM | POA: Insufficient documentation

## 2020-03-18 NOTE — Procedures (Signed)
Interventional Radiology Procedure Note  Procedure: [place] LEFT TR 4 NODULE FNA BX  Complications: None  Estimated Blood Loss: MIN  Findings: 25G FNA X 5

## 2020-03-18 NOTE — Discharge Instructions (Signed)

## 2020-03-19 LAB — CYTOLOGY - NON PAP

## 2020-04-01 ENCOUNTER — Other Ambulatory Visit: Payer: Self-pay

## 2020-04-01 ENCOUNTER — Ambulatory Visit
Admission: RE | Admit: 2020-04-01 | Discharge: 2020-04-01 | Disposition: A | Discharge status: Home / Self Care | Payer: Medicare Other | Source: Ambulatory Visit | Attending: Radiation Oncology | Admitting: Radiation Oncology

## 2020-04-01 ENCOUNTER — Other Ambulatory Visit: Payer: Self-pay | Admitting: *Deleted

## 2020-04-01 ENCOUNTER — Encounter: Payer: Self-pay | Admitting: Radiation Oncology

## 2020-04-01 VITALS — BP 124/73 | HR 63 | Temp 96.8°F | Resp 16 | Wt 184.0 lb

## 2020-04-01 DIAGNOSIS — C541 Malignant neoplasm of endometrium: Secondary | ICD-10-CM

## 2020-04-01 DIAGNOSIS — Z923 Personal history of irradiation: Secondary | ICD-10-CM | POA: Insufficient documentation

## 2020-04-01 DIAGNOSIS — R911 Solitary pulmonary nodule: Secondary | ICD-10-CM | POA: Insufficient documentation

## 2020-04-01 DIAGNOSIS — Z90710 Acquired absence of both cervix and uterus: Secondary | ICD-10-CM | POA: Insufficient documentation

## 2020-04-01 DIAGNOSIS — R32 Unspecified urinary incontinence: Secondary | ICD-10-CM | POA: Insufficient documentation

## 2020-04-01 NOTE — Progress Notes (Signed)
Radiation Oncology Follow up Note  Name: Katherine Snyder   Date:   04/01/2020 MRN:  [id number] DOB: Sep 07, 1951    This 69 y.o. 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  Seen today in routine follow-up she is doing well.  Her major concern is continued urinary incontinence since her surgery.  She specifically denies pelvic pain vaginal discharge or bleeding..  Patient had CT scan back in December showing 2 new right upper lobe nodules which had resolved consistent with benign etiology.  She also additional small nodules bilaterally which were unchanged.  Patient recently had thyroid biopsy which was benign of the left mid lobe.  COMPLICATIONS OF TREATMENT: none  FOLLOW UP COMPLIANCE: keeps appointments   PHYSICAL EXAM:  BP 124/73 (BP Location: Left Arm, Patient Position: Sitting, Cuff Size: Normal)   Pulse 63   Temp (!) 96.8 F (36 C) (Tympanic)   Resp 16   Wt 184 lb (83.5 kg)   BMI 28.82 kg/m  On speculum examination vaginal vault is clear there is some pallor to the vaginal apex.  Bimanual examination shows no evidence of vaginal mass or nodularity or parametrial disease.  Well-developed well-nourished patient in NAD. HEENT reveals PERLA, EOMI, discs not visualized.  Oral cavity is clear. No oral mucosal lesions are identified. Neck is clear without evidence of cervical or supraclavicular adenopathy. Lungs are clear to A&P. Cardiac examination is essentially unremarkable with regular rate and rhythm without murmur rub or thrill. Abdomen is benign with no organomegaly or masses noted. Motor sensory and DTR levels are  equal and symmetric in the upper and lower extremities. Cranial nerves II through XII are grossly intact. Proprioception is intact. No peripheral adenopathy or edema is identified. No motor or sensory levels are noted. Crude visual fields are within normal range.  RADIOLOGY RESULTS: CT scan of chest reviewed compatible with above-stated findings  PLAN: Present time patient is doing well.  I am referring her to Dr. Hollice Espy for evaluation of her urinary incontinence. she continues close follow-up care with GYN oncology.  I have asked to see her back in 1 year for follow-up.  I have asked her to forward any future CT scan for my evaluation.  Patient is to call with any concerns.  I would like to take this opportunity to thank you for allowing me to participate in the care of your patient.Noreene Filbert, MD

## 2020-04-13 ENCOUNTER — Encounter: Payer: Self-pay | Admitting: Oncology

## 2020-04-13 ENCOUNTER — Inpatient Hospital Stay: Payer: Medicare Other | Attending: Oncology

## 2020-04-13 ENCOUNTER — Telehealth: Payer: Self-pay | Admitting: *Deleted

## 2020-04-13 ENCOUNTER — Inpatient Hospital Stay (HOSPITAL_BASED_OUTPATIENT_CLINIC_OR_DEPARTMENT_OTHER): Payer: Medicare Other | Admitting: Oncology

## 2020-04-13 ENCOUNTER — Other Ambulatory Visit: Payer: Self-pay

## 2020-04-13 VITALS — BP 122/77 | HR 58 | Temp 96.6°F | Resp 16 | Wt 185.3 lb

## 2020-04-13 DIAGNOSIS — E041 Nontoxic single thyroid nodule: Secondary | ICD-10-CM | POA: Insufficient documentation

## 2020-04-13 DIAGNOSIS — C55 Malignant neoplasm of uterus, part unspecified: Secondary | ICD-10-CM

## 2020-04-13 DIAGNOSIS — Z452 Encounter for adjustment and management of vascular access device: Secondary | ICD-10-CM | POA: Diagnosis not present

## 2020-04-13 DIAGNOSIS — R918 Other nonspecific abnormal finding of lung field: Secondary | ICD-10-CM | POA: Diagnosis not present

## 2020-04-13 DIAGNOSIS — C541 Malignant neoplasm of endometrium: Secondary | ICD-10-CM | POA: Insufficient documentation

## 2020-04-13 DIAGNOSIS — G629 Polyneuropathy, unspecified: Secondary | ICD-10-CM

## 2020-04-13 DIAGNOSIS — Z87891 Personal history of nicotine dependence: Secondary | ICD-10-CM | POA: Insufficient documentation

## 2020-04-13 DIAGNOSIS — Z95828 Presence of other vascular implants and grafts: Secondary | ICD-10-CM | POA: Diagnosis not present

## 2020-04-13 LAB — COMPREHENSIVE METABOLIC PANEL
ALT: 15 U/L (ref 0–44)
AST: 20 U/L (ref 15–41)
Albumin: 4 g/dL (ref 3.5–5.0)
Alkaline Phosphatase: 67 U/L (ref 38–126)
Anion gap: 10 (ref 5–15)
BUN: 20 mg/dL (ref 8–23)
CO2: 26 mmol/L (ref 22–32)
Calcium: 9.2 mg/dL (ref 8.9–10.3)
Chloride: 103 mmol/L (ref 98–111)
Creatinine, Ser: 0.82 mg/dL (ref 0.44–1.00)
GFR calc Af Amer: >60 mL/min (ref >60)
GFR calc non Af Amer: >60 mL/min (ref >60)
Glucose, Bld: 99 mg/dL (ref 70–99)
Potassium: 4.4 mmol/L (ref 3.5–5.1)
Sodium: 139 mmol/L (ref 135–145)
Total Bilirubin: 0.7 mg/dL (ref 0.3–1.2)
Total Protein: 7.5 g/dL (ref 6.5–8.1)

## 2020-04-13 LAB — CBC WITH DIFFERENTIAL/PLATELET
Abs Immature Granulocytes: 0.02 10*3/uL (ref 0.00–0.07)
Basophils Absolute: 0.1 10*3/uL (ref 0.0–0.1)
Basophils Relative: 1 %
Eosinophils Absolute: 0.2 10*3/uL (ref 0.0–0.5)
Eosinophils Relative: 2 %
HCT: 38.4 % (ref 36.0–46.0)
Hemoglobin: 12.8 g/dL (ref 12.0–15.0)
Immature Granulocytes: 0 %
Lymphocytes Relative: 30 %
Lymphs Abs: 2.2 10*3/uL (ref 0.7–4.0)
MCH: 29.6 pg (ref 26.0–34.0)
MCHC: 33.3 g/dL (ref 30.0–36.0)
MCV: 88.9 fL (ref 80.0–100.0)
Monocytes Absolute: 0.6 10*3/uL (ref 0.1–1.0)
Monocytes Relative: 9 %
Neutro Abs: 4.2 10*3/uL (ref 1.7–7.7)
Neutrophils Relative %: 58 %
Platelets: 226 10*3/uL (ref 150–400)
RBC: 4.32 MIL/uL (ref 3.87–5.11)
RDW: 12.9 % (ref 11.5–15.5)
WBC: 7.2 10*3/uL (ref 4.0–10.5)
nRBC: 0 % (ref 0.0–0.2)

## 2020-04-13 NOTE — Progress Notes (Signed)
Hematology/Oncology Follow up  note [facility] Telephone:([phone]) [phone] Fax:([phone]) [phone]   Patient Care Team: Chrismon, Vickki Muff, PA as PCP - General (Family Medicine) Mellody Drown, MD as Referring Physician (Obstetrics and Gynecology) Gillis Ends, MD as Referring Physician (Obstetrics and Gynecology) Clent Jacks, RN as Registered Nurse Noreene Filbert, MD as Referring Physician (Radiation Oncology) Earlie Server, MD as Consulting Physician (Oncology) Schnier, Dolores Lory, MD (Vascular Surgery) REASON FOR VISIT Follow up for treatment of uterus carcinosarcoma  HISTORY OF PRESENTING ILLNESS:  Katherine Snyder is a  69 y.o.  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 Pathology showed  A uterus with cervix hysterectomy -Carcinosarcoma B sentinel lymph node, right primary obturator negative C lymph node right external iliac lymph negative D fallopian tube and ovary, unilateral right negative E sentinel lymph node right low periaortic: Negative F sentinel lymph node left low periaortic negative  CANCER CASE SUMMARY: ENDOMETRIUM  Procedure: Total hysterectomy and bilateral salpingo-oophorectomy  Histologic Type: Carcinosarcoma  Histologic Grade: Not applicable  Myometrial Invasion: Present    Depth of myometrial invasion cannot be determined due to exophytic  tumor growth and effacement of endometrial/myometrial junction    Myometrial thickness: At least 8 mm    Percentage depth of myometrial invasion: Estimated less than 50%  Uterine Serosa Involvement: Not identified  Cervical Stromal Involvement: Not identified  Other Tissue/Organ Involvement: Not identified  Peritoneal/ Ascitic Fluid: Negative for malignancy  Lymphovascular Invasion: Present    Regional Lymph Nodes: All lymph nodes negative for tumor cells  Number of Lymph Nodes Examined: 5    Total number of pelvic nodes examined: 2    Number of pelvic sentinel nodes examined: 1    Total number of para-aortic nodes examined: 3    Number of para-aortic sentinel nodes examined: 3   03/01/2018 Pathologic Stage Classification (pTNM, AJCC 8th Edition) (Note J): pT1a  pN0/FIGO IA   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   Pertinent oncology history Katherine Snyder is a 69 y.o. female who has above history reviewed by me today presents for assessment prior to Cycle 6 adjuvant chemotherapy management for stage Ia endometrial carcinosarcoma. During cycle 1 treatment, reported feeling burning down and having really bad heartburn.  Taxol was temporarily stopped.  The patient was given Benadryl, Solu-Medrol, Pepcid, symptoms improved and resolved.  Taxol was restarted and patient is able to finish her treatment. Patient reports feeling extremely tired for 2 days after chemotherapy.  She was seen by nurse practitioner Sonia Baller during interval.   She complained burning sensation with urination and had UA and urine culture obtained.  UA was not convincing for UTI urine culture showed less than 10,000 colonies of insignificant growth.  Patient remains afebrile. Patient also reports her chronic fasciitis of foot has got a lot worse since the start of chemotherapy.  Possible neuropathy was discussed and the patient is reluctant to start gabapentin or Cymbalta.  She was given tramadol 100 mg twice daily.  Patient reports symptoms gradually improved.    Patient was found to have neutropenia so she received Neupogen x 2. She reports bone pain started after  Neupogen shots.   Marland Kitchen#Lung  nodules # 12/10/2018- CT Chest wo contrast -New sub solid density measuring 12 mm with associated 3 mm solid nodule is noted in left upper lobe.Stable sub solid density is noted in right upper lobe with grossly stable 4 mm solid nodule. Multiple other pulmonary nodules are noted in both lungs which are stable compared to prior exam  Case was discussed on tumor board and recommend surveillance CT in 3 months.  Patient had follow-up CT chest done and present for discussion of CT results.   INTERVAL HISTORY Katherine Snyder is a 69 y.o. female who has above history reviewed by me presents for assessment prior to Cycle 6 adjuvant chemotherapy for stage Ia endometrial carcinosarcoma.  Problems and complaints are listed below: She reports doing well. No new complaints.  Thyroid nodule, s/p thyroid nodule FNA. Pathology is pending.  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  Was seen by radiation oncology recently.     Review of Systems  Constitutional: Negative for chills, fever, malaise/fatigue and weight loss.  HENT: Negative for congestion, ear discharge, ear pain, nosebleeds, sinus pain and sore throat.   Eyes: Negative for double vision, photophobia, pain, discharge and redness.  Respiratory: Negative for cough, hemoptysis, sputum production, shortness of breath and wheezing.   Cardiovascular: Negative for chest pain, palpitations, orthopnea, claudication and leg swelling.  Gastrointestinal: Negative for abdominal pain, blood in stool, constipation, diarrhea, heartburn, melena, nausea and vomiting.  Genitourinary: Negative for dysuria, flank pain, frequency and hematuria.  Musculoskeletal: Negative for back pain, myalgias and neck pain.  Skin: Negative for itching and rash.  Neurological: Negative for dizziness, tingling, tremors, focal weakness, weakness and headaches.       Bilateral foot burning sensation/ Pain improved.   Endo/Heme/Allergies: Negative for environmental allergies.  Does not bruise/bleed easily.  Psychiatric/Behavioral: Negative for depression, hallucinations and memory loss. The patient is not nervous/anxious.     MEDICAL HISTORY:  Past Medical History:  Diagnosis Date  . Arthritis   . Cancer [facility])    endometrial  . Endometrial mass   . History of chicken pox   . History of kidney stones   . History of measles   . History of mumps   . Osteoporosis   . Personal history of chemotherapy   . Personal history of radiation therapy   . PMB (postmenopausal bleeding)   . Psoriasis     SURGICAL HISTORY: Past Surgical History:  Procedure Laterality Date  . APPENDECTOMY  1960  . BREAST EXCISIONAL BIOPSY Left 1980   neg  . BREAST SURGERY Left    biopsy  . COLONOSCOPY WITH PROPOFOL N/A 03/13/2020   Procedure: COLONOSCOPY WITH PROPOFOL;  Surgeon: Jonathon Bellows, MD;  Location: [facility] ENDOSCOPY;  Service: Gastroenterology;  Laterality: N/A;  . Injection varicose veins in legs Bilateral 2010   Dr. Hulda Humphrey  . LITHOTRIPSY  1994, 2004   for renal stones  . PORTA CATH INSERTION N/A 04/24/2018   Procedure: PORTA CATH INSERTION;  Surgeon: Katha Cabal, MD;  Location: [place] CV LAB;  Service: Cardiovascular;  Laterality: N/A;  . ROBOTIC ASSISTED TOTAL HYSTERECTOMY Bilateral 04/04/2018   Procedure: ROBOTIC ASSISTED TOTAL HYSTERECTOMY,SENTINEL LYMPH NODE MAPPING AND BIOPSIES,AORTIC LYMPH NODE DISSECTION;  Surgeon: Gillis Ends, MD;  Location: [facility] ORS;  Service: Gynecology;  Laterality: Bilateral;  . TUBAL LIGATION  1980    SOCIAL HISTORY: Social History   Socioeconomic History  . Marital status: Single    Spouse name: Not on file  .  Number of children: 1  . Years of education: Not on file  . Highest education level: Not on file  Occupational History  . Occupation: Employed    Comment: Works at a [employer] doing [employer]  Tobacco Use  . Smoking status: Former Smoker    Packs/day: 0.25    Types: Cigarettes    Quit date: 12/08/2018     Years since quitting: 1.3  . Smokeless tobacco: Never Used  Substance and Sexual Activity  . Alcohol use: No    Alcohol/week: 0.0 standard drinks  . Drug use: No  . Sexual activity: Not Currently  Other Topics Concern  . Not on file  Social History Narrative  . Not on file   Social Determinants of Health   Financial Resource Strain:   . Difficulty of Paying Living Expenses:   Food Insecurity:   . Worried About Charity fundraiser in the Last Year:   . Arboriculturist in the Last Year:   Transportation Needs:   . Film/video editor (Medical):   Marland Kitchen Lack of Transportation (Non-Medical):   Physical Activity:   . Days of Exercise per Week:   . Minutes of Exercise per Session:   Stress:   . Feeling of Stress :   Social Connections:   . Frequency of Communication with Friends and Family:   . Frequency of Social Gatherings with Friends and Family:   . Attends Religious Services:   . Active Member of Clubs or Organizations:   . Attends Archivist Meetings:   Marland Kitchen Marital Status:   Intimate Partner Violence:   . Fear of Current or Ex-Partner:   . Emotionally Abused:   Marland Kitchen Physically Abused:   . Sexually Abused:     FAMILY HISTORY: Family History  Problem Relation Age of Onset  . Breast cancer Mother   . Transient ischemic attack Mother   . Arthritis Mother   . Hypothyroidism Mother   . Lung cancer Mother   . Diabetes Father        type 2  . Hypertension Father   . Arthritis Father   . Colon cancer Sister   . Prostate cancer Brother   . Hyperlipidemia Son   . Stomach cancer Maternal Grandmother   . Stroke Maternal Grandfather     ALLERGIES:  has No Known Allergies.  MEDICATIONS:  Current Outpatient Medications  Medication Sig Dispense Refill  . betamethasone dipropionate (DIPROLENE) 0.05 % cream Apply 1 application topically 2 (two) times daily as needed (for psoriasis on body). 30 g 1  . desonide (DESOWEN) 0.05 % cream Apply 1 application topically 2  (two) times daily as needed (for psoriasis on face). 30 g 1  . ibuprofen (ADVIL) 600 MG tablet Take 1 tablet (600 mg total) by mouth every 8 (eight) hours as needed for moderate pain. 20 tablet 0  . lidocaine-prilocaine (EMLA) cream Apply to affected area once 30 g 3  . Multiple Vitamins-Minerals (CENTRUM SILVER ADULT 50+ PO) Take 1 tablet by mouth daily.    . naproxen sodium (ALEVE) 220 MG tablet Take 220 mg by mouth daily as needed.     No current facility-administered medications for this visit.     PHYSICAL EXAMINATION: ECOG PERFORMANCE STATUS: 1 - Symptomatic but completely ambulatory Vitals:   04/13/20 1303  BP: 122/77  Pulse: (!) 58  Resp: 16  Temp: (!) 96.6 F (35.9 C)   Filed Weights   04/13/20 1303  Weight: 185 lb 4.8 oz (  84.1 kg)    Physical Exam Constitutional:      General: She is not in acute distress. HENT:     Head: Normocephalic and atraumatic.     Right Ear: External ear normal.  Eyes:     General: No scleral icterus.    Conjunctiva/sclera: Conjunctivae normal.     Pupils: Pupils are equal, round, and reactive to light.  Cardiovascular:     Rate and Rhythm: Normal rate and regular rhythm.     Heart sounds: Normal heart sounds.  Pulmonary:     Effort: Pulmonary effort is normal. No respiratory distress.     Breath sounds: Normal breath sounds. No wheezing or rales.  Chest:     Chest wall: No tenderness.  Abdominal:     General: Bowel sounds are normal. There is no distension.     Palpations: Abdomen is soft. There is no mass.     Tenderness: There is no abdominal tenderness.  Musculoskeletal:        General: No deformity. Normal range of motion.     Cervical back: Normal range of motion and neck supple.  Lymphadenopathy:     Cervical: No cervical adenopathy.  Skin:    General: Skin is warm and dry.     Findings: No erythema or rash.  Neurological:     Mental Status: She is alert and oriented to person, place, and time. Mental status is at  baseline.     Cranial Nerves: No cranial nerve deficit.     Coordination: Coordination normal.     Deep Tendon Reflexes: Reflexes normal.  Psychiatric:        Mood and Affect: Mood normal.      LABORATORY DATA:  I have reviewed the data as listed Lab Results  Component Value Date   WBC 7.2 04/13/2020   HGB 12.8 04/13/2020   HCT 38.4 04/13/2020   MCV 88.9 04/13/2020   PLT 226 04/13/2020   Recent Labs    10/16/19 0947 02/25/20 1203 04/13/20 1251  NA 139 140 139  K 3.6 4.2 4.4  CL 106 102 103  CO2 26 23 26   GLUCOSE 98 87 99  BUN 18 16 20   CREATININE 0.78 0.85 0.82  CALCIUM 8.9 9.3 9.2  GFRNONAA >60 72 >60  GFRAA >60 83 >60  PROT 7.2 6.9 7.5  ALBUMIN 4.0 4.4 4.0  AST 17 15 20   ALT 15 12 15   ALKPHOS 64 86 67  BILITOT 0.7 0.4 0.7   Iron/TIBC/Ferritin/ %Sat No results found for: IRON, TIBC, FERRITIN, IRONPCTSAT   RADIOGRAPHIC STUDIES: I have personally reviewed the radiological images as listed and agreed with the findings in the report. DG Elbow 2 Views Right  Result Date: 02/03/2020 CLINICAL DATA:  Tripped and fell on right arm, pain, limited range of motion EXAM: RIGHT ELBOW - 2 VIEW COMPARISON:  None. FINDINGS: Frontal and lateral views of the right elbow are obtained. There are no acute displaced fractures. Alignment is anatomic. Joint spaces are well preserved. Mild enthesopathic changes along the olecranon. There is elevation of the anterior fat pad, nonspecific. Posterior fat pad is not elevated, and there is no convincing evidence for effusion. The soft tissues are unremarkable. IMPRESSION: 1. No acute displaced fracture. Electronically Signed   By: Randa Ngo M.D.   On: 02/03/2020 22:08   US Thyroid Biopsy  Result Date: 03/18/2020 INDICATION: Indeterminate thyroid nodule EXAM: ULTRASOUND GUIDED FINE NEEDLE ASPIRATION OF INDETERMINATE THYROID NODULE COMPARISON:  03/04/2020 MEDICATIONS: 1% LIDOCAINE COMPLICATIONS:  None immediate. TECHNIQUE: Informed written  consent was obtained from the patient after a discussion of the risks, benefits and alternatives to treatment. Questions regarding the procedure were encouraged and answered. A timeout was performed prior to the initiation of the procedure. Pre-procedural ultrasound scanning demonstrated unchanged size and appearance of the indeterminate nodule within the LEFT MID THYROID The procedure was planned. The neck was prepped in the usual sterile fashion, and a sterile drape was applied covering the operative field. A timeout was performed prior to the initiation of the procedure. Local anesthesia was provided with 1% lidocaine. Under direct ultrasound guidance, 5 FNA biopsies were performed of the 2.5 cm left mid thyroid TR 4 nodule (nodule 2) with a 25 gauge needle. Multiple ultrasound images were saved for procedural documentation purposes. The samples were prepared and submitted to pathology. Limited post procedural scanning was negative for hematoma or additional complication. Dressings were placed. The patient tolerated the above procedures procedure well without immediate postprocedural complication. FINDINGS: Nodule reference number based on prior diagnostic ultrasound: 2 Maximum size: 2.5 cm Location: Left; Mid ACR TI-RADS risk category: TR4 (4-6 points) Reason for biopsy: meets ACR TI-RADS criteria Ultrasound imaging confirms appropriate placement of the needles within the thyroid nodule. IMPRESSION: Technically successful ultrasound guided fine needle aspiration of the 2.5 cm left mid thyroid TR 4 nodule Electronically Signed   By: Jerilynn Mages.  Shick M.D.   On: 03/18/2020 14:13   MM 3D SCREEN BREAST BILATERAL  Result Date: 03/04/2020 CLINICAL DATA:  Screening. EXAM: DIGITAL SCREENING BILATERAL MAMMOGRAM WITH TOMO AND CAD COMPARISON:  Previous exam(s). ACR Breast Density Category c: The breast tissue is heterogeneously dense, which may obscure small masses. FINDINGS: There are no findings suspicious for malignancy.  Images were processed with CAD. IMPRESSION: No mammographic evidence of malignancy. A result letter of this screening mammogram will be mailed directly to the patient. RECOMMENDATION: Screening mammogram in one year. (Code:SM-B-01Y) BI-RADS CATEGORY  1: Negative. Electronically Signed   By: Dorise Bullion III M.D   On: 03/04/2020 16:54   US THYROID  Result Date: 03/05/2020 CLINICAL DATA:  Incidental on CT. Left-sided thyroid nodule incidentally noted on chest CT performed 10/14/2019 EXAM: THYROID ULTRASOUND TECHNIQUE: Ultrasound examination of the thyroid gland and adjacent soft tissues was performed. COMPARISON:  Chest CT-10/14/2019 FINDINGS: Parenchymal Echotexture: Mildly heterogenous Isthmus: Normal in size measuring 2 mm in diameter Right lobe: Normal in size measuring 4.2 x 1.5 x 1.4 cm Left lobe: Normal in size measuring 4.8 x 1.8 x 1.9 cm _________________________________________________________ Estimated total number of nodules >/= 1 cm: 1 Number of spongiform nodules >/=  2 cm not described below (TR1): 0 Number of mixed cystic and solid nodules >/= 1.5 cm not described below (TR2): 0 _________________________________________________________ There is an approximately 0.5 x 0.4 x 0.4 cm hypoechoic nodule within the superior pole the right lobe of the thyroid (labeled 1), which does not meet imaging criteria to recommend percutaneous sampling or continued dedicated follow-up _________________________________________________________ Nodule # 2: Location: Left; Mid - this nodule correlates with the nodule seen on preceding chest CT Maximum size: 2.5 cm; Other 2 dimensions: 1.7 x 1.5 cm Composition: solid/almost completely solid (2) Echogenicity: isoechoic (1) Shape: not taller-than-wide (0) Margins: smooth (0) Echogenic foci: macrocalcifications (1) ACR TI-RADS total points: 5. ACR TI-RADS risk category: TR4 (4-6 points). ACR TI-RADS recommendations: **Given size (>/= 1.5 cm) and appearance, fine needle  aspiration of this moderately suspicious nodule should be considered based on TI-RADS criteria. _________________________________________________________ IMPRESSION: Dominant left-sided thyroid  nodule (labeled #2), correlating with the nodule seen on preceding chest CT, meets imaging criteria to recommend percutaneous sampling as clinically indicated. The above is in keeping with the ACR TI-RADS recommendations - J Am Coll Radiol 2017;14:587-595. Electronically Signed   By: Sandi Mariscal M.D.   On: 03/05/2020 07:42     ASSESSMENT & PLAN:  1. Endometrial cancer ([place])   2. Uterine carcinosarcoma ([place])   3. Port-A-Cath in place   4. Neuropathy   Stage IA Uterine carcinosarcoma  Status post 6 cycles adjuvant carboplatinum AUC 5 and Taxol 135 mg/m. Clinically she is doing well.  Labs are reviewed and discussed with patient. Counts are normal.  Continue follow up in 6 months.   # Lung nodules, the new lung nodules have resolved on 10/14/2019  All other nodules are stable.   #Left thyroid nodule, stable from 07/11/2018.   S/p thyroid nodule FNA.- pathology is benign.  # neuropathy ,stable.  #Port-A-Cath in place, continue port flush every 6 to 8 weeks.  It has been 2 years since her surgery. She may discontinue port. She would like to discuss with Dr.Berchuk and decide after that.   We spent sufficient time to discuss many aspect of care, questions were answered to patient's satisfaction. The patient knows to call the clinic with any problems questions or concerns.  Return of visit: 6 months Orders Placed This Encounter  Procedures  . CBC with Differential/Platelet    Standing Status:   Future    Standing Expiration Date:   04/13/2021  . Comprehensive metabolic panel    Standing Status:   Future    Standing Expiration Date:   04/13/2021  . CA 125    Standing Status:   Future    Standing Expiration Date:   04/13/2021     Earlie Server, MD, PhD Hematology Oncology [facility]  [facility] Pager- [phone] 04/13/2020

## 2020-04-13 NOTE — Progress Notes (Signed)
Patient denies new problems/concerns today.   °

## 2020-04-14 LAB — CA 125: Cancer Antigen (CA) 125: 8.9 U/mL (ref 0.0–38.1)

## 2020-04-21 ENCOUNTER — Ambulatory Visit: Payer: Medicare Other | Admitting: Urology

## 2020-05-06 ENCOUNTER — Inpatient Hospital Stay: Payer: Medicare Other

## 2020-05-06 ENCOUNTER — Other Ambulatory Visit: Payer: Self-pay

## 2020-05-06 DIAGNOSIS — C541 Malignant neoplasm of endometrium: Secondary | ICD-10-CM | POA: Diagnosis not present

## 2020-05-06 DIAGNOSIS — E041 Nontoxic single thyroid nodule: Secondary | ICD-10-CM | POA: Diagnosis not present

## 2020-05-06 DIAGNOSIS — G629 Polyneuropathy, unspecified: Secondary | ICD-10-CM | POA: Diagnosis not present

## 2020-05-06 DIAGNOSIS — R918 Other nonspecific abnormal finding of lung field: Secondary | ICD-10-CM | POA: Diagnosis not present

## 2020-05-06 DIAGNOSIS — Z452 Encounter for adjustment and management of vascular access device: Secondary | ICD-10-CM | POA: Diagnosis not present

## 2020-05-06 DIAGNOSIS — Z95828 Presence of other vascular implants and grafts: Secondary | ICD-10-CM

## 2020-05-06 DIAGNOSIS — Z87891 Personal history of nicotine dependence: Secondary | ICD-10-CM | POA: Diagnosis not present

## 2020-05-06 MED ORDER — SODIUM CHLORIDE 0.9% FLUSH
10.0000 mL | INTRAVENOUS | Status: DC | PRN
  Administered 2020-05-06: 10 mL via INTRAVENOUS
  Filled 2020-05-06: qty 10

## 2020-05-06 MED ORDER — HEPARIN SOD (PORK) LOCK FLUSH 100 UNIT/ML IV SOLN
500.0000 [IU] | Freq: Once | INTRAVENOUS | Status: AC
  Administered 2020-05-06: 500 [IU] via INTRAVENOUS
  Filled 2020-05-06: qty 5

## 2020-05-06 MED ORDER — HEPARIN SOD (PORK) LOCK FLUSH 100 UNIT/ML IV SOLN
INTRAVENOUS | Status: AC
  Filled 2020-05-06: qty 5

## 2020-06-07 DIAGNOSIS — R300 Dysuria: Secondary | ICD-10-CM | POA: Diagnosis not present

## 2020-06-07 DIAGNOSIS — N390 Urinary tract infection, site not specified: Secondary | ICD-10-CM | POA: Diagnosis not present

## 2020-06-12 ENCOUNTER — Ambulatory Visit (INDEPENDENT_AMBULATORY_CARE_PROVIDER_SITE_OTHER): Payer: Medicare Other | Admitting: Physician Assistant

## 2020-06-12 ENCOUNTER — Other Ambulatory Visit: Payer: Self-pay

## 2020-06-12 ENCOUNTER — Encounter: Payer: Self-pay | Admitting: Physician Assistant

## 2020-06-12 VITALS — BP 127/76 | HR 67 | Temp 98.3°F | Resp 16 | Wt 186.4 lb

## 2020-06-12 DIAGNOSIS — R3989 Other symptoms and signs involving the genitourinary system: Secondary | ICD-10-CM

## 2020-06-12 LAB — POCT URINALYSIS DIPSTICK
Bilirubin, UA: NEGATIVE
Blood, UA: NEGATIVE
Glucose, UA: NEGATIVE
Ketones, UA: NEGATIVE
Nitrite, UA: NEGATIVE
Odor: POSITIVE
Protein, UA: NEGATIVE
Spec Grav, UA: 1.015 (ref 1.010–1.025)
Urobilinogen, UA: 0.2 E.U./dL
pH, UA: 6 (ref 5.0–8.0)

## 2020-06-12 MED ORDER — SULFAMETHOXAZOLE-TRIMETHOPRIM 800-160 MG PO TABS: 1.0000 | ORAL_TABLET | Freq: Two times a day (BID) | ORAL | 0 refills | Status: DC

## 2020-06-12 NOTE — Progress Notes (Signed)
Established patient visit   Patient: Katherine Snyder   DOB: Sep 26, 1951   69 y.o. Female  MRN: [id number] Visit Date: 06/12/2020  Today's healthcare provider: Mar Daring, PA-C   Chief Complaint  Patient presents with   Dysuria   Subjective    Dysuria  This is a new problem. The current episode started 1 to 4 weeks ago (last weekend. Reports that she went to urgent care and was prescribed Nitrofurantoin 100mg   but it didn't helped much.). The problem occurs every urination. The problem has been gradually worsening. The quality of the pain is described as burning. There has been no fever. There is no history of pyelonephritis. Associated symptoms include frequency and urgency. Pertinent negatives include no discharge, flank pain, hematuria, nausea or vomiting. Associated symptoms comments: Abdominal Pain-LLQ. She has tried antibiotics and increased fluids for the symptoms. Her past medical history is significant for kidney stones and recurrent UTIs. h/o endometrial cancer; s/p hysterctomy      Medications: Outpatient Medications Prior to Visit  Medication Sig   betamethasone dipropionate (DIPROLENE) 0.05 % cream Apply 1 application topically 2 (two) times daily as needed (for psoriasis on body).   desonide (DESOWEN) 0.05 % cream Apply 1 application topically 2 (two) times daily as needed (for psoriasis on face).   lidocaine-prilocaine (EMLA) cream Apply to affected area once   Multiple Vitamins-Minerals (CENTRUM SILVER ADULT 50+ PO) Take 1 tablet by mouth daily.   naproxen sodium (ALEVE) 220 MG tablet Take 220 mg by mouth daily as needed.   ibuprofen (ADVIL) 600 MG tablet Take 1 tablet (600 mg total) by mouth every 8 (eight) hours as needed for moderate pain.   Facility-Administered Medications Prior to Visit  Medication Dose Route Frequency Provider   sodium chloride flush (NS) 0.9 % injection 10 mL  10 mL Intravenous PRN Earlie Server, MD    Review of Systems    Gastrointestinal: Negative for abdominal pain, nausea and vomiting.  Genitourinary: Positive for dysuria, frequency and urgency. Negative for difficulty urinating, dyspareunia, enuresis, flank pain and hematuria.       Objective    BP 127/76 (BP Location: Left Arm, Patient Position: Sitting, Cuff Size: Normal)    Pulse 67    Temp 98.3 F (36.8 C) (Oral)    Resp 16    Wt 186 lb 6.4 oz (84.6 kg)    BMI 29.19 kg/m     Physical Exam Constitutional:      General: She is not in acute distress.    Appearance: Normal appearance. She is well-developed, well-groomed and overweight. She is not diaphoretic.  Cardiovascular:     Rate and Rhythm: Normal rate and regular rhythm.     Heart sounds: Normal heart sounds. No murmur heard.  No friction rub. No gallop.   Pulmonary:     Effort: Pulmonary effort is normal. No respiratory distress.     Breath sounds: Normal breath sounds. No wheezing or rales.  Abdominal:     General: Bowel sounds are normal. There is no distension.     Palpations: Abdomen is soft. There is no mass.     Tenderness: There is no abdominal tenderness. There is no guarding or rebound.  Skin:    General: Skin is warm and dry.  Neurological:     Mental Status: She is alert and oriented to person, place, and time.  Psychiatric:        Behavior: Behavior is cooperative.      Results  for orders placed or performed in visit on 06/12/20  POCT urinalysis dipstick  Result Value Ref Range   Color, UA Yellow    Clarity, UA Clear    Glucose, UA Negative Negative   Bilirubin, UA Negative    Ketones, UA Negative    Spec Grav, UA 1.015 1.010 - 1.025   Blood, UA Negative    pH, UA 6.0 5.0 - 8.0   Protein, UA Negative Negative   Urobilinogen, UA 0.2 0.2 or 1.0 E.U./dL   Nitrite, UA Negative    Leukocytes, UA Trace (A) Negative   Appearance     Odor Positive     Assessment & Plan     1. Possible urinary tract infection UA has trace leuks. No systemic symptoms. Will  treat empirically with Bactrim as below. Continue to push fluids. Urine sent for culture. Will follow up pending C&S results. She is to call if symptoms do not improve or if they worsen.  - Urine Culture - sulfamethoxazole-trimethoprim (BACTRIM DS) 800-160 MG tablet; Take 1 tablet by mouth 2 (two) times daily.  Dispense: 10 tablet; Refill: 0   Return if symptoms worsen or fail to improve.      Reynolds Bowl, PA-C, have reviewed all documentation for this visit. The documentation on 06/12/20 for the exam, diagnosis, procedures, and orders are all accurate and complete.   Rubye Beach  [facility] [phone] (phone) [phone] (fax)  [place]

## 2020-06-12 NOTE — Patient Instructions (Signed)
Urinary Tract Infection, Adult A urinary tract infection (UTI) is an infection of any part of the urinary tract. The urinary tract includes:  The kidneys.  The ureters.  The bladder.  The urethra. These organs make, store, and get rid of pee (urine) in the body. What are the causes? This is caused by germs (bacteria) in your genital area. These germs grow and cause swelling (inflammation) of your urinary tract. What increases the risk? You are more likely to develop this condition if:  You have a small, thin tube (catheter) to drain pee.  You cannot control when you pee or poop (incontinence).  You are female, and: ? You use these methods to prevent pregnancy:  A medicine that kills sperm (spermicide).  A device that blocks sperm (diaphragm). ? You have low levels of a female hormone (estrogen). ? You are pregnant.  You have genes that add to your risk.  You are sexually active.  You take antibiotic medicines.  You have trouble peeing because of: ? A prostate that is bigger than normal, if you are female. ? A blockage in the part of your body that drains pee from the bladder (urethra). ? A kidney stone. ? A nerve condition that affects your bladder (neurogenic bladder). ? Not getting enough to drink. ? Not peeing often enough.  You have other conditions, such as: ? Diabetes. ? A weak disease-fighting system (immune system). ? Sickle cell disease. ? Gout. ? Injury of the spine. What are the signs or symptoms? Symptoms of this condition include:  Needing to pee right away (urgently).  Peeing often.  Peeing small amounts often.  Pain or burning when peeing.  Blood in the pee.  Pee that smells bad or not like normal.  Trouble peeing.  Pee that is cloudy.  Fluid coming from the vagina, if you are female.  Pain in the belly or lower back. Other symptoms include:  Throwing up (vomiting).  No urge to eat.  Feeling mixed up (confused).  Being tired  and grouchy (irritable).  A fever.  Watery poop (diarrhea). How is this treated? This condition may be treated with:  Antibiotic medicine.  Other medicines.  Drinking enough water. Follow these instructions at home:  Medicines  Take over-the-counter and prescription medicines only as told by your doctor.  If you were prescribed an antibiotic medicine, take it as told by your doctor. Do not stop taking it even if you start to feel better. General instructions  Make sure you: ? Pee until your bladder is empty. ? Do not hold pee for a long time. ? Empty your bladder after sex. ? Wipe from front to back after pooping if you are a female. Use each tissue one time when you wipe.  Drink enough fluid to keep your pee pale yellow.  Keep all follow-up visits as told by your doctor. This is important. Contact a doctor if:  You do not get better after 1-2 days.  Your symptoms go away and then come back. Get help right away if:  You have very bad back pain.  You have very bad pain in your lower belly.  You have a fever.  You are sick to your stomach (nauseous).  You are throwing up. Summary  A urinary tract infection (UTI) is an infection of any part of the urinary tract.  This condition is caused by germs in your genital area.  There are many risk factors for a UTI. These include having a small, thin   tube to drain pee and not being able to control when you pee or poop.  Treatment includes antibiotic medicines for germs.  Drink enough fluid to keep your pee pale yellow. This information is not intended to replace advice given to you by your health care provider. Make sure you discuss any questions you have with your health care provider. Document Revised: 10/11/2018 Document Reviewed: 05/03/2018 Elsevier Patient Education  2020 [employer]  

## 2020-06-17 ENCOUNTER — Ambulatory Visit: Payer: Medicare Other

## 2020-06-19 ENCOUNTER — Encounter: Payer: Self-pay | Admitting: Physician Assistant

## 2020-06-19 ENCOUNTER — Ambulatory Visit (INDEPENDENT_AMBULATORY_CARE_PROVIDER_SITE_OTHER): Payer: Medicare Other | Admitting: Physician Assistant

## 2020-06-19 ENCOUNTER — Other Ambulatory Visit: Payer: Self-pay

## 2020-06-19 VITALS — BP 121/77 | HR 80 | Temp 97.9°F | Wt 185.8 lb

## 2020-06-19 DIAGNOSIS — R3989 Other symptoms and signs involving the genitourinary system: Secondary | ICD-10-CM

## 2020-06-19 LAB — POCT URINALYSIS DIPSTICK
Bilirubin, UA: NEGATIVE
Glucose, UA: NEGATIVE
Ketones, UA: NEGATIVE
Leukocytes, UA: NEGATIVE
Nitrite, UA: NEGATIVE
Protein, UA: NEGATIVE
Spec Grav, UA: 1.025 (ref 1.010–1.025)
Urobilinogen, UA: 0.2 E.U./dL
pH, UA: 6 (ref 5.0–8.0)

## 2020-06-19 MED ORDER — CIPROFLOXACIN HCL 500 MG PO TABS: 500.0000 mg | ORAL_TABLET | Freq: Two times a day (BID) | ORAL | 0 refills | Status: DC

## 2020-06-19 NOTE — Progress Notes (Signed)
Established patient visit   Patient: Katherine Snyder   DOB: 1950-11-15   69 y.o. Female  MRN: [id number] Visit Date: 06/19/2020  Today's healthcare provider: Mar Daring, PA-C   Chief Complaint  Patient presents with  . Urinary Tract Infection   Mertie Moores as a scribe for [employer], PA-C.,have documented all relevant documentation on the behalf of Mar Daring, PA-C,as directed by  Mar Daring, PA-C while in the presence of Mar Daring, PA-C.  Subjective    Urinary Tract Infection  This is a recurrent problem. The current episode started 1 to 4 weeks ago. The problem occurs every urination. The problem has been unchanged. The quality of the pain is described as burning and aching. Associated symptoms include frequency and urgency. Pertinent negatives include no discharge, flank pain or hematuria. She has tried antibiotics for the symptoms. The treatment provided no relief. Her past medical history is significant for kidney stones and recurrent UTIs.    Recently seen on 06/12/20 and urine culture was positive for Pseudomonas aeruginosa and was sensitive to treatment. However patient continues to have symptoms and pain. She does have history of uterine cancer with radiation. She is scheduled to see oncology next week.   Patient Active Problem List   Diagnosis Date Noted  . Neuropathy 05/16/2018  . Fatigue 05/16/2018  . Inflammatory heel pain, right 05/16/2018  . Encounter for antineoplastic chemotherapy 05/16/2018  . Drug-induced neutropenia ([place]) 05/09/2018  . Goals of care, counseling/discussion 04/18/2018  . Endometrial cancer ([place])   . Seborrhea 05/13/2016  . History of kidney stones 06/26/2015  . OP (osteoporosis) 06/26/2015  . Plantar fasciitis 06/26/2015  . Current tobacco use 06/26/2015  . Kidney stone 06/21/2013  . Chronic airway obstruction ([place]) 10/08/2009  . Psoriasis 09/30/2008  . 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 Menopausal symptom 09/21/2007  . Tobacco use 09/21/2007  . Awareness of heartbeats 09/21/2007   Past Medical History:  Diagnosis Date  . Arthritis   . Cancer [facility])    endometrial  . Endometrial mass   . History of chicken pox   . History of kidney stones   . History of measles   . History of mumps   . Osteoporosis   . Personal history of chemotherapy   . Personal history of radiation therapy   . PMB (postmenopausal bleeding)   . Psoriasis        Medications: Outpatient Medications Prior to Visit  Medication Sig  . betamethasone dipropionate (DIPROLENE) 0.05 % cream Apply 1 application topically 2 (two) times daily as needed (for psoriasis on body).  Marland Kitchen desonide (DESOWEN) 0.05 % cream Apply 1 application topically 2 (two) times daily as needed (for psoriasis on face).  Marland Kitchen lidocaine-prilocaine (EMLA) cream Apply to affected area once  . Multiple Vitamins-Minerals (CENTRUM SILVER ADULT 50+ PO) Take 1 tablet by mouth daily.  . naproxen sodium (ALEVE) 220 MG tablet Take 220 mg by mouth daily as needed.  . [DISCONTINUED] sulfamethoxazole-trimethoprim (BACTRIM DS) 800-160 MG tablet Take 1 tablet by mouth 2 (two) times daily.   Facility-Administered Medications Prior to Visit  Medication Dose Route Frequency Provider  . sodium chloride flush (NS) 0.9 % injection 10 mL  10 mL Intravenous PRN Earlie Server, MD    Review of Systems  Constitutional: Negative.   Respiratory: Negative.   Cardiovascular: Negative.   Genitourinary: Positive for frequency, pelvic pain and urgency. Negative for flank pain and hematuria.  Musculoskeletal:  Negative.     Last CBC Lab Results  Component Value Date   WBC 7.2 04/13/2020   HGB 12.8 04/13/2020   HCT 38.4 04/13/2020   MCV 88.9 04/13/2020   MCH 29.6 04/13/2020   RDW 12.9 04/13/2020   PLT 226 09/73/5329   Last metabolic panel Lab Results  Component Value Date   GLUCOSE 99 04/13/2020   NA 139  04/13/2020   K 4.4 04/13/2020   CL 103 04/13/2020   CO2 26 04/13/2020   BUN 20 04/13/2020   CREATININE 0.82 04/13/2020   GFRNONAA >60 04/13/2020   GFRAA >60 04/13/2020   CALCIUM 9.2 04/13/2020   PROT 7.5 04/13/2020   ALBUMIN 4.0 04/13/2020   LABGLOB 2.5 02/25/2020   AGRATIO 1.8 02/25/2020   BILITOT 0.7 04/13/2020   ALKPHOS 67 04/13/2020   AST 20 04/13/2020   ALT 15 04/13/2020   ANIONGAP 10 04/13/2020      Objective    BP 121/77 (BP Location: Left Arm, Patient Position: Sitting, Cuff Size: Normal)   Pulse 80   Temp 97.9 F (36.6 C) (Oral)   Wt 185 lb 12.8 oz (84.3 kg)   BMI 29.10 kg/m  BP Readings from Last 3 Encounters:  06/24/20 138/80  06/19/20 121/77  06/12/20 127/76   Wt Readings from Last 3 Encounters:  06/24/20 189 lb 12.8 oz (86.1 kg)  06/19/20 185 lb 12.8 oz (84.3 kg)  06/12/20 186 lb 6.4 oz (84.6 kg)      Physical Exam Constitutional:      General: She is not in acute distress.    Appearance: Normal appearance. She is well-developed. She is not diaphoretic.  Cardiovascular:     Rate and Rhythm: Normal rate and regular rhythm.     Heart sounds: Normal heart sounds. No murmur heard.  No friction rub. No gallop.   Pulmonary:     Effort: Pulmonary effort is normal. No respiratory distress.     Breath sounds: Normal breath sounds. No wheezing or rales.  Abdominal:     General: Bowel sounds are normal. There is no distension.     Palpations: Abdomen is soft. There is no mass.     Tenderness: There is abdominal tenderness in the suprapubic area. There is no right CVA tenderness, left CVA tenderness, guarding or rebound.  Skin:    General: Skin is warm and dry.  Neurological:     Mental Status: She is alert and oriented to person, place, and time.      Results for orders placed or performed in visit on 06/19/20  POCT Urinalysis Dipstick  Result Value Ref Range   Color, UA yellow    Clarity, UA clear    Glucose, UA Negative Negative   Bilirubin, UA  negative    Ketones, UA negative    Spec Grav, UA 1.025 1.010 - 1.025   Blood, UA nonhemolyzed trace    pH, UA 6.0 5.0 - 8.0   Protein, UA Negative Negative   Urobilinogen, UA 0.2 0.2 or 1.0 E.U./dL   Nitrite, UA negative    Leukocytes, UA Negative Negative   Appearance     Odor      Assessment & Plan     1. Possible urinary tract infection Symptoms persistent despite treatment with Bactrim on 06/12/20. Still no systemic symptoms. Will change therapy to Cipro as below. Patient declined vaginal exam due to upcoming appt with oncology for f/u of her endometrial cancer. F/U if not improving.  - ciprofloxacin (CIPRO) 500 MG  tablet; Take 1 tablet (500 mg total) by mouth 2 (two) times daily. (Patient not taking: Reported on 06/24/2020)  Dispense: 14 tablet; Refill: 0  Return if symptoms worsen or fail to improve.      Reynolds Bowl, PA-C, have reviewed all documentation for this visit. The documentation on 06/30/20 for the exam, diagnosis, procedures, and orders are all accurate and complete.   Rubye Beach  [facility] [phone] (phone) [phone] (fax)  [place]

## 2020-06-19 NOTE — Patient Instructions (Signed)
Phenazopyridine OTC for bladder spasm

## 2020-06-20 LAB — URINE CULTURE

## 2020-06-24 ENCOUNTER — Other Ambulatory Visit: Payer: Self-pay | Admitting: Nurse Practitioner

## 2020-06-24 ENCOUNTER — Inpatient Hospital Stay: Payer: Medicare Other | Attending: Obstetrics and Gynecology

## 2020-06-24 ENCOUNTER — Other Ambulatory Visit: Payer: Self-pay

## 2020-06-24 ENCOUNTER — Inpatient Hospital Stay (HOSPITAL_BASED_OUTPATIENT_CLINIC_OR_DEPARTMENT_OTHER): Payer: Medicare Other | Admitting: Nurse Practitioner

## 2020-06-24 VITALS — BP 138/80 | HR 76 | Temp 97.8°F | Resp 20 | Wt 189.8 lb

## 2020-06-24 DIAGNOSIS — Z90710 Acquired absence of both cervix and uterus: Secondary | ICD-10-CM | POA: Diagnosis not present

## 2020-06-24 DIAGNOSIS — Z87440 Personal history of urinary (tract) infections: Secondary | ICD-10-CM | POA: Insufficient documentation

## 2020-06-24 DIAGNOSIS — Z90722 Acquired absence of ovaries, bilateral: Secondary | ICD-10-CM | POA: Diagnosis not present

## 2020-06-24 DIAGNOSIS — C55 Malignant neoplasm of uterus, part unspecified: Secondary | ICD-10-CM

## 2020-06-24 DIAGNOSIS — R102 Pelvic and perineal pain: Secondary | ICD-10-CM | POA: Diagnosis not present

## 2020-06-24 DIAGNOSIS — R61 Generalized hyperhidrosis: Secondary | ICD-10-CM | POA: Insufficient documentation

## 2020-06-24 DIAGNOSIS — R350 Frequency of micturition: Secondary | ICD-10-CM

## 2020-06-24 DIAGNOSIS — C541 Malignant neoplasm of endometrium: Secondary | ICD-10-CM | POA: Insufficient documentation

## 2020-06-24 DIAGNOSIS — Z923 Personal history of irradiation: Secondary | ICD-10-CM | POA: Insufficient documentation

## 2020-06-24 DIAGNOSIS — R300 Dysuria: Secondary | ICD-10-CM | POA: Diagnosis not present

## 2020-06-24 DIAGNOSIS — M7989 Other specified soft tissue disorders: Secondary | ICD-10-CM | POA: Insufficient documentation

## 2020-06-24 DIAGNOSIS — Z9221 Personal history of antineoplastic chemotherapy: Secondary | ICD-10-CM | POA: Insufficient documentation

## 2020-06-24 DIAGNOSIS — Z95828 Presence of other vascular implants and grafts: Secondary | ICD-10-CM

## 2020-06-24 LAB — URINALYSIS, COMPLETE (UACMP) WITH MICROSCOPIC
Bilirubin Urine: NEGATIVE
Glucose, UA: NEGATIVE mg/dL
Hgb urine dipstick: NEGATIVE
Ketones, ur: NEGATIVE mg/dL
Leukocytes,Ua: NEGATIVE
Nitrite: NEGATIVE
Protein, ur: NEGATIVE mg/dL
Specific Gravity, Urine: 1.017 (ref 1.005–1.030)
Squamous Epithelial / HPF: NONE SEEN (ref 0–5)
pH: 6 (ref 5.0–8.0)

## 2020-06-24 MED ORDER — HEPARIN SOD (PORK) LOCK FLUSH 100 UNIT/ML IV SOLN
500.0000 [IU] | Freq: Once | INTRAVENOUS | Status: AC
  Administered 2020-06-24: 500 [IU] via INTRAVENOUS
  Filled 2020-06-24: qty 5

## 2020-06-24 MED ORDER — TRAMADOL HCL 50 MG PO TABS: 50.0000 mg | ORAL_TABLET | Freq: Four times a day (QID) | ORAL | 0 refills | Status: DC | PRN

## 2020-06-24 MED ORDER — SODIUM CHLORIDE 0.9% FLUSH
10.0000 mL | Freq: Once | INTRAVENOUS | Status: AC
  Administered 2020-06-24: 10 mL via INTRAVENOUS
  Filled 2020-06-24: qty 10

## 2020-06-24 NOTE — Progress Notes (Signed)
UA negative except for rare bacteria. Culture pending. Recommend CT scan to evaluate symptoms in setting of her history of malignancy. Given lung nodules will check chest, abdomen, pelvis. Follow up based on results.

## 2020-06-24 NOTE — Progress Notes (Cosign Needed)
August 8,2021 patient stated she went [facility] for possibe UTI August 13,2021 went to primary care for possible UTI 3 weeks with pain in vaginal and pelvic area.

## 2020-06-24 NOTE — Progress Notes (Signed)
Gynecologic Oncology Interval Visit   Referring Provider: Dr. Amalia Hailey  Chief Complaint: Endometrial Cancer  Subjective:  Katherine Snyder is a 70 y.o. 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  She received her 6th cycle of carbo-taxol on 08/08/2018.  She received vaginal brachytherapy with Dr. Baruch Gouty 10/19-11/09/2018.   In the interim she had thyroid ultrasound for incidental thyroid nodule seen on surveillance imaging for indeterminate pulmonary nodules on 5/20. Biopsy was benign.   Colonoscopy on 03/13/20. Mammogram 03/04/20 was negative.   Today, she reports urinary pain and frequency.  Rates pain 9 of 10.  Unrelieved by Tylenol and/or ibuprofen.  She has been on antibiotics for urinary tract infections several times recently without improvement in symptoms.  No bleeding or discharge.  Has chronic leg swelling which is no worse.  No abdominal pain, early satiety, or changes in bowel movements.  Has chronic night sweats for decades which are unchanged.  No shortness of breath or chest pain.   GYNECOLOGIC ONCOLOGY HISTORY:  Initially, patient presented to ER on 02/26/18 for PMB. She was reportedly 14 years post menopausal and had had a 2 day episode of vaginal bleeding that was constant.   02/26/18- Ultrasound Pelvic Complete w/ Transvaginal: Endometrium thickness up to 70mm. No focal solid or cystic change. IMPRESSION: Mildly enlarged uterus. No discrete fibroids. Markedly thickened endometrium. In the setting of post-menopausal bleeding, endometrial sampling is indicated to exclude carcinoma. If results are benign, sonohysterogram should be considered for focal lesion work-up. (Ref: Radiological Reasoning: Algorithmic Workup of Abnormal Vaginal Bleeding with Endovaginal Sonography and Sonohysterography. AJR 2008; 630:Z60-10)  Nonvisualization of the right ovary. Normal-appearing left ovary. No significant uterine fibroids were observed.  She was seen by Dr. Ellard Artis son 03/01/18 who performed vacurette endometrial biopsy. Uterus was sounded to length to 9 cm. She was started on Aygestin and bleeding stopped.   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  COMMENT: BASED ON INITIAL HISTOLOGIC FINDINGS, IMMUNOHISTOCHEMISTRY IS EVALUATED:  THE SARCOMATOUS TUMOR COMPONENT IS DIFFUSELY POSITIVE FOR CD10 AND THE CARCINOMA COMPONENT IS POSITIVE FOR PANKERATIN, SUPPORTING THE DIAGNOSIS.   She was seen by Dr. Fransisca Connors, Gyn-Onc on 03/28/2018 for initial evaluation and treatment planning.  TLH BSO with sentinel lymph node mapping and biopsies and aortic lymph node dissection were discussed.  [facility]gery at [facility] was recommended but unfortunately, patient's insurance will not cover.   CA 125- 10.5  03/30/18 - CT C/A/P W CONTRAST IMPRESSION: 1. Mass like expansion of the endometrial cavity known primary endometrial carcinoma. 2. No evidence for nodal metastasis or solid organ metastasis within the abdomen or pelvis. 3. Scattered nonspecific solid nodules and a single part solid nodule identified in both lungs. Consensus criteria recommendations for nodule followup does not apply to patients with known primary malignancy. 4. Liver cysts.  She underwent CT Imaging and then robot assisted total hysterectomy with sentinel LN mapping, biopsies, and aortic LN dissection with Dr. Theora Gianotti at [facility] 04/04/18.   A. UTERUS WITH CERVIX; HYSTERECTOMY:  - CARCINOSARCOMA.  - MYOMETRIAL INVASION PRESENT, LESS THAN HALF OF THE MYOMETRIUM.  - NEGATIVE FOR CERVICAL AND SEROSAL INVOLVEMENT.  - SUBMUCOSAL LEIOMYOMA.   FALLOPIAN TUBE AND OVARY, LEFT; SALPINGO-OOPHORECTOMY:  - NEGATIVE FOR MALIGNANCY.  - OVARIAN CORTICAL INCLUSION CYSTS.   - ATROPHIC FALLOPIAN TUBE.  B. SENTINEL LYMPH NODE, RIGHT PRIMARY OBTURATOR; EXCISION:  - NEGATIVE FOR MALIGNANCY, ONE LYMPH NODE (0/1).   C. LYMPH NODE, RIGHT EXTERNAL ILIAC VEIN; EXCISION:  - NEGATIVE FOR MALIGNANCY, ONE LYMPH NODE (0/1).   D. FALLOPIAN TUBE AND OVARY, UNILATERAL (RIGHT); SALPINGO-OOPHORECTOMY:  - NEGATIVE FOR MALIGNANCY.  - OVARIAN CORTICAL INCLUSION CYSTS.  - ATROPHIC FALLOPIAN TUBE.   E. SENTINEL LYMPH NODE, RIGHT LOW PARA-AORTIC; EXCISION:  - NEGATIVE FOR MALIGNANCY, TWO LYMPH NODES (0/2).   F. SENTINEL LYMPH NODE, LEFT PRIMARY LOW PARA-AORTIC; EXCISION:  - NEGATIVE FOR MALIGNANCY, ONE LYMPH NODE (0/1).   DIAGNOSIS:  A. PELVIC WASHINGS:  - NEGATIVE FOR MALIGNANCY.    Her case was discussed at [facility] and discussed that recurrence risk without adjuvant treatment is up to 50%.  So recommend adjuvant chemo and radiation.  She was seen by Dr. Baruch Gouty, for initial evaluation of stage IA uterine carcinosarcoma and plan is for brachytherapy after 6 cycles of carbo/taxol.    She initiated carbo-Taxol on 04/25/2018.  Completed 6 cycles on 08/08/2018.  07/11/18- CT Chest WO Contrast for follow-up on pulmonary nodules IMPRESSION: 1. Previously visualized pulmonary nodules are either stable or resolved. Several new subcentimeter bilateral pulmonary nodules. Pulmonary nodularity generally appears centrilobular in distribution and while considered indeterminate is more suggestive of infectious/inflammatory nodularity. Continued chest CT surveillance is recommended in 3-6 months. 2. No thoracic adenopathy.  12/10/2018- CT Chest wo contrast - New sub solid density measuring 12 mm with associated 3 mm solid nodule is noted in left upper lobe. There also noted several other new nodules noted more posteriorly in the left upper lobe, with the largest measuring 3 mm. These may simply be inflammatory in etiology, but neoplasm can not be excluded. Initial follow-up by chest CT  without contrast is recommended in 3 months to confirm persistence. - Stable sub solid density is noted in right upper lobe with grossly stable 4 mm solid nodule. Multiple other pulmonary nodules are noted in both lungs which are stable compared to prior exam - Small nonobstructive right renal calculus - Stable left thyroid nodule - Stable hepatic cyst  She stopped smoking 2/20, but occasionally using vaping products.    CT scan chest 5/20 IMPRESSION: 1. Stable scattered pulmonary nodules as detailed above including a 1.2 cm part solid nodule in the right upper lobe. The solid component remains stable at approximately 0.4 cm. Follow-up non-contrast CT recommended at 3-6 months to confirm persistence. If unchanged, and solid component remains <6 mm, annual CT is recommended until 5 years of stability has been established. If persistent these nodules should be considered highly suspicious if the solid component of the nodule is 6 mm or greater in size and enlarging. This recommendation follows the consensus statement: Guidelines for Management of Incidental Pulmonary Nodules Detected on CT Images: From the Fleischner Society 2017; Radiology 2017; 284:228-243. 2. Additional scattered pulmonary nodules as detailed above measuring up to approximately 8 mm. These are stable from prior study. Attention on yearly follow-up examinations is recommended. 3. Bilateral nonobstructing renal nephroliths are again noted.  4. Mild bilateral interlobular septal thickening consistent with mild volume overload.   Problem List: Patient Active Problem List   Diagnosis Date Noted  . Neuropathy 05/16/2018  . Fatigue 05/16/2018  . Inflammatory heel pain, right 05/16/2018  . Encounter for antineoplastic chemotherapy 05/16/2018  . Drug-induced neutropenia ([place]) 05/09/2018  . Goals of care, counseling/discussion 04/18/2018  . Endometrial cancer ([place])   . Seborrhea 05/13/2016  . History  of kidney stones 06/26/2015  .  OP (osteoporosis) 06/26/2015  . Plantar fasciitis 06/26/2015  . Current tobacco use 06/26/2015  . Kidney stone 06/21/2013  . Chronic airway obstruction ([place]) 10/08/2009  . Psoriasis 09/30/2008  . 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 Menopausal symptom 09/21/2007  . Tobacco use 09/21/2007  . Awareness of heartbeats 09/21/2007    Past Medical History: Past Medical History:  Diagnosis Date  . Arthritis   . Cancer [facility])    endometrial  . Endometrial mass   . History of chicken pox   . History of kidney stones   . History of measles   . History of mumps   . Osteoporosis   . Personal history of chemotherapy   . Personal history of radiation therapy   . PMB (postmenopausal bleeding)   . Psoriasis     Past Surgical History: Past Surgical History:  Procedure Laterality Date  . APPENDECTOMY  1960  . BREAST EXCISIONAL BIOPSY Left 1980   neg  . BREAST SURGERY Left    biopsy  . COLONOSCOPY WITH PROPOFOL N/A 03/13/2020   Procedure: COLONOSCOPY WITH PROPOFOL;  Surgeon: Jonathon Bellows, MD;  Location: [facility] ENDOSCOPY;  Service: Gastroenterology;  Laterality: N/A;  . Injection varicose veins in legs Bilateral 2010   Dr. Hulda Humphrey  . LITHOTRIPSY  1994, 2004   for renal stones  . PORTA CATH INSERTION N/A 04/24/2018   Procedure: PORTA CATH INSERTION;  Surgeon: Katha Cabal, MD;  Location: [facility] CV LAB;  Service: Cardiovascular;  Laterality: N/A;  . ROBOTIC ASSISTED TOTAL HYSTERECTOMY Bilateral 04/04/2018   Procedure: ROBOTIC ASSISTED TOTAL HYSTERECTOMY,SENTINEL LYMPH NODE MAPPING AND BIOPSIES,AORTIC LYMPH NODE DISSECTION;  Surgeon: Gillis Ends, MD;  Location: [facility] ORS;  Service: Gynecology;  Laterality: Bilateral;  . TUBAL LIGATION  1980    OB History:  OB History  Gravida Para Term Preterm AB Living  1 1 1     1   SAB TAB Ectopic Multiple Live Births          1    # Outcome Date GA Lbr Len/2nd Weight Sex Delivery Anes PTL Lv  1 Term 64    M  Vag-Spont   LIV  G1P1001 LMP:   Family History: Family History  Problem Relation Age of Onset  . Breast cancer Mother   . Transient ischemic attack Mother   . Arthritis Mother   . Hypothyroidism Mother   . Lung cancer Mother   . Diabetes Father        type 2  . Hypertension Father   . Arthritis Father   . Colon cancer Sister   . Prostate cancer Brother   . Hyperlipidemia Son   . Stomach cancer Maternal Grandmother   . Stroke Maternal Grandfather     Social History: Social History   Socioeconomic History  . Marital status: Single    Spouse name: Not on file  . Number of children: 1  . Years of education: Not on file  . Highest education level: Not on file  Occupational History  . Occupation: Employed    Comment: Works at a [employer] doing [employer]  Tobacco Use  . Smoking status: Former Smoker    Packs/day: 0.25    Types: Cigarettes    Quit date: 12/08/2018    Years since quitting: 1.5  . Smokeless tobacco: Never Used  Vaping Use  . Vaping Use: Some days  Substance and Sexual Activity  . Alcohol use: No  Alcohol/week: 0.0 standard drinks  . Drug use: No  . Sexual activity: Not Currently  Other Topics Concern  . Not on file  Social History Narrative  . Not on file   Social Determinants of Health   Financial Resource Strain:   . Difficulty of Paying Living Expenses:   Food Insecurity:   . Worried About Charity fundraiser in the Last Year:   . Arboriculturist in the Last Year:   Transportation Needs:   . Film/video editor (Medical):   Marland Kitchen Lack of Transportation (Non-Medical):   Physical Activity:   . Days of Exercise per Week:   . Minutes of Exercise per Session:   Stress:   . Feeling of Stress :   Social Connections:   . Frequency of Communication with Friends and Family:   . Frequency of Social Gatherings with Friends and Family:   . Attends Religious Services:   . Active Member of Clubs or Organizations:   . Attends Archivist  Meetings:   Marland Kitchen Marital Status:   Intimate Partner Violence:   . Fear of Current or Ex-Partner:   . Emotionally Abused:   Marland Kitchen Physically Abused:   . Sexually Abused:     Allergies: No Known Allergies  Current Medications: Current Outpatient Medications  Medication Sig Dispense Refill  . betamethasone dipropionate (DIPROLENE) 0.05 % cream Apply 1 application topically 2 (two) times daily as needed (for psoriasis on body). 30 g 1  . ciprofloxacin (CIPRO) 500 MG tablet Take 1 tablet (500 mg total) by mouth 2 (two) times daily. 14 tablet 0  . desonide (DESOWEN) 0.05 % cream Apply 1 application topically 2 (two) times daily as needed (for psoriasis on face). 30 g 1  . lidocaine-prilocaine (EMLA) cream Apply to affected area once 30 g 3  . Multiple Vitamins-Minerals (CENTRUM SILVER ADULT 50+ PO) Take 1 tablet by mouth daily.    . naproxen sodium (ALEVE) 220 MG tablet Take 220 mg by mouth daily as needed.     No current facility-administered medications for this visit.   Facility-Administered Medications Ordered in Other Visits  Medication Dose Route Frequency Provider Last Rate Last Admin  . sodium chloride flush (NS) 0.9 % injection 10 mL  10 mL Intravenous PRN Earlie Server, MD   10 mL at 05/06/20 1305   Review of Systems General:  Night sweats - chronic & unchanged Skin: no complaints Eyes: no complaints HEENT: no complaints Breasts: no complaints Pulmonary: no complaints Cardiac: leg swelling bilaterally - chronic and unchanged.  Gastrointestinal: no complaints Genitourinary/Sexual: dysuria per hpi Ob/Gyn: no complaints Musculoskeletal: no complaints Hematology: no complaints Neurologic/Psych: no complaints   Objective:  Physical Examination:  Vitals:   06/24/20 1003  BP: 138/80  Pulse: 76  Resp: 20  Temp: 97.8 F (36.6 C)  SpO2: 98%   Pain 9/10- pelvic-lower   ECOG Performance Status: 1 - Symptomatic but completely ambulatory  GENERAL: Patient is a well appearing  female in no acute distress HEENT:  Sclera clear. Anicteric. Negative thyroid exam.  NODES:  Negative axillary, supraclavicular, inguinal lymph node survery LUNGS:  Clear to auscultation bilaterally.   HEART:  Regular rate and rhythm.  ABDOMEN:  Soft, nontender.  No hernias, incisions well healed. No masses or ascites EXTREMITIES:  No peripheral edema. Atraumatic. No cyanosis SKIN:  Clear with no obvious rashes or skin changes.  NEURO:  Nonfocal. Well oriented.  Appropriate affect.  PELVIC: Exam chaperoned by cma. EGBUS: normal, Vagina: atropic  with no lesions. Not painful. No bleeding or discharge. No lesions. Bimanual/RV: normal, no masses.    Assessment:  Katherine Snyder is a 69 y.o. female diagnosed with stage IA uterine carcinosarcoma with RA TLH/BSO, negative staging 5/19 followed by vaginal brachytherapy and 6 cycles of carbo/taxol chemotherapy completed 08/08/2018.  NED on exam today.  Pelvic pain and urinary symptoms refractory to antibiotics.   Chest CTs have  shown indeterminate lung nodules, some disapearing over time and some new thought to be benign.  She was a smoker, but stopped in 2/20.    Comorbidities complicating care: smoker, plantar fasciitis  Plan:   Problem List Items Addressed This Visit    None    Visit Diagnoses    Uterine carcinosarcoma ([place])    -  Primary     Will check UA with microscopy today. If negative, recommend CT chest-abdomen-pelvis to further evaluate etiology. Will write for tramadol in interim for pain. Discussed constipation risks and bowel prophylaxis.   Otherwise reviewed NCCN guidelines for surveillance including physical exam every 3-6 months for 2- 3 years then every 6 months for years 4-5 then annually thereafter. She will be 2 years post completion of treatment in October. We will alternate with Dr. Tasia Catchings who will see her in 3 months and we will see her back in 6 months.   Could consider removal of port. Will defer to Dr. Tasia Catchings. She will continue  port flushes every 8-12 weeks during covid 19 pandemic.   I encouraged covid vaccination.   Beckey Rutter, DNP, AGNP-C [place] at [facility] [phone] (clinic)  CC:  [place], [place] [address] [place],  [place] [address] [phone]   Dr. Tasia Catchings & Dr. Fransisca Connors

## 2020-06-25 LAB — URINE CULTURE: Culture: NO GROWTH

## 2020-07-02 ENCOUNTER — Other Ambulatory Visit: Payer: Self-pay

## 2020-07-02 ENCOUNTER — Ambulatory Visit
Admission: RE | Admit: 2020-07-02 | Discharge: 2020-07-02 | Disposition: A | Discharge status: Home / Self Care | Payer: Medicare Other | Source: Ambulatory Visit | Attending: Nurse Practitioner | Admitting: Nurse Practitioner

## 2020-07-02 DIAGNOSIS — N200 Calculus of kidney: Secondary | ICD-10-CM | POA: Diagnosis not present

## 2020-07-02 DIAGNOSIS — J9811 Atelectasis: Secondary | ICD-10-CM | POA: Diagnosis not present

## 2020-07-02 DIAGNOSIS — R918 Other nonspecific abnormal finding of lung field: Secondary | ICD-10-CM | POA: Diagnosis not present

## 2020-07-02 DIAGNOSIS — C55 Malignant neoplasm of uterus, part unspecified: Secondary | ICD-10-CM | POA: Insufficient documentation

## 2020-07-02 DIAGNOSIS — R102 Pelvic and perineal pain: Secondary | ICD-10-CM

## 2020-07-02 DIAGNOSIS — R300 Dysuria: Secondary | ICD-10-CM | POA: Diagnosis not present

## 2020-07-02 DIAGNOSIS — I700 Atherosclerosis of aorta: Secondary | ICD-10-CM | POA: Diagnosis not present

## 2020-07-02 LAB — POCT I-STAT CREATININE: Creatinine, Ser: 0.9 mg/dL (ref 0.44–1.00)

## 2020-07-02 MED ORDER — IOHEXOL 300 MG/ML  SOLN
100.0000 mL | Freq: Once | INTRAMUSCULAR | Status: AC | PRN
  Administered 2020-07-02: 100 mL via INTRAVENOUS

## 2020-07-03 ENCOUNTER — Telehealth: Payer: Self-pay | Admitting: Nurse Practitioner

## 2020-07-03 NOTE — Telephone Encounter (Signed)
Spoke to patient to follow up on her pelvic pain. She says it has resolved and believes she passed a stone. CT showed no evidence of malignancy or adenopathy.  Keep follow-up as scheduled.  If symptoms return notify clinic for earlier visit.

## 2020-08-26 ENCOUNTER — Telehealth: Payer: Self-pay | Admitting: Physician Assistant

## 2020-08-26 NOTE — Telephone Encounter (Signed)
Copied from [place] [id number]. Topic: Medicare AWV >> Aug 26, 2020 12:47 PM Cher Nakai R wrote: Reason for CRM:   No answer unable to leave message for patient to call back and schedule Medicare Annual Wellness Visit (AWV) either virtually or in office.  Last AWV  01/28/2019  Please schedule at anytime with [facility] Health Advisor.  If any questions, please contact me at [phone]

## 2020-09-22 ENCOUNTER — Encounter: Payer: Self-pay | Admitting: Oncology

## 2020-09-22 ENCOUNTER — Inpatient Hospital Stay: Payer: Medicare Other | Attending: Oncology

## 2020-09-22 ENCOUNTER — Inpatient Hospital Stay (HOSPITAL_BASED_OUTPATIENT_CLINIC_OR_DEPARTMENT_OTHER): Payer: Medicare Other | Admitting: Oncology

## 2020-09-22 VITALS — BP 123/76 | HR 74 | Temp 98.1°F | Resp 16 | Wt 196.5 lb

## 2020-09-22 DIAGNOSIS — Z9221 Personal history of antineoplastic chemotherapy: Secondary | ICD-10-CM | POA: Insufficient documentation

## 2020-09-22 DIAGNOSIS — G629 Polyneuropathy, unspecified: Secondary | ICD-10-CM | POA: Diagnosis not present

## 2020-09-22 DIAGNOSIS — Z8542 Personal history of malignant neoplasm of other parts of uterus: Secondary | ICD-10-CM | POA: Insufficient documentation

## 2020-09-22 DIAGNOSIS — R918 Other nonspecific abnormal finding of lung field: Secondary | ICD-10-CM | POA: Diagnosis not present

## 2020-09-22 DIAGNOSIS — Z87891 Personal history of nicotine dependence: Secondary | ICD-10-CM | POA: Diagnosis not present

## 2020-09-22 DIAGNOSIS — Z79899 Other long term (current) drug therapy: Secondary | ICD-10-CM | POA: Insufficient documentation

## 2020-09-22 DIAGNOSIS — Z95828 Presence of other vascular implants and grafts: Secondary | ICD-10-CM

## 2020-09-22 DIAGNOSIS — C541 Malignant neoplasm of endometrium: Secondary | ICD-10-CM

## 2020-09-22 DIAGNOSIS — C55 Malignant neoplasm of uterus, part unspecified: Secondary | ICD-10-CM | POA: Diagnosis not present

## 2020-09-22 LAB — COMPREHENSIVE METABOLIC PANEL
ALT: 18 U/L (ref 0–44)
AST: 21 U/L (ref 15–41)
Albumin: 3.8 g/dL (ref 3.5–5.0)
Alkaline Phosphatase: 64 U/L (ref 38–126)
Anion gap: 10 (ref 5–15)
BUN: 20 mg/dL (ref 8–23)
CO2: 26 mmol/L (ref 22–32)
Calcium: 9 mg/dL (ref 8.9–10.3)
Chloride: 103 mmol/L (ref 98–111)
Creatinine, Ser: 0.92 mg/dL (ref 0.44–1.00)
GFR, Estimated: >60 mL/min (ref >60)
Glucose, Bld: 89 mg/dL (ref 70–99)
Potassium: 4.3 mmol/L (ref 3.5–5.1)
Sodium: 139 mmol/L (ref 135–145)
Total Bilirubin: 0.5 mg/dL (ref 0.3–1.2)
Total Protein: 7.1 g/dL (ref 6.5–8.1)

## 2020-09-22 LAB — CBC WITH DIFFERENTIAL/PLATELET
Abs Immature Granulocytes: 0.02 10*3/uL (ref 0.00–0.07)
Basophils Absolute: 0.1 10*3/uL (ref 0.0–0.1)
Basophils Relative: 1 %
Eosinophils Absolute: 0.1 10*3/uL (ref 0.0–0.5)
Eosinophils Relative: 2 %
HCT: 39.2 % (ref 36.0–46.0)
Hemoglobin: 13.2 g/dL (ref 12.0–15.0)
Immature Granulocytes: 0 %
Lymphocytes Relative: 30 %
Lymphs Abs: 1.9 10*3/uL (ref 0.7–4.0)
MCH: 29.9 pg (ref 26.0–34.0)
MCHC: 33.7 g/dL (ref 30.0–36.0)
MCV: 88.7 fL (ref 80.0–100.0)
Monocytes Absolute: 0.7 10*3/uL (ref 0.1–1.0)
Monocytes Relative: 12 %
Neutro Abs: 3.5 10*3/uL (ref 1.7–7.7)
Neutrophils Relative %: 55 %
Platelets: 233 10*3/uL (ref 150–400)
RBC: 4.42 MIL/uL (ref 3.87–5.11)
RDW: 12.8 % (ref 11.5–15.5)
WBC: 6.3 10*3/uL (ref 4.0–10.5)
nRBC: 0 % (ref 0.0–0.2)

## 2020-09-22 MED ORDER — DULOXETINE HCL 30 MG PO CPEP: 30.0000 mg | ORAL_CAPSULE | Freq: Every day | ORAL | 3 refills | Status: DC

## 2020-09-22 NOTE — Progress Notes (Signed)
Patient has neuropathy in her feet and legs and does take OTC medications with little help.  Severity of symptoms depends on her level of activity.

## 2020-09-22 NOTE — Progress Notes (Signed)
Hematology/Oncology Follow up  note [facility] [phone]) [phone] [phone]   Patient Care Team: Mar Daring, PA-C as PCP - General (Family Medicine) Mellody Drown, MD as Referring Physician (Obstetrics and Gynecology) Gillis Ends, MD as Referring Physician (Obstetrics and Gynecology) Clent Jacks, RN as Oncology Nurse Navigator Noreene Filbert, MD as Referring Physician (Radiation Oncology) Earlie Server, MD as Consulting Physician (Oncology) Schnier, Dolores Lory, MD (Vascular Surgery) REASON FOR VISIT Follow up for treatment of uterus carcinosarcoma  HISTORY OF PRESENTING ILLNESS:  Katherine Snyder is a  69 y.o.  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 Pathology showed  A uterus with cervix hysterectomy -Carcinosarcoma B sentinel lymph node, right primary obturator negative C lymph node right external iliac lymph negative D fallopian tube and ovary, unilateral right negative E sentinel lymph node right low periaortic: Negative F sentinel lymph node left low periaortic negative  CANCER CASE SUMMARY: ENDOMETRIUM  Procedure: Total hysterectomy and bilateral salpingo-oophorectomy  Histologic Type: Carcinosarcoma  Histologic Grade: Not applicable  Myometrial Invasion: Present    Depth of myometrial invasion cannot be determined due to exophytic  tumor growth and effacement of endometrial/myometrial junction    Myometrial thickness: At least 8 mm    Percentage depth of myometrial invasion: Estimated less than 50%  Uterine Serosa Involvement: Not identified  Cervical Stromal Involvement: Not identified  Other Tissue/Organ Involvement: Not identified  Peritoneal/ Ascitic Fluid: Negative for malignancy  Lymphovascular  Invasion: Present  Regional Lymph Nodes: All lymph nodes negative for tumor cells  Number of Lymph Nodes Examined: 5    Total number of pelvic nodes examined: 2    Number of pelvic sentinel nodes examined: 1    Total number of para-aortic nodes examined: 3    Number of para-aortic sentinel nodes examined: 3   03/01/2018 Pathologic Stage Classification (pTNM, AJCC 8th Edition) (Note J): pT1a  pN0/FIGO IA   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   Pertinent oncology history Katherine Snyder is a 69 y.o. female who has above history reviewed by me today presents for assessment prior to Cycle 6 adjuvant chemotherapy management for stage Ia endometrial carcinosarcoma. During cycle 1 treatment, reported feeling burning down and having really bad heartburn.  Taxol was temporarily stopped.  The patient was given Benadryl, Solu-Medrol, Pepcid, symptoms improved and resolved.  Taxol was restarted and patient is able to finish her treatment. Patient reports feeling extremely tired for 2 days after chemotherapy.  She was seen by nurse practitioner Sonia Baller during interval.   She complained burning sensation with urination and had UA and urine culture obtained.  UA was not convincing for UTI urine culture showed less than 10,000 colonies of insignificant growth.  Patient remains afebrile. Patient also reports her chronic fasciitis of foot has got a lot worse since the start of chemotherapy.  Possible neuropathy was discussed and the patient is reluctant to start gabapentin or Cymbalta.  She was given tramadol 100 mg twice daily.  Patient reports symptoms gradually improved.    Patient was found to have neutropenia so she received Neupogen x 2. She reports bone pain started after  Neupogen  shots.   Marland Kitchen#Lung nodules # 12/10/2018- CT Chest wo contrast -New sub solid density measuring 12 mm with associated 3 mm solid nodule is noted in left upper lobe.Stable sub solid density is noted in right upper lobe with grossly stable 4 mm solid nodule. Multiple other pulmonary nodules are noted in both lungs which are stable compared to prior exam Case was discussed on tumor board and recommend surveillance CT in 3 months.   new lung nodules have resolved on 10/14/2019  All other nodules are stable.   # #Left thyroid nodule,    S/p thyroid nodule FNA.- pathology is benign.   INTERVAL HISTORY Katherine Snyder is a 69 y.o. female who has above history reviewed by me presents for assessment prior to Cycle 6 adjuvant chemotherapy for stage Ia endometrial carcinosarcoma.  Problems and complaints are listed below: Patient has no new complaints. She continues to have intermittent lower extremity numbness and tingling.  She previously had tried gabapentin which she does not like and stopped taking due to loss of memory. Otherwise she has been doing well.  Denies any vaginal discharge or spotting.  Denies any pain.   Review of Systems  Constitutional: Negative for chills, fever, malaise/fatigue and weight loss.  HENT: Negative for sore throat.   Eyes: Negative for redness.  Respiratory: Negative for cough, shortness of breath and wheezing.   Cardiovascular: Negative for chest pain, palpitations and leg swelling.  Gastrointestinal: Negative for abdominal pain, blood in stool, nausea and vomiting.  Genitourinary: Negative for dysuria.  Musculoskeletal: Negative for myalgias.       Intermittent bilateral lower extremity toe sensitivity/tingling/pain  Skin: Negative for rash.  Neurological: Negative for dizziness, tingling and tremors.  Endo/Heme/Allergies: Does not bruise/bleed easily.  Psychiatric/Behavioral: Negative for hallucinations.    MEDICAL HISTORY:  Past Medical History:  Diagnosis  Date  . Arthritis   . Cancer [facility])    endometrial  . Endometrial mass   . History of chicken pox   . History of kidney stones   . History of measles   . History of mumps   . Osteoporosis   . Personal history of chemotherapy   . Personal history of radiation therapy   . PMB (postmenopausal bleeding)   . Psoriasis     SURGICAL HISTORY: Past Surgical History:  Procedure Laterality Date  . APPENDECTOMY  1960  . BREAST EXCISIONAL BIOPSY Left 1980   neg  . BREAST SURGERY Left    biopsy  . COLONOSCOPY WITH PROPOFOL N/A 03/13/2020   Procedure: COLONOSCOPY WITH PROPOFOL;  Surgeon: Jonathon Bellows, MD;  Location: [facility] ENDOSCOPY;  Service: Gastroenterology;  Laterality: N/A;  . Injection varicose veins in legs Bilateral 2010   Dr. Hulda Humphrey  . LITHOTRIPSY  1994, 2004   for renal stones  . PORTA CATH INSERTION N/A 04/24/2018   Procedure: PORTA CATH INSERTION;  Surgeon: Katha Cabal, MD;  Location: [facility] CV LAB;  Service: Cardiovascular;  Laterality: N/A;  . ROBOTIC ASSISTED TOTAL HYSTERECTOMY Bilateral 04/04/2018   Procedure: ROBOTIC ASSISTED TOTAL HYSTERECTOMY,SENTINEL LYMPH NODE MAPPING AND BIOPSIES,AORTIC LYMPH NODE DISSECTION;  Surgeon: Gillis Ends, MD;  Location: [facility] ORS;  Service: Gynecology;  Laterality: Bilateral;  . TUBAL LIGATION  1980    SOCIAL HISTORY: Social History   Socioeconomic History  . Marital status: Single    Spouse name: Not on file  . Number of children: 1  . Years of education: Not on file  . Highest education level: Not on file  Occupational History  . Occupation: Employed    Comment: Works at a [employer] doing [employer]  Tobacco Use  . Smoking status: Former Smoker    Packs/day: 0.25    Types: Cigarettes    Quit date: 12/08/2018    Years since quitting: 1.7  . Smokeless tobacco: Never Used  Vaping Use  . Vaping Use: Some days  Substance and Sexual Activity  . Alcohol use: No    Alcohol/week: 0.0 standard drinks  . Drug use: No  .  Sexual activity: Not Currently  Other Topics Concern  . Not on file  Social History Narrative  . Not on file   Social Determinants of Health   Financial Resource Strain:   . Difficulty of Paying Living Expenses: Not on file  Food Insecurity:   . Worried About Charity fundraiser in the Last Year: Not on file  . Ran Out of Food in the Last Year: Not on file  Transportation Needs:   . Lack of Transportation (Medical): Not on file  . Lack of Transportation (Non-Medical): Not on file  Physical Activity:   . Days of Exercise per Week: Not on file  . Minutes of Exercise per Session: Not on file  Stress:   . Feeling of Stress : Not on file  Social Connections:   . Frequency of Communication with Friends and Family: Not on file  . Frequency of Social Gatherings with Friends and Family: Not on file  . Attends Religious Services: Not on file  . Active Member of Clubs or Organizations: Not on file  . Attends Archivist Meetings: Not on file  . Marital Status: Not on file  Intimate Partner Violence:   . Fear of Current or Ex-Partner: Not on file  . Emotionally Abused: Not on file  . Physically Abused: Not on file  . Sexually Abused: Not on file    FAMILY HISTORY: Family History  Problem Relation Age of Onset  . Breast cancer Mother   . Transient ischemic attack Mother   . Arthritis Mother   . Hypothyroidism Mother   . Lung cancer Mother   . Diabetes Father        type 2  . Hypertension Father   . Arthritis Father   . Colon cancer Sister   . Prostate cancer Brother   . Hyperlipidemia Son   . Stomach cancer Maternal Grandmother   . Stroke Maternal Grandfather     ALLERGIES:  has No Known Allergies.  MEDICATIONS:  Current Outpatient Medications  Medication Sig Dispense Refill  . betamethasone dipropionate (DIPROLENE) 0.05 % cream Apply 1 application topically 2 (two) times daily as needed (for psoriasis on body). 30 g 1  . desonide (DESOWEN) 0.05 % cream Apply 1  application topically 2 (two) times daily as needed (for psoriasis on face). 30 g 1  . lidocaine-prilocaine (EMLA) cream Apply to affected area once 30 g 3  . Multiple Vitamins-Minerals (CENTRUM SILVER ADULT 50+ PO) Take 1 tablet by mouth daily.    . naproxen sodium (ALEVE) 220 MG tablet Take 220 mg by mouth daily as needed.    . ciprofloxacin (CIPRO) 500 MG tablet Take 1 tablet (500 mg total) by mouth 2 (two) times daily. (Patient not taking: Reported on 06/24/2020) 14 tablet 0  . DULoxetine (CYMBALTA) 30 MG capsule Take 1 capsule (30 mg total) by mouth daily. 30 capsule 3  . traMADol (ULTRAM) 50 MG tablet Take 1 tablet (50 mg total) by mouth every  6 (six) hours as needed for moderate pain or severe pain. (Patient not taking: Reported on 09/22/2020) 15 tablet 0   No current facility-administered medications for this visit.   Facility-Administered Medications Ordered in Other Visits  Medication Dose Route Frequency Provider Last Rate Last Admin  . sodium chloride flush (NS) 0.9 % injection 10 mL  10 mL Intravenous PRN Earlie Server, MD   10 mL at 05/06/20 1305     PHYSICAL EXAMINATION: ECOG PERFORMANCE STATUS: 1 - Symptomatic but completely ambulatory Vitals:   09/22/20 1010  BP: 123/76  Pulse: 74  Resp: 16  Temp: 98.1 F (36.7 C)  SpO2: 95%   Filed Weights   09/22/20 1010  Weight: 196 lb 8 oz (89.1 kg)    Physical Exam Constitutional:      General: She is not in acute distress. HENT:     Head: Normocephalic and atraumatic.     Right Ear: External ear normal.  Eyes:     General: No scleral icterus.    Conjunctiva/sclera: Conjunctivae normal.     Pupils: Pupils are equal, round, and reactive to light.  Cardiovascular:     Rate and Rhythm: Normal rate and regular rhythm.     Heart sounds: Normal heart sounds.  Pulmonary:     Effort: Pulmonary effort is normal. No respiratory distress.     Breath sounds: Normal breath sounds. No wheezing or rales.  Chest:     Chest wall: No  tenderness.  Abdominal:     General: Bowel sounds are normal. There is no distension.     Palpations: Abdomen is soft. There is no mass.     Tenderness: There is no abdominal tenderness.  Musculoskeletal:        General: No deformity. Normal range of motion.     Cervical back: Normal range of motion and neck supple.  Lymphadenopathy:     Cervical: No cervical adenopathy.  Skin:    General: Skin is warm and dry.     Findings: No erythema or rash.  Neurological:     Mental Status: She is alert and oriented to person, place, and time. Mental status is at baseline.     Cranial Nerves: No cranial nerve deficit.     Coordination: Coordination normal.     Deep Tendon Reflexes: Reflexes normal.  Psychiatric:        Mood and Affect: Mood normal.      LABORATORY DATA:  I have reviewed the data as listed Lab Results  Component Value Date   WBC 6.3 09/22/2020   HGB 13.2 09/22/2020   HCT 39.2 09/22/2020   MCV 88.7 09/22/2020   PLT 233 09/22/2020   Recent Labs    10/16/19 0947 10/16/19 0947 02/25/20 1203 02/25/20 1203 04/13/20 1251 07/02/20 1409 09/22/20 0946  NA 139  --  140  --  139  --  139  K 3.6   < > 4.2  --  4.4  --  4.3  CL 106   < > 102  --  103  --  103  CO2 26   < > 23  --  26  --  26  GLUCOSE 98  --  87  --  99  --  89  BUN 18  --  16  --  20  --  20  CREATININE 0.78   < > 0.85   < > 0.82 0.90 0.92  CALCIUM 8.9   < > 9.3  --  9.2  --  9.0  GFRNONAA >60   < > 72  --  >60  --  >60  GFRAA >60  --  83  --  >60  --   --   PROT 7.2  --  6.9  --  7.5  --  7.1  ALBUMIN 4.0  --  4.4  --  4.0  --  3.8  AST 17   < > 15  --  20  --  21  ALT 15   < > 12  --  15  --  18  ALKPHOS 64   < > 86  --  67  --  64  BILITOT 0.7  --  0.4  --  0.7  --  0.5   < > = values in this interval not displayed.   Iron/TIBC/Ferritin/ %Sat No results found for: IRON, TIBC, FERRITIN, IRONPCTSAT   RADIOGRAPHIC STUDIES: I have personally reviewed the radiological images as listed and agreed  with the findings in the report. CT CHEST ABDOMEN PELVIS W CONTRAST  Result Date: 07/02/2020 CLINICAL DATA:  69 year old female with history of uterine choriocarcinoma presenting with pelvic pain and dysuria. EXAM: CT CHEST, ABDOMEN, AND PELVIS WITH CONTRAST TECHNIQUE: Multidetector CT imaging of the chest, abdomen and pelvis was performed following the standard protocol during bolus administration of intravenous contrast. CONTRAST:  159mL OMNIPAQUE IOHEXOL 300 MG/ML  SOLN COMPARISON:  Chest CT dated 10/14/2019 and CT abdomen pelvis dated 03/30/2018. FINDINGS: CT CHEST FINDINGS Cardiovascular: There is no cardiomegaly or pericardial effusion. Right-sided Port-A-Cath with tip at the cavoatrial junction. The thoracic aorta is unremarkable. The origins of the great vessels of the aortic arch appear patent as visualized. The central pulmonary arteries appear patent. Mediastinum/Nodes: There is no hilar or mediastinal adenopathy. The esophagus is grossly unremarkable as visualized. Stable left thyroid nodule or heterogeneity. No mediastinal fluid collection. Lungs/Pleura: There is a 3 mm nodular density in the right upper lobe with faint surrounding ground-glass density similar to prior CT. There are bilateral upper lobe scattered nodular densities similar to prior CT, likely post inflammatory/infectious in etiology. A 4 mm nodule along the right minor fissure (73/4) appears similar to prior CT. A 4 mm right lower lobe nodule (74/4) similar to prior CT. A 7 mm left lung base subpleural nodule (117/4), a 4 mm subpleural nodule in the left lower lobe (102/4) are similar to prior CT. No focal consolidation, pleural effusion, or pneumothorax. Linear left lung base atelectasis/scarring. The central airways are patent. Musculoskeletal: No acute osseous pathology. A small sclerotic focus over the lateral aspect of the right seventh rib similar to prior CT, likely a bone island. CT ABDOMEN PELVIS FINDINGS No intra-abdominal  free air or free fluid. Hepatobiliary: Multiple hepatic cysts measure up to 3 cm as seen on the prior CT. The liver is otherwise unremarkable. No intrahepatic biliary ductal dilatation. The gallbladder is unremarkable. Pancreas: Unremarkable. No pancreatic ductal dilatation or surrounding inflammatory changes. Spleen: Normal in size without focal abnormality. Adrenals/Urinary Tract: The adrenal glands unremarkable. There is a punctate nonobstructing stone in the interpolar aspect of the right kidney. There is no hydronephrosis on either side. There is symmetric enhancement and excretion of contrast by both kidneys. High attenuating content in the distal left ureter is not well evaluated and may represent excreted contrast, proteinaceous debris, or blood product. Clinical correlation is recommended. The urinary bladder is unremarkable. Stomach/Bowel: There is moderate stool throughout the colon. There is diffuse colonic diverticulosis without active inflammatory changes. There is no  bowel obstruction or active inflammation. Appendectomy. Vascular/Lymphatic: Mild aortoiliac atherosclerotic disease. The IVC is unremarkable. No portal venous gas. There is no adenopathy. Reproductive: Hysterectomy.  No pelvic mass. Other: None Musculoskeletal: Degenerative changes of the spine. No acute osseous pathology. IMPRESSION: 1. No acute intrathoracic, abdominal, or pelvic pathology. 2. No evidence of metastatic disease in the chest, abdomen, or pelvis. 3. Bilateral pulmonary nodules similar to prior CT. 4. Small nonobstructing right renal interpolar calculus. 5. Colonic diverticulosis. No bowel obstruction. 6. Aortic Atherosclerosis (ICD10-I70.0). Electronically Signed   By: Anner Crete M.D.   On: 07/02/2020 21:07     ASSESSMENT & PLAN:  1. Uterine carcinosarcoma ([place])   2. Neuropathy   3. Port-A-Cath in place   Stage IA Uterine carcinosarcoma  Status post 6 cycles adjuvant carboplatinum AUC 5 and Taxol 135  mg/m. Clinically patient is doing very well  Labs are reviewed and discussed with patient. CA1 25 is still pending.   Patient will continue alternate visits with me in the gynecology oncology every 6 months.  # Lung nodules, the new lung nodules have resolved on 10/14/2019  All other nodules are stable.   # neuropathy discussed with patient about trials of Cymbalta.  She agrees.  I send a prescription of Cymbalta 30 mg to her pharmacy.Marland Kitchen  #Port-A-Cath in place,  It has been 2 years since her surgery.  Patient prefers to have port removed at this point and I feel it is very reasonable.  I will refer her to have Mediport removed.  We spent sufficient time to discuss many aspect of care, questions were answered to patient's satisfaction. The patient knows to call the clinic with any problems questions or concerns.  Return of visit: 6 months Orders Placed This Encounter  Procedures  . CBC with Differential/Platelet    Standing Status:   Future    Standing Expiration Date:   09/22/2021  . Comprehensive metabolic panel    Standing Status:   Future    Standing Expiration Date:   09/22/2021  . CA 125    Standing Status:   Future    Standing Expiration Date:   09/22/2021     Earlie Server, MD, PhD Hematology Oncology [facility] Pager- [phone] 09/22/2020

## 2020-09-23 LAB — CA 125: Cancer Antigen (CA) 125: 12.2 U/mL (ref 0.0–38.1)

## 2020-09-25 ENCOUNTER — Telehealth: Payer: Self-pay

## 2020-09-25 DIAGNOSIS — Z95828 Presence of other vascular implants and grafts: Secondary | ICD-10-CM

## 2020-09-25 DIAGNOSIS — C55 Malignant neoplasm of uterus, part unspecified: Secondary | ICD-10-CM

## 2020-09-25 NOTE — Telephone Encounter (Signed)
-----   Message from Earlie Server, MD sent at 09/24/2020  9:52 PM EST ----- Please send her to surgeon to get port removed.

## 2020-09-25 NOTE — Telephone Encounter (Signed)
Order entered for port removal.

## 2020-09-28 ENCOUNTER — Telehealth (INDEPENDENT_AMBULATORY_CARE_PROVIDER_SITE_OTHER): Payer: Self-pay

## 2020-09-28 NOTE — Telephone Encounter (Signed)
I attempted to contact the patient, she has a voicemail box that has not been set up so I was unable to leave a message for a return call.

## 2020-10-05 NOTE — Telephone Encounter (Signed)
I attempted to contact the patient due to the voicemail box not being set up I was unable to leave a message.

## 2020-10-12 ENCOUNTER — Ambulatory Visit: Payer: Medicare Other | Admitting: Oncology

## 2020-10-12 ENCOUNTER — Other Ambulatory Visit: Payer: Medicare Other

## 2020-10-16 ENCOUNTER — Other Ambulatory Visit: Payer: Self-pay | Admitting: Oncology

## 2020-10-16 NOTE — Telephone Encounter (Signed)
I attempted to contact the patient and was unable to leave a message due to voicemail box not being set up.

## 2020-11-17 ENCOUNTER — Ambulatory Visit (INDEPENDENT_AMBULATORY_CARE_PROVIDER_SITE_OTHER): Payer: Medicare Other | Admitting: Physician Assistant

## 2020-11-17 ENCOUNTER — Encounter: Payer: Self-pay | Admitting: Physician Assistant

## 2020-11-17 ENCOUNTER — Other Ambulatory Visit: Payer: Self-pay

## 2020-11-17 VITALS — BP 135/64 | HR 78 | Temp 98.1°F | Wt 189.4 lb

## 2020-11-17 DIAGNOSIS — N390 Urinary tract infection, site not specified: Secondary | ICD-10-CM | POA: Diagnosis not present

## 2020-11-17 LAB — POCT URINALYSIS DIPSTICK
Bilirubin, UA: NEGATIVE
Glucose, UA: NEGATIVE
Ketones, UA: NEGATIVE
Nitrite, UA: NEGATIVE
Protein, UA: NEGATIVE
Spec Grav, UA: 1.015 (ref 1.010–1.025)
Urobilinogen, UA: 0.2 E.U./dL
pH, UA: 6 (ref 5.0–8.0)

## 2020-11-17 MED ORDER — CIPROFLOXACIN HCL 250 MG PO TABS: 250.0000 mg | ORAL_TABLET | Freq: Two times a day (BID) | ORAL | 0 refills | Status: DC

## 2020-11-17 NOTE — Patient Instructions (Signed)
Urinary Tract Infection, Adult  A urinary tract infection (UTI) is an infection of any part of the urinary tract. The urinary tract includes the kidneys, ureters, bladder, and urethra. These organs make, store, and get rid of urine in the body. An upper UTI affects the ureters and kidneys. A lower UTI affects the bladder and urethra. What are the causes? Most urinary tract infections are caused by bacteria in your genital area around your urethra, where urine leaves your body. These bacteria grow and cause inflammation of your urinary tract. What increases the risk? You are more likely to develop this condition if:  You have a urinary catheter that stays in place.  You are not able to control when you urinate or have a bowel movement (incontinence).  You are female and you: ? Use a spermicide or diaphragm for birth control. ? Have low estrogen levels. ? Are pregnant.  You have certain genes that increase your risk.  You are sexually active.  You take antibiotic medicines.  You have a condition that causes your flow of urine to slow down, such as: ? An enlarged prostate, if you are female. ? Blockage in your urethra. ? A kidney stone. ? A nerve condition that affects your bladder control (neurogenic bladder). ? Not getting enough to drink, or not urinating often.  You have certain medical conditions, such as: ? Diabetes. ? A weak disease-fighting system (immunesystem). ? Sickle cell disease. ? Gout. ? Spinal cord injury. What are the signs or symptoms? Symptoms of this condition include:  Needing to urinate right away (urgency).  Frequent urination. This may include small amounts of urine each time you urinate.  Pain or burning with urination.  Blood in the urine.  Urine that smells bad or unusual.  Trouble urinating.  Cloudy urine.  Vaginal discharge, if you are female.  Pain in the abdomen or the lower back. You may also have:  Vomiting or a decreased  appetite.  Confusion.  Irritability or tiredness.  A fever or chills.  Diarrhea. The first symptom in older adults may be confusion. In some cases, they may not have any symptoms until the infection has worsened. How is this diagnosed? This condition is diagnosed based on your medical history and a physical exam. You may also have other tests, including:  Urine tests.  Blood tests.  Tests for STIs (sexually transmitted infections). If you have had more than one UTI, a cystoscopy or imaging studies may be done to determine the cause of the infections. How is this treated? Treatment for this condition includes:  Antibiotic medicine.  Over-the-counter medicines to treat discomfort.  Drinking enough water to stay hydrated. If you have frequent infections or have other conditions such as a kidney stone, you may need to see a health care provider who specializes in the urinary tract (urologist). In rare cases, urinary tract infections can cause sepsis. Sepsis is a life-threatening condition that occurs when the body responds to an infection. Sepsis is treated in the hospital with IV antibiotics, fluids, and other medicines. Follow these instructions at home: Medicines  Take over-the-counter and prescription medicines only as told by your health care provider.  If you were prescribed an antibiotic medicine, take it as told by your health care provider. Do not stop using the antibiotic even if you start to feel better. General instructions  Make sure you: ? Empty your bladder often and completely. Do not hold urine for long periods of time. ? Empty your bladder after   sex. ? Wipe from front to back after urinating or having a bowel movement if you are female. Use each tissue only one time when you wipe.  Drink enough fluid to keep your urine pale yellow.  Keep all follow-up visits. This is important.   Contact a health care provider if:  Your symptoms do not get better after 1-2  days.  Your symptoms go away and then return. Get help right away if:  You have severe pain in your back or your lower abdomen.  You have a fever or chills.  You have nausea or vomiting. Summary  A urinary tract infection (UTI) is an infection of any part of the urinary tract, which includes the kidneys, ureters, bladder, and urethra.  Most urinary tract infections are caused by bacteria in your genital area.  Treatment for this condition often includes antibiotic medicines.  If you were prescribed an antibiotic medicine, take it as told by your health care provider. Do not stop using the antibiotic even if you start to feel better.  Keep all follow-up visits. This is important. This information is not intended to replace advice given to you by your health care provider. Make sure you discuss any questions you have with your health care provider. Document Revised: 06/05/2020 Document Reviewed: 06/05/2020 Elsevier Patient Education  2021 Elsevier Inc.  

## 2020-11-17 NOTE — Progress Notes (Signed)
Established patient visit   Patient: Katherine Snyder   DOB: 1951/04/22   70 y.o. Female  MRN: [id number] Visit Date: 11/17/2020  Today's healthcare provider: Trinna Post, PA-C   Chief Complaint  Patient presents with  . Dysuria  I,Porsha C McClurkin,acting as a scribe for Trinna Post, PA-C.,have documented all relevant documentation on the behalf of Trinna Post, PA-C,as directed by  Trinna Post, PA-C while in the presence of Trinna Post, PA-C.  Subjective    Dysuria  This is a new problem. The current episode started in the past 7 days. The problem occurs every urination. The problem has been unchanged. The quality of the pain is described as burning. The pain is at a severity of 3/10. The pain is mild. There has been no fever. Associated symptoms include urgency. Pertinent negatives include no discharge, flank pain, hesitancy, nausea or vomiting. She has tried home medications and increased fluids (OTC urinary tract medicine) for the symptoms. The treatment provided mild relief.    History of UTI in 06/2020, positive for Pseudomonas.       Medications: Outpatient Medications Prior to Visit  Medication Sig  . betamethasone dipropionate (DIPROLENE) 0.05 % cream Apply 1 application topically 2 (two) times daily as needed (for psoriasis on body).  Marland Kitchen desonide (DESOWEN) 0.05 % cream Apply 1 application topically 2 (two) times daily as needed (for psoriasis on face).  . DULoxetine (CYMBALTA) 30 MG capsule TAKE 1 CAPSULE BY MOUTH EVERY DAY  . lidocaine-prilocaine (EMLA) cream Apply to affected area once  . Multiple Vitamins-Minerals (CENTRUM SILVER ADULT 50+ PO) Take 1 tablet by mouth daily.  . naproxen sodium (ALEVE) 220 MG tablet Take 220 mg by mouth daily as needed.  . traMADol (ULTRAM) 50 MG tablet Take 1 tablet (50 mg total) by mouth every 6 (six) hours as needed for moderate pain or severe pain. (Patient not taking: Reported on 11/17/2020)  . [DISCONTINUED]  ciprofloxacin (CIPRO) 500 MG tablet Take 1 tablet (500 mg total) by mouth 2 (two) times daily. (Patient not taking: Reported on 11/17/2020)   Facility-Administered Medications Prior to Visit  Medication Dose Route Frequency Provider  . sodium chloride flush (NS) 0.9 % injection 10 mL  10 mL Intravenous PRN Earlie Server, MD    Review of Systems  Gastrointestinal: Negative for nausea and vomiting.  Genitourinary: Positive for dysuria and urgency. Negative for flank pain and hesitancy.      Objective    BP 135/64 (BP Location: Left Arm, Patient Position: Sitting, Cuff Size: Large)   Pulse 78   Temp 98.1 F (36.7 C) (Oral)   Wt 189 lb 6.4 oz (85.9 kg)   SpO2 98%   BMI 29.66 kg/m    Physical Exam Constitutional:      General: She is not in acute distress.    Appearance: She is well-developed and well-nourished. She is not diaphoretic.  Cardiovascular:     Rate and Rhythm: Normal rate and regular rhythm.  Pulmonary:     Effort: Pulmonary effort is normal.     Breath sounds: Normal breath sounds.  Abdominal:     General: Bowel sounds are normal. There is no distension.     Palpations: Abdomen is soft.     Tenderness: There is abdominal tenderness in the suprapubic area. There is no CVA tenderness, guarding or rebound.  Skin:    General: Skin is warm and dry.  Neurological:     Mental Status:  She is alert and oriented to person, place, and time.  Psychiatric:        Mood and Affect: Mood and affect normal.        Behavior: Behavior normal.       No results found for any visits on 11/17/20.  Assessment & Plan    1. Urinary tract infection without hematuria, site unspecified  Will treat empirically as below due to history of pseudmonal UTI 06/2020. Return precautions advised.   - ciprofloxacin (CIPRO) 250 MG tablet; Take 1 tablet (250 mg total) by mouth 2 (two) times daily.  Dispense: 6 tablet; Refill: 0 - POCT urinalysis dipstick - Urine Microscopic - Urine  Culture    No follow-ups on file.      ITrinna Post, PA-C, have reviewed all documentation for this visit. The documentation on 11/17/20 for the exam, diagnosis, procedures, and orders are all accurate and complete.  The entirety of the information documented in the History of Present Illness, Review of Systems and Physical Exam were personally obtained by me. Portions of this information were initially documented by [facility] and reviewed by me for thoroughness and accuracy.     Paulene Floor  [facility] [phone] (phone) [phone] (fax)  [place]

## 2020-11-18 ENCOUNTER — Other Ambulatory Visit: Payer: Self-pay | Admitting: Physician Assistant

## 2020-11-18 DIAGNOSIS — N390 Urinary tract infection, site not specified: Secondary | ICD-10-CM

## 2020-11-18 NOTE — Telephone Encounter (Signed)
   Notes to clinic:   Product Backordered/Unavailable:CURRENTLY UNAVAILABLE TO ORDER. WOULD YOU CONSIDER ANOTHER ALTERNATIVE  Requested Prescriptions  Pending Prescriptions Disp Refills   ciprofloxacin (CIPRO) 250 MG tablet [Pharmacy Med Name: CIPROFLOXACIN HCL 250 MG TAB] 6 tablet 0    Sig: TAKE 1 TABLET BY MOUTH TWICE A DAY      Off-Protocol Failed - 11/18/2020  1:05 PM      Failed - Medication not assigned to a protocol, review manually.      Passed - Valid encounter within last 12 months    Recent Outpatient Visits           Yesterday Urinary tract infection without hematuria, site unspecified   [place], [place], [place]   5 months ago Possible urinary tract infection   [facility] [place], [place], [place]   5 months ago Possible urinary tract infection   [facility] [place], [place], [place]   8 months ago Annual physical exam   [place], PA-C   1 year ago [facility] [employer] preventive visit   [place], [place]

## 2020-11-18 NOTE — Telephone Encounter (Signed)
Changed to levaquin based on previous culture being pseudomonas. Cipro was on backorder

## 2020-11-18 NOTE — Telephone Encounter (Signed)
Patient advised as directed below. 

## 2020-11-20 LAB — URINALYSIS, MICROSCOPIC ONLY
Bacteria, UA: NONE SEEN
Casts: NONE SEEN /lpf
WBC, UA: >30 /hpf — AB (ref 0–5)

## 2020-11-20 LAB — URINE CULTURE

## 2020-12-08 NOTE — Progress Notes (Signed)
Subjective:   Katherine Snyder is a 70 y.o. female who presents for Medicare Annual (Subsequent) preventive examination.  Review of Systems    N/A  Cardiac Risk Factors include: advanced age (>83men, >5 women)     Objective:    Today's Vitals   12/09/20 1026  BP: 128/66  Pulse: 70  Temp: 98.7 F (37.1 C)  TempSrc: Oral  SpO2: 97%  Weight: 188 lb (85.3 kg)  Height: 5\' 8"  (1.727 m)  PainSc: 0-No pain   Body mass index is 28.59 kg/m.  Advanced Directives 12/09/2020 09/22/2020 06/24/2020 04/13/2020 04/01/2020 03/13/2020 12/18/2019  Does Patient Have a Medical Advance Directive? No No No No No No No  Would patient like information on creating a medical advance directive? No - Patient declined - No - Patient declined No - Patient declined No - Patient declined - -    Current Medications (verified) Outpatient Encounter Medications as of 12/09/2020  Medication Sig  . betamethasone dipropionate (DIPROLENE) 0.05 % cream Apply 1 application topically 2 (two) times daily as needed (for psoriasis on body).  Marland Kitchen desonide (DESOWEN) 0.05 % cream Apply 1 application topically 2 (two) times daily as needed (for psoriasis on face).  . DULoxetine (CYMBALTA) 30 MG capsule TAKE 1 CAPSULE BY MOUTH EVERY DAY  . lidocaine-prilocaine (EMLA) cream Apply to affected area once (Patient taking differently: Apply 1 application topically. Apply to affected area once when flushing port)  . Multiple Vitamins-Minerals (CENTRUM SILVER ADULT 50+ PO) Take 1 tablet by mouth daily.  . naproxen sodium (ALEVE) 220 MG tablet Take 220 mg by mouth daily as needed.  Marland Kitchen levofloxacin (LEVAQUIN) 500 MG tablet Take 1 tablet (500 mg total) by mouth daily.  . traMADol (ULTRAM) 50 MG tablet Take 1 tablet (50 mg total) by mouth every 6 (six) hours as needed for moderate pain or severe pain. (Patient not taking: No sig reported)   Facility-Administered Encounter Medications as of 12/09/2020  Medication  . sodium chloride flush (NS) 0.9 %  injection 10 mL    Allergies (verified) Patient has no known allergies.   History: Past Medical History:  Diagnosis Date  . Arthritis   . Cancer [facility])    endometrial  . Endometrial mass   . History of chicken pox   . History of kidney stones   . History of measles   . History of mumps   . Osteoporosis   . Personal history of chemotherapy   . Personal history of radiation therapy   . PMB (postmenopausal bleeding)   . Psoriasis    Past Surgical History:  Procedure Laterality Date  . APPENDECTOMY  1960  . BREAST EXCISIONAL BIOPSY Left 1980   neg  . BREAST SURGERY Left    biopsy  . COLONOSCOPY WITH PROPOFOL N/A 03/13/2020   Procedure: COLONOSCOPY WITH PROPOFOL;  Surgeon: Jonathon Bellows, MD;  Location: [facility] ENDOSCOPY;  Service: Gastroenterology;  Laterality: N/A;  . Injection varicose veins in legs Bilateral 2010   Dr. Hulda Humphrey  . LITHOTRIPSY  1994, 2004   for renal stones  . PORTA CATH INSERTION N/A 04/24/2018   Procedure: PORTA CATH INSERTION;  Surgeon: Katha Cabal, MD;  Location: [place] CV LAB;  Service: Cardiovascular;  Laterality: N/A;  . ROBOTIC ASSISTED TOTAL HYSTERECTOMY Bilateral 04/04/2018   Procedure: ROBOTIC ASSISTED TOTAL HYSTERECTOMY,SENTINEL LYMPH NODE MAPPING AND BIOPSIES,AORTIC LYMPH NODE DISSECTION;  Surgeon: Gillis Ends, MD;  Location: [facility] ORS;  Service: Gynecology;  Laterality: Bilateral;  . [place]  Family History  Problem Relation Age of Onset  . Breast cancer Mother   . Transient ischemic attack Mother   . Arthritis Mother   . Hypothyroidism Mother   . Lung cancer Mother   . Diabetes Father        type 2  . Hypertension Father   . Arthritis Father   . Colon cancer Sister   . Prostate cancer Brother   . Hyperlipidemia Son   . Stomach cancer Maternal Grandmother   . Stroke Maternal Grandfather    Social History   Socioeconomic History  . Marital status: Divorced    Spouse name: single  . Number of children:  1  . Years of education: Not on file  . Highest education level: 10th grade  Occupational History  . Occupation: retired  Tobacco Use  . Smoking status: Former Smoker    Packs/day: 0.25    Types: Cigarettes    Quit date: 12/08/2018    Years since quitting: 2.0  . Smokeless tobacco: Never Used  Vaping Use  . Vaping Use: Some days  Substance and Sexual Activity  . Alcohol use: No    Alcohol/week: 0.0 standard drinks  . Drug use: No  . Sexual activity: Not Currently  Other Topics Concern  . Not on file  Social History Narrative  . Not on file   Social Determinants of Health   Financial Resource Strain: Low Risk   . Difficulty of Paying Living Expenses: Not hard at all  Food Insecurity: No Food Insecurity  . Worried About Charity fundraiser in the Last Year: Never true  . Ran Out of Food in the Last Year: Never true  Transportation Needs: No Transportation Needs  . Lack of Transportation (Medical): No  . Lack of Transportation (Non-Medical): No  Physical Activity: Inactive  . Days of Exercise per Week: 0 days  . Minutes of Exercise per Session: 0 min  Stress: No Stress Concern Present  . Feeling of Stress : Not at all  Social Connections: Socially Isolated  . Frequency of Communication with Friends and Family: Once a week  . Frequency of Social Gatherings with Friends and Family: More than three times a week  . Attends Religious Services: Never  . Active Member of Clubs or Organizations: No  . Attends Archivist Meetings: Never  . Marital Status: Divorced    Tobacco Counseling Counseling given: Not Answered   Clinical Intake:  Pre-visit preparation completed: Yes  Pain : No/denies pain Pain Score: 0-No pain     Nutritional Status: BMI 25 -29 Overweight Nutritional Risks: None Diabetes: No  How often do you need to have someone help you when you read instructions, pamphlets, or other written materials from your doctor or pharmacy?: 1 -  Never  Diabetic? No  Interpreter Needed?: No  Information entered by :: [facility], LPN   Activities of Daily Living In your present state of health, do you have any difficulty performing the following activities: 12/09/2020 02/25/2020  Hearing? N N  Vision? N N  Comment Wears eye glasses. -  Difficulty concentrating or making decisions? N N  Walking or climbing stairs? N N  Dressing or bathing? N N  Doing errands, shopping? N N  Preparing Food and eating ? N -  Using the Toilet? N -  In the past six months, have you accidently leaked urine? N -  Do you have problems with loss of bowel control? N -  Managing your Medications? N -  Managing your Finances? N -  Housekeeping or managing your Housekeeping? N -  Some recent data might be hidden    Patient Care Team: Paulene Floor as PCP - General (Physician Assistant) Mellody Drown, MD as Referring Physician (Obstetrics and Gynecology) Clent Jacks, RN as Oncology Nurse Navigator Noreene Filbert, MD as Referring Physician (Radiation Oncology) Earlie Server, MD as Consulting Physician (Oncology) [place], [facility])  Indicate any recent Medical Services you may have received from other than [facility] providers in the past year (date may be approximate).     Assessment:   This is a routine wellness examination for [place].  Hearing/Vision screen No exam data present  Dietary issues and exercise activities discussed: Current Exercise Habits: The patient does not participate in regular exercise at present, Exercise limited by: None identified  Goals    . DIET - INCREASE WATER INTAKE     Recommend to drink at least 6-8 8oz glasses of water per day.      Depression Screen PHQ 2/9 Scores 12/09/2020 02/25/2020 01/28/2019 05/13/2016  PHQ - 2 Score 0 0 0 0  PHQ- 9 Score - - 0 -    Fall Risk Fall Risk  12/09/2020 02/25/2020 01/28/2019  Falls in the past year? 0 0 0  Number falls in past yr: 0 - -  Injury with Fall?  0 - -    FALL RISK PREVENTION PERTAINING TO THE HOME:  Any stairs in or around the home? Yes  If so, are there any without handrails? No  Home free of loose throw rugs in walkways, pet beds, electrical cords, etc? Yes  Adequate lighting in your home to reduce risk of falls? Yes   ASSISTIVE DEVICES UTILIZED TO PREVENT FALLS:  Life alert? No  Use of a cane, walker or w/c? No  Grab bars in the bathroom? Yes  Shower chair or bench in shower? Yes  Elevated toilet seat or a handicapped toilet? No   TIMED UP AND GO:  Was the test performed? Yes .  Length of time to ambulate 10 feet: 10 sec.   Gait steady and fast without use of assistive device  Cognitive Function:      6CIT Screen 12/09/2020  What Year? 0 points  What month? 0 points  What time? 0 points  Count back from 20 0 points  Months in reverse 0 points  Repeat phrase 0 points  Total Score 0    Immunizations Immunization History  Administered Date(s) Administered  . Influenza, High Dose Seasonal PF 08/03/2019, 08/07/2020  . Influenza-Unspecified 08/16/2016  . Pneumococcal Conjugate-13 01/28/2019  . Pneumococcal Polysaccharide-23 10/08/2009, 12/09/2020  . Td 01/28/2019  . Tdap 10/08/2009    TDAP status: Up to date  Flu Vaccine status: Up to date  Pneumococcal vaccine status: Completed during today's visit.  Covid-19 vaccine status: Completed vaccines  Qualifies for Shingles Vaccine? Yes   Zostavax completed No   Shingrix Completed?: No.    Education has been provided regarding the importance of this vaccine. Patient has been advised to call insurance company to determine out of pocket expense if they have not yet received this vaccine. Advised may also receive vaccine at local pharmacy or Health Dept. Verbalized acceptance and understanding.  Screening Tests Health Maintenance  Topic Date Due  . COVID-19 Vaccine (1) Never done  . DEXA SCAN  12/09/2016  . MAMMOGRAM  03/04/2022  . COLONOSCOPY (Pts  45-41yrs Insurance coverage will need to be confirmed)  03/13/2025  .  TETANUS/TDAP  01/27/2029  . INFLUENZA VACCINE  Completed  . Hepatitis C Screening  Completed  . PNA vac Low Risk Adult  Completed    Health Maintenance  Health Maintenance Due  Topic Date Due  . COVID-19 Vaccine (1) Never done  . DEXA SCAN  12/09/2016    Colorectal cancer screening: Type of screening: Colonoscopy. Completed 03/13/20. Repeat every 5 years  Mammogram status: Completed 03/04/20. Repeat every year  Bone Density status: Ordered today. Pt provided with contact info and advised to call to schedule appt.  Lung Cancer Screening: (Low Dose CT Chest recommended if Age 44-80 years, 30 pack-year currently smoking OR have quit w/in 15years.) does qualify however had this completed 06/2020. Repeat yearly.  Additional Screening:  Hepatitis C Screening: Up to date  Vision Screening: Recommended annual ophthalmology exams for early detection of glaucoma and other disorders of the eye. Is the patient up to date with their annual eye exam?  Yes  Who is the provider or what is the name of the office in which the patient attends annual eye exams? [place] If pt is not established with a provider, would they like to be referred to a provider to establish care? No .   Dental Screening: Recommended annual dental exams for proper oral hygiene  Community Resource Referral / Chronic Care Management: CRR required this visit?  No   CCM required this visit?  No      Plan:     I have personally reviewed and noted the following in the patient's chart:   . Medical and social history . Use of alcohol, tobacco or illicit drugs  . Current medications and supplements . Functional ability and status . Nutritional status . Physical activity . Advanced directives . List of other physicians . Hospitalizations, surgeries, and ER visits in previous 12 months . Vitals . Screenings to include cognitive, depression, and  falls . Referrals and appointments  In addition, I have reviewed and discussed with patient certain preventive protocols, quality metrics, and best practice recommendations. A written personalized care plan for preventive services as well as general preventive health recommendations were provided to patient.     [place], [place]   [id number]   Nurse Notes: None.

## 2020-12-09 ENCOUNTER — Other Ambulatory Visit: Payer: Self-pay

## 2020-12-09 ENCOUNTER — Ambulatory Visit (INDEPENDENT_AMBULATORY_CARE_PROVIDER_SITE_OTHER): Payer: Medicare Other

## 2020-12-09 VITALS — BP 128/66 | HR 70 | Temp 98.7°F | Ht 68.0 in | Wt 188.0 lb

## 2020-12-09 DIAGNOSIS — M810 Age-related osteoporosis without current pathological fracture: Secondary | ICD-10-CM | POA: Diagnosis not present

## 2020-12-09 DIAGNOSIS — Z0000 Encounter for general adult medical examination without abnormal findings: Secondary | ICD-10-CM | POA: Diagnosis not present

## 2020-12-09 DIAGNOSIS — Z23 Encounter for immunization: Secondary | ICD-10-CM

## 2020-12-09 NOTE — Patient Instructions (Signed)
[name] , Thank you for taking time to come for your Medicare Wellness Visit. I appreciate your ongoing commitment to your health goals. Please review the following plan we discussed and let me know if I can assist you in the future.   Screening recommendations/referrals: Colonoscopy: Up to date, due 03/2025 Mammogram: Up to date, due 02/2021 Bone Density: Ordered today. Pt aware office will contact her to schedule apt.  Recommended yearly ophthalmology/optometry visit for glaucoma screening and checkup Recommended yearly dental visit for hygiene and checkup  Vaccinations: Influenza vaccine: Done 08/2020 Pneumococcal vaccine: Completed series Tdap vaccine: Up to date, due 01/2029 Shingles vaccine: Shingrix discussed. Please contact your pharmacy for coverage information.     Advanced directives: Advance directive discussed with you today. Even though you declined this today please call our office should you change your mind and we can give you the proper paperwork for you to fill out.  Conditions/risks identified: Recommend to drink at least 6-8 8oz glasses of water per day.  Next appointment: 02/25/21 @ 9:00 AM with [name].   Preventive Care 68 Years and Older, Female Preventive care refers to lifestyle choices and visits with your health care provider that can promote health and wellness. What does preventive care include?  A yearly physical exam. This is also called an annual well check.  Dental exams once or twice a year.  Routine eye exams. Ask your health care provider how often you should have your eyes checked.  Personal lifestyle choices, including:  Daily care of your teeth and gums.  Regular physical activity.  Eating a healthy diet.  Avoiding tobacco and drug use.  Limiting alcohol use.  Practicing safe sex.  Taking low-dose aspirin every day.  Taking vitamin and mineral supplements as recommended by your health care provider. What happens during an  annual well check? The services and screenings done by your health care provider during your annual well check will depend on your age, overall health, lifestyle risk factors, and family history of disease. Counseling  Your health care provider may ask you questions about your:  Alcohol use.  Tobacco use.  Drug use.  Emotional well-being.  Home and relationship well-being.  Sexual activity.  Eating habits.  History of falls.  Memory and ability to understand (cognition).  Work and work Statistician.  Reproductive health. Screening  You may have the following tests or measurements:  Height, weight, and BMI.  Blood pressure.  Lipid and cholesterol levels. These may be checked every 5 years, or more frequently if you are over 109 years old.  Skin check.  Lung cancer screening. You may have this screening every year starting at age 70 if you have a 30-pack-year history of smoking and currently smoke or have quit within the past 15 years.  Fecal occult blood test (FOBT) of the stool. You may have this test every year starting at age 10.  Flexible sigmoidoscopy or colonoscopy. You may have a sigmoidoscopy every 5 years or a colonoscopy every 10 years starting at age 56.  Hepatitis C blood test.  Hepatitis B blood test.  Sexually transmitted disease (STD) testing.  Diabetes screening. This is done by checking your blood sugar (glucose) after you have not eaten for a while (fasting). You may have this done every 1-3 years.  Bone density scan. This is done to screen for osteoporosis. You may have this done starting at age 28.  Mammogram. This may be done every 1-2 years. Talk to your health care  provider about how often you should have regular mammograms. Talk with your health care provider about your test results, treatment options, and if necessary, the need for more tests. Vaccines  Your health care provider may recommend certain vaccines, such as:  Influenza  vaccine. This is recommended every year.  Tetanus, diphtheria, and acellular pertussis (Tdap, Td) vaccine. You may need a Td booster every 10 years.  Zoster vaccine. You may need this after age 67.  Pneumococcal 13-valent conjugate (PCV13) vaccine. One dose is recommended after age 65.  Pneumococcal polysaccharide (PPSV23) vaccine. One dose is recommended after age 32. Talk to your health care provider about which screenings and vaccines you need and how often you need them. This information is not intended to replace advice given to you by your health care provider. Make sure you discuss any questions you have with your health care provider. Document Released: 11/20/2015 Document Revised: 07/13/2016 Document Reviewed: 08/25/2015 Elsevier Interactive Patient Education  2017 [place] Prevention in the Home Falls can cause injuries. They can happen to people of all ages. There are many things you can do to make your home safe and to help prevent falls. What can I do on the outside of my home?  Regularly fix the edges of walkways and driveways and fix any cracks.  Remove anything that might make you trip as you walk through a door, such as a raised step or threshold.  Trim any bushes or trees on the path to your home.  Use bright outdoor lighting.  Clear any walking paths of anything that might make someone trip, such as rocks or tools.  Regularly check to see if handrails are loose or broken. Make sure that both sides of any steps have handrails.  Any raised decks and porches should have guardrails on the edges.  Have any leaves, snow, or ice cleared regularly.  Use sand or salt on walking paths during winter.  Clean up any spills in your garage right away. This includes oil or grease spills. What can I do in the bathroom?  Use night lights.  Install grab bars by the toilet and in the tub and shower. Do not use towel bars as grab bars.  Use non-skid mats or decals  in the tub or shower.  If you need to sit down in the shower, use a plastic, non-slip stool.  Keep the floor dry. Clean up any water that spills on the floor as soon as it happens.  Remove soap buildup in the tub or shower regularly.  Attach bath mats securely with double-sided non-slip rug tape.  Do not have throw rugs and other things on the floor that can make you trip. What can I do in the bedroom?  Use night lights.  Make sure that you have a light by your bed that is easy to reach.  Do not use any sheets or blankets that are too big for your bed. They should not hang down onto the floor.  Have a firm chair that has side arms. You can use this for support while you get dressed.  Do not have throw rugs and other things on the floor that can make you trip. What can I do in the kitchen?  Clean up any spills right away.  Avoid walking on wet floors.  Keep items that you use a lot in easy-to-reach places.  If you need to reach something above you, use a strong step stool that has a grab bar.  Keep electrical cords out of the way.  Do not use floor polish or wax that makes floors slippery. If you must use wax, use non-skid floor wax.  Do not have throw rugs and other things on the floor that can make you trip. What can I do with my stairs?  Do not leave any items on the stairs.  Make sure that there are handrails on both sides of the stairs and use them. Fix handrails that are broken or loose. Make sure that handrails are as long as the stairways.  Check any carpeting to make sure that it is firmly attached to the stairs. Fix any carpet that is loose or worn.  Avoid having throw rugs at the top or bottom of the stairs. If you do have throw rugs, attach them to the floor with carpet tape.  Make sure that you have a light switch at the top of the stairs and the bottom of the stairs. If you do not have them, ask someone to add them for you. What else can I do to help  prevent falls?  Wear shoes that:  Do not have high heels.  Have rubber bottoms.  Are comfortable and fit you well.  Are closed at the toe. Do not wear sandals.  If you use a stepladder:  Make sure that it is fully opened. Do not climb a closed stepladder.  Make sure that both sides of the stepladder are locked into place.  Ask someone to hold it for you, if possible.  Clearly mark and make sure that you can see:  Any grab bars or handrails.  First and last steps.  Where the edge of each step is.  Use tools that help you move around (mobility aids) if they are needed. These include:  Canes.  Walkers.  Scooters.  Crutches.  Turn on the lights when you go into a dark area. Replace any light bulbs as soon as they burn out.  Set up your furniture so you have a clear path. Avoid moving your furniture around.  If any of your floors are uneven, fix them.  If there are any pets around you, be aware of where they are.  Review your medicines with your doctor. Some medicines can make you feel dizzy. This can increase your chance of falling. Ask your doctor what other things that you can do to help prevent falls. This information is not intended to replace advice given to you by your health care provider. Make sure you discuss any questions you have with your health care provider. Document Released: 08/20/2009 Document Revised: 03/31/2016 Document Reviewed: 11/28/2014 Elsevier Interactive Patient Education  2017 [employer].

## 2020-12-30 ENCOUNTER — Encounter: Payer: Self-pay | Admitting: Obstetrics and Gynecology

## 2020-12-30 ENCOUNTER — Inpatient Hospital Stay: Payer: Medicare Other | Attending: Oncology | Admitting: Obstetrics and Gynecology

## 2020-12-30 VITALS — BP 129/65 | HR 63 | Temp 98.0°F | Resp 20 | Wt 183.7 lb

## 2020-12-30 DIAGNOSIS — Z08 Encounter for follow-up examination after completed treatment for malignant neoplasm: Secondary | ICD-10-CM | POA: Diagnosis not present

## 2020-12-30 DIAGNOSIS — Z9221 Personal history of antineoplastic chemotherapy: Secondary | ICD-10-CM | POA: Diagnosis not present

## 2020-12-30 DIAGNOSIS — Z923 Personal history of irradiation: Secondary | ICD-10-CM | POA: Diagnosis not present

## 2020-12-30 DIAGNOSIS — Z8542 Personal history of malignant neoplasm of other parts of uterus: Secondary | ICD-10-CM | POA: Diagnosis present

## 2020-12-30 DIAGNOSIS — Z87891 Personal history of nicotine dependence: Secondary | ICD-10-CM | POA: Diagnosis not present

## 2020-12-30 DIAGNOSIS — Z90722 Acquired absence of ovaries, bilateral: Secondary | ICD-10-CM | POA: Diagnosis not present

## 2020-12-30 DIAGNOSIS — M722 Plantar fascial fibromatosis: Secondary | ICD-10-CM | POA: Diagnosis not present

## 2020-12-30 DIAGNOSIS — C55 Malignant neoplasm of uterus, part unspecified: Secondary | ICD-10-CM

## 2020-12-30 DIAGNOSIS — Z90710 Acquired absence of both cervix and uterus: Secondary | ICD-10-CM | POA: Insufficient documentation

## 2020-12-30 DIAGNOSIS — Z95828 Presence of other vascular implants and grafts: Secondary | ICD-10-CM

## 2020-12-30 NOTE — H&P (View-Only) (Signed)
Gynecologic Oncology Interval Visit   Referring Provider: Dr. Amalia Hailey  Chief Complaint: Endometrial Cancer  Subjective:  Katherine Snyder is a 70 y.o. 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  Doing well with now new significant complaints.   Colonoscopy on 03/13/20 with benign polyps and she will repeat in 5 years. Mammogram 03/04/20 was negative.   Urinary symptoms and pelvic pain last year.  CT scan was normal, but she subsequently passed a kidney stone and felt better.   CT scan 8/21 IMPRESSION: 1. No acute intrathoracic, abdominal, or pelvic pathology. 2. No evidence of metastatic disease in the chest, abdomen, or pelvis. 3. Bilateral pulmonary nodules similar to prior CT. 4. Small nonobstructing right renal interpolar calculus. 5. Colonic diverticulosis. No bowel obstruction. 6. Aortic Atherosclerosis (ICD10-I70.0).   No bleeding or discharge.  Has chronic leg swelling which is no worse.  No abdominal pain, early satiety, or changes in bowel movements.  Has chronic night sweats for decades which are unchanged.  No shortness of breath or chest pain.  GYNECOLOGIC ONCOLOGY HISTORY:  Initially, patient presented to ER on 02/26/18 for PMB. She was reportedly 14 years post menopausal and had had a 2 day episode of vaginal bleeding that was constant.   02/26/18- Ultrasound Pelvic Complete w/ Transvaginal: Endometrium thickness up to 27mm. No focal solid or cystic change. IMPRESSION: Mildly enlarged uterus. No discrete fibroids. Markedly thickened endometrium. In the setting of post-menopausal bleeding, endometrial sampling is indicated to exclude carcinoma. If results are benign, sonohysterogram should be considered for focal lesion work-up. (Ref: Radiological Reasoning: Algorithmic Workup of Abnormal Vaginal Bleeding with  Endovaginal Sonography and Sonohysterography. AJR 2008; 509:T26-71) Nonvisualization of the right ovary. Normal-appearing left ovary. No significant uterine fibroids were observed.  She was seen by Dr. Ellard Artis son 03/01/18 who performed vacurette endometrial biopsy. Uterus was sounded to length to 9 cm. She was started on Aygestin and bleeding stopped.   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  COMMENT: BASED ON INITIAL HISTOLOGIC FINDINGS, IMMUNOHISTOCHEMISTRY IS EVALUATED:  THE SARCOMATOUS TUMOR COMPONENT IS DIFFUSELY POSITIVE FOR CD10 AND THE CARCINOMA COMPONENT IS POSITIVE FOR PANKERATIN, SUPPORTING THE DIAGNOSIS.   She was seen by Dr. Fransisca Connors, Gyn-Onc on 03/28/2018 for initial evaluation and treatment planning.  TLH BSO with sentinel lymph node mapping and biopsies and aortic lymph node dissection were discussed.  [facility] but unfortunately, patient's insurance will not cover.   CA 125- 10.5  03/30/18 - CT C/A/P W CONTRAST IMPRESSION: 1. Mass like expansion of the endometrial cavity known primary endometrial carcinoma. 2. No evidence for nodal metastasis or solid organ metastasis within the abdomen or pelvis. 3. Scattered nonspecific solid nodules and a single part solid nodule identified in both lungs. Consensus criteria recommendations for nodule followup does not apply to patients with known primary malignancy. 4. Liver cysts.  She underwent CT Imaging and then robot assisted total hysterectomy with sentinel LN mapping, biopsies, and aortic LN dissection with Dr. Theora Gianotti at [facility] 04/04/18.   A. UTERUS WITH CERVIX; HYSTERECTOMY:  - CARCINOSARCOMA.  - MYOMETRIAL INVASION PRESENT, LESS THAN HALF OF THE MYOMETRIUM.  - NEGATIVE FOR CERVICAL AND SEROSAL INVOLVEMENT.  - SUBMUCOSAL LEIOMYOMA.   FALLOPIAN TUBE AND OVARY, LEFT; SALPINGO-OOPHORECTOMY:   - NEGATIVE FOR MALIGNANCY.  -  OVARIAN CORTICAL INCLUSION CYSTS.  - ATROPHIC FALLOPIAN TUBE.   B. SENTINEL LYMPH NODE, RIGHT PRIMARY OBTURATOR; EXCISION:  - NEGATIVE FOR MALIGNANCY, ONE LYMPH NODE (0/1).   C. LYMPH NODE, RIGHT EXTERNAL ILIAC VEIN; EXCISION:  - NEGATIVE FOR MALIGNANCY, ONE LYMPH NODE (0/1).   D. FALLOPIAN TUBE AND OVARY, UNILATERAL (RIGHT); SALPINGO-OOPHORECTOMY:  - NEGATIVE FOR MALIGNANCY.  - OVARIAN CORTICAL INCLUSION CYSTS.  - ATROPHIC FALLOPIAN TUBE.   E. SENTINEL LYMPH NODE, RIGHT LOW PARA-AORTIC; EXCISION:  - NEGATIVE FOR MALIGNANCY, TWO LYMPH NODES (0/2).   F. SENTINEL LYMPH NODE, LEFT PRIMARY LOW PARA-AORTIC; EXCISION:  - NEGATIVE FOR MALIGNANCY, ONE LYMPH NODE (0/1).   DIAGNOSIS:  A. PELVIC WASHINGS:  - NEGATIVE FOR MALIGNANCY.    Her case was discussed at [facility] and discussed that recurrence risk without adjuvant treatment is up to 50%.  So recommend adjuvant chemo and radiation.  She was seen by Dr. Baruch Gouty, for initial evaluation of stage IA uterine carcinosarcoma and plan is for brachytherapy after 6 cycles of carbo/taxol.    She initiated carbo-Taxol on 04/25/2018.  Completed 6 cycles on 08/08/2018.  She received vaginal brachytherapy with Dr. Baruch Gouty 10/19-11/09/2018.   She had thyroid ultrasound for incidental thyroid nodule seen on surveillance imaging for indeterminate pulmonary nodules on 5/20. Biopsy was benign.   07/11/18- CT Chest WO Contrast for follow-up on pulmonary nodules IMPRESSION: 1. Previously visualized pulmonary nodules are either stable or resolved. Several new subcentimeter bilateral pulmonary nodules. Pulmonary nodularity generally appears centrilobular in distribution and while considered indeterminate is more suggestive of infectious/inflammatory nodularity. Continued chest CT surveillance is recommended in 3-6 months. 2. No thoracic adenopathy.  12/10/2018- CT Chest wo contrast - New sub solid density measuring 12 mm  with associated 3 mm solid nodule is noted in left upper lobe. There also noted several other new nodules noted more posteriorly in the left upper lobe, with the largest measuring 3 mm. These may simply be inflammatory in etiology, but neoplasm can not be excluded. Initial follow-up by chest CT without contrast is recommended in 3 months to confirm persistence. - Stable sub solid density is noted in right upper lobe with grossly stable 4 mm solid nodule. Multiple other pulmonary nodules are noted in both lungs which are stable compared to prior exam - Small nonobstructive right renal calculus - Stable left thyroid nodule - Stable hepatic cyst  She stopped smoking 2/20, but occasionally using vaping products.    CT scan chest 5/20 IMPRESSION: 1. Stable scattered pulmonary nodules as detailed above including a 1.2 cm part solid nodule in the right upper lobe. The solid component remains stable at approximately 0.4 cm. Follow-up non-contrast CT recommended at 3-6 months to confirm persistence. If unchanged, and solid component remains <6 mm, annual CT is recommended until 5 years of stability has been established. If persistent these nodules should be considered highly suspicious if the solid component of the nodule is 6 mm or greater in size and enlarging. This recommendation follows the consensus statement: Guidelines for Management of Incidental Pulmonary Nodules Detected on CT Images: From the Fleischner Society 2017; Radiology 2017; 284:228-243. 2. Additional scattered pulmonary nodules as detailed above measuring up to approximately 8 mm. These are stable from prior study. Attention on yearly follow-up examinations is recommended. 3. Bilateral nonobstructing renal nephroliths are again noted.  4. Mild bilateral interlobular septal thickening consistent with mild volume overload.   Problem List: Patient Active Problem List   Diagnosis Date Noted  . Neuropathy 05/16/2018  .  Fatigue 05/16/2018   . Inflammatory heel pain, right 05/16/2018  . Encounter for antineoplastic chemotherapy 05/16/2018  . Drug-induced neutropenia ([place]) 05/09/2018  . Goals of care, counseling/discussion 04/18/2018  . Endometrial cancer ([place])   . Seborrhea 05/13/2016  . History of kidney stones 06/26/2015  . OP (osteoporosis) 06/26/2015  . Plantar fasciitis 06/26/2015  . Current tobacco use 06/26/2015  . Kidney stone 06/21/2013  . Chronic airway obstruction ([place]) 10/08/2009  . Psoriasis 09/30/2008  . 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 Menopausal symptom 09/21/2007  . Tobacco use 09/21/2007  . Awareness of heartbeats 09/21/2007    Past Medical History: Past Medical History:  Diagnosis Date  . Arthritis   . Cancer [facility])    endometrial  . Endometrial mass   . History of chicken pox   . History of kidney stones   . History of measles   . History of mumps   . Osteoporosis   . Personal history of chemotherapy   . Personal history of radiation therapy   . PMB (postmenopausal bleeding)   . Psoriasis     Past Surgical History: Past Surgical History:  Procedure Laterality Date  . APPENDECTOMY  1960  . BREAST EXCISIONAL BIOPSY Left 1980   neg  . BREAST SURGERY Left    biopsy  . COLONOSCOPY WITH PROPOFOL N/A 03/13/2020   Procedure: COLONOSCOPY WITH PROPOFOL;  Surgeon: Jonathon Bellows, MD;  Location[facility] ENDOSCOPY;  Service: Gastroenterology;  Laterality: N/A;  . Injection varicose veins in legs Bilateral 2010   Dr. Hulda Humphrey  . LITHOTRIPSY  1994, 2004   for renal stones  . PORTA CATH INSERTION N/A 04/24/2018   Procedure: PORTA CATH INSERTION;  Surgeon: Katha Cabal, MD;  Location: [place] CV LAB;  Service: Cardiovascular;  Laterality: N/A;  . ROBOTIC ASSISTED TOTAL HYSTERECTOMY Bilateral 04/04/2018   Procedure: ROBOTIC ASSISTED TOTAL HYSTERECTOMY,SENTINEL LYMPH NODE MAPPING AND BIOPSIES,AORTIC LYMPH NODE DISSECTION;  Surgeon: Gillis Ends, MD;  Location:  [facility] ORS;  Service: Gynecology;  Laterality: Bilateral;  . TUBAL LIGATION  1980    OB History:  OB History  Gravida Para Term Preterm AB Living  1 1 1     1   SAB IAB Ectopic Multiple Live Births          1    # Outcome Date GA Lbr Len/2nd Weight Sex Delivery Anes PTL Lv  1 Term 14    M Vag-Spont   LIV  [id number] LMP:   Family History: Family History  Problem Relation Age of Onset  . Breast cancer Mother   . Transient ischemic attack Mother   . Arthritis Mother   . Hypothyroidism Mother   . Lung cancer Mother   . Diabetes Father        type 2  . Hypertension Father   . Arthritis Father   . Colon cancer Sister   . Prostate cancer Brother   . Hyperlipidemia Son   . Stomach cancer Maternal Grandmother   . Stroke Maternal Grandfather     Social History: Social History   Socioeconomic History  . Marital status: Divorced    Spouse name: single  . Number of children: 1  . Years of education: Not on file  . Highest education level: 10th grade  Occupational History  . Occupation: retired  Tobacco Use  . Smoking status: Former Smoker    Packs/day: 0.25    Types: Cigarettes    Quit date: 12/08/2018    Years since quitting: 2.0  .  Smokeless tobacco: Never Used  Vaping Use  . Vaping Use: Some days  Substance and Sexual Activity  . Alcohol use: No    Alcohol/week: 0.0 standard drinks  . Drug use: No  . Sexual activity: Not Currently  Other Topics Concern  . Not on file  Social History Narrative  . Not on file   Social Determinants of Health   Financial Resource Strain: Low Risk   . Difficulty of Paying Living Expenses: Not hard at all  Food Insecurity: No Food Insecurity  . Worried About Charity fundraiser in the Last Year: Never true  . Ran Out of Food in the Last Year: Never true  Transportation Needs: No Transportation Needs  . Lack of Transportation (Medical): No  . Lack of Transportation (Non-Medical): No  Physical Activity: Inactive  . Days of  Exercise per Week: 0 days  . Minutes of Exercise per Session: 0 min  Stress: No Stress Concern Present  . Feeling of Stress : Not at all  Social Connections: Socially Isolated  . Frequency of Communication with Friends and Family: Once a week  . Frequency of Social Gatherings with Friends and Family: More than three times a week  . Attends Religious Services: Never  . Active Member of Clubs or Organizations: No  . Attends Archivist Meetings: Never  . Marital Status: Divorced  Human resources officer Violence: Not At Risk  . Fear of Current or Ex-Partner: No  . Emotionally Abused: No  . Physically Abused: No  . Sexually Abused: No    Allergies: No Known Allergies  Current Medications: Current Outpatient Medications  Medication Sig Dispense Refill  . DULoxetine (CYMBALTA) 30 MG capsule TAKE 1 CAPSULE BY MOUTH EVERY DAY 90 capsule 1  . lidocaine-prilocaine (EMLA) cream Apply to affected area once (Patient taking differently: Apply 1 application topically. Apply to affected area once when flushing port) 30 g 3  . Multiple Vitamins-Minerals (CENTRUM SILVER ADULT 50+ PO) Take 1 tablet by mouth daily.    . naproxen sodium (ALEVE) 220 MG tablet Take 220 mg by mouth daily as needed.    . betamethasone dipropionate (DIPROLENE) 0.05 % cream Apply 1 application topically 2 (two) times daily as needed (for psoriasis on body). (Patient not taking: Reported on 12/30/2020) 30 g 1  . desonide (DESOWEN) 0.05 % cream Apply 1 application topically 2 (two) times daily as needed (for psoriasis on face). (Patient not taking: Reported on 12/30/2020) 30 g 1  . levofloxacin (LEVAQUIN) 500 MG tablet Take 1 tablet (500 mg total) by mouth daily. (Patient not taking: Reported on 12/30/2020) 7 tablet 0  . traMADol (ULTRAM) 50 MG tablet Take 1 tablet (50 mg total) by mouth every 6 (six) hours as needed for moderate pain or severe pain. (Patient not taking: No sig reported) 15 tablet 0   No current  facility-administered medications for this visit.   Facility-Administered Medications Ordered in Other Visits  Medication Dose Route Frequency Provider Last Rate Last Admin  . sodium chloride flush (NS) 0.9 % injection 10 mL  10 mL Intravenous PRN Earlie Server, MD   10 mL at 05/06/20 1305   Review of Systems General:  Night sweats - chronic & unchanged Skin: no complaints Eyes: no complaints HEENT: no complaints Breasts: no complaints Pulmonary: no complaints Cardiac: leg swelling bilaterally - chronic and unchanged.  Gastrointestinal: no complaints Genitourinary/Sexual: dysuria per hpi Ob/Gyn: no complaints Musculoskeletal: no complaints Hematology: no complaints Neurologic/Psych: no complaints   Objective:  Physical  Examination:  Vitals:   12/30/20 1428  BP: 129/65  Pulse: 63  Resp: 20  Temp: 98 F (36.7 C)  SpO2: 100%   Pain 9/10- pelvic-lower   ECOG Performance Status: 1 - Symptomatic but completely ambulatory  GENERAL: Patient is a well appearing female in no acute distress HEENT:  Sclera clear. Anicteric. Negative thyroid exam.  NODES:  Negative axillary, supraclavicular, inguinal lymph node survery LUNGS:  Clear to auscultation bilaterally.   HEART:  Regular rate and rhythm.  ABDOMEN:  Soft, nontender.  No hernias, incisions well healed. No masses or ascites EXTREMITIES:  No peripheral edema. Atraumatic. No cyanosis SKIN:  Clear with no obvious rashes or skin changes.  NEURO:  Nonfocal. Well oriented.  Appropriate affect.  PELVIC: Exam chaperoned by cma. EGBUS: normal, Vagina: atropic with no lesions. No bleeding or discharge. No lesions. Bimanual/RV: normal, no masses.    Assessment:  Katherine Snyder is a 70 y.o. female diagnosed with stage IA uterine carcinosarcoma with RA TLH/BSO, negative staging 5/19 followed by vaginal brachytherapy and 6 cycles of carbo/taxol chemotherapy completed 08/08/2018.  NED on exam today.   Pelvic pain and urinary symptoms refractory  to antibiotics 8/21.  CT scan was normal, but subsequently she passed a kidney stone. No complaints now.  Chest CTs have  shown indeterminate lung nodules, some disapearing over time and some new thought to be benign.  She was a smoker, but stopped in 2/20.    Comorbidities complicating care: smoker, plantar fasciitis  Plan:   Problem List Items Addressed This Visit   None   Visit Diagnoses    Uterine carcinosarcoma ([place])    -  Primary       Reviewed NCCN guidelines for surveillance including physical exam every 3-6 months for 2- 3 years then every 6 months for years 4-5 then annually thereafter. She will be 2 years post completion of treatment in October. We will alternate with Dr. Tasia Catchings who will see her in 3 months and we will see her back in 6 months.   Will arrange for removal of IV port with Dr Lysbeth Galas.    Beckey Rutter, DNP, AGNP-C [place] at [facility] [phone] (clinic)  I personally interviewed and examined the patient. Agreed with the above/below plan of care. I have directly contributed to assessment and plan of care of this patient and educated and discussed with patient and family.  Mellody Drown, MD     CC:  [address] [place],  [place] [address] [phone]   Dr. Tasia Catchings & Dr. Fransisca Connors

## 2020-12-30 NOTE — Progress Notes (Signed)
Gynecologic Oncology Interval Visit   Referring Provider: Dr. Amalia Hailey  Chief Complaint: Endometrial Cancer  Subjective:  Katherine Snyder is a 70 y.o. 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  Doing well with now new significant complaints.   Colonoscopy on 03/13/20 with benign polyps and she will repeat in 5 years. Mammogram 03/04/20 was negative.   Urinary symptoms and pelvic pain last year.  CT scan was normal, but she subsequently passed a kidney stone and felt better.   CT scan 8/21 IMPRESSION: 1. No acute intrathoracic, abdominal, or pelvic pathology. 2. No evidence of metastatic disease in the chest, abdomen, or pelvis. 3. Bilateral pulmonary nodules similar to prior CT. 4. Small nonobstructing right renal interpolar calculus. 5. Colonic diverticulosis. No bowel obstruction. 6. Aortic Atherosclerosis (ICD10-I70.0).   No bleeding or discharge.  Has chronic leg swelling which is no worse.  No abdominal pain, early satiety, or changes in bowel movements.  Has chronic night sweats for decades which are unchanged.  No shortness of breath or chest pain.  GYNECOLOGIC ONCOLOGY HISTORY:  Initially, patient presented to ER on 02/26/18 for PMB. She was reportedly 14 years post menopausal and had had a 2 day episode of vaginal bleeding that was constant.   02/26/18- Ultrasound Pelvic Complete w/ Transvaginal: Endometrium thickness up to 51mm. No focal solid or cystic change. IMPRESSION: Mildly enlarged uterus. No discrete fibroids. Markedly thickened endometrium. In the setting of post-menopausal bleeding, endometrial sampling is indicated to exclude carcinoma. If results are benign, sonohysterogram should be considered for focal lesion work-up. (Ref: Radiological Reasoning: Algorithmic Workup of Abnormal Vaginal Bleeding with  Endovaginal Sonography and Sonohysterography. AJR 2008; 932:I71-24) Nonvisualization of the right ovary. Normal-appearing left ovary. No significant uterine fibroids were observed.  She was seen by Dr. Ellard Artis son 03/01/18 who performed vacurette endometrial biopsy. Uterus was sounded to length to 9 cm. She was started on Aygestin and bleeding stopped.   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  COMMENT: BASED ON INITIAL HISTOLOGIC FINDINGS, IMMUNOHISTOCHEMISTRY IS EVALUATED:  THE SARCOMATOUS TUMOR COMPONENT IS DIFFUSELY POSITIVE FOR CD10 AND THE CARCINOMA COMPONENT IS POSITIVE FOR PANKERATIN, SUPPORTING THE DIAGNOSIS.   She was seen by Dr. Fransisca Connors, Gyn-Onc on 03/28/2018 for initial evaluation and treatment planning.  TLH BSO with sentinel lymph node mapping and biopsies and aortic lymph node dissection were discussed.  Surgery at [facility] was recommended but unfortunately, patient's insurance will not cover.   CA 125- 10.5  03/30/18 - CT C/A/P W CONTRAST IMPRESSION: 1. Mass like expansion of the endometrial cavity known primary endometrial carcinoma. 2. No evidence for nodal metastasis or solid organ metastasis within the abdomen or pelvis. 3. Scattered nonspecific solid nodules and a single part solid nodule identified in both lungs. Consensus criteria recommendations for nodule followup does not apply to patients with known primary malignancy. 4. Liver cysts.  She underwent CT Imaging and then robot assisted total hysterectomy with sentinel LN mapping, biopsies, and aortic LN dissection with Dr. Theora Gianotti at [facility] 04/04/18.   A. UTERUS WITH CERVIX; HYSTERECTOMY:  - CARCINOSARCOMA.  - MYOMETRIAL INVASION PRESENT, LESS THAN HALF OF THE MYOMETRIUM.  - NEGATIVE FOR CERVICAL AND SEROSAL INVOLVEMENT.  - SUBMUCOSAL LEIOMYOMA.   FALLOPIAN TUBE AND OVARY, LEFT; SALPINGO-OOPHORECTOMY:   - NEGATIVE FOR MALIGNANCY.  -  OVARIAN CORTICAL INCLUSION CYSTS.  - ATROPHIC FALLOPIAN TUBE.   B. SENTINEL LYMPH NODE, RIGHT PRIMARY OBTURATOR; EXCISION:  - NEGATIVE FOR MALIGNANCY, ONE LYMPH NODE (0/1).   C. LYMPH NODE, RIGHT EXTERNAL ILIAC VEIN; EXCISION:  - NEGATIVE FOR MALIGNANCY, ONE LYMPH NODE (0/1).   D. FALLOPIAN TUBE AND OVARY, UNILATERAL (RIGHT); SALPINGO-OOPHORECTOMY:  - NEGATIVE FOR MALIGNANCY.  - OVARIAN CORTICAL INCLUSION CYSTS.  - ATROPHIC FALLOPIAN TUBE.   E. SENTINEL LYMPH NODE, RIGHT LOW PARA-AORTIC; EXCISION:  - NEGATIVE FOR MALIGNANCY, TWO LYMPH NODES (0/2).   F. SENTINEL LYMPH NODE, LEFT PRIMARY LOW PARA-AORTIC; EXCISION:  - NEGATIVE FOR MALIGNANCY, ONE LYMPH NODE (0/1).   DIAGNOSIS:  A. PELVIC WASHINGS:  - NEGATIVE FOR MALIGNANCY.    Her case was discussed at [place] and discussed that recurrence risk without adjuvant treatment is up to 50%.  So recommend adjuvant chemo and radiation.  She was seen by Dr. Baruch Gouty, for initial evaluation of stage IA uterine carcinosarcoma and plan is for brachytherapy after 6 cycles of carbo/taxol.    She initiated carbo-Taxol on 04/25/2018.  Completed 6 cycles on 08/08/2018.  She received vaginal brachytherapy with Dr. Baruch Gouty 10/19-11/09/2018.   She had thyroid ultrasound for incidental thyroid nodule seen on surveillance imaging for indeterminate pulmonary nodules on 5/20. Biopsy was benign.   07/11/18- CT Chest WO Contrast for follow-up on pulmonary nodules IMPRESSION: 1. Previously visualized pulmonary nodules are either stable or resolved. Several new subcentimeter bilateral pulmonary nodules. Pulmonary nodularity generally appears centrilobular in distribution and while considered indeterminate is more suggestive of infectious/inflammatory nodularity. Continued chest CT surveillance is recommended in 3-6 months. 2. No thoracic adenopathy.  12/10/2018- CT Chest wo contrast - New sub solid density measuring 12 mm  with associated 3 mm solid nodule is noted in left upper lobe. There also noted several other new nodules noted more posteriorly in the left upper lobe, with the largest measuring 3 mm. These may simply be inflammatory in etiology, but neoplasm can not be excluded. Initial follow-up by chest CT without contrast is recommended in 3 months to confirm persistence. - Stable sub solid density is noted in right upper lobe with grossly stable 4 mm solid nodule. Multiple other pulmonary nodules are noted in both lungs which are stable compared to prior exam - Small nonobstructive right renal calculus - Stable left thyroid nodule - Stable hepatic cyst  She stopped smoking 2/20, but occasionally using vaping products.    CT scan chest 5/20 IMPRESSION: 1. Stable scattered pulmonary nodules as detailed above including a 1.2 cm part solid nodule in the right upper lobe. The solid component remains stable at approximately 0.4 cm. Follow-up non-contrast CT recommended at 3-6 months to confirm persistence. If unchanged, and solid component remains <6 mm, annual CT is recommended until 5 years of stability has been established. If persistent these nodules should be considered highly suspicious if the solid component of the nodule is 6 mm or greater in size and enlarging. This recommendation follows the consensus statement: Guidelines for Management of Incidental Pulmonary Nodules Detected on CT Images: From the Fleischner Society 2017; Radiology 2017; 284:228-243. 2. Additional scattered pulmonary nodules as detailed above measuring up to approximately 8 mm. These are stable from prior study. Attention on yearly follow-up examinations is recommended. 3. Bilateral nonobstructing renal nephroliths are again noted.  4. Mild bilateral interlobular septal thickening consistent with mild volume overload.   Problem List: Patient Active Problem List   Diagnosis Date Noted  . Neuropathy 05/16/2018  .  Fatigue 05/16/2018   . Inflammatory heel pain, right 05/16/2018  . Encounter for antineoplastic chemotherapy 05/16/2018  . Drug-induced neutropenia ([place]) 05/09/2018  . Goals of care, counseling/discussion 04/18/2018  . Endometrial cancer ([place])   . Seborrhea 05/13/2016  . History of kidney stones 06/26/2015  . OP (osteoporosis) 06/26/2015  . Plantar fasciitis 06/26/2015  . Current tobacco use 06/26/2015  . Kidney stone 06/21/2013  . Chronic airway obstruction ([place]) 10/08/2009  . Psoriasis 09/30/2008  . 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 Menopausal symptom 09/21/2007  . Tobacco use 09/21/2007  . Awareness of heartbeats 09/21/2007    Past Medical History: Past Medical History:  Diagnosis Date  . Arthritis   . Cancer [facility])    endometrial  . Endometrial mass   . History of chicken pox   . History of kidney stones   . History of measles   . History of mumps   . Osteoporosis   . Personal history of chemotherapy   . Personal history of radiation therapy   . PMB (postmenopausal bleeding)   . Psoriasis     Past Surgical History: Past Surgical History:  Procedure Laterality Date  . APPENDECTOMY  1960  . BREAST EXCISIONAL BIOPSY Left 1980   neg  . BREAST SURGERY Left    biopsy  . COLONOSCOPY WITH PROPOFOL N/A 03/13/2020   Procedure: COLONOSCOPY WITH PROPOFOL;  Surgeon: Jonathon Bellows, MD;  Location: [facility] ENDOSCOPY;  Service: Gastroenterology;  Laterality: N/A;  . Injection varicose veins in legs Bilateral 2010   Dr. Hulda Humphrey  . LITHOTRIPSY  1994, 2004   for renal stones  . PORTA CATH INSERTION N/A 04/24/2018   Procedure: PORTA CATH INSERTION;  Surgeon: Katha Cabal, MD;  Location: [facility] CV LAB;  Service: Cardiovascular;  Laterality: N/A;  . ROBOTIC ASSISTED TOTAL HYSTERECTOMY Bilateral 04/04/2018   Procedure: ROBOTIC ASSISTED TOTAL HYSTERECTOMY,SENTINEL LYMPH NODE MAPPING AND BIOPSIES,AORTIC LYMPH NODE DISSECTION;  Surgeon: Gillis Ends, MD;  Location:  [facility] ORS;  Service: Gynecology;  Laterality: Bilateral;  . TUBAL LIGATION  1980    OB History:  OB History  Gravida Para Term Preterm AB Living  1 1 1     1   SAB IAB Ectopic Multiple Live Births          1    # Outcome Date GA Lbr Len/2nd Weight Sex Delivery Anes PTL Lv  1 Term 76    M Vag-Spont   LIV  [id number] LMP:   Family History: Family History  Problem Relation Age of Onset  . Breast cancer Mother   . Transient ischemic attack Mother   . Arthritis Mother   . Hypothyroidism Mother   . Lung cancer Mother   . Diabetes Father        type 2  . Hypertension Father   . Arthritis Father   . Colon cancer Sister   . Prostate cancer Brother   . Hyperlipidemia Son   . Stomach cancer Maternal Grandmother   . Stroke Maternal Grandfather     Social History: Social History   Socioeconomic History  . Marital status: Divorced    Spouse name: single  . Number of children: 1  . Years of education: Not on file  . Highest education level: 10th grade  Occupational History  . Occupation: retired  Tobacco Use  . Smoking status: Former Smoker    Packs/day: 0.25    Types: Cigarettes    Quit date: 12/08/2018    Years since quitting: 2.0  .  Smokeless tobacco: Never Used  Vaping Use  . Vaping Use: Some days  Substance and Sexual Activity  . Alcohol use: No    Alcohol/week: 0.0 standard drinks  . Drug use: No  . Sexual activity: Not Currently  Other Topics Concern  . Not on file  Social History Narrative  . Not on file   Social Determinants of Health   Financial Resource Strain: Low Risk   . Difficulty of Paying Living Expenses: Not hard at all  Food Insecurity: No Food Insecurity  . Worried About Charity fundraiser in the Last Year: Never true  . Ran Out of Food in the Last Year: Never true  Transportation Needs: No Transportation Needs  . Lack of Transportation (Medical): No  . Lack of Transportation (Non-Medical): No  Physical Activity: Inactive  . Days of  Exercise per Week: 0 days  . Minutes of Exercise per Session: 0 min  Stress: No Stress Concern Present  . Feeling of Stress : Not at all  Social Connections: Socially Isolated  . Frequency of Communication with Friends and Family: Once a week  . Frequency of Social Gatherings with Friends and Family: More than three times a week  . Attends Religious Services: Never  . Active Member of Clubs or Organizations: No  . Attends Archivist Meetings: Never  . Marital Status: Divorced  Human resources officer Violence: Not At Risk  . Fear of Current or Ex-Partner: No  . Emotionally Abused: No  . Physically Abused: No  . Sexually Abused: No    Allergies: No Known Allergies  Current Medications: Current Outpatient Medications  Medication Sig Dispense Refill  . DULoxetine (CYMBALTA) 30 MG capsule TAKE 1 CAPSULE BY MOUTH EVERY DAY 90 capsule 1  . lidocaine-prilocaine (EMLA) cream Apply to affected area once (Patient taking differently: Apply 1 application topically. Apply to affected area once when flushing port) 30 g 3  . Multiple Vitamins-Minerals (CENTRUM SILVER ADULT 50+ PO) Take 1 tablet by mouth daily.    . naproxen sodium (ALEVE) 220 MG tablet Take 220 mg by mouth daily as needed.    . betamethasone dipropionate (DIPROLENE) 0.05 % cream Apply 1 application topically 2 (two) times daily as needed (for psoriasis on body). (Patient not taking: Reported on 12/30/2020) 30 g 1  . desonide (DESOWEN) 0.05 % cream Apply 1 application topically 2 (two) times daily as needed (for psoriasis on face). (Patient not taking: Reported on 12/30/2020) 30 g 1  . levofloxacin (LEVAQUIN) 500 MG tablet Take 1 tablet (500 mg total) by mouth daily. (Patient not taking: Reported on 12/30/2020) 7 tablet 0  . traMADol (ULTRAM) 50 MG tablet Take 1 tablet (50 mg total) by mouth every 6 (six) hours as needed for moderate pain or severe pain. (Patient not taking: No sig reported) 15 tablet 0   No current  facility-administered medications for this visit.   Facility-Administered Medications Ordered in Other Visits  Medication Dose Route Frequency Provider Last Rate Last Admin  . sodium chloride flush (NS) 0.9 % injection 10 mL  10 mL Intravenous PRN Earlie Server, MD   10 mL at 05/06/20 1305   Review of Systems General:  Night sweats - chronic & unchanged Skin: no complaints Eyes: no complaints HEENT: no complaints Breasts: no complaints Pulmonary: no complaints Cardiac: leg swelling bilaterally - chronic and unchanged.  Gastrointestinal: no complaints Genitourinary/Sexual: dysuria per hpi Ob/Gyn: no complaints Musculoskeletal: no complaints Hematology: no complaints Neurologic/Psych: no complaints   Objective:  Physical  Examination:  Vitals:   12/30/20 1428  BP: 129/65  Pulse: 63  Resp: 20  Temp: 98 F (36.7 C)  SpO2: 100%   Pain 9/10- pelvic-lower   ECOG Performance Status: 1 - Symptomatic but completely ambulatory  GENERAL: Patient is a well appearing female in no acute distress HEENT:  Sclera clear. Anicteric. Negative thyroid exam.  NODES:  Negative axillary, supraclavicular, inguinal lymph node survery LUNGS:  Clear to auscultation bilaterally.   HEART:  Regular rate and rhythm.  ABDOMEN:  Soft, nontender.  No hernias, incisions well healed. No masses or ascites EXTREMITIES:  No peripheral edema. Atraumatic. No cyanosis SKIN:  Clear with no obvious rashes or skin changes.  NEURO:  Nonfocal. Well oriented.  Appropriate affect.  PELVIC: Exam chaperoned by cma. EGBUS: normal, Vagina: atropic with no lesions. No bleeding or discharge. No lesions. Bimanual/RV: normal, no masses.    Assessment:  Katherine Snyder is a 70 y.o. female diagnosed with stage IA uterine carcinosarcoma with RA TLH/BSO, negative staging 5/19 followed by vaginal brachytherapy and 6 cycles of carbo/taxol chemotherapy completed 08/08/2018.  NED on exam today.   Pelvic pain and urinary symptoms refractory  to antibiotics 8/21.  CT scan was normal, but subsequently she passed a kidney stone. No complaints now.  Chest CTs have  shown indeterminate lung nodules, some disapearing over time and some new thought to be benign.  She was a smoker, but stopped in 2/20.    Comorbidities complicating care: smoker, plantar fasciitis  Plan:   Problem List Items Addressed This Visit   None   Visit Diagnoses    Uterine carcinosarcoma ([place])    -  Primary       Reviewed NCCN guidelines for surveillance including physical exam every 3-6 months for 2- 3 years then every 6 months for years 4-5 then annually thereafter. She will be 2 years post completion of treatment in October. We will alternate with Dr. Tasia Catchings who will see her in 3 months and we will see her back in 6 months.   Will arrange for removal of IV port with Dr Lysbeth Galas.    Beckey Rutter, DNP, AGNP-C [place] at [facility] [phone] (clinic)  I personally interviewed and examined the patient. Agreed with the above/below plan of care. I have directly contributed to assessment and plan of care of this patient and educated and discussed with patient and family.  Mellody Drown, MD     CC:  [address] [place],  [place] [address] [phone]   Dr. Tasia Catchings & Dr. Fransisca Connors

## 2021-01-08 ENCOUNTER — Telehealth (INDEPENDENT_AMBULATORY_CARE_PROVIDER_SITE_OTHER): Payer: Self-pay

## 2021-01-08 NOTE — Telephone Encounter (Signed)
Spoke with the patient and she is scheduled with Dr. Lucky Cowboy for a port removal on 01/18/21 with a 11:00 am arrival time to the [facility]. Covid testing on 01/14/21 between 8-1 pm at the [facility]. Pre-procedure instructions were discussed and will be mailed.

## 2021-01-14 ENCOUNTER — Other Ambulatory Visit: Payer: Self-pay

## 2021-01-14 ENCOUNTER — Other Ambulatory Visit
Admission: RE | Admit: 2021-01-14 | Discharge: 2021-01-14 | Disposition: A | Discharge status: Home / Self Care | Payer: Medicare Other | Source: Ambulatory Visit | Attending: Vascular Surgery | Admitting: Vascular Surgery

## 2021-01-14 DIAGNOSIS — Z20822 Contact with and (suspected) exposure to covid-19: Secondary | ICD-10-CM | POA: Diagnosis not present

## 2021-01-14 DIAGNOSIS — Z01812 Encounter for preprocedural laboratory examination: Secondary | ICD-10-CM | POA: Insufficient documentation

## 2021-01-14 LAB — SARS CORONAVIRUS 2 (TAT 6-24 HRS): SARS Coronavirus 2: NEGATIVE

## 2021-01-17 ENCOUNTER — Other Ambulatory Visit (INDEPENDENT_AMBULATORY_CARE_PROVIDER_SITE_OTHER): Payer: Self-pay | Admitting: Nurse Practitioner

## 2021-01-18 ENCOUNTER — Encounter
Admission: RE | Disposition: A | Discharge status: Home / Self Care | Payer: Self-pay | Source: Home / Self Care | Attending: Vascular Surgery

## 2021-01-18 ENCOUNTER — Ambulatory Visit
Admission: RE | Admit: 2021-01-18 | Discharge: 2021-01-18 | Disposition: A | Discharge status: Home / Self Care | Payer: Medicare Other | Attending: Vascular Surgery | Admitting: Vascular Surgery

## 2021-01-18 ENCOUNTER — Encounter: Payer: Self-pay | Admitting: Vascular Surgery

## 2021-01-18 ENCOUNTER — Other Ambulatory Visit: Payer: Self-pay

## 2021-01-18 DIAGNOSIS — Z8542 Personal history of malignant neoplasm of other parts of uterus: Secondary | ICD-10-CM | POA: Diagnosis not present

## 2021-01-18 DIAGNOSIS — C55 Malignant neoplasm of uterus, part unspecified: Secondary | ICD-10-CM

## 2021-01-18 DIAGNOSIS — M722 Plantar fascial fibromatosis: Secondary | ICD-10-CM | POA: Diagnosis not present

## 2021-01-18 DIAGNOSIS — Z452 Encounter for adjustment and management of vascular access device: Secondary | ICD-10-CM | POA: Insufficient documentation

## 2021-01-18 DIAGNOSIS — Z90710 Acquired absence of both cervix and uterus: Secondary | ICD-10-CM | POA: Insufficient documentation

## 2021-01-18 DIAGNOSIS — Z79899 Other long term (current) drug therapy: Secondary | ICD-10-CM | POA: Diagnosis not present

## 2021-01-18 DIAGNOSIS — Z87891 Personal history of nicotine dependence: Secondary | ICD-10-CM | POA: Diagnosis not present

## 2021-01-18 HISTORY — PX: PORTA CATH REMOVAL: CATH118286

## 2021-01-18 SURGERY — PORTA CATH REMOVAL
Anesthesia: Moderate Sedation

## 2021-01-18 MED ORDER — METHYLPREDNISOLONE SODIUM SUCC 125 MG IJ SOLR: 125.0000 mg | Freq: Once | INTRAMUSCULAR | Status: DC | PRN

## 2021-01-18 MED ORDER — DIPHENHYDRAMINE HCL 50 MG/ML IJ SOLN: 50.0000 mg | Freq: Once | INTRAMUSCULAR | Status: DC | PRN

## 2021-01-18 MED ORDER — FENTANYL CITRATE (PF) 100 MCG/2ML IJ SOLN
INTRAMUSCULAR | Status: DC | PRN
  Administered 2021-01-18: 50 ug via INTRAVENOUS

## 2021-01-18 MED ORDER — FAMOTIDINE 20 MG PO TABS: 40.0000 mg | ORAL_TABLET | Freq: Once | ORAL | Status: DC | PRN

## 2021-01-18 MED ORDER — FENTANYL CITRATE (PF) 100 MCG/2ML IJ SOLN
INTRAMUSCULAR | Status: AC
  Filled 2021-01-18: qty 2

## 2021-01-18 MED ORDER — MIDAZOLAM HCL 2 MG/ML PO SYRP: 8.0000 mg | ORAL_SOLUTION | Freq: Once | ORAL | Status: DC | PRN

## 2021-01-18 MED ORDER — ONDANSETRON HCL 4 MG/2ML IJ SOLN: 4.0000 mg | Freq: Four times a day (QID) | INTRAMUSCULAR | Status: DC | PRN

## 2021-01-18 MED ORDER — CEFAZOLIN SODIUM-DEXTROSE 2-4 GM/100ML-% IV SOLN
INTRAVENOUS | Status: AC
  Administered 2021-01-18: 2 g via INTRAVENOUS
  Filled 2021-01-18: qty 100

## 2021-01-18 MED ORDER — HYDROMORPHONE HCL 1 MG/ML IJ SOLN
1.0000 mg | Freq: Once | INTRAMUSCULAR | Status: DC | PRN
Start: 2021-01-18 — End: 2021-01-18

## 2021-01-18 MED ORDER — MIDAZOLAM HCL 5 MG/5ML IJ SOLN
INTRAMUSCULAR | Status: AC
  Filled 2021-01-18: qty 5

## 2021-01-18 MED ORDER — SODIUM CHLORIDE 0.9 % IV SOLN: INTRAVENOUS | Status: DC

## 2021-01-18 MED ORDER — CEFAZOLIN SODIUM-DEXTROSE 2-4 GM/100ML-% IV SOLN: 2.0000 g | Freq: Once | INTRAVENOUS | Status: AC

## 2021-01-18 MED ORDER — MIDAZOLAM HCL 2 MG/2ML IJ SOLN
INTRAMUSCULAR | Status: DC | PRN
  Administered 2021-01-18: 2 mg via INTRAVENOUS

## 2021-01-18 SURGICAL SUPPLY — 7 items
DERMABOND ADVANCED (GAUZE/BANDAGES/DRESSINGS) ×1
DERMABOND ADVANCED .7 DNX12 (GAUZE/BANDAGES/DRESSINGS) ×1 IMPLANT
PACK ANGIOGRAPHY (CUSTOM PROCEDURE TRAY) ×2 IMPLANT
PENCIL ELECTRO HAND CTR (MISCELLANEOUS) ×2 IMPLANT
SUT MNCRL AB 4-0 PS2 18 (SUTURE) ×2 IMPLANT
SUT VIC AB 3-0 SH 27 (SUTURE) ×1
SUT VIC AB 3-0 SH 27X BRD (SUTURE) ×1 IMPLANT

## 2021-01-18 NOTE — Interval H&P Note (Signed)
History and Physical Interval Note:  01/18/2021 10:36 AM  Katherine Snyder  has presented today for surgery, with the diagnosis of Port Removal   Uterine Carcinosarcoma   Pt to have Covid test on 3-8.  The various methods of treatment have been discussed with the patient and family. After consideration of risks, benefits and other options for treatment, the patient has consented to  Procedure(s): PORTA CATH REMOVAL (N/A) as a surgical intervention.  The patient's history has been reviewed, patient examined, no change in status, stable for surgery.  I have reviewed the patient's chart and labs.  Questions were answered to the patient's satisfaction.     Leotis Pain

## 2021-01-18 NOTE — Op Note (Signed)
[facility]       Operative Note  Date: 01/18/2021  Preoperative diagnosis:  1. Uterine cancer, no longer using port  Postoperative diagnosis:  Same as above  Procedures: #1. Removal of right jugular port a cath   Surgeon: Leotis Pain, MD  Anesthesia: Local with moderate conscious sedation for 13 minutes using 2 mg of Versed and 50 mcg of Fentanyl  Fluoroscopy time: none  Contrast used: 0  Estimated blood loss: Minimal  Indication for the procedure:  The patient is a 70 y.o. female who has completed her treatment of uterine cancer and no longer needs their Port-A-Cath. The patient desires to have this removed. Risks and benefits including need for potential replacement with recurrent disease were discussed and patient is agreeable to proceed.  Description of procedure: The patient was brought to the vascular and interventional radiology suite. Moderate conscious sedation was administered during a face to face encounter with the patient throughout the procedure with my supervision of the RN administering medicines and monitoring the patient's vital signs, pulse oximetry, telemetry and mental status throughout from the start of the procedure until the patient was taken to the recovery room.  The right neck chest and shoulder were sterilely prepped and draped, and a sterile surgical field was created. The area was then anesthetized with 1% lidocaine copiously. The previous incision was reopened and electrocautery used to dissected down to the port and the catheter. These were dissected free and the catheter was gently removed from the vein in its entirety. The port was dissected out from the fibrous connective tissue and the Prolene sutures were removed. The port was then removed in its entirety including the catheter. The wound was then closed with a 3-0 Vicryl and a 4-0 Monocryl and Dermabond was placed as a dressing. The patient  was then taken to the recovery room in stable condition having tolerated the procedure well.  Complications: none  Condition: stable   Leotis Pain, MD 01/18/2021 12:33 PM   This note was created with Dragon Medical transcription system. Any errors in dictation are purely unintentional.

## 2021-01-18 NOTE — Discharge Instructions (Signed)
Moderate Conscious Sedation, Adult, Care After This sheet gives you information about how to care for yourself after your procedure. Your health care provider may also give you more specific instructions. If you have problems or questions, contact your health care provider. What can I expect after the procedure? After the procedure, it is common to have:  Sleepiness for several hours.  Impaired judgment for several hours.  Difficulty with balance.  Vomiting if you eat too soon. Follow these instructions at home: For the time period you were told by your health care provider:  Rest.  Do not participate in activities where you could fall or become injured.  Do not drive or use machinery.  Do not drink alcohol.  Do not take sleeping pills or medicines that cause drowsiness.  Do not make important decisions or sign legal documents.  Do not take care of children on your own.      Eating and drinking  Follow the diet recommended by your health care provider.  Drink enough fluid to keep your urine pale yellow.  If you vomit: ? Drink water, juice, or soup when you can drink without vomiting. ? Make sure you have little or no nausea before eating solid foods.   General instructions  Take over-the-counter and prescription medicines only as told by your health care provider.  Have a responsible adult stay with you for the time you are told. It is important to have someone help care for you until you are awake and alert.  Do not smoke.  Keep all follow-up visits as told by your health care provider. This is important. Contact a health care provider if:  You are still sleepy or having trouble with balance after 24 hours.  You feel light-headed.  You keep feeling nauseous or you keep vomiting.  You develop a rash.  You have a fever.  You have redness or swelling around the IV site. Get help right away if:  You have trouble breathing.  You have new-onset confusion at  home. Summary  After the procedure, it is common to feel sleepy, have impaired judgment, or feel nauseous if you eat too soon.  Rest after you get home. Know the things you should not do after the procedure.  Follow the diet recommended by your health care provider and drink enough fluid to keep your urine pale yellow.  Get help right away if you have trouble breathing or new-onset confusion at home. This information is not intended to replace advice given to you by your health care provider. Make sure you discuss any questions you have with your health care provider. Document Revised: 02/21/2020 Document Reviewed: 09/19/2019 Elsevier Patient Education  2021 [employer] Implanted Port Removal, Care After This sheet gives you information about how to care for yourself after your procedure. Your health care provider may also give you more specific instructions. If you have problems or questions, contact your health care provider. What can I expect after the procedure? After the procedure, it is common to have:  Soreness or pain near your incision.  Some swelling or bruising near your incision. Follow these instructions at home: Medicines  Take over-the-counter and prescription medicines only as told by your health care provider.  If you were prescribed an antibiotic medicine, take it as told by your health care provider. Do not stop taking the antibiotic even if you start to feel better. Bathing  Do not take baths, swim, or use a hot tub until your health care   provider approves. Ask your health care provider if you can take showers. You may only be allowed to take sponge baths. Incision care  Follow instructions from your health care provider about how to take care of your incision. Make sure you: ? Wash your hands with soap and water before you change your bandage (dressing). If soap and water are not available, use hand sanitizer. ? Change your dressing as told by your health  care provider. ? Keep your dressing dry. ? Leave stitches (sutures), skin glue, or adhesive strips in place. These skin closures may need to stay in place for 2 weeks or longer. If adhesive strip edges start to loosen and curl up, you may trim the loose edges. Do not remove adhesive strips completely unless your health care provider tells you to do that.  Check your incision area every day for signs of infection. Check for: ? More redness, swelling, or pain. ? More fluid or blood. ? Warmth. ? Pus or a bad smell.   Driving  Do not drive for 24 hours if you were given a medicine to help you relax (sedative) during your procedure.  If you did not receive a sedative, ask your health care provider when it is safe to drive.   Activity  Return to your normal activities as told by your health care provider. Ask your health care provider what activities are safe for you.  Do not lift anything that is heavier than 10 lb (4.5 kg), or the limit that you are told, until your health care provider says that it is safe.  Do not do activities that involve lifting your arms over your head. General instructions  Do not use any products that contain nicotine or tobacco, such as cigarettes and e-cigarettes. These can delay healing. If you need help quitting, ask your health care provider.  Keep all follow-up visits as told by your health care provider. This is important. Contact a health care provider if:  You have more redness, swelling, or pain around your incision.  You have more fluid or blood coming from your incision.  Your incision feels warm to the touch.  You have pus or a bad smell coming from your incision.  You have pain that is not relieved by your pain medicine. Get help right away if you have:  A fever or chills.  Chest pain.  Difficulty breathing. Summary  After the procedure, it is common to have pain, soreness, swelling, or bruising near your incision.  If you were  prescribed an antibiotic medicine, take it as told by your health care provider. Do not stop taking the antibiotic even if you start to feel better.  Do not drive for 24 hours if you were given a sedative during your procedure.  Return to your normal activities as told by your health care provider. Ask your health care provider what activities are safe for you. This information is not intended to replace advice given to you by your health care provider. Make sure you discuss any questions you have with your health care provider. Document Revised: 12/07/2017 Document Reviewed: 12/07/2017 Elsevier Patient Education  2021 [employer]  

## 2021-02-01 ENCOUNTER — Other Ambulatory Visit: Payer: Medicare Other

## 2021-02-25 ENCOUNTER — Encounter: Payer: Medicare Other | Admitting: Physician Assistant

## 2021-02-26 ENCOUNTER — Ambulatory Visit: Payer: Medicare Other | Admitting: Family Medicine

## 2021-03-01 NOTE — Patient Instructions (Signed)
Preventive Care 68 Years and Older, Female Preventive care refers to lifestyle choices and visits with your health care provider that can promote health and wellness. This includes:  A yearly physical exam. This is also called an annual wellness visit.  Regular dental and eye exams.  Immunizations.  Screening for certain conditions.  Healthy lifestyle choices, such as: ? Eating a healthy diet. ? Getting regular exercise. ? Not using drugs or products that contain nicotine and tobacco. ? Limiting alcohol use. What can I expect for my preventive care visit? Physical exam Your health care provider will check your:  Height and weight. These may be used to calculate your BMI (body mass index). BMI is a measurement that tells if you are at a healthy weight.  Heart rate and blood pressure.  Body temperature.  Skin for abnormal spots. Counseling Your health care provider may ask you questions about your:  Past medical problems.  Family's medical history.  Alcohol, tobacco, and drug use.  Emotional well-being.  Home life and relationship well-being.  Sexual activity.  Diet, exercise, and sleep habits.  History of falls.  Memory and ability to understand (cognition).  Work and work Statistician.  Pregnancy and menstrual history.  Access to firearms. What immunizations do I need? Vaccines are usually given at various ages, according to a schedule. Your health care provider will recommend vaccines for you based on your age, medical history, and lifestyle or other factors, such as travel or where you work.   What tests do I need? Blood tests  Lipid and cholesterol levels. These may be checked every 5 years, or more often depending on your overall health.  Hepatitis C test.  Hepatitis B test. Screening  Lung cancer screening. You may have this screening every year starting at age 68 if you have a 30-pack-year history of smoking and currently smoke or have quit within  the past 15 years.  Colorectal cancer screening. ? All adults should have this screening starting at age 68 and continuing until age 68. ? Your health care provider may recommend screening at age 68 if you are at increased risk. ? You will have tests every 1-10 years, depending on your results and the type of screening test.  Diabetes screening. ? This is done by checking your blood sugar (glucose) after you have not eaten for a while (fasting). ? You may have this done every 1-3 years.  Mammogram. ? This may be done every 1-2 years. ? Talk with your health care provider about how often you should have regular mammograms.  Abdominal aortic aneurysm (AAA) screening. You may need this if you are a current or former smoker.  BRCA-related cancer screening. This may be done if you have a family history of breast, ovarian, tubal, or peritoneal cancers. Other tests  STD (sexually transmitted disease) testing, if you are at risk.  Bone density scan. This is done to screen for osteoporosis. You may have this done starting at age 68. Talk with your health care provider about your test results, treatment options, and if necessary, the need for more tests. Follow these instructions at home: Eating and drinking  Eat a diet that includes fresh fruits and vegetables, whole grains, lean protein, and low-fat dairy products. Limit your intake of foods with high amounts of sugar, saturated fats, and salt.  Take vitamin and mineral supplements as recommended by your health care provider.  Do not drink alcohol if your health care provider tells you not to drink.  If you drink alcohol: ? Limit how much you have to 0-1 drink a day. ? Be aware of how much alcohol is in your drink. In the [place] one drink equals one 12 oz bottle of beer (355 mL), one 5 oz glass of wine (148 mL), or one 1 oz glass of hard liquor (44 mL).   Lifestyle  Take daily care of your teeth and gums. Brush your teeth every morning  and night with fluoride toothpaste. Floss one time each day.  Stay active. Exercise for at least 30 minutes 5 or more days each week.  Do not use any products that contain nicotine or tobacco, such as cigarettes, e-cigarettes, and chewing tobacco. If you need help quitting, ask your health care provider.  Do not use drugs.  If you are sexually active, practice safe sex. Use a condom or other form of protection in order to prevent STIs (sexually transmitted infections).  Talk with your health care provider about taking a low-dose aspirin or statin.  Find healthy ways to cope with stress, such as: ? Meditation, yoga, or listening to music. ? Journaling. ? Talking to a trusted person. ? Spending time with friends and family. Safety  Always wear your seat belt while driving or riding in a vehicle.  Do not drive: ? If you have been drinking alcohol. Do not ride with someone who has been drinking. ? When you are tired or distracted. ? While texting.  Wear a helmet and other protective equipment during sports activities.  If you have firearms in your house, make sure you follow all gun safety procedures. What's next?  Visit your health care provider once a year for an annual wellness visit.  Ask your health care provider how often you should have your eyes and teeth checked.  Stay up to date on all vaccines. This information is not intended to replace advice given to you by your health care provider. Make sure you discuss any questions you have with your health care provider. Document Revised: 10/14/2020 Document Reviewed: 10/18/2018 Elsevier Patient Education  2021 [employer]  

## 2021-03-01 NOTE — Progress Notes (Addendum)
Complete physical exam   Patient: Katherine Snyder   DOB: 1951/02/11   69 y.o. Female  MRN: [id number] Visit Date: 03/02/2021  Today's healthcare provider: Laurita Quint Sanya Kobrin, FNP   Chief Complaint  Patient presents with  . Annual Exam   Subjective    Katherine Snyder is a 70 y.o. female who presents today for a complete physical exam.  She reports consuming a general diet. The patient has a physically strenuous job, but has no regular exercise apart from work.  She generally feels fairly well. She reports sleeping well. She does have additional problems to discuss today.  Urinary Tract Infection  This is a new problem. The current episode started yesterday. Associated symptoms include frequency and urgency. Pertinent negatives include no chills, discharge, flank pain, hematuria, hesitancy or sweats. She has tried home medications for the symptoms. The treatment provided mild relief.    12/09/20 AWV  03/04/20 Mammogram-BI-RADS 1 12/09/14 BMD-Osteoporosis 03/13/20 Colonoscopy-diverticulosis, polyps 03/13/20 Pathology-precancerous, repeat in 5 years  Dentist: has dentures, hasn't followed up Denies any issues with pain or fit Eye doctor: doesn't go  Had her port removed last month Denies any other surgeries Denies any ED or UC visits in the past year  Past Medical History:  Diagnosis Date  . Arthritis   . Cancer [facility])    endometrial  . Endometrial mass   . History of chicken pox   . History of kidney stones   . History of measles   . History of mumps   . Osteoporosis   . Personal history of chemotherapy   . Personal history of radiation therapy   . PMB (postmenopausal bleeding)   . Psoriasis    Past Surgical History:  Procedure Laterality Date  . APPENDECTOMY  1960  . BREAST EXCISIONAL BIOPSY Left 1980   neg  . BREAST SURGERY Left    biopsy  . COLONOSCOPY WITH PROPOFOL N/A 03/13/2020   Procedure: COLONOSCOPY WITH PROPOFOL;  Surgeon: Jonathon Bellows, MD;  Location: [facility]  ENDOSCOPY;  Service: Gastroenterology;  Laterality: N/A;  . Injection varicose veins in legs Bilateral 2010   Dr. Hulda Humphrey  . LITHOTRIPSY  1994, 2004   for renal stones  . PORTA CATH INSERTION N/A 04/24/2018   Procedure: PORTA CATH INSERTION;  Surgeon: Katha Cabal, MD;  Location: [place] CV LAB;  Service: Cardiovascular;  Laterality: N/A;  . PORTA CATH REMOVAL N/A 01/18/2021   Procedure: PORTA CATH REMOVAL;  Surgeon: Algernon Huxley, MD;  Location: [place] CV LAB;  Service: Cardiovascular;  Laterality: N/A;  . ROBOTIC ASSISTED TOTAL HYSTERECTOMY Bilateral 04/04/2018   Procedure: ROBOTIC ASSISTED TOTAL HYSTERECTOMY,SENTINEL LYMPH NODE MAPPING AND BIOPSIES,AORTIC LYMPH NODE DISSECTION;  Surgeon: Gillis Ends, MD;  Location: [facility] ORS;  Service: Gynecology;  Laterality: Bilateral;  . TUBAL LIGATION  1980   Social History   Socioeconomic History  . Marital status: Divorced    Spouse name: single  . Number of children: 1  . Years of education: Not on file  . Highest education level: 10th grade  Occupational History  . Occupation: retired  Tobacco Use  . Smoking status: Former Smoker    Packs/day: 0.25    Types: Cigarettes    Quit date: 12/08/2018    Years since quitting: 2.2  . Smokeless tobacco: Never Used  Vaping Use  . Vaping Use: Some days  Substance and Sexual Activity  . Alcohol use: No    Alcohol/week: 0.0 standard drinks  . Drug  use: No  . Sexual activity: Not Currently  Other Topics Concern  . Not on file  Social History Narrative  . Not on file   Social Determinants of Health   Financial Resource Strain: Low Risk   . Difficulty of Paying Living Expenses: Not hard at all  Food Insecurity: No Food Insecurity  . Worried About Charity fundraiser in the Last Year: Never true  . Ran Out of Food in the Last Year: Never true  Transportation Needs: No Transportation Needs  . Lack of Transportation (Medical): No  . Lack of Transportation (Non-Medical):  No  Physical Activity: Inactive  . Days of Exercise per Week: 0 days  . Minutes of Exercise per Session: 0 min  Stress: No Stress Concern Present  . Feeling of Stress : Not at all  Social Connections: Socially Isolated  . Frequency of Communication with Friends and Family: Once a week  . Frequency of Social Gatherings with Friends and Family: More than three times a week  . Attends Religious Services: Never  . Active Member of Clubs or Organizations: No  . Attends Archivist Meetings: Never  . Marital Status: Divorced  Human resources officer Violence: Not At Risk  . Fear of Current or Ex-Partner: No  . Emotionally Abused: No  . Physically Abused: No  . Sexually Abused: No   Family Status  Relation Name Status  . Mother  Deceased at age 91       died from Metastatic cancer. Gallblader cancer  . Father  Alive  . Sister  Alive  . Brother  Alive  . Son  Alive  . MGM  Deceased  . MGF  Deceased   Family History  Problem Relation Age of Onset  . Breast cancer Mother   . Transient ischemic attack Mother   . Arthritis Mother   . Hypothyroidism Mother   . Lung cancer Mother   . Diabetes Father        type 2  . Hypertension Father   . Arthritis Father   . Colon cancer Sister   . Prostate cancer Brother   . Hyperlipidemia Son   . Stomach cancer Maternal Grandmother   . Stroke Maternal Grandfather    No Known Allergies  Patient Care Team: Paulene Floor as PCP - General (Physician Assistant) Mellody Drown, MD as Referring Physician (Obstetrics and Gynecology) Clent Jacks, RN as Oncology Nurse Navigator Noreene Filbert, MD as Referring Physician (Radiation Oncology) Earlie Server, MD as Consulting Physician (Oncology) Pa, [place] (Optometry)   Medications: Outpatient Medications Prior to Visit  Medication Sig  . DULoxetine (CYMBALTA) 30 MG capsule TAKE 1 CAPSULE BY MOUTH EVERY DAY  . Multiple Vitamins-Minerals (CENTRUM SILVER ADULT 50+ PO) Take  1 tablet by mouth daily.  . naproxen sodium (ALEVE) 220 MG tablet Take 220 mg by mouth daily as needed.  . [DISCONTINUED] betamethasone dipropionate (DIPROLENE) 0.05 % cream Apply 1 application topically 2 (two) times daily as needed (for psoriasis on body).  . [DISCONTINUED] desonide (DESOWEN) 0.05 % cream Apply 1 application topically 2 (two) times daily as needed (for psoriasis on face).  . [DISCONTINUED] levofloxacin (LEVAQUIN) 500 MG tablet Take 1 tablet (500 mg total) by mouth daily.  . [DISCONTINUED] lidocaine-prilocaine (EMLA) cream Apply to affected area once (Patient taking differently: Apply 1 application topically. Apply to affected area once when flushing port)  . [DISCONTINUED] traMADol (ULTRAM) 50 MG tablet Take 1 tablet (50 mg total) by mouth every  6 (six) hours as needed for moderate pain or severe pain.  . [DISCONTINUED] sodium chloride flush (NS) 0.9 % injection 10 mL    No facility-administered medications prior to visit.    Review of Systems  Constitutional: Negative for chills.  Genitourinary: Positive for frequency and urgency. Negative for flank pain, hematuria and hesitancy.    Last CBC Lab Results  Component Value Date   WBC 6.3 09/22/2020   HGB 13.2 09/22/2020   HCT 39.2 09/22/2020   MCV 88.7 09/22/2020   MCH 29.9 09/22/2020   RDW 12.8 09/22/2020   PLT 233 61/60/7371   Last metabolic panel Lab Results  Component Value Date   GLUCOSE 89 09/22/2020   NA 139 09/22/2020   K 4.3 09/22/2020   CL 103 09/22/2020   CO2 26 09/22/2020   BUN 20 09/22/2020   CREATININE 0.92 09/22/2020   GFRNONAA >60 09/22/2020   GFRAA >60 04/13/2020   CALCIUM 9.0 09/22/2020   PROT 7.1 09/22/2020   ALBUMIN 3.8 09/22/2020   LABGLOB 2.5 02/25/2020   AGRATIO 1.8 02/25/2020   BILITOT 0.5 09/22/2020   ALKPHOS 64 09/22/2020   AST 21 09/22/2020   ALT 18 09/22/2020   ANIONGAP 10 09/22/2020   Last lipids Lab Results  Component Value Date   CHOL 152 02/25/2020   HDL 51  02/25/2020   LDLCALC 86 02/25/2020   TRIG 76 02/25/2020   CHOLHDL 3.0 02/25/2020   Last hemoglobin A1c Lab Results  Component Value Date   HGBA1C 5.5 02/25/2020   Last thyroid functions Lab Results  Component Value Date   TSH 1.340 02/25/2020   T4TOTAL 8.9 02/25/2020   Last vitamin D Lab Results  Component Value Date   VD25OH 34.4 05/13/2016      Objective    BP 123/83 (BP Location: Left Arm, Patient Position: Sitting, Cuff Size: Normal)   Pulse 76   Temp 98.4 F (36.9 C) (Oral)   Ht 5\' 7"  (1.702 m)   Wt 178 lb (80.7 kg)   BMI 27.88 kg/m  BP Readings from Last 3 Encounters:  03/02/21 123/83  01/18/21 128/67  12/30/20 129/65   Wt Readings from Last 3 Encounters:  03/02/21 178 lb (80.7 kg)  01/18/21 183 lb (83 kg)  12/30/20 183 lb 11.2 oz (83.3 kg)      Physical Exam Vitals reviewed.  Constitutional:      Appearance: Normal appearance.  HENT:     Right Ear: Tympanic membrane and ear canal normal. There is no impacted cerumen (Moderate amount of wax).     Left Ear: Tympanic membrane and ear canal normal. There is no impacted cerumen (Moderate amount of wax present).     Nose: Nose normal.     Mouth/Throat:     Mouth: Mucous membranes are moist.     Pharynx: Oropharynx is clear. No oropharyngeal exudate or posterior oropharyngeal erythema.  Eyes:     Extraocular Movements: Extraocular movements intact.     Pupils: Pupils are equal, round, and reactive to light.  Neck:     Thyroid: No thyroid mass, thyromegaly or thyroid tenderness.  Cardiovascular:     Rate and Rhythm: Normal rate and regular rhythm.     Pulses: Normal pulses.     Heart sounds: Normal heart sounds. No murmur heard. No friction rub. No gallop.   Pulmonary:     Effort: Pulmonary effort is normal. No respiratory distress.     Breath sounds: Normal breath sounds. No stridor. No wheezing or rales.  Abdominal:     General: Bowel sounds are normal. There is no distension.     Palpations:  Abdomen is soft.     Tenderness: There is no abdominal tenderness.  Musculoskeletal:        General: Normal range of motion.     Cervical back: Normal range of motion.  Lymphadenopathy:     Cervical: No cervical adenopathy.  Skin:    General: Skin is warm and dry.     Capillary Refill: Capillary refill takes less than 2 seconds.  Neurological:     General: No focal deficit present.     Mental Status: She is alert and oriented to person, place, and time.     Motor: No weakness.     Coordination: Coordination normal.     Gait: Gait normal.  Psychiatric:        Mood and Affect: Mood normal.        Behavior: Behavior normal.       Last depression screening scores PHQ 2/9 Scores 12/09/2020 02/25/2020 01/28/2019  PHQ - 2 Score 0 0 0  PHQ- 9 Score - - 0   Last fall risk screening Fall Risk  12/09/2020  Falls in the past year? 0  Number falls in past yr: 0  Injury with Fall? 0   Last Audit-C alcohol use screening Alcohol Use Disorder Test (AUDIT) 12/09/2020  1. How often do you have a drink containing alcohol? 0  2. How many drinks containing alcohol do you have on a typical day when you are drinking? 0  3. How often do you have six or more drinks on one occasion? 0  AUDIT-C Score 0  Alcohol Brief Interventions/Follow-up AUDIT Score <7 follow-up not indicated   A score of 3 or more in women, and 4 or more in men indicates increased risk for alcohol abuse, EXCEPT if all of the points are from question 1   Results for orders placed or performed in visit on 03/02/21  POCT urinalysis dipstick  Result Value Ref Range   Color, UA     Clarity, UA     Glucose, UA Negative Negative   Bilirubin, UA Negative    Ketones, UA Negative    Spec Grav, UA 1.025 1.010 - 1.025   Blood, UA Trace    pH, UA 6.0 5.0 - 8.0   Protein, UA     Urobilinogen, UA     Nitrite, UA     Leukocytes, UA Small (1+) (A) Negative   Appearance     Odor      Assessment & Plan    Routine Health Maintenance and  Physical Exam  Exercise Activities and Dietary recommendations Goals    . DIET - INCREASE WATER INTAKE     Recommend to drink at least 6-8 8oz glasses of water per day.       Immunization History  Administered Date(s) Administered  . Influenza, High Dose Seasonal PF 08/03/2019, 08/07/2020  . Influenza-Unspecified 08/16/2016  . Moderna Sars-Covid-2 Vaccination 07/20/2020, 08/17/2020, 02/10/2021  . Pneumococcal Conjugate-13 01/28/2019  . Pneumococcal Polysaccharide-23 10/08/2009, 12/09/2020  . Td 01/28/2019  . Tdap 10/08/2009    Health Maintenance  Topic Date Due  . DEXA SCAN  12/09/2016  . INFLUENZA VACCINE  06/07/2021  . COVID-19 Vaccine (4 - Booster for Moderna series) 08/12/2021  . MAMMOGRAM  03/04/2022  . COLONOSCOPY (Pts 45-16yrs Insurance coverage will need to be confirmed)  03/13/2025  . TETANUS/TDAP  01/27/2029  . Hepatitis C Screening  Completed  . PNA vac Low Risk Adult  Completed  . HPV VACCINES  Aged Out    Discussed health benefits of physical activity, and encouraged her to engage in regular exercise appropriate for her age and condition.  Problem List Items Addressed This Visit      Musculoskeletal and Integument   Psoriasis   Relevant Medications   betamethasone dipropionate 0.05 % cream   desonide (DESOWEN) 0.05 % cream    Other Visit Diagnoses    Annual physical exam    -  Primary   Relevant Orders   CBC with Differential/Platelet   Comprehensive metabolic panel   Lipid Panel With LDL/HDL Ratio   TSH   Encounter for screening mammogram for malignant neoplasm of breast       Relevant Orders   MM 3D SCREEN BREAST BILATERAL   Thyroid nodule       Relevant Orders   CBC with Differential/Platelet   TSH   Weight gain       Relevant Orders   Comprehensive metabolic panel   Lipid Panel With LDL/HDL Ratio   TSH   Cystitis       Relevant Medications   nitrofurantoin, macrocrystal-monohydrate, (MACROBID) 100 MG capsule   Other Relevant Orders    POCT urinalysis dipstick (Completed)   CULTURE, URINE COMPREHENSIVE   Urinalysis, microscopic only       Return in about 6 months (around 09/01/2021).      [place], [place] [phone] (phone) [phone] (fax)  [place]

## 2021-03-02 ENCOUNTER — Other Ambulatory Visit: Payer: Self-pay

## 2021-03-02 ENCOUNTER — Ambulatory Visit (INDEPENDENT_AMBULATORY_CARE_PROVIDER_SITE_OTHER): Payer: Medicare Other | Admitting: Family Medicine

## 2021-03-02 ENCOUNTER — Encounter: Payer: Self-pay | Admitting: Family Medicine

## 2021-03-02 ENCOUNTER — Other Ambulatory Visit: Payer: Self-pay | Admitting: Family Medicine

## 2021-03-02 VITALS — BP 123/83 | HR 76 | Temp 98.4°F | Ht 67.0 in | Wt 178.0 lb

## 2021-03-02 DIAGNOSIS — E041 Nontoxic single thyroid nodule: Secondary | ICD-10-CM | POA: Diagnosis not present

## 2021-03-02 DIAGNOSIS — Z1231 Encounter for screening mammogram for malignant neoplasm of breast: Secondary | ICD-10-CM | POA: Diagnosis not present

## 2021-03-02 DIAGNOSIS — R635 Abnormal weight gain: Secondary | ICD-10-CM | POA: Diagnosis not present

## 2021-03-02 DIAGNOSIS — L409 Psoriasis, unspecified: Secondary | ICD-10-CM

## 2021-03-02 DIAGNOSIS — N3090 Cystitis, unspecified without hematuria: Secondary | ICD-10-CM

## 2021-03-02 DIAGNOSIS — Z0000 Encounter for general adult medical examination without abnormal findings: Secondary | ICD-10-CM | POA: Diagnosis not present

## 2021-03-02 LAB — POCT URINALYSIS DIPSTICK
Bilirubin, UA: NEGATIVE
Glucose, UA: NEGATIVE
Ketones, UA: NEGATIVE
Spec Grav, UA: 1.025 (ref 1.010–1.025)
pH, UA: 6 (ref 5.0–8.0)

## 2021-03-02 MED ORDER — NITROFURANTOIN MONOHYD MACRO 100 MG PO CAPS: 100.0000 mg | ORAL_CAPSULE | Freq: Two times a day (BID) | ORAL | 0 refills | Status: AC

## 2021-03-02 MED ORDER — BETAMETHASONE DIPROPIONATE 0.05 % EX CREA: 1.0000 "application " | TOPICAL_CREAM | Freq: Two times a day (BID) | CUTANEOUS | 1 refills | Status: DC | PRN

## 2021-03-02 MED ORDER — DESONIDE 0.05 % EX CREA: 1.0000 "application " | TOPICAL_CREAM | Freq: Two times a day (BID) | CUTANEOUS | 1 refills | Status: DC | PRN

## 2021-03-03 LAB — CBC WITH DIFFERENTIAL/PLATELET
Basophils Absolute: 0.1 10*3/uL (ref 0.0–0.2)
Basos: 1 %
EOS (ABSOLUTE): 0.1 10*3/uL (ref 0.0–0.4)
Eos: 2 %
Hematocrit: 39.7 % (ref 34.0–46.6)
Hemoglobin: 13.1 g/dL (ref 11.1–15.9)
Immature Grans (Abs): 0 10*3/uL (ref 0.0–0.1)
Immature Granulocytes: 0 %
Lymphocytes Absolute: 1.7 10*3/uL (ref 0.7–3.1)
Lymphs: 30 %
MCH: 29.4 pg (ref 26.6–33.0)
MCHC: 33 g/dL (ref 31.5–35.7)
MCV: 89 fL (ref 79–97)
Monocytes Absolute: 0.5 10*3/uL (ref 0.1–0.9)
Monocytes: 9 %
Neutrophils Absolute: 3.2 10*3/uL (ref 1.4–7.0)
Neutrophils: 58 %
Platelets: 214 10*3/uL (ref 150–450)
RBC: 4.45 x10E6/uL (ref 3.77–5.28)
RDW: 12.3 % (ref 11.7–15.4)
WBC: 5.6 10*3/uL (ref 3.4–10.8)

## 2021-03-03 LAB — COMPREHENSIVE METABOLIC PANEL
ALT: 11 IU/L (ref 0–32)
AST: 14 IU/L (ref 0–40)
Albumin/Globulin Ratio: 1.9 (ref 1.2–2.2)
Albumin: 4.3 g/dL (ref 3.8–4.8)
Alkaline Phosphatase: 70 IU/L (ref 44–121)
BUN/Creatinine Ratio: 24 (ref 12–28)
BUN: 18 mg/dL (ref 8–27)
Bilirubin Total: 0.4 mg/dL (ref 0.0–1.2)
CO2: 21 mmol/L (ref 20–29)
Calcium: 9.2 mg/dL (ref 8.7–10.3)
Chloride: 103 mmol/L (ref 96–106)
Creatinine, Ser: 0.76 mg/dL (ref 0.57–1.00)
Globulin, Total: 2.3 g/dL (ref 1.5–4.5)
Glucose: 92 mg/dL (ref 65–99)
Potassium: 4.2 mmol/L (ref 3.5–5.2)
Sodium: 139 mmol/L (ref 134–144)
Total Protein: 6.6 g/dL (ref 6.0–8.5)
eGFR: 86 mL/min/{1.73_m2} (ref >59)

## 2021-03-03 LAB — LIPID PANEL WITH LDL/HDL RATIO
Cholesterol, Total: 162 mg/dL (ref 100–199)
HDL: 51 mg/dL (ref >39)
LDL Chol Calc (NIH): 93 mg/dL (ref 0–99)
LDL/HDL Ratio: 1.8 ratio (ref 0.0–3.2)
Triglycerides: 96 mg/dL (ref 0–149)
VLDL Cholesterol Cal: 18 mg/dL (ref 5–40)

## 2021-03-03 LAB — TSH: TSH: 0.875 u[IU]/mL (ref 0.450–4.500)

## 2021-03-05 LAB — SPECIMEN STATUS REPORT

## 2021-03-05 LAB — URINALYSIS, MICROSCOPIC ONLY
Bacteria, UA: NONE SEEN
Casts: NONE SEEN /lpf

## 2021-03-05 LAB — CULTURE, URINE COMPREHENSIVE

## 2021-03-09 ENCOUNTER — Other Ambulatory Visit: Payer: Self-pay

## 2021-03-09 ENCOUNTER — Ambulatory Visit
Admission: RE | Admit: 2021-03-09 | Discharge: 2021-03-09 | Disposition: A | Discharge status: Home / Self Care | Payer: Medicare Other | Source: Ambulatory Visit | Attending: Family Medicine | Admitting: Family Medicine

## 2021-03-09 DIAGNOSIS — Z1231 Encounter for screening mammogram for malignant neoplasm of breast: Secondary | ICD-10-CM | POA: Insufficient documentation

## 2021-03-19 ENCOUNTER — Encounter: Payer: Self-pay | Admitting: Oncology

## 2021-03-19 ENCOUNTER — Inpatient Hospital Stay (HOSPITAL_BASED_OUTPATIENT_CLINIC_OR_DEPARTMENT_OTHER): Payer: Medicare Other | Admitting: Oncology

## 2021-03-19 ENCOUNTER — Inpatient Hospital Stay: Payer: Medicare Other | Attending: Oncology

## 2021-03-19 VITALS — BP 117/74 | HR 66 | Temp 98.3°F | Resp 16 | Wt 179.1 lb

## 2021-03-19 DIAGNOSIS — Z87891 Personal history of nicotine dependence: Secondary | ICD-10-CM | POA: Diagnosis not present

## 2021-03-19 DIAGNOSIS — G629 Polyneuropathy, unspecified: Secondary | ICD-10-CM

## 2021-03-19 DIAGNOSIS — C55 Malignant neoplasm of uterus, part unspecified: Secondary | ICD-10-CM

## 2021-03-19 DIAGNOSIS — Z79899 Other long term (current) drug therapy: Secondary | ICD-10-CM | POA: Diagnosis not present

## 2021-03-19 LAB — CBC WITH DIFFERENTIAL/PLATELET
Abs Immature Granulocytes: 0.02 10*3/uL (ref 0.00–0.07)
Basophils Absolute: 0.1 10*3/uL (ref 0.0–0.1)
Basophils Relative: 1 %
Eosinophils Absolute: 0.4 10*3/uL (ref 0.0–0.5)
Eosinophils Relative: 5 %
HCT: 39.5 % (ref 36.0–46.0)
Hemoglobin: 13.3 g/dL (ref 12.0–15.0)
Immature Granulocytes: 0 %
Lymphocytes Relative: 27 %
Lymphs Abs: 2.1 10*3/uL (ref 0.7–4.0)
MCH: 30.4 pg (ref 26.0–34.0)
MCHC: 33.7 g/dL (ref 30.0–36.0)
MCV: 90.4 fL (ref 80.0–100.0)
Monocytes Absolute: 0.6 10*3/uL (ref 0.1–1.0)
Monocytes Relative: 7 %
Neutro Abs: 4.6 10*3/uL (ref 1.7–7.7)
Neutrophils Relative %: 60 %
Platelets: 222 10*3/uL (ref 150–400)
RBC: 4.37 MIL/uL (ref 3.87–5.11)
RDW: 13.1 % (ref 11.5–15.5)
WBC: 7.7 10*3/uL (ref 4.0–10.5)
nRBC: 0 % (ref 0.0–0.2)

## 2021-03-19 LAB — COMPREHENSIVE METABOLIC PANEL
ALT: 18 U/L (ref 0–44)
AST: 23 U/L (ref 15–41)
Albumin: 3.8 g/dL (ref 3.5–5.0)
Alkaline Phosphatase: 59 U/L (ref 38–126)
Anion gap: 10 (ref 5–15)
BUN: 20 mg/dL (ref 8–23)
CO2: 25 mmol/L (ref 22–32)
Calcium: 9 mg/dL (ref 8.9–10.3)
Chloride: 104 mmol/L (ref 98–111)
Creatinine, Ser: 0.9 mg/dL (ref 0.44–1.00)
GFR, Estimated: >60 mL/min (ref >60)
Glucose, Bld: 99 mg/dL (ref 70–99)
Potassium: 3.9 mmol/L (ref 3.5–5.1)
Sodium: 139 mmol/L (ref 135–145)
Total Bilirubin: 0.7 mg/dL (ref 0.3–1.2)
Total Protein: 7 g/dL (ref 6.5–8.1)

## 2021-03-19 NOTE — Progress Notes (Signed)
Patient here for follow up. No new concerns voiced.  °

## 2021-03-19 NOTE — Progress Notes (Signed)
Hematology/Oncology Follow up  note [facility] [phone] [phone]   Patient Care Team: Paulene Floor as PCP - General (Physician Assistant) Mellody Drown, MD as Referring Physician (Obstetrics and Gynecology) Clent Jacks, RN as Oncology Nurse Navigator Noreene Filbert, MD as Referring Physician (Radiation Oncology) Earlie Server, MD as Consulting Physician (Oncology) Pa, [place] (Optometry) REASON FOR VISIT Follow up for treatment of uterus carcinosarcoma  HISTORY OF PRESENTING ILLNESS:  Katherine Snyder is a  70 y.o.  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 Pathology showed  A uterus with cervix hysterectomy -Carcinosarcoma B sentinel lymph node, right primary obturator negative C lymph node right external iliac lymph negative D fallopian tube and ovary, unilateral right negative E sentinel lymph node right low periaortic: Negative F sentinel lymph node left low periaortic negative  CANCER CASE SUMMARY: ENDOMETRIUM  Procedure: Total hysterectomy and bilateral salpingo-oophorectomy  Histologic Type: Carcinosarcoma  Histologic Grade: Not applicable  Myometrial Invasion: Present    Depth of myometrial invasion cannot be determined due to exophytic  tumor growth and effacement of endometrial/myometrial junction    Myometrial thickness: At least 8 mm    Percentage depth of myometrial invasion: Estimated less than 50%  Uterine Serosa Involvement: Not identified  Cervical Stromal Involvement: Not identified  Other Tissue/Organ Involvement: Not identified  Peritoneal/ Ascitic Fluid: Negative for malignancy  Lymphovascular Invasion: Present  Regional Lymph Nodes: All lymph nodes negative for tumor cells  Number of  Lymph Nodes Examined: 5    Total number of pelvic nodes examined: 2    Number of pelvic sentinel nodes examined: 1    Total number of para-aortic nodes examined: 3    Number of para-aortic sentinel nodes examined: 3   03/01/2018 Pathologic Stage Classification (pTNM, AJCC 8th Edition) (Note J): pT1a  pN0/FIGO IA   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   Pertinent oncology history Katherine Snyder is a 70 y.o. female who has above history reviewed by me today presents for assessment prior to Cycle 6 adjuvant chemotherapy management for stage Ia endometrial carcinosarcoma. During cycle 1 treatment, reported feeling burning down and having really bad heartburn.  Taxol was temporarily stopped.  The patient was given Benadryl, Solu-Medrol, Pepcid, symptoms improved and resolved.  Taxol was restarted and patient is able to finish her treatment. Patient reports feeling extremely tired for 2 days after chemotherapy.  She was seen by nurse practitioner Sonia Baller during interval.   She complained burning sensation with urination and had UA and urine culture obtained.  UA was not convincing for UTI urine culture showed less than 10,000 colonies of insignificant growth.  Patient remains afebrile. Patient also reports her chronic fasciitis of foot has got a lot worse since the start of chemotherapy.  Possible neuropathy was discussed and the patient is reluctant to start gabapentin or Cymbalta.  She was given tramadol 100 mg twice daily.  Patient reports symptoms gradually improved.    Patient was found to have neutropenia so she received Neupogen x 2. She reports bone pain started after Neupogen shots.   Marland Kitchen#Lung nodules # 12/10/2018- CT Chest wo  contrast -New sub solid density measuring  12 mm with associated 3 mm solid nodule is noted in left upper lobe.Stable sub solid density is noted in right upper lobe with grossly stable 4 mm solid nodule. Multiple other pulmonary nodules are noted in both lungs which are stable compared to prior exam Case was discussed on tumor board and recommend surveillance CT in 3 months.   new lung nodules have resolved on 10/14/2019  All other nodules are stable.   # #Left thyroid nodule,    S/p thyroid nodule FNA.- pathology is benign.   INTERVAL HISTORY Katherine Snyder is a 70 y.o. female who has above history reviewed by me presents for assessment prior to Cycle 6 adjuvant chemotherapy for stage Ia endometrial carcinosarcoma.  Problems and complaints are listed below: Patient has no new complaints today.  Neuropathy has improved after taking Cymbalta.  She also has improvement in sleep and mood.    Review of Systems  Constitutional: Negative for chills, fever, malaise/fatigue and weight loss.  HENT: Negative for sore throat.   Eyes: Negative for redness.  Respiratory: Negative for cough, shortness of breath and wheezing.   Cardiovascular: Negative for chest pain, palpitations and leg swelling.  Gastrointestinal: Negative for abdominal pain, blood in stool, nausea and vomiting.  Genitourinary: Negative for dysuria.  Musculoskeletal: Negative for myalgias.  Skin: Negative for rash.  Neurological: Negative for dizziness, tingling and tremors.  Endo/Heme/Allergies: Does not bruise/bleed easily.  Psychiatric/Behavioral: Negative for hallucinations.    MEDICAL HISTORY:  Past Medical History:  Diagnosis Date  . Arthritis   . Cancer ([place]) 2019   endometrial  . Endometrial mass   . History of chicken pox   . History of kidney stones   . History of measles   . History of mumps   . Osteoporosis   . Personal history of chemotherapy   . Personal history of radiation therapy   . PMB (postmenopausal bleeding)   . Psoriasis     SURGICAL  HISTORY: Past Surgical History:  Procedure Laterality Date  . APPENDECTOMY  1960  . BREAST EXCISIONAL BIOPSY Left 1980   neg  . BREAST SURGERY Left    biopsy  . COLONOSCOPY WITH PROPOFOL N/A 03/13/2020   Procedure: COLONOSCOPY WITH PROPOFOL;  Surgeon: Jonathon Bellows, MD;  Location: [facility] ENDOSCOPY;  Service: Gastroenterology;  Laterality: N/A;  . Injection varicose veins in legs Bilateral 2010   Dr. Hulda Humphrey  . LITHOTRIPSY  1994, 2004   for renal stones  . PORTA CATH INSERTION N/A 04/24/2018   Procedure: PORTA CATH INSERTION;  Surgeon: Katha Cabal, MD;  Location: [facility] CV LAB;  Service: Cardiovascular;  Laterality: N/A;  . PORTA CATH REMOVAL N/A 01/18/2021   Procedure: PORTA CATH REMOVAL;  Surgeon: Algernon Huxley, MD;  Location: [facility] CV LAB;  Service: Cardiovascular;  Laterality: N/A;  . ROBOTIC ASSISTED TOTAL HYSTERECTOMY Bilateral 04/04/2018   Procedure: ROBOTIC ASSISTED TOTAL HYSTERECTOMY,SENTINEL LYMPH NODE MAPPING AND BIOPSIES,AORTIC LYMPH NODE DISSECTION;  Surgeon: Gillis Ends, MD;  Location: [facility] ORS;  Service: Gynecology;  Laterality: Bilateral;  . TUBAL LIGATION  1980    SOCIAL HISTORY: Social History   Socioeconomic History  . Marital status: Divorced    Spouse name: single  . Number of children: 1  . Years of education: Not on file  . Highest education level: 10th grade  Occupational History  . Occupation: retired  Tobacco Use  . Smoking status: Former Smoker    Packs/day: 0.25  Types: Cigarettes    Quit date: 12/08/2018    Years since quitting: 2.2  . Smokeless tobacco: Never Used  Vaping Use  . Vaping Use: Some days  Substance and Sexual Activity  . Alcohol use: No    Alcohol/week: 0.0 standard drinks  . Drug use: No  . Sexual activity: Not Currently  Other Topics Concern  . Not on file  Social History Narrative  . Not on file   Social Determinants of Health   Financial Resource Strain: Low Risk   . Difficulty of Paying Living  Expenses: Not hard at all  Food Insecurity: No Food Insecurity  . Worried About Charity fundraiser in the Last Year: Never true  . Ran Out of Food in the Last Year: Never true  Transportation Needs: No Transportation Needs  . Lack of Transportation (Medical): No  . Lack of Transportation (Non-Medical): No  Physical Activity: Inactive  . Days of Exercise per Week: 0 days  . Minutes of Exercise per Session: 0 min  Stress: No Stress Concern Present  . Feeling of Stress : Not at all  Social Connections: Socially Isolated  . Frequency of Communication with Friends and Family: Once a week  . Frequency of Social Gatherings with Friends and Family: More than three times a week  . Attends Religious Services: Never  . Active Member of Clubs or Organizations: No  . Attends Archivist Meetings: Never  . Marital Status: Divorced  Human resources officer Violence: Not At Risk  . Fear of Current or Ex-Partner: No  . Emotionally Abused: No  . Physically Abused: No  . Sexually Abused: No    FAMILY HISTORY: Family History  Problem Relation Age of Onset  . Breast cancer Mother   . Transient ischemic attack Mother   . Arthritis Mother   . Hypothyroidism Mother   . Lung cancer Mother   . Diabetes Father        type 2  . Hypertension Father   . Arthritis Father   . Colon cancer Sister   . Prostate cancer Brother   . Hyperlipidemia Son   . Stomach cancer Maternal Grandmother   . Stroke Maternal Grandfather     ALLERGIES:  has No Known Allergies.  MEDICATIONS:  Current Outpatient Medications  Medication Sig Dispense Refill  . betamethasone dipropionate 0.05 % cream Apply 1 application topically 2 (two) times daily as needed (for psoriasis on body). 30 g 1  . desonide (DESOWEN) 0.05 % cream Apply 1 application topically 2 (two) times daily as needed (for psoriasis on face). 30 g 1  . DULoxetine (CYMBALTA) 30 MG capsule TAKE 1 CAPSULE BY MOUTH EVERY DAY 90 capsule 1  . Multiple  Vitamins-Minerals (CENTRUM SILVER ADULT 50+ PO) Take 1 tablet by mouth daily.    . naproxen sodium (ALEVE) 220 MG tablet Take 220 mg by mouth daily as needed.     No current facility-administered medications for this visit.     PHYSICAL EXAMINATION: ECOG PERFORMANCE STATUS: 1 - Symptomatic but completely ambulatory Vitals:   03/19/21 0957  BP: 117/74  Pulse: 66  Resp: 16  Temp: 98.3 F (36.8 C)  SpO2: 98%   Filed Weights   03/19/21 0957  Weight: 179 lb 1.6 oz (81.2 kg)    Physical Exam Constitutional:      General: She is not in acute distress. HENT:     Head: Normocephalic and atraumatic.     Right Ear: External ear normal.  Eyes:  General: No scleral icterus.    Conjunctiva/sclera: Conjunctivae normal.     Pupils: Pupils are equal, round, and reactive to light.  Cardiovascular:     Rate and Rhythm: Normal rate and regular rhythm.     Heart sounds: Normal heart sounds.  Pulmonary:     Effort: Pulmonary effort is normal. No respiratory distress.     Breath sounds: Normal breath sounds. No wheezing or rales.  Chest:     Chest wall: No tenderness.  Abdominal:     General: Bowel sounds are normal. There is no distension.     Palpations: Abdomen is soft. There is no mass.     Tenderness: There is no abdominal tenderness.  Musculoskeletal:        General: No deformity. Normal range of motion.     Cervical back: Normal range of motion and neck supple.  Lymphadenopathy:     Cervical: No cervical adenopathy.  Skin:    General: Skin is warm and dry.     Findings: No erythema or rash.  Neurological:     Mental Status: She is alert and oriented to person, place, and time. Mental status is at baseline.     Cranial Nerves: No cranial nerve deficit.     Coordination: Coordination normal.     Deep Tendon Reflexes: Reflexes normal.  Psychiatric:        Mood and Affect: Mood normal.      LABORATORY DATA:  I have reviewed the data as listed Lab Results  Component  Value Date   WBC 7.7 03/19/2021   HGB 13.3 03/19/2021   HCT 39.5 03/19/2021   MCV 90.4 03/19/2021   PLT 222 03/19/2021   Recent Labs    04/13/20 1251 07/02/20 1409 09/22/20 0946 03/02/21 1046 03/19/21 0937  NA 139  --  139 139 139  K 4.4  --  4.3 4.2 3.9  CL 103  --  103 103 104  CO2 26  --  26 21 25   GLUCOSE 99  --  89 92 99  BUN 20  --  20 18 20   CREATININE 0.82   < > 0.92 0.76 0.90  CALCIUM 9.2  --  9.0 9.2 9.0  GFRNONAA >60  --  >60  --  >60  GFRAA >60  --   --   --   --   PROT 7.5  --  7.1 6.6 7.0  ALBUMIN 4.0  --  3.8 4.3 3.8  AST 20  --  21 14 23   ALT 15  --  18 11 18   ALKPHOS 67  --  64 70 59  BILITOT 0.7  --  0.5 0.4 0.7   < > = values in this interval not displayed.   Iron/TIBC/Ferritin/ %Sat No results found for: IRON, TIBC, FERRITIN, IRONPCTSAT   RADIOGRAPHIC STUDIES: I have personally reviewed the radiological images as listed and agreed with the findings in the report. PERIPHERAL VASCULAR CATHETERIZATION  Result Date: 01/18/2021 See op note  MM 3D SCREEN BREAST BILATERAL  Result Date: 03/09/2021 CLINICAL DATA:  Screening. EXAM: DIGITAL SCREENING BILATERAL MAMMOGRAM WITH TOMOSYNTHESIS AND CAD TECHNIQUE: Bilateral screening digital craniocaudal and mediolateral oblique mammograms were obtained. Bilateral screening digital breast tomosynthesis was performed. The images were evaluated with computer-aided detection. COMPARISON:  Previous exam(s). ACR Breast Density Category c: The breast tissue is heterogeneously dense, which may obscure small masses. FINDINGS: There are no findings suspicious for malignancy. The images were evaluated with computer-aided detection. IMPRESSION: No mammographic evidence  of malignancy. A result letter of this screening mammogram will be mailed directly to the patient. RECOMMENDATION: Screening mammogram in one year. (Code:SM-B-01Y) BI-RADS CATEGORY  1: Negative. Electronically Signed   By: Kristopher Oppenheim M.D.   On: 03/09/2021 11:07      ASSESSMENT & PLAN:  1. Uterine carcinosarcoma ([place])   2. Neuropathy   Stage IA Uterine carcinosarcoma  Status post 6 cycles adjuvant carboplatinum AUC 5 and Taxol 135 mg/m. Clinically patient is doing very well  Labs are reviewed and discussed with patient. CA1 25 has been monitored and stable. Continue surveillance. She alternates visits with me and gynecology oncology every 6 months . # neuropathy continue Cymbalta.  Refills were sent to pharmacy . We spent sufficient time to discuss many aspect of care, questions were answered to patient's satisfaction. The patient knows to call the clinic with any problems questions or concerns.  Return of visit: 6 months Orders Placed This Encounter  Procedures  . Comprehensive metabolic panel    Standing Status:   Future    Standing Expiration Date:   03/19/2022  . CBC with Differential/Platelet    Standing Status:   Future    Standing Expiration Date:   03/19/2022  . CA 125    Standing Status:   Future    Standing Expiration Date:   03/19/2022     Earlie Server, MD, PhD Hematology Oncology [facility] Pager- [phone] 03/19/2021

## 2021-03-20 LAB — CA 125: Cancer Antigen (CA) 125: 9.8 U/mL (ref 0.0–38.1)

## 2021-03-31 ENCOUNTER — Ambulatory Visit
Admission: RE | Admit: 2021-03-31 | Discharge: 2021-03-31 | Disposition: A | Discharge status: Home / Self Care | Payer: Medicare Other | Source: Ambulatory Visit | Attending: Radiation Oncology | Admitting: Radiation Oncology

## 2021-03-31 ENCOUNTER — Encounter: Payer: Self-pay | Admitting: Radiation Oncology

## 2021-03-31 VITALS — BP 125/77 | HR 72 | Temp 97.2°F | Wt 176.0 lb

## 2021-03-31 DIAGNOSIS — R918 Other nonspecific abnormal finding of lung field: Secondary | ICD-10-CM | POA: Diagnosis not present

## 2021-03-31 DIAGNOSIS — C541 Malignant neoplasm of endometrium: Secondary | ICD-10-CM

## 2021-03-31 DIAGNOSIS — Z90710 Acquired absence of both cervix and uterus: Secondary | ICD-10-CM | POA: Diagnosis not present

## 2021-03-31 DIAGNOSIS — Z8542 Personal history of malignant neoplasm of other parts of uterus: Secondary | ICD-10-CM | POA: Insufficient documentation

## 2021-03-31 DIAGNOSIS — Z923 Personal history of irradiation: Secondary | ICD-10-CM | POA: Diagnosis not present

## 2021-03-31 NOTE — Progress Notes (Signed)
Radiation Oncology Follow up Note  Name: Katherine Snyder   Date:   03/31/2021 MRN:  [id number] DOB: September 28, 1951    This 70 y.o. female presents to the clinic today for 2-year follow-up status post vaginal brachytherapy for stage I carcinosarcoma of the uterus status post robotic assisted hysterectomy.  REFERRING PROVIDER: Chrismon, Vickki Muff, PA-C  HPI: Patient is a 70 year old female now out 2 years having completed vaginal brachytherapy for stage I carcinosarcoma of the uterus status post robotic assisted prostatectomy.  Seen today in routine follow-up she is doing well she specifically denies any increased lower urinary tract symptoms diarrhea pelvic pain..  Patient also received 6 cycles of adjuvant CarboTaxol.  She had CT scan of abdomen chest and pelvis back in August which showed no evidence of pathology intrathoracic abdominal or pelvic and no evidence of metastatic disease.  She had bilateral pulmonary nodules which were similar to prior CT scans.  She had a pelvic exam back in February which was benign scheduled for another exam in August.  COMPLICATIONS OF TREATMENT: none  FOLLOW UP COMPLIANCE: keeps appointments   PHYSICAL EXAM:  BP 125/77   Pulse 72   Temp (!) 97.2 F (36.2 C) (Tympanic)   Wt 176 lb (79.8 kg)   BMI 27.57 kg/m  Well-developed well-nourished patient in NAD. HEENT reveals PERLA, EOMI, discs not visualized.  Oral cavity is clear. No oral mucosal lesions are identified. Neck is clear without evidence of cervical or supraclavicular adenopathy. Lungs are clear to A&P. Cardiac examination is essentially unremarkable with regular rate and rhythm without murmur rub or thrill. Abdomen is benign with no organomegaly or masses noted. Motor sensory and DTR levels are equal and symmetric in the upper and lower extremities. Cranial nerves II through XII are grossly intact. Proprioception is intact. No peripheral adenopathy or edema is identified. No motor or sensory levels are  noted. Crude visual fields are within normal range.  RADIOLOGY RESULTS: CT scans reviewed compatible with above-stated findings  PLAN: Present time patient continues to do well now out 2 years with no evidence of disease.  I am pleased with her overall progress.  I have asked to see her back in 1 year for follow-up.  She continues close follow-up care with both medical oncology and GYN oncology.  Patient knows to call with any concerns.  I would like to take this opportunity to thank you for allowing me to participate in the care of your patient.Noreene Filbert, MD

## 2021-04-10 ENCOUNTER — Other Ambulatory Visit: Payer: Self-pay | Admitting: Oncology

## 2021-06-28 ENCOUNTER — Telehealth: Payer: Self-pay | Admitting: Obstetrics and Gynecology

## 2021-06-28 NOTE — Telephone Encounter (Signed)
Received scheduling message to move patients scheduled in the afternoon to the morning on 8/24. Unable to reach patient. No answer and VM full.

## 2021-06-30 ENCOUNTER — Inpatient Hospital Stay: Payer: Medicare Other | Attending: Obstetrics and Gynecology | Admitting: Obstetrics and Gynecology

## 2021-06-30 VITALS — BP 121/74 | HR 72 | Temp 98.7°F | Resp 20 | Wt 174.2 lb

## 2021-06-30 DIAGNOSIS — Z8542 Personal history of malignant neoplasm of other parts of uterus: Secondary | ICD-10-CM | POA: Diagnosis not present

## 2021-06-30 DIAGNOSIS — Z79899 Other long term (current) drug therapy: Secondary | ICD-10-CM | POA: Insufficient documentation

## 2021-06-30 DIAGNOSIS — Z87891 Personal history of nicotine dependence: Secondary | ICD-10-CM | POA: Insufficient documentation

## 2021-06-30 DIAGNOSIS — Z9221 Personal history of antineoplastic chemotherapy: Secondary | ICD-10-CM | POA: Diagnosis not present

## 2021-06-30 DIAGNOSIS — M722 Plantar fascial fibromatosis: Secondary | ICD-10-CM | POA: Insufficient documentation

## 2021-06-30 DIAGNOSIS — R600 Localized edema: Secondary | ICD-10-CM | POA: Diagnosis not present

## 2021-06-30 DIAGNOSIS — Z923 Personal history of irradiation: Secondary | ICD-10-CM | POA: Diagnosis not present

## 2021-06-30 DIAGNOSIS — Z90710 Acquired absence of both cervix and uterus: Secondary | ICD-10-CM | POA: Insufficient documentation

## 2021-06-30 DIAGNOSIS — R911 Solitary pulmonary nodule: Secondary | ICD-10-CM | POA: Diagnosis not present

## 2021-06-30 DIAGNOSIS — Z90722 Acquired absence of ovaries, bilateral: Secondary | ICD-10-CM | POA: Insufficient documentation

## 2021-06-30 DIAGNOSIS — F17210 Nicotine dependence, cigarettes, uncomplicated: Secondary | ICD-10-CM

## 2021-06-30 DIAGNOSIS — F17200 Nicotine dependence, unspecified, uncomplicated: Secondary | ICD-10-CM

## 2021-06-30 NOTE — Progress Notes (Signed)
Gynecologic Oncology Interval Visit   Referring Provider: Dr. Amalia Hailey  Chief Complaint: Endometrial Cancer  Subjective:  Katherine Snyder is a 70 y.o. 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  She is doing well without complaints. She is a smoker. She did try to stop but restarted smoking again.  She would like to try to stop. H/o chronic leg swelling which is no worse.    IV port removed.   She is not seeing Dr. Amalia Hailey at this time. She is following up with Dr. Tasia Catchings 09/20/21 and with Dr. Baruch Gouty 04/01/22.   GYNECOLOGIC ONCOLOGY HISTORY:  Initially, patient presented to ER on 02/26/18 for PMB. She was reportedly 14 years post menopausal and had had a 2 day episode of vaginal bleeding that was constant.   02/26/18- Ultrasound Pelvic Complete w/ Transvaginal: Endometrium thickness up to 45m. No focal solid or cystic change. IMPRESSION: Mildly enlarged uterus. No discrete fibroids. Markedly thickened endometrium. In the setting of post-menopausal bleeding, endometrial sampling is indicated to exclude carcinoma. If results are benign, sonohysterogram should be considered for focal lesion work-up. (Ref: Radiological Reasoning: Algorithmic Workup of Abnormal Vaginal Bleeding with Endovaginal Sonography and Sonohysterography. AJR 2008; [id number] Nonvisualization of the right ovary. Normal-appearing left ovary. No significant uterine fibroids were observed.  She was seen by Dr. EEllard Artisson 03/01/18 who performed vacurette endometrial biopsy. Uterus was sounded to length to 9 cm. She was started on Aygestin and bleeding stopped.   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  COMMENT: BASED ON INITIAL HISTOLOGIC  FINDINGS, IMMUNOHISTOCHEMISTRY IS EVALUATED:  THE SARCOMATOUS TUMOR COMPONENT IS DIFFUSELY POSITIVE FOR CD10 AND THE CARCINOMA COMPONENT IS POSITIVE FOR PANKERATIN, SUPPORTING THE DIAGNOSIS.   She was seen by Dr. BFransisca Connors Gyn-Onc on 03/28/2018 for initial evaluation and treatment planning.  TLH BSO with sentinel lymph node mapping and biopsies and aortic lymph node dissection were discussed.  [facility] not [facility].   CA 125- 10.5  03/30/18 - CT C/A/P W CONTRAST IMPRESSION: 1. Mass like expansion of the endometrial cavity known primary endometrial carcinoma. 2. No evidence for nodal metastasis or solid organ metastasis within the abdomen or pelvis. 3. Scattered nonspecific solid nodules and a single part solid nodule identified in both lungs. Consensus criteria recommendations for nodule followup does not apply to patients with known primary malignancy. 4. Liver cysts.  She underwent CT Imaging and then robot assisted total hysterectomy with sentinel LN mapping, biopsies, and aortic LN dissection [facility]   A. UTERUS WITH CERVIX; HYSTERECTOMY:  - CARCINOSARCOMA.  - MYOMETRIAL INVASION PRESENT, LESS THAN HALF OF THE MYOMETRIUM.  - NEGATIVE FOR CERVICAL AND SEROSAL INVOLVEMENT.  - SUBMUCOSAL LEIOMYOMA.   FALLOPIAN TUBE AND OVARY, LEFT; SALPINGO-OOPHORECTOMY:  - NEGATIVE FOR MALIGNANCY.  - OVARIAN CORTICAL INCLUSION CYSTS.  - ATROPHIC FALLOPIAN TUBE.   B. SENTINEL LYMPH NODE, RIGHT PRIMARY OBTURATOR; EXCISION:  - NEGATIVE FOR MALIGNANCY, ONE LYMPH NODE (0/1).   C. LYMPH NODE, RIGHT EXTERNAL ILIAC VEIN; EXCISION:  - NEGATIVE FOR MALIGNANCY, ONE LYMPH NODE (0/1).   D. FALLOPIAN TUBE AND OVARY, UNILATERAL (RIGHT); SALPINGO-OOPHORECTOMY:  - NEGATIVE FOR MALIGNANCY.  - OVARIAN CORTICAL INCLUSION CYSTS.  - ATROPHIC FALLOPIAN TUBE.   E. SENTINEL LYMPH NODE,  RIGHT LOW PARA-AORTIC; EXCISION:  - NEGATIVE FOR  MALIGNANCY, TWO LYMPH NODES (0/2).   F. SENTINEL LYMPH NODE, LEFT PRIMARY LOW PARA-AORTIC; EXCISION:  - NEGATIVE FOR MALIGNANCY, ONE LYMPH NODE (0/1).   DIAGNOSIS:  A.  PELVIC WASHINGS:  - NEGATIVE FOR MALIGNANCY.    Her case was discussed at [place] and discussed that recurrence risk without adjuvant treatment is up to 50%.  So recommend adjuvant chemo and radiation.  She was seen by Dr. Baruch Gouty, for initial evaluation of stage IA uterine carcinosarcoma and plan is for brachytherapy after 6 cycles of carbo/taxol.    She initiated carbo-Taxol on 04/25/2018.  Completed 6 cycles on 08/08/2018.  She received vaginal brachytherapy with Dr. Baruch Gouty 10/19-11/09/2018.   She had thyroid ultrasound for incidental thyroid nodule seen on surveillance imaging for indeterminate pulmonary nodules on 5/20. Biopsy was benign.   07/11/18- CT Chest WO Contrast for follow-up on pulmonary nodules IMPRESSION: 1. Previously visualized pulmonary nodules are either stable or resolved. Several new subcentimeter bilateral pulmonary nodules. Pulmonary nodularity generally appears centrilobular in distribution and while considered indeterminate is more suggestive of infectious/inflammatory nodularity. Continued chest CT surveillance is recommended in 3-6 months. 2. No thoracic adenopathy.  12/10/2018- CT Chest wo contrast - New sub solid density measuring 12 mm with associated 3 mm solid nodule is noted in left upper lobe. There also noted several other new nodules noted more posteriorly in the left upper lobe, with the largest measuring 3 mm. These may simply be inflammatory in etiology, but neoplasm can not be excluded. Initial follow-up by chest CT without contrast is recommended in 3 months to confirm persistence. - Stable sub solid density is noted in right upper lobe with grossly stable 4 mm solid nodule. Multiple other pulmonary nodules are noted in both lungs which are stable compared to prior exam - Small  nonobstructive right renal calculus - Stable left thyroid nodule - Stable hepatic cyst  She stopped smoking 2/20, but occasionally using vaping products.    CT scan chest 5/20 IMPRESSION: 1. Stable scattered pulmonary nodules as detailed above including a 1.2 cm part solid nodule in the right upper lobe. The solid component remains stable at approximately 0.4 cm. Follow-up non-contrast CT recommended at 3-6 months to confirm persistence. If unchanged, and solid component remains <6 mm, annual CT is recommended until 5 years of stability has been established. If persistent these nodules should be considered highly suspicious if the solid component of the nodule is 6 mm or greater in size and enlarging. This recommendation follows the consensus statement: Guidelines for Management of Incidental Pulmonary Nodules Detected on CT Images: From the Fleischner Society 2017; Radiology 2017; 284:228-243. 2. Additional scattered pulmonary nodules as detailed above measuring up to approximately 8 mm. These are stable from prior study. Attention on yearly follow-up examinations is recommended. 3. Bilateral nonobstructing renal nephroliths are again noted.  4. Mild bilateral interlobular septal thickening consistent with mild volume overload.  CT scan 8/21 IMPRESSION: 1. No acute intrathoracic, abdominal, or pelvic pathology. 2. No evidence of metastatic disease in the chest, abdomen, or pelvis. 3. Bilateral pulmonary nodules similar to prior CT. 4. Small nonobstructing right renal interpolar calculus. 5. Colonic diverticulosis. No bowel obstruction. 6. Aortic Atherosclerosis (ICD10-I70.0).  Primary care Colonoscopy on 03/13/20 with benign polyps and she will repeat in 5 years. Mammogram 03/09/21 was negative.   Problem List: Patient Active Problem List   Diagnosis Date Noted   Neuropathy 05/16/2018   Fatigue 05/16/2018   Inflammatory  heel pain, right 05/16/2018   Encounter for antineoplastic  chemotherapy 05/16/2018   Drug-induced neutropenia ([place]) 05/09/2018   Goals of care, counseling/discussion 04/18/2018   Endometrial cancer ([place])    Seborrhea 05/13/2016   History of kidney stones 06/26/2015   OP (osteoporosis) 06/26/2015   Plantar fasciitis 06/26/2015   Current tobacco use 06/26/2015   Kidney stone 06/21/2013   Chronic airway obstruction ([place]) 10/08/2009   Psoriasis 09/30/2008   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   Menopausal symptom 09/21/2007   Tobacco use 09/21/2007   Awareness of heartbeats 09/21/2007    Past Medical History: Past Medical History:  Diagnosis Date   Arthritis    Cancer ([place]) 2019   endometrial   Endometrial mass    History of chicken pox    History of kidney stones    History of measles    History of mumps    Osteoporosis    Personal history of chemotherapy    Personal history of radiation therapy    PMB (postmenopausal bleeding)    Psoriasis     Past Surgical History: Past Surgical History:  Procedure Laterality Date   APPENDECTOMY  1960   BREAST EXCISIONAL BIOPSY Left 1980   neg   BREAST SURGERY Left    biopsy   COLONOSCOPY WITH PROPOFOL N/A 03/13/2020   Procedure: COLONOSCOPY WITH PROPOFOL;  Surgeon: Jonathon Bellows, MD;  Location[facility] ENDOSCOPY;  Service: Gastroenterology;  Laterality: N/A;   Injection varicose veins in legs Bilateral 2010   Dr. Hulda Humphrey   LITHOTRIPSY  1994, 2004   for renal stones   PORTA CATH INSERTION N/A 04/24/2018   Procedure: PORTA CATH INSERTION;  Surgeon: Katha Cabal, MD;  Location: [place] CV LAB;  Service: Cardiovascular;  Laterality: N/A;   PORTA CATH REMOVAL N/A 01/18/2021   Procedure: PORTA CATH REMOVAL;  Surgeon: Algernon Huxley, MD;  Location: [place] CV LAB;  Service: Cardiovascular;  Laterality: N/A;   ROBOTIC ASSISTED TOTAL HYSTERECTOMY Bilateral 04/04/2018   Procedure: ROBOTIC ASSISTED TOTAL HYSTERECTOMY,SENTINEL LYMPH NODE MAPPING AND BIOPSIES,AORTIC LYMPH  NODE DISSECTION;  Surgeon: Gillis Ends, MD;  Location: [facility] ORS;  Service: Gynecology;  Laterality: Bilateral;   TUBAL LIGATION  1980    OB History:  OB History  Gravida Para Term Preterm AB Living  '1 1 1     1  '$ SAB IAB Ectopic Multiple Live Births          1    # Outcome Date GA Lbr Len/2nd Weight Sex Delivery Anes PTL Lv  1 Term 53    M Vag-Spont   LIV  [id number] LMP:   Family History: Family History  Problem Relation Age of Onset   Breast cancer Mother    Transient ischemic attack Mother    Arthritis Mother    Hypothyroidism Mother    Lung cancer Mother    Diabetes Father        type 2   Hypertension Father    Arthritis Father    Colon cancer Sister    Prostate cancer Brother    Hyperlipidemia Son    Stomach cancer Maternal Grandmother    Stroke Maternal Grandfather     Social History: Social History   Socioeconomic History   Marital status: Divorced    Spouse name: single   Number of children: 1   Years of education: Not on file   Highest education level: 10th grade  Occupational History   Occupation: retired  Tobacco Use   Smoking  status: Former    Packs/day: 0.25    Types: Cigarettes    Quit date: 12/08/2018    Years since quitting: 2.5   Smokeless tobacco: Never  Vaping Use   Vaping Use: Some days  Substance and Sexual Activity   Alcohol use: No    Alcohol/week: 0.0 standard drinks   Drug use: No   Sexual activity: Not Currently  Other Topics Concern   Not on file  Social History Narrative   Not on file   Social Determinants of Health   Financial Resource Strain: Low Risk    Difficulty of Paying Living Expenses: Not hard at all  Food Insecurity: No Food Insecurity   Worried About Charity fundraiser in the Last Year: Never true   Ran Out of Food in the Last Year: Never true  Transportation Needs: No Transportation Needs   Lack of Transportation (Medical): No   Lack of Transportation (Non-Medical): No  Physical Activity:  Inactive   Days of Exercise per Week: 0 days   Minutes of Exercise per Session: 0 min  Stress: No Stress Concern Present   Feeling of Stress : Not at all  Social Connections: Socially Isolated   Frequency of Communication with Friends and Family: Once a week   Frequency of Social Gatherings with Friends and Family: More than three times a week   Attends Religious Services: Never   Marine scientist or Organizations: No   Attends Music therapist: Never   Marital Status: Divorced  Human resources officer Violence: Not At Risk   Fear of Current or Ex-Partner: No   Emotionally Abused: No   Physically Abused: No   Sexually Abused: No    Allergies: No Known Allergies  Current Medications: Current Outpatient Medications  Medication Sig Dispense Refill   betamethasone dipropionate 0.05 % cream Apply 1 application topically 2 (two) times daily as needed (for psoriasis on body). 30 g 1   desonide (DESOWEN) 0.05 % cream Apply 1 application topically 2 (two) times daily as needed (for psoriasis on face). 30 g 1   DULoxetine (CYMBALTA) 30 MG capsule TAKE 1 CAPSULE BY MOUTH EVERY DAY 90 capsule 1   Multiple Vitamins-Minerals (CENTRUM SILVER ADULT 50+ PO) Take 1 tablet by mouth daily.     naproxen sodium (ALEVE) 220 MG tablet Take 220 mg by mouth daily as needed.     No current facility-administered medications for this visit.   Review of Systems General: no complaints  HEENT: no complaints  Lungs: no complaints  Cardiac: no complaints  GI: no complaints  GU: no complaints  Musculoskeletal: no complaints  Extremities: no complaints  Skin: no complaints  Neuro: no complaints  Endocrine: no complaints  Psych: no complaints        Objective:  Physical Examination:  Vitals:   06/30/21 1319  BP: 121/74  Pulse: 72  Resp: 20  Temp: 98.7 F (37.1 C)  SpO2: 100%   Pain 9/10- pelvic-lower   ECOG Performance Status: 0  GENERAL: Patient is a well appearing female in  no acute distress HEENT:  PERRL, neck supple with midline trachea. No masses.  NODES:  No cervical, supraclavicular, axillary, or inguinal lymphadenopathy palpated.  LUNGS:  Clear to auscultation bilaterally.   HEART:  Regular rate and rhythm.  CHEST: Port removed. Scar well healed.  ABDOMEN:  Soft, nontender, nondistended. No masses/ascites/hernia MSK:  Full range of motion bilaterally in the upper extremities. EXTREMITIES:  No peripheral edema.   SKIN:  Clear with no obvious rashes or skin changes. No nail dyscrasia. NEURO:  Nonfocal. Well oriented.  Appropriate affect.  Pelvic: chaperoned by CMA EGBUS: no lesions Cervix: surgically absent Vagina: no lesions, no discharge or bleeding Uterus: surgically absent BME: no palpable masses Rectovaginal: deferred     Assessment:  Katherine Snyder is a 70 y.o. female diagnosed with stage IA uterine carcinosarcoma with RA TLH/BSO, negative staging 5/19 followed by vaginal brachytherapy and 6 cycles of carbo/taxol chemotherapy completed 08/08/2018.  NED on exam today.     Chest CTs have  shown indeterminate lung nodules, some disapearing over time and some new thought to be benign.  She is a current smoker.   Hepatic cyst, stable on CT.   H/o nonobstructing kidney stones on CT   Comorbidities complicating care: smoker, plantar fasciitis  Plan:   Problem List Items Addressed This Visit   None Visit Diagnoses     Lung nodule    -  Primary   Relevant Orders   CT CHEST ABDOMEN PELVIS W CONTRAST   History of endometrial cancer       Smoker          Previously reviewed NCCN guidelines for surveillance including physical exam every 3-6 months for 2- 3 years then every 6 months for years 4-5 then annually thereafter. She will be 2 years post completion of treatment in October. We will alternate with Dr. Tasia Catchings who will see her in 3 months and we will see her back in 6 months. She will also be seeing Dr. Baruch Gouty in 03/2022. I will check with Dr.  Amalia Hailey and see if he would like to be involved in her surveillance visits.   I reviewed CT findings with her and she was given a copy of her report. Given her smoking history we will order a repeat Chest CT for screening. We also provided material regarding smoking cessation. I recommended she get access to MyChart to see her results.   She agrees with this plan.   A total of 25 minutes were spent with the patient/family today; >50% was spent in education, counseling and coordination of care for h/o endometrial cancer, pulmonary nodules, smoking cessation, hepatic cysts, and kidney stones.   Draycen Leichter Gaetana Michaelis, MD

## 2021-07-15 ENCOUNTER — Ambulatory Visit: Admission: RE | Admit: 2021-07-15 | Payer: Medicare Other | Source: Ambulatory Visit

## 2021-07-21 ENCOUNTER — Other Ambulatory Visit: Payer: Self-pay

## 2021-07-21 ENCOUNTER — Ambulatory Visit
Admission: RE | Admit: 2021-07-21 | Discharge: 2021-07-21 | Disposition: A | Discharge status: Home / Self Care | Payer: Medicare Other | Source: Ambulatory Visit | Attending: Obstetrics and Gynecology | Admitting: Obstetrics and Gynecology

## 2021-07-21 DIAGNOSIS — R911 Solitary pulmonary nodule: Secondary | ICD-10-CM

## 2021-07-21 DIAGNOSIS — R918 Other nonspecific abnormal finding of lung field: Secondary | ICD-10-CM | POA: Diagnosis not present

## 2021-07-21 DIAGNOSIS — Z8542 Personal history of malignant neoplasm of other parts of uterus: Secondary | ICD-10-CM | POA: Diagnosis not present

## 2021-07-21 DIAGNOSIS — K5730 Diverticulosis of large intestine without perforation or abscess without bleeding: Secondary | ICD-10-CM | POA: Diagnosis not present

## 2021-07-21 DIAGNOSIS — M5136 Other intervertebral disc degeneration, lumbar region: Secondary | ICD-10-CM | POA: Diagnosis not present

## 2021-07-21 DIAGNOSIS — K769 Liver disease, unspecified: Secondary | ICD-10-CM | POA: Diagnosis not present

## 2021-07-21 DIAGNOSIS — M47817 Spondylosis without myelopathy or radiculopathy, lumbosacral region: Secondary | ICD-10-CM | POA: Diagnosis not present

## 2021-07-21 DIAGNOSIS — I700 Atherosclerosis of aorta: Secondary | ICD-10-CM | POA: Diagnosis not present

## 2021-07-21 DIAGNOSIS — J9811 Atelectasis: Secondary | ICD-10-CM | POA: Diagnosis not present

## 2021-07-21 LAB — POCT I-STAT CREATININE: Creatinine, Ser: 0.9 mg/dL (ref 0.44–1.00)

## 2021-07-21 MED ORDER — IOHEXOL 350 MG/ML SOLN
100.0000 mL | Freq: Once | INTRAVENOUS | Status: AC | PRN
  Administered 2021-07-21: 100 mL via INTRAVENOUS

## 2021-09-02 ENCOUNTER — Ambulatory Visit: Payer: Medicare Other | Admitting: Family Medicine

## 2021-09-20 ENCOUNTER — Inpatient Hospital Stay: Payer: Medicare Other

## 2021-09-20 ENCOUNTER — Encounter: Payer: Self-pay | Admitting: Oncology

## 2021-09-20 ENCOUNTER — Inpatient Hospital Stay: Payer: Medicare Other | Attending: Oncology | Admitting: Oncology

## 2021-09-20 ENCOUNTER — Other Ambulatory Visit: Payer: Self-pay

## 2021-09-20 VITALS — BP 123/80 | HR 67 | Temp 97.2°F | Wt 174.3 lb

## 2021-09-20 DIAGNOSIS — C541 Malignant neoplasm of endometrium: Secondary | ICD-10-CM | POA: Diagnosis present

## 2021-09-20 DIAGNOSIS — R911 Solitary pulmonary nodule: Secondary | ICD-10-CM

## 2021-09-20 DIAGNOSIS — F17210 Nicotine dependence, cigarettes, uncomplicated: Secondary | ICD-10-CM | POA: Diagnosis not present

## 2021-09-20 DIAGNOSIS — G629 Polyneuropathy, unspecified: Secondary | ICD-10-CM | POA: Diagnosis not present

## 2021-09-20 DIAGNOSIS — Z79899 Other long term (current) drug therapy: Secondary | ICD-10-CM | POA: Insufficient documentation

## 2021-09-20 DIAGNOSIS — R918 Other nonspecific abnormal finding of lung field: Secondary | ICD-10-CM | POA: Diagnosis not present

## 2021-09-20 DIAGNOSIS — E041 Nontoxic single thyroid nodule: Secondary | ICD-10-CM | POA: Insufficient documentation

## 2021-09-20 DIAGNOSIS — Z8542 Personal history of malignant neoplasm of other parts of uterus: Secondary | ICD-10-CM

## 2021-09-20 DIAGNOSIS — C55 Malignant neoplasm of uterus, part unspecified: Secondary | ICD-10-CM

## 2021-09-20 LAB — COMPREHENSIVE METABOLIC PANEL
ALT: 13 U/L (ref 0–44)
AST: 19 U/L (ref 15–41)
Albumin: 3.8 g/dL (ref 3.5–5.0)
Alkaline Phosphatase: 61 U/L (ref 38–126)
Anion gap: 10 (ref 5–15)
BUN: 17 mg/dL (ref 8–23)
CO2: 28 mmol/L (ref 22–32)
Calcium: 9.1 mg/dL (ref 8.9–10.3)
Chloride: 101 mmol/L (ref 98–111)
Creatinine, Ser: 0.78 mg/dL (ref 0.44–1.00)
GFR, Estimated: >60 mL/min (ref >60)
Glucose, Bld: 93 mg/dL (ref 70–99)
Potassium: 4.2 mmol/L (ref 3.5–5.1)
Sodium: 139 mmol/L (ref 135–145)
Total Bilirubin: 0.6 mg/dL (ref 0.3–1.2)
Total Protein: 7.3 g/dL (ref 6.5–8.1)

## 2021-09-20 LAB — CBC WITH DIFFERENTIAL/PLATELET
Abs Immature Granulocytes: 0.01 10*3/uL (ref 0.00–0.07)
Basophils Absolute: 0.1 10*3/uL (ref 0.0–0.1)
Basophils Relative: 1 %
Eosinophils Absolute: 0.2 10*3/uL (ref 0.0–0.5)
Eosinophils Relative: 3 %
HCT: 41 % (ref 36.0–46.0)
Hemoglobin: 13.6 g/dL (ref 12.0–15.0)
Immature Granulocytes: 0 %
Lymphocytes Relative: 31 %
Lymphs Abs: 2.2 10*3/uL (ref 0.7–4.0)
MCH: 30.4 pg (ref 26.0–34.0)
MCHC: 33.2 g/dL (ref 30.0–36.0)
MCV: 91.5 fL (ref 80.0–100.0)
Monocytes Absolute: 0.6 10*3/uL (ref 0.1–1.0)
Monocytes Relative: 8 %
Neutro Abs: 4.1 10*3/uL (ref 1.7–7.7)
Neutrophils Relative %: 57 %
Platelets: 229 10*3/uL (ref 150–400)
RBC: 4.48 MIL/uL (ref 3.87–5.11)
RDW: 13.1 % (ref 11.5–15.5)
WBC: 7.1 10*3/uL (ref 4.0–10.5)
nRBC: 0 % (ref 0.0–0.2)

## 2021-09-20 MED ORDER — DULOXETINE HCL 30 MG PO CPEP: 30.0000 mg | ORAL_CAPSULE | Freq: Every day | ORAL | 1 refills | Status: DC

## 2021-09-20 NOTE — Progress Notes (Signed)
Hematology/Oncology Follow up  note [phone] [phone]   Patient Care Team: Trinna Post, PA-C (Inactive) as PCP - General (Physician Assistant) Mellody Drown, MD as Referring Physician (Obstetrics and Gynecology) Clent Jacks, RN as Oncology Nurse Navigator Noreene Filbert, MD as Referring Physician (Radiation Oncology) Earlie Server, MD as Consulting Physician (Oncology) Pa, [place] (Optometry) REASON FOR VISIT Follow up for treatment of uterus carcinosarcoma  HISTORY OF PRESENTING ILLNESS:  Katherine Snyder is a  70 y.o.  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 Pathology showed  A uterus with cervix hysterectomy -Carcinosarcoma B sentinel lymph node, right primary obturator negative C lymph node right external iliac lymph negative D fallopian tube and ovary, unilateral right negative E sentinel lymph node right low periaortic: Negative F sentinel lymph node left low periaortic negative  CANCER CASE SUMMARY: ENDOMETRIUM  Procedure: Total hysterectomy and bilateral salpingo-oophorectomy  Histologic Type: Carcinosarcoma  Histologic Grade: Not applicable  Myometrial Invasion: Present       Depth of myometrial invasion cannot be determined due to exophytic  tumor growth and effacement of endometrial/myometrial junction       Myometrial thickness: At least 8 mm       Percentage depth of myometrial invasion: Estimated less than 50%  Uterine Serosa Involvement: Not identified  Cervical Stromal Involvement: Not identified  Other Tissue/Organ Involvement: Not identified  Peritoneal/ Ascitic Fluid: Negative for malignancy  Lymphovascular Invasion: Present  Regional Lymph Nodes: All lymph nodes negative for tumor cells  Number of Lymph Nodes Examined:  5       Total number of pelvic nodes examined: 2       Number of pelvic sentinel nodes examined: 1       Total number of para-aortic nodes examined: 3       Number of para-aortic sentinel nodes examined: 3   03/01/2018 Pathologic Stage Classification (pTNM, AJCC 8th Edition) (Note J): pT1a  pN0/FIGO IA   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   Pertinent oncology history Katherine Snyder is a 70 y.o. female who has above history reviewed by me today presents for assessment prior to Cycle 6 adjuvant chemotherapy management for stage Ia endometrial carcinosarcoma. During cycle 1 treatment, reported feeling burning down and having really bad heartburn.  Taxol was temporarily stopped.  The patient was given Benadryl, Solu-Medrol, Pepcid, symptoms improved and resolved.  Taxol was restarted and patient is able to finish her treatment. Patient reports feeling extremely tired for 2 days after chemotherapy.  She was seen by nurse practitioner Sonia Baller during interval.   She complained burning sensation with urination and had UA and urine culture obtained.  UA was not convincing for UTI urine culture showed less than 10,000 colonies of insignificant growth.  Patient remains afebrile. Patient also reports her chronic fasciitis of foot has got a lot worse since the start of chemotherapy.  Possible neuropathy was discussed and the patient is reluctant to start gabapentin or Cymbalta.  She was given tramadol 100 mg twice daily.  Patient reports symptoms gradually improved.    Patient was found to have neutropenia so she received Neupogen  x 2. She reports bone pain started after Neupogen shots.   Marland Kitchen#Lung nodules # 12/10/2018- CT Chest wo contrast - New sub solid density measuring 12 mm with associated  3 mm solid nodule is noted in left upper lobe.Stable sub solid density is noted in right upper lobe with grossly stable 4 mm solid nodule. Multiple other pulmonary nodules are noted in both lungs which are stable compared to prior exam Case was discussed on tumor board and recommend surveillance CT in 3 months.   new lung nodules have resolved on 10/14/2019  All other nodules are stable.   # #Left thyroid nodule,    S/p thyroid nodule FNA.- pathology is benign.   INTERVAL HISTORY Katherine Snyder is a 70 y.o. female who has above history reviewed by me presents for assessment prior to Cycle 6 adjuvant chemotherapy for stage Ia endometrial carcinosarcoma.  Problems and complaints are listed below: Patient has no new complaints today. Patient reports feeling well.  She continues to alternate follow-up between gynecology oncology and me. Neuropathy symptoms are better, she is on Cymbalta 30 mg daily.  She request a refill of the medication.  No new complaints.    Review of Systems  Constitutional:  Negative for chills, fever, malaise/fatigue and weight loss.  HENT:  Negative for sore throat.   Eyes:  Negative for redness.  Respiratory:  Negative for cough, shortness of breath and wheezing.   Cardiovascular:  Negative for chest pain, palpitations and leg swelling.  Gastrointestinal:  Negative for abdominal pain, blood in stool, nausea and vomiting.  Genitourinary:  Negative for dysuria.  Musculoskeletal:  Negative for myalgias.  Skin:  Negative for rash.  Neurological:  Negative for dizziness, tingling and tremors.  Endo/Heme/Allergies:  Does not bruise/bleed easily.  Psychiatric/Behavioral:  Negative for hallucinations.    MEDICAL HISTORY:  Past Medical History:  Diagnosis Date   Arthritis    Cancer ([place]) 2019   endometrial   Endometrial mass    History of chicken pox    History of kidney stones    History of measles    History of mumps    Osteoporosis    Personal history of  chemotherapy    Personal history of radiation therapy    PMB (postmenopausal bleeding)    Psoriasis     SURGICAL HISTORY: Past Surgical History:  Procedure Laterality Date   APPENDECTOMY  1960   BREAST EXCISIONAL BIOPSY Left 1980   neg   BREAST SURGERY Left    biopsy   COLONOSCOPY WITH PROPOFOL N/A 03/13/2020   Procedure: COLONOSCOPY WITH PROPOFOL;  Surgeon: Jonathon Bellows, MD;  Location: [facility] ENDOSCOPY;  Service: Gastroenterology;  Laterality: N/A;   Injection varicose veins in legs Bilateral 2010   Dr. Hulda Humphrey   LITHOTRIPSY  1994, 2004   for renal stones   PORTA CATH INSERTION N/A 04/24/2018   Procedure: PORTA CATH INSERTION;  Surgeon: Katha Cabal, MD;  Location: [place] CV LAB;  Service: Cardiovascular;  Laterality: N/A;   PORTA CATH REMOVAL N/A 01/18/2021   Procedure: PORTA CATH REMOVAL;  Surgeon: Algernon Huxley, MD;  Location: [place] [facility] LAB;  Service: Cardiovascular;  Laterality: N/A;   ROBOTIC ASSISTED TOTAL HYSTERECTOMY Bilateral 04/04/2018   Procedure: ROBOTIC ASSISTED TOTAL HYSTERECTOMY,SENTINEL LYMPH NODE MAPPING AND BIOPSIES,AORTIC LYMPH NODE DISSECTION;  Surgeon: Gillis Ends, MD;  Location: [facility] ORS;  Service: Gynecology;  Laterality: Bilateral;   TUBAL LIGATION  1980    SOCIAL HISTORY: Social History   Socioeconomic History  Marital status: Divorced    Spouse name: single   Number of children: 1   Years of education: Not on file   Highest education level: 10th grade  Occupational History   Occupation: retired  Tobacco Use   Smoking status: Every Day    Packs/day: 0.25    Years: 30.00    Pack years: 7.50    Types: Cigarettes   Smokeless tobacco: Never  Vaping Use   Vaping Use: Some days  Substance and Sexual Activity   Alcohol use: No    Alcohol/week: 0.0 standard drinks   Drug use: No   Sexual activity: Not Currently  Other Topics Concern   Not on file  Social History Narrative   Not on file   Social Determinants of Health    Financial Resource Strain: Low Risk    Difficulty of Paying Living Expenses: Not hard at all  Food Insecurity: No Food Insecurity   Worried About Charity fundraiser in the Last Year: Never true   Ran Out of Food in the Last Year: Never true  Transportation Needs: No Transportation Needs   Lack of Transportation (Medical): No   Lack of Transportation (Non-Medical): No  Physical Activity: Inactive   Days of Exercise per Week: 0 days   Minutes of Exercise per Session: 0 min  Stress: No Stress Concern Present   Feeling of Stress : Not at all  Social Connections: Socially Isolated   Frequency of Communication with Friends and Family: Once a week   Frequency of Social Gatherings with Friends and Family: More than three times a week   Attends Religious Services: Never   Marine scientist or Organizations: No   Attends Music therapist: Never   Marital Status: Divorced  Human resources officer Violence: Not At Risk   Fear of Current or Ex-Partner: No   Emotionally Abused: No   Physically Abused: No   Sexually Abused: No    FAMILY HISTORY: Family History  Problem Relation Age of Onset   Breast cancer Mother    Transient ischemic attack Mother    Arthritis Mother    Hypothyroidism Mother    Lung cancer Mother    Diabetes Father        type 2   Hypertension Father    Arthritis Father    Colon cancer Sister    Prostate cancer Brother    Hyperlipidemia Son    Stomach cancer Maternal Grandmother    Stroke Maternal Grandfather     ALLERGIES:  has No Known Allergies.  MEDICATIONS:  Current Outpatient Medications  Medication Sig Dispense Refill   betamethasone dipropionate 0.05 % cream Apply 1 application topically 2 (two) times daily as needed (for psoriasis on body). 30 g 1   desonide (DESOWEN) 0.05 % cream Apply 1 application topically 2 (two) times daily as needed (for psoriasis on face). 30 g 1   Multiple Vitamins-Minerals (CENTRUM SILVER ADULT 50+ PO) Take 1  tablet by mouth daily.     naproxen sodium (ALEVE) 220 MG tablet Take 220 mg by mouth daily as needed.     DULoxetine (CYMBALTA) 30 MG capsule Take 1 capsule (30 mg total) by mouth daily. 90 capsule 1   No current facility-administered medications for this visit.     PHYSICAL EXAMINATION: ECOG PERFORMANCE STATUS: 1 - Symptomatic but completely ambulatory Vitals:   09/20/21 1000  BP: 123/80  Pulse: 67  Temp: (!) 97.2 F (36.2 C)   Filed Weights   09/20/21  1000  Weight: 174 lb 4.8 oz (79.1 kg)    Physical Exam Constitutional:      General: She is not in acute distress. HENT:     Head: Normocephalic and atraumatic.     Right Ear: External ear normal.  Eyes:     General: No scleral icterus.    Conjunctiva/sclera: Conjunctivae normal.     Pupils: Pupils are equal, round, and reactive to light.  Cardiovascular:     Rate and Rhythm: Normal rate and regular rhythm.     Heart sounds: Normal heart sounds.  Pulmonary:     Effort: Pulmonary effort is normal. No respiratory distress.     Breath sounds: Normal breath sounds. No wheezing or rales.  Chest:     Chest wall: No tenderness.  Abdominal:     General: Bowel sounds are normal. There is no distension.     Palpations: Abdomen is soft. There is no mass.     Tenderness: There is no abdominal tenderness.  Musculoskeletal:        General: No deformity. Normal range of motion.     Cervical back: Normal range of motion and neck supple.  Lymphadenopathy:     Cervical: No cervical adenopathy.  Skin:    General: Skin is warm and dry.     Findings: No erythema or rash.  Neurological:     Mental Status: She is alert and oriented to person, place, and time. Mental status is at baseline.     Cranial Nerves: No cranial nerve deficit.     Coordination: Coordination normal.     Deep Tendon Reflexes: Reflexes normal.  Psychiatric:        Mood and Affect: Mood normal.     LABORATORY DATA:  I have reviewed the data as listed Lab  Results  Component Value Date   WBC 7.1 09/20/2021   HGB 13.6 09/20/2021   HCT 41.0 09/20/2021   MCV 91.5 09/20/2021   PLT 229 09/20/2021   Recent Labs    09/22/20 0946 03/02/21 1046 03/19/21 0937 07/21/21 0813 09/20/21 0939  NA 139 139 139  --  139  K 4.3 4.2 3.9  --  4.2  CL 103 103 104  --  101  CO2 26 21 25   --  28  GLUCOSE 89 92 99  --  93  BUN 20 18 20   --  17  CREATININE 0.92 0.76 0.90 0.90 0.78  CALCIUM 9.0 9.2 9.0  --  9.1  GFRNONAA >60  --  >60  --  >60  PROT 7.1 6.6 7.0  --  7.3  ALBUMIN 3.8 4.3 3.8  --  3.8  AST 21 14 23   --  19  ALT 18 11 18   --  13  ALKPHOS 64 70 59  --  61  BILITOT 0.5 0.4 0.7  --  0.6    Iron/TIBC/Ferritin/ %Sat No results found for: IRON, TIBC, FERRITIN, IRONPCTSAT   RADIOGRAPHIC STUDIES: I have personally reviewed the radiological images as listed and agreed with the findings in the report. CT CHEST ABDOMEN PELVIS W CONTRAST  Result Date: 07/21/2021 CLINICAL DATA:  History of lung nodules. Restaging endometrial cancer, diagnosed 2019 EXAM: CT CHEST, ABDOMEN, AND PELVIS WITH CONTRAST TECHNIQUE: Multidetector CT imaging of the chest, abdomen and pelvis was performed following the standard protocol during bolus administration of intravenous contrast. CONTRAST:  144mL OMNIPAQUE IOHEXOL 350 MG/ML SOLN COMPARISON:  CT chest abdomen pelvis, 07/02/2020 and 03/30/2018. FINDINGS: CT CHEST FINDINGS Cardiovascular: No  significant vascular findings. Normal heart size. No pericardial effusion. Interval removal of RIGHT chest port. Mediastinum/Nodes: No enlarged mediastinal, hilar, or axillary lymph nodes. Asymmetric enlargement of the LEFT thyroid gland, without discrete lesion. Trachea and esophagus demonstrate no significant findings. Lungs/Pleura: No pleural effusion or pneumothorax. Central airways are patent. Minimal bibasilar atelectasis. New and persistent bilateral pleural and pulmonary nodules, including; *RIGHT upper lobe subsolid pulmonary nodule  measuring 0.5 cm (# 3, 33), previously 3 mm *Middle lobe nodule measures 0.5 cm (# 3, 77), previously 0.3 cm. *RIGHT lower lobe perifissural nodule measuring 0.6 cm (# 3, 82), unchanged *New posterior LEFT upper lobe subpleural nodules measuring 1.0 cm and 0.5 cm (# 3, 26 and 30) *LEFT lower lobe subpleural nodule measuring 0.5 cm, unchanged *LEFT basilar nodule with adjacent atelectasis, persistent since comparison measures 1.0 cm Musculoskeletal: No chest wall mass or suspicious bone lesions identified. CT ABDOMEN PELVIS FINDINGS Hepatobiliary: Similar appearance of multifocal hypodense hepatic lesions, consistent with cysts. Normal-sized liver and contour. Phyrigian cap. No gallstones, gallbladder wall thickening, or biliary dilatation. Pancreas: Unremarkable. No pancreatic ductal dilatation or surrounding inflammatory changes. Spleen: Normal in size without focal abnormality. Small perihilar accessory spleen. Adrenals/Urinary Tract: Adrenal glands are unremarkable. Kidneys are normal, without renal calculi, focal lesion, or hydronephrosis. Bladder is unremarkable. Stomach/Bowel: Enteral contrast opacification of stomach, small bowel and cecum. No extraluminal contrast extravasation. Stomach is within normal limits. Appendix is not definitely visualized. Nondistended small bowel. Nondilated colon. Mild descending colonic diverticulosis. No evidence of bowel wall thickening, distention, or inflammatory changes. Vascular/Lymphatic: Mild burden of aortic atherosclerosis. No enlarged abdominal or pelvic lymph nodes. Reproductive: Status post hysterectomy. No adnexal masses. Other: No abdominal wall hernia or abnormality. No abdominopelvic ascites. Musculoskeletal: No aggressive osseous lesion. Moderate degenerative changes of the spine, greatest within the lumbar spine at L4-5 and L5-S1, including disc change, endplate sclerosis and osteophytosis. IMPRESSION: Since CT CAP dated 07/02/2020; 1. New posterior LEFT upper  lobe subpleural nodules, largest measuring up to 1.0 cm (see key image). Attention on follow-up. Additional pulmonary nodules are similar to comparison. 2. No evidence to suggest metastatic disease within the chest, abdomen or pelvis. 3. Interval port removal. Additional chronic and senescent changes, as above. Electronically Signed   By: Michaelle Birks M.D.   On: 07/21/2021 09:36      ASSESSMENT & PLAN:  1. History of endometrial cancer   2. Lung nodule   Stage IA Uterine carcinosarcoma  Status post 6 cycles adjuvant carboplatinum AUC 5 and Taxol 135 mg/m. Clinically patient is doing very well Labs reviewed and discussed with patient CA125 has been monitored and has been stable.  Today's levels are pending Continue surveillance She alternates visits with me in a gynecology oncology every 6 months  #Lung nodules, patient is currently everyday smoker, 5 cigarettes daily.  Smoke cessation was discussed with patient.  New posterior left upper lobe subpleural nodule, largest measuring up to 1 cm.  Attention on follow-up.  Repeat CT chest without contrast in March 2023. # neuropathy continue Cymbalta.  Refills were sent to pharmacy . We spent sufficient time to discuss many aspect of care, questions were answered to patient's satisfaction. The patient knows to call the clinic with any problems questions or concerns.  Return of visit: 6 months Orders Placed This Encounter  Procedures   CT Chest Wo Contrast    Standing Status:   Future    Standing Expiration Date:   09/20/2022    Order Specific Question:   Preferred imaging  location?    Answer:   [facility]   CBC with Differential/Platelet    Standing Status:   Future    Standing Expiration Date:   09/20/2022   Comprehensive metabolic panel    Standing Status:   Future    Standing Expiration Date:   09/20/2022   CA 125    Standing Status:   Future    Standing Expiration Date:   09/20/2022     Earlie Server, MD, PhD Hematology  Oncology [place] [facility] Pager- [phone] 09/20/2021

## 2021-09-21 LAB — CA 125: Cancer Antigen (CA) 125: 9.1 U/mL (ref 0.0–38.1)

## 2021-12-15 ENCOUNTER — Other Ambulatory Visit: Payer: Self-pay

## 2021-12-15 ENCOUNTER — Ambulatory Visit (INDEPENDENT_AMBULATORY_CARE_PROVIDER_SITE_OTHER): Payer: Medicare Other

## 2021-12-15 VITALS — BP 110/60 | HR 97 | Temp 97.7°F | Wt 173.8 lb

## 2021-12-15 DIAGNOSIS — Z780 Asymptomatic menopausal state: Secondary | ICD-10-CM | POA: Diagnosis not present

## 2021-12-15 DIAGNOSIS — Z0000 Encounter for general adult medical examination without abnormal findings: Secondary | ICD-10-CM | POA: Diagnosis not present

## 2021-12-15 NOTE — Progress Notes (Signed)
Subjective:   Katherine Snyder is a 71 y.o. female who presents for Medicare Annual (Subsequent) preventive examination.  Review of Systems           Objective:    Today's Vitals   12/15/21 1111  BP: 110/60  Pulse: 97  Temp: 97.7 F (36.5 C)  TempSrc: Oral  SpO2: 98%  Weight: 173 lb 12.8 oz (78.8 kg)   Body mass index is 27.22 kg/m.  Advanced Directives 06/30/2021 03/19/2021 12/30/2020 12/09/2020 09/22/2020 06/24/2020 04/13/2020  Does Patient Have a Medical Advance Directive? No No No No No No No  Would patient like information on creating a medical advance directive? No - Patient declined - No - Patient declined No - Patient declined - No - Patient declined No - Patient declined    Current Medications (verified) Outpatient Encounter Medications as of 12/15/2021  Medication Sig   betamethasone dipropionate 0.05 % cream Apply 1 application topically 2 (two) times daily as needed (for psoriasis on body).   desonide (DESOWEN) 0.05 % cream Apply 1 application topically 2 (two) times daily as needed (for psoriasis on face).   DULoxetine (CYMBALTA) 30 MG capsule Take 1 capsule (30 mg total) by mouth daily.   Multiple Vitamins-Minerals (CENTRUM SILVER ADULT 50+ PO) Take 1 tablet by mouth daily.   naproxen sodium (ALEVE) 220 MG tablet Take 220 mg by mouth daily as needed.   No facility-administered encounter medications on file as of 12/15/2021.    Allergies (verified) Patient has no known allergies.   History: Past Medical History:  Diagnosis Date   Arthritis    Cancer ([place]) 2019   endometrial   Endometrial mass    History of chicken pox    History of kidney stones    History of measles    History of mumps    Osteoporosis    Personal history of chemotherapy    Personal history of radiation therapy    PMB (postmenopausal bleeding)    Psoriasis    Past Surgical History:  Procedure Laterality Date   APPENDECTOMY  1960   BREAST EXCISIONAL BIOPSY Left 1980   neg   BREAST  SURGERY Left    biopsy   COLONOSCOPY WITH PROPOFOL N/A 03/13/2020   Procedure: COLONOSCOPY WITH PROPOFOL;  Surgeon: Jonathon Bellows, MD;  Location: [facility] ENDOSCOPY;  Service: Gastroenterology;  Laterality: N/A;   Injection varicose veins in legs Bilateral 2010   Dr. Hulda Humphrey   LITHOTRIPSY  1994, 2004   for renal stones   PORTA CATH INSERTION N/A 04/24/2018   Procedure: PORTA CATH INSERTION;  Surgeon: Katha Cabal, MD;  Location: [place] [facility];  Service: Cardiovascular;  Laterality: N/A;   PORTA CATH REMOVAL N/A 01/18/2021   Procedure: PORTA CATH REMOVAL;  Surgeon: Algernon Huxley, MD;  Location: [place] [facility];  Service: Cardiovascular;  Laterality: N/A;   ROBOTIC ASSISTED TOTAL HYSTERECTOMY Bilateral 04/04/2018   Procedure: ROBOTIC ASSISTED TOTAL HYSTERECTOMY,SENTINEL LYMPH NODE MAPPING AND BIOPSIES,AORTIC LYMPH NODE DISSECTION;  Surgeon: Gillis Ends, MD;  Location: [facility] ORS;  Service: Gynecology;  Laterality: Bilateral;   TUBAL LIGATION  1980   Family History  Problem Relation Age of Onset   Breast cancer Mother    Transient ischemic attack Mother    Arthritis Mother    Hypothyroidism Mother    Lung cancer Mother    Diabetes Father        type 2   Hypertension Father    Arthritis Father    Colon  cancer Sister    Prostate cancer Brother    Hyperlipidemia Son    Stomach cancer Maternal Grandmother    Stroke Maternal Grandfather    Social History   Socioeconomic History   Marital status: Divorced    Spouse name: single   Number of children: 1   Years of education: Not on file   Highest education level: 10th grade  Occupational History   Occupation: retired  Tobacco Use   Smoking status: Every Day    Packs/day: 0.25    Years: 30.00    Pack years: 7.50    Types: Cigarettes   Smokeless tobacco: Never  Vaping Use   Vaping Use: Some days  Substance and Sexual Activity   Alcohol use: No    Alcohol/week: 0.0 standard drinks   Drug use: No   Sexual  activity: Not Currently  Other Topics Concern   Not on file  Social History Narrative   Not on file   Social Determinants of Health   Financial Resource Strain: Not on file  Food Insecurity: Not on file  Transportation Needs: Not on file  Physical Activity: Not on file  Stress: Not on file  Social Connections: Not on file    Tobacco Counseling Ready to quit: Not Answered Counseling given: Not Answered   Clinical Intake:  Pre-visit preparation completed: Yes  Pain : No/denies pain     Nutritional Risks: None Diabetes: No  How often do you need to have someone help you when you read instructions, pamphlets, or other written materials from your doctor or pharmacy?: 1 - Never  Diabetic?no  Interpreter Needed?: No  Information entered by :: Kirke Shaggy, LPN   Activities of Daily Living In your present state of health, do you have any difficulty performing the following activities: 03/02/2021 01/18/2021  Hearing? N N  Vision? N N  Difficulty concentrating or making decisions? N N  Walking or climbing stairs? N Y  Dressing or bathing? N N  Doing errands, shopping? N -  Some recent data might be hidden    Patient Care Team: Gwyneth Sprout, FNP as PCP - General (Family Medicine) Mellody Drown, MD as Referring Physician (Obstetrics and Gynecology) Clent Jacks, RN as Oncology Nurse Navigator Noreene Filbert, MD as Referring Physician (Radiation Oncology) Earlie Server, MD as Consulting Physician (Oncology) [place], [place] (Optometry)  Indicate any recent Medical Services you may have received from other than [facility] providers in the past year (date may be approximate).     Assessment:   This is a routine wellness examination for [place].  Hearing/Vision screen No results found.  Dietary issues and exercise activities discussed:     Goals Addressed   None    Depression Screen PHQ 2/9 Scores 03/02/2021 12/09/2020 02/25/2020 01/28/2019 05/13/2016  PHQ - 2  Score 0 0 0 0 0  PHQ- 9 Score 0 - - 0 -    Fall Risk Fall Risk  03/02/2021 12/09/2020 02/25/2020 01/28/2019  Falls in the past year? 0 0 0 0  Number falls in past yr: 0 0 - -  Injury with Fall? 0 0 - -  Risk for fall due to : No Fall Risks - - -  Follow up Falls evaluation completed - - -    FALL RISK PREVENTION PERTAINING TO THE HOME:  Any stairs in or around the home? Yes  If so, are there any without handrails? No  Home free of loose throw rugs in walkways, pet beds, electrical  cords, etc? Yes  Adequate lighting in your home to reduce risk of falls? Yes   ASSISTIVE DEVICES UTILIZED TO PREVENT FALLS:  Life alert? No  Use of a cane, walker or w/c? No  Grab bars in the bathroom? Yes  Shower chair or bench in shower? Yes  Elevated toilet seat or a handicapped toilet? No   TIMED UP AND GO:  Was the test performed? Yes .  Length of time to ambulate 10 feet: 4 sec.   Gait steady and fast without use of assistive device  Cognitive Function:     6CIT Screen 12/09/2020  What Year? 0 points  What month? 0 points  What time? 0 points  Count back from 20 0 points  Months in reverse 0 points  Repeat phrase 0 points  Total Score 0    Immunizations Immunization History  Administered Date(s) Administered   Influenza, High Dose Seasonal PF 08/03/2019, 08/07/2020   Influenza-Unspecified 08/16/2016   Moderna Sars-Covid-2 Vaccination 07/20/2020, 08/17/2020, 02/10/2021   Pneumococcal Conjugate-13 01/28/2019   Pneumococcal Polysaccharide-23 10/08/2009, 12/09/2020   Td 01/28/2019   Tdap 10/08/2009    TDAP status: Up to date  Flu Vaccine status: Up to date  Pneumococcal vaccine status: Up to date  Covid-19 vaccine status: Information provided on how to obtain vaccines.   Qualifies for Shingles Vaccine? Yes   Zostavax completed No   Shingrix Completed?: No.    Education has been provided regarding the importance of this vaccine. Patient has been advised to call insurance  company to determine out of pocket expense if they have not yet received this vaccine. Advised may also receive vaccine at local pharmacy or Health Dept. Verbalized acceptance and understanding.  Screening Tests Health Maintenance  Topic Date Due   Zoster Vaccines- Shingrix (1 of 2) Never done   DEXA SCAN  12/09/2016   COVID-19 Vaccine (4 - Booster for Moderna series) 04/07/2021   INFLUENZA VACCINE  06/07/2021   MAMMOGRAM  03/10/2023   COLONOSCOPY (Pts 45-31yrs Insurance coverage will need to be confirmed)  03/13/2025   TETANUS/TDAP  01/27/2029   Pneumonia Vaccine 94+ Years old  Completed   Hepatitis C Screening  Completed   HPV VACCINES  Aged Out    Health Maintenance  Health Maintenance Due  Topic Date Due   Zoster Vaccines- Shingrix (1 of 2) Never done   DEXA SCAN  12/09/2016   COVID-19 Vaccine (4 - Booster for Moderna series) 04/07/2021   INFLUENZA VACCINE  06/07/2021    Colorectal cancer screening: Type of screening: Colonoscopy. Completed 03/13/20. Repeat every 5 years  Mammogram status: Completed 03/09/21. Repeat every year  Bone Density status: Completed 12/09/14. Results reflect: Bone density results: OSTEOPOROSIS. Repeat every 2 years.- referral sent  Lung Cancer Screening: (Low Dose CT Chest recommended if Age 26-80 years, 30 pack-year currently smoking OR have quit w/in 15years.) does not qualify.     Additional Screening:  Hepatitis C Screening: does qualify; Completed 11/05/12  Vision Screening: Recommended annual ophthalmology exams for early detection of glaucoma and other disorders of the eye. Is the patient up to date with their annual eye exam?  Yes  Who is the provider or what is the name of the office in which the patient attends annual eye exams? [facility] If pt is not established with a provider, would they like to be referred to a provider to establish care? No .   Dental Screening: Recommended annual dental exams for proper oral  hygiene  Community  Resource Referral / Chronic Care Management: CRR required this visit?  No   CCM required this visit?  No      Plan:     I have personally reviewed and noted the following in the patients chart:   Medical and social history Use of alcohol, tobacco or illicit drugs  Current medications and supplements including opioid prescriptions.  Functional ability and status Nutritional status Physical activity Advanced directives List of other physicians Hospitalizations, surgeries, and ER visits in previous 12 months Vitals Screenings to include cognitive, depression, and falls Referrals and appointments  In addition, I have reviewed and discussed with patient certain preventive protocols, quality metrics, and best practice recommendations. A written personalized care plan for preventive services as well as general preventive health recommendations were provided to patient.     Dionisio David, LPN   02/13/7025   Nurse Notes: none

## 2021-12-29 ENCOUNTER — Inpatient Hospital Stay: Payer: Medicare Other | Attending: Obstetrics and Gynecology | Admitting: Nurse Practitioner

## 2021-12-29 ENCOUNTER — Other Ambulatory Visit: Payer: Self-pay

## 2021-12-29 VITALS — BP 123/67 | HR 66 | Temp 96.2°F | Resp 19 | Wt 175.0 lb

## 2021-12-29 DIAGNOSIS — Z90722 Acquired absence of ovaries, bilateral: Secondary | ICD-10-CM | POA: Diagnosis not present

## 2021-12-29 DIAGNOSIS — Z90710 Acquired absence of both cervix and uterus: Secondary | ICD-10-CM | POA: Diagnosis not present

## 2021-12-29 DIAGNOSIS — M722 Plantar fascial fibromatosis: Secondary | ICD-10-CM | POA: Insufficient documentation

## 2021-12-29 DIAGNOSIS — Z923 Personal history of irradiation: Secondary | ICD-10-CM | POA: Insufficient documentation

## 2021-12-29 DIAGNOSIS — Z9221 Personal history of antineoplastic chemotherapy: Secondary | ICD-10-CM | POA: Insufficient documentation

## 2021-12-29 DIAGNOSIS — R600 Localized edema: Secondary | ICD-10-CM | POA: Insufficient documentation

## 2021-12-29 DIAGNOSIS — Z8542 Personal history of malignant neoplasm of other parts of uterus: Secondary | ICD-10-CM | POA: Diagnosis present

## 2021-12-29 DIAGNOSIS — Z87891 Personal history of nicotine dependence: Secondary | ICD-10-CM | POA: Insufficient documentation

## 2021-12-29 DIAGNOSIS — R918 Other nonspecific abnormal finding of lung field: Secondary | ICD-10-CM | POA: Diagnosis not present

## 2021-12-29 DIAGNOSIS — C55 Malignant neoplasm of uterus, part unspecified: Secondary | ICD-10-CM | POA: Diagnosis not present

## 2021-12-29 NOTE — Progress Notes (Signed)
Gynecologic Oncology Interval Visit   Referring Provider: Dr. Amalia Hailey  Chief Complaint: Endometrial Cancer  Subjective:  Katherine Snyder is a 71 y.o. 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  She is scheduled for Chest CT on 01/17/2022 ordered by Dr. Tasia Catchings. She had a CT C/A/P on 07/21/2021 which showed the following:    1. New posterior LEFT upper lobe subpleural nodules, largest measuring up to 1.0 cm (see key image). Attention on follow-up. Additional pulmonary nodules are similar to comparison. 2. No evidence to suggest metastatic disease within the chest, abdomen or pelvis. 3. Interval port removal. Additional chronic and senescent changes, as above.   H/o chronic leg swelling which is no worse. She is not seeing Dr. Amalia Hailey at this time. She is following up with Dr. Tasia Catchings 09/20/21 and with Dr. Baruch Gouty 04/01/22.    GYNECOLOGIC ONCOLOGY HISTORY:  Initially, patient presented to ER on 02/26/18 for PMB. She was reportedly 14 years post menopausal and had had a 2 day episode of vaginal bleeding that was constant.   02/26/18- Ultrasound Pelvic Complete w/ Transvaginal: Endometrium thickness up to 48mm. No focal solid or cystic change. IMPRESSION: Mildly enlarged uterus. No discrete fibroids. Markedly thickened endometrium. In the setting of post-menopausal bleeding, endometrial sampling is indicated to exclude carcinoma. If results are benign, sonohysterogram should be considered for focal lesion work-up. (Ref: Radiological Reasoning: Algorithmic Workup of Abnormal Vaginal Bleeding with Endovaginal Sonography and Sonohysterography. AJR 2008; 323:F57-32) Nonvisualization of the right ovary. Normal-appearing left ovary. No significant uterine fibroids were observed.  She was seen by Dr. Ellard Artis son  03/01/18 who performed vacurette endometrial biopsy. Uterus was sounded to length to 9 cm. She was started on Aygestin and bleeding stopped.   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  COMMENT: BASED ON INITIAL HISTOLOGIC FINDINGS, IMMUNOHISTOCHEMISTRY IS EVALUATED:  THE SARCOMATOUS TUMOR COMPONENT IS DIFFUSELY POSITIVE FOR CD10 AND THE CARCINOMA COMPONENT IS POSITIVE FOR PANKERATIN, SUPPORTING THE DIAGNOSIS.   She was seen by Dr. Fransisca Connors, Gyn-Onc on 03/28/2018 for initial evaluation and treatment planning.  TLH BSO with sentinel lymph node mapping and biopsies and aortic lymph node dissection were discussed.  Surgery at [facility] was recommended but unfortunately, patient's insurance will not cover.   CA 125- 10.5  03/30/18 - CT C/A/P W CONTRAST IMPRESSION: 1. Mass like expansion of the endometrial cavity known primary endometrial carcinoma. 2. No evidence for nodal metastasis or solid organ metastasis within the abdomen or pelvis. 3. Scattered nonspecific solid nodules and a single part solid nodule identified in both lungs. Consensus criteria recommendations for nodule followup does not apply to patients with known primary malignancy. 4. Liver cysts.  She underwent CT Imaging and then robot assisted total hysterectomy with sentinel LN mapping, biopsies, and aortic LN dissection with Dr. Theora Gianotti at [facility] 04/04/18.   A. UTERUS WITH CERVIX; HYSTERECTOMY:  - CARCINOSARCOMA.  - MYOMETRIAL INVASION PRESENT, LESS THAN HALF OF THE MYOMETRIUM.  - NEGATIVE FOR CERVICAL AND SEROSAL INVOLVEMENT.  - SUBMUCOSAL LEIOMYOMA.   FALLOPIAN TUBE AND OVARY, LEFT; SALPINGO-OOPHORECTOMY:  - NEGATIVE FOR MALIGNANCY.  - OVARIAN CORTICAL INCLUSION CYSTS.  - ATROPHIC FALLOPIAN TUBE.   B. SENTINEL LYMPH NODE, RIGHT PRIMARY OBTURATOR; EXCISION:  - NEGATIVE FOR MALIGNANCY, ONE LYMPH NODE (0/1).  C. LYMPH NODE, RIGHT EXTERNAL ILIAC VEIN; EXCISION:  - NEGATIVE FOR MALIGNANCY, ONE LYMPH NODE (0/1).   D. FALLOPIAN TUBE AND OVARY, UNILATERAL (RIGHT); SALPINGO-OOPHORECTOMY:  - NEGATIVE FOR MALIGNANCY.  - OVARIAN CORTICAL INCLUSION CYSTS.  - ATROPHIC FALLOPIAN TUBE.   E. SENTINEL LYMPH NODE, RIGHT LOW PARA-AORTIC; EXCISION:  - NEGATIVE FOR MALIGNANCY, TWO LYMPH NODES (0/2).   F. SENTINEL LYMPH NODE, LEFT PRIMARY LOW PARA-AORTIC; EXCISION:  - NEGATIVE FOR MALIGNANCY, ONE LYMPH NODE (0/1).   DIAGNOSIS:  A.  PELVIC WASHINGS:  - NEGATIVE FOR MALIGNANCY.    Her case was discussed at [place] and discussed that recurrence risk without adjuvant treatment is up to 50%.  So recommend adjuvant chemo and radiation.  She was seen by Dr. Baruch Gouty, for initial evaluation of stage IA uterine carcinosarcoma and plan is for brachytherapy after 6 cycles of carbo/taxol.    She initiated carbo-Taxol on 04/25/2018.  Completed 6 cycles on 08/08/2018.  She received vaginal brachytherapy with Dr. Baruch Gouty 10/19-11/09/2018.   She had thyroid ultrasound for incidental thyroid nodule seen on surveillance imaging for indeterminate pulmonary nodules on 5/20. Biopsy was benign.   07/11/18- CT Chest WO Contrast for follow-up on pulmonary nodules IMPRESSION: 1. Previously visualized pulmonary nodules are either stable or resolved. Several new subcentimeter bilateral pulmonary nodules. Pulmonary nodularity generally appears centrilobular in distribution and while considered indeterminate is more suggestive of infectious/inflammatory nodularity. Continued chest CT surveillance is recommended in 3-6 months. 2. No thoracic adenopathy.  12/10/2018- CT Chest wo contrast - New sub solid density measuring 12 mm with associated 3 mm solid nodule is noted in left upper lobe. There also noted several other new nodules noted more posteriorly in the left upper lobe, with the largest measuring 3 mm. These may simply be  inflammatory in etiology, but neoplasm can not be excluded. Initial follow-up by chest CT without contrast is recommended in 3 months to confirm persistence. - Stable sub solid density is noted in right upper lobe with grossly stable 4 mm solid nodule. Multiple other pulmonary nodules are noted in both lungs which are stable compared to prior exam - Small nonobstructive right renal calculus - Stable left thyroid nodule - Stable hepatic cyst  She stopped smoking 2/20, but occasionally using vaping products.    CT scan chest 5/20 IMPRESSION: 1. Stable scattered pulmonary nodules as detailed above including a 1.2 cm part solid nodule in the right upper lobe. The solid component remains stable at approximately 0.4 cm. Follow-up non-contrast CT recommended at 3-6 months to confirm persistence. If unchanged, and solid component remains <6 mm, annual CT is recommended until 5 years of stability has been established. If persistent these nodules should be considered highly suspicious if the solid component of the nodule is 6 mm or greater in size and enlarging. This recommendation follows the consensus statement: Guidelines for Management of Incidental Pulmonary Nodules Detected on CT Images: From the Fleischner Society 2017; Radiology 2017; 284:228-243. 2. Additional scattered pulmonary nodules as detailed above measuring up to approximately 8 mm. These are stable from prior study. Attention on yearly follow-up examinations is recommended. 3. Bilateral nonobstructing renal nephroliths are again noted.  4. Mild bilateral interlobular septal thickening consistent with mild volume overload.  CT scan 8/21 IMPRESSION: 1. No acute intrathoracic, abdominal, or pelvic pathology. 2. No evidence of metastatic disease in the chest, abdomen, or pelvis. 3. Bilateral pulmonary nodules similar to prior CT. 4. Small nonobstructing right renal interpolar calculus. 5. Colonic diverticulosis. No bowel obstruction.  6.  Aortic Atherosclerosis (ICD10-I70.0).  Primary care Colonoscopy on 03/13/20 with benign polyps and she will repeat in 5 years. Mammogram 03/09/21 was negative.   Problem List: Patient Active Problem List   Diagnosis Date Noted   Neuropathy 05/16/2018   Fatigue 05/16/2018   Inflammatory heel pain, right 05/16/2018   Encounter for antineoplastic chemotherapy 05/16/2018   Drug-induced neutropenia ([place]) 05/09/2018   Goals of care, counseling/discussion 04/18/2018   Endometrial cancer ([place])    Seborrhea 05/13/2016   History of kidney stones 06/26/2015   OP (osteoporosis) 06/26/2015   Plantar fasciitis 06/26/2015   Current tobacco use 06/26/2015   Kidney stone 06/21/2013   Chronic airway obstruction ([place]) 10/08/2009   Psoriasis 09/30/2008   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   Menopausal symptom 09/21/2007   Tobacco use 09/21/2007   Awareness of heartbeats 09/21/2007    Past Medical History: Past Medical History:  Diagnosis Date   Arthritis    Cancer ([place]) 2019   endometrial   Endometrial mass    History of chicken pox    History of kidney stones    History of measles    History of mumps    Osteoporosis    Personal history of chemotherapy    Personal history of radiation therapy    PMB (postmenopausal bleeding)    Psoriasis     Past Surgical History: Past Surgical History:  Procedure Laterality Date   APPENDECTOMY  1960   BREAST EXCISIONAL BIOPSY Left 1980   neg   BREAST SURGERY Left    biopsy   COLONOSCOPY WITH PROPOFOL N/A 03/13/2020   Procedure: COLONOSCOPY WITH PROPOFOL;  Surgeon: Jonathon Bellows, MD;  Location: [facility] ENDOSCOPY;  Service: Gastroenterology;  Laterality: N/A;   Injection varicose veins in legs Bilateral 2010   Dr. Hulda Humphrey   LITHOTRIPSY  1994, 2004   for renal stones   PORTA CATH INSERTION N/A 04/24/2018   Procedure: PORTA CATH INSERTION;  Surgeon: Katha Cabal, MD;  Location: [place] CV LAB;  Service: Cardiovascular;   Laterality: N/A;   PORTA CATH REMOVAL N/A 01/18/2021   Procedure: PORTA CATH REMOVAL;  Surgeon: Algernon Huxley, MD;  Location: [place] CV LAB;  Service: Cardiovascular;  Laterality: N/A;   ROBOTIC ASSISTED TOTAL HYSTERECTOMY Bilateral 04/04/2018   Procedure: ROBOTIC ASSISTED TOTAL HYSTERECTOMY,SENTINEL LYMPH NODE MAPPING AND BIOPSIES,AORTIC LYMPH NODE DISSECTION;  Surgeon: Gillis Ends, MD;  Location: [facility] ORS;  Service: Gynecology;  Laterality: Bilateral;   TUBAL LIGATION  1980    OB History:  OB History  Gravida Para Term Preterm AB Living  1 1 1     1   SAB IAB Ectopic Multiple Live Births          1    # Outcome Date GA Lbr Len/2nd Weight Sex Delivery Anes PTL Lv  1 Term 74    M Vag-Spont   LIV  [id number] LMP:   Family History: Family History  Problem Relation Age of Onset   Breast cancer Mother    Transient ischemic attack Mother    Arthritis Mother    Hypothyroidism Mother    Lung cancer Mother    Diabetes Father        type 2   Hypertension Father    Arthritis Father    Colon cancer Sister    Prostate cancer Brother    Hyperlipidemia Son    Stomach cancer Maternal Grandmother    Stroke Maternal Grandfather     Social History: Social History  Socioeconomic History   Marital status: Divorced    Spouse name: single   Number of children: 1   Years of education: Not on file   Highest education level: 10th grade  Occupational History   Occupation: retired  Tobacco Use   Smoking status: Every Day    Packs/day: 0.25    Years: 30.00    Pack years: 7.50    Types: Cigarettes   Smokeless tobacco: Never  Vaping Use   Vaping Use: Some days  Substance and Sexual Activity   Alcohol use: No    Alcohol/week: 0.0 standard drinks   Drug use: No   Sexual activity: Not Currently  Other Topics Concern   Not on file  Social History Narrative   Not on file   Social Determinants of Health   Financial Resource Strain: Low Risk    Difficulty of Paying  Living Expenses: Not hard at all  Food Insecurity: No Food Insecurity   Worried About Charity fundraiser in the Last Year: Never true   Ran Out of Food in the Last Year: Never true  Transportation Needs: No Transportation Needs   Lack of Transportation (Medical): No   Lack of Transportation (Non-Medical): No  Physical Activity: Inactive   Days of Exercise per Week: 0 days   Minutes of Exercise per Session: 0 min  Stress: Stress Concern Present   Feeling of Stress : To some extent  Social Connections: Socially Isolated   Frequency of Communication with Friends and Family: Twice a week   Frequency of Social Gatherings with Friends and Family: Three times a week   Attends Religious Services: Never   Active Member of Clubs or Organizations: No   Attends Archivist Meetings: Never   Marital Status: Divorced  Human resources officer Violence: Not At Risk   Fear of Current or Ex-Partner: No   Emotionally Abused: No   Physically Abused: No   Sexually Abused: No    Allergies: No Known Allergies  Current Medications: Current Outpatient Medications  Medication Sig Dispense Refill   betamethasone dipropionate 0.05 % cream Apply 1 application topically 2 (two) times daily as needed (for psoriasis on body). 30 g 1   desonide (DESOWEN) 0.05 % cream Apply 1 application topically 2 (two) times daily as needed (for psoriasis on face). 30 g 1   DULoxetine (CYMBALTA) 30 MG capsule Take 1 capsule (30 mg total) by mouth daily. 90 capsule 1   Multiple Vitamins-Minerals (CENTRUM SILVER ADULT 50+ PO) Take 1 tablet by mouth daily.     naproxen sodium (ALEVE) 220 MG tablet Take 220 mg by mouth daily as needed.     No current facility-administered medications for this visit.   Review of Systems General:  no complaints Skin: no complaints Eyes: no complaints HEENT: no complaints Breasts: no complaints Pulmonary: no complaints Cardiac: no complaints Gastrointestinal: no  complaints Genitourinary/Sexual: no complaints Ob/Gyn: no complaints Musculoskeletal: no complaints Hematology: no complaints Neurologic/Psych: no complaints   Objective:  Physical Examination:  Vitals:   12/29/21 1045  BP: 123/67  Pulse: 66  Resp: 19  Temp: (!) 96.2 F (35.7 C)  SpO2: 97%    ECOG Performance Status: 0  GENERAL: Patient is a well appearing female in no acute distress HEENT:  Atraumatic and normocephalic. PERRL, neck supple. NODES:  No cervical, supraclavicular, axillary, or inguinal lymphadenopathy palpated.  LUNGS:  Clear to auscultation bilaterally.  No wheezes. HEART:  Regular rate and rhythm.  ABDOMEN:  Soft, nontender. Nondistended. No  masses/ascites/hernia/or hepatomegaly.  EXTREMITIES:  No peripheral edema.   SKIN:  Clear with no obvious rashes or skin changes. No nail dyscrasia. NEURO:  Nonfocal. Well oriented.  Appropriate affect.  Pelvic: Exam chaperoned by CMA EGBUS: no lesions Cervix: surgically absent Vagina: no lesions, no discharge or bleeding Uterus: surgically absent BME: no palpable masses Rectovaginal: deferred  Radiology:   07/21/2021 CT CHEST, ABDOMEN, AND PELVIS WITH CONTRAST   TECHNIQUE: Multidetector CT imaging of the chest, abdomen and pelvis was performed following the standard protocol during bolus administration of intravenous contrast.   CONTRAST:  167mL OMNIPAQUE IOHEXOL 350 MG/ML SOLN   COMPARISON:  CT chest abdomen pelvis, 07/02/2020 and 03/30/2018.   FINDINGS: CT CHEST FINDINGS   Cardiovascular: No significant vascular findings. Normal heart size. No pericardial effusion. Interval removal of RIGHT chest port.   Mediastinum/Nodes: No enlarged mediastinal, hilar, or axillary lymph nodes. Asymmetric enlargement of the LEFT thyroid gland, without discrete lesion. Trachea and esophagus demonstrate no significant findings.   Lungs/Pleura: No pleural effusion or pneumothorax. Central airways are patent. Minimal  bibasilar atelectasis.   New and persistent bilateral pleural and pulmonary nodules, including;   *RIGHT upper lobe subsolid pulmonary nodule measuring 0.5 cm (# 3, 33), previously 3 mm *Middle lobe nodule measures 0.5 cm (# 3, 77), previously 0.3 cm. *RIGHT lower lobe perifissural nodule measuring 0.6 cm (# 3, 82), unchanged *New posterior LEFT upper lobe subpleural nodules measuring 1.0 cm and 0.5 cm (# 3, 26 and 30) *LEFT lower lobe subpleural nodule measuring 0.5 cm, unchanged *LEFT basilar nodule with adjacent atelectasis, persistent since comparison measures 1.0 cm   Musculoskeletal: No chest wall mass or suspicious bone lesions identified.   CT ABDOMEN PELVIS FINDINGS   Hepatobiliary: Similar appearance of multifocal hypodense hepatic lesions, consistent with cysts. Normal-sized liver and contour. Phyrigian cap. No gallstones, gallbladder wall thickening, or biliary dilatation.   Pancreas: Unremarkable. No pancreatic ductal dilatation or surrounding inflammatory changes.   Spleen: Normal in size without focal abnormality. Small perihilar accessory spleen.   Adrenals/Urinary Tract: Adrenal glands are unremarkable. Kidneys are normal, without renal calculi, focal lesion, or hydronephrosis. Bladder is unremarkable.   Stomach/Bowel: Enteral contrast opacification of stomach, small bowel and cecum. No extraluminal contrast extravasation. Stomach is within normal limits. Appendix is not definitely visualized. Nondistended small bowel. Nondilated colon. Mild descending colonic diverticulosis. No evidence of bowel wall thickening, distention, or inflammatory changes.   Vascular/Lymphatic: Mild burden of aortic atherosclerosis. No enlarged abdominal or pelvic lymph nodes.   Reproductive: Status post hysterectomy. No adnexal masses.   Other: No abdominal wall hernia or abnormality. No abdominopelvic ascites.   Musculoskeletal: No aggressive osseous lesion. Moderate  degenerative changes of the spine, greatest within the lumbar spine at L4-5 and L5-S1, including disc change, endplate sclerosis and osteophytosis.   IMPRESSION: Since CT CAP dated 07/02/2020;   1. New posterior LEFT upper lobe subpleural nodules, largest measuring up to 1.0 cm (see key image). Attention on follow-up. Additional pulmonary nodules are similar to comparison. 2. No evidence to suggest metastatic disease within the chest, abdomen or pelvis. 3. Interval port removal. Additional chronic and senescent changes, as above.     Assessment:  Katherine Snyder is a 71 y.o. female diagnosed with stage IA uterine carcinosarcoma with RA TLH/BSO, negative staging 5/19 followed by vaginal brachytherapy and 6 cycles of carbo/taxol chemotherapy completed 08/08/2018.  NED on exam today.     Chest CTs have  shown indeterminate lung nodules, some disapearing over time and some  new thought to be benign.  She is a current smoker.   Hepatic cyst, stable on CT.   H/o nonobstructing kidney stones on CT   Comorbidities complicating care: smoker, plantar fasciitis  Plan:   Problem List Items Addressed This Visit   None  Continue to follow up with Dr. Tasia Catchings and chest CT results.   Previously reviewed NCCN guidelines for surveillance including physical exam every 3-6 months for 2- 3 years then every 6 months for years 4-5 then annually thereafter. She will be 4 years post completion of treatment in October. We will alternate with Dr. Tasia Catchings who will see her in 3 months and we will see her back in 6 months. She will also be seeing Dr. Baruch Gouty in 03/2022. Patient prefers surveillance at [facility] at this time. Can consider alternating with Dr. Amalia Hailey in the future.   She agrees with this plan.   A total of 25 minutes were spent with the patient/family today; >50% was spent in education, counseling and coordination of care for h/o endometrial cancer, pulmonary nodules, smoking cessation, hepatic cysts, and  kidney stones.   Verlon Au, NP

## 2021-12-29 NOTE — Progress Notes (Signed)
Gynecologic Oncology Interval Visit   Referring Provider: Dr. Amalia Hailey  Chief Complaint: Endometrial Cancer  Subjective:  Katherine Snyder is a 71 y.o. 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  She is scheduled for Chest CT on 01/17/2022 ordered by Dr. Tasia Catchings. She had a CT C/A/P on 07/21/2021 which showed the following:    1. New posterior LEFT upper lobe subpleural nodules, largest measuring up to 1.0 cm (see key image). Attention on follow-up. Additional pulmonary nodules are similar to comparison. 2. No evidence to suggest metastatic disease within the chest, abdomen or pelvis. 3. Interval port removal. Additional chronic and senescent changes, as above.  Today, she continues to feel well and denies bleeding, pain, changes in bowel movements or new incontinence. She has chronic leg swelling which is stable and unchanged. She is not seeing Dr. Amalia Hailey at this time and prefers surveillance at the cancer center. She had follow up with Dr. Tasia Catchings in November 2022 and has pelvic exam with Dr. Baruch Gouty in May 2023.     GYNECOLOGIC ONCOLOGY HISTORY:  Initially, patient presented to ER on 02/26/18 for PMB. She was reportedly 14 years post menopausal and had had a 2 day episode of vaginal bleeding that was constant.   02/26/18- Ultrasound Pelvic Complete w/ Transvaginal: Endometrium thickness up to 6mm. No focal solid or cystic change. IMPRESSION: Mildly enlarged uterus. No discrete fibroids. Markedly thickened endometrium. In the setting of post-menopausal bleeding, endometrial sampling is indicated to exclude carcinoma. If results are benign, sonohysterogram should be considered for focal lesion work-up. (Ref: Radiological Reasoning: Algorithmic Workup of Abnormal Vaginal Bleeding with Endovaginal  Sonography and Sonohysterography. AJR 2008; 948:N46-27) Nonvisualization of the right ovary. Normal-appearing left ovary. No significant uterine fibroids were observed.  She was seen by Dr. Ellard Artis son 03/01/18 who performed vacurette endometrial biopsy. Uterus was sounded to length to 9 cm. She was started on Aygestin and bleeding stopped.   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  COMMENT: BASED ON INITIAL HISTOLOGIC FINDINGS, IMMUNOHISTOCHEMISTRY IS EVALUATED:  THE SARCOMATOUS TUMOR COMPONENT IS DIFFUSELY POSITIVE FOR CD10 AND THE CARCINOMA COMPONENT IS POSITIVE FOR PANKERATIN, SUPPORTING THE DIAGNOSIS.   She was seen by Dr. Fransisca Connors, Gyn-Onc on 03/28/2018 for initial evaluation and treatment planning.  TLH BSO with sentinel lymph node mapping and biopsies and aortic lymph node dissection were discussed.  [facility]gery at [facility] was recommended but unfortunately, patient's insurance will not cover.   CA 125- 10.5  03/30/18 - CT C/A/P W CONTRAST IMPRESSION: 1. Mass like expansion of the endometrial cavity known primary endometrial carcinoma. 2. No evidence for nodal metastasis or solid organ metastasis within the abdomen or pelvis. 3. Scattered nonspecific solid nodules and a single part solid nodule identified in both lungs. Consensus criteria recommendations for nodule followup does not apply to patients with known primary malignancy. 4. Liver cysts.  She underwent CT Imaging and then robot assisted total hysterectomy with sentinel LN mapping, biopsies, and aortic LN dissection with Dr. Theora Gianotti at [facility] 04/04/18.   A. UTERUS WITH CERVIX; HYSTERECTOMY:  - CARCINOSARCOMA.  - MYOMETRIAL INVASION PRESENT, LESS THAN HALF OF THE MYOMETRIUM.  - NEGATIVE FOR CERVICAL AND SEROSAL INVOLVEMENT.  - SUBMUCOSAL LEIOMYOMA.   FALLOPIAN TUBE AND OVARY, LEFT; SALPINGO-OOPHORECTOMY:  - NEGATIVE  FOR MALIGNANCY.  - OVARIAN CORTICAL INCLUSION CYSTS.  - ATROPHIC FALLOPIAN TUBE.   B. SENTINEL LYMPH NODE, RIGHT PRIMARY OBTURATOR; EXCISION:  - NEGATIVE FOR MALIGNANCY, ONE LYMPH NODE (0/1).   C. LYMPH NODE, RIGHT EXTERNAL ILIAC VEIN; EXCISION:  - NEGATIVE FOR MALIGNANCY, ONE LYMPH NODE (0/1).   D. FALLOPIAN TUBE AND OVARY, UNILATERAL (RIGHT); SALPINGO-OOPHORECTOMY:  - NEGATIVE FOR MALIGNANCY.  - OVARIAN CORTICAL INCLUSION CYSTS.  - ATROPHIC FALLOPIAN TUBE.   E. SENTINEL LYMPH NODE, RIGHT LOW PARA-AORTIC; EXCISION:  - NEGATIVE FOR MALIGNANCY, TWO LYMPH NODES (0/2).   F. SENTINEL LYMPH NODE, LEFT PRIMARY LOW PARA-AORTIC; EXCISION:  - NEGATIVE FOR MALIGNANCY, ONE LYMPH NODE (0/1).   DIAGNOSIS:  A.  PELVIC WASHINGS:  - NEGATIVE FOR MALIGNANCY.    Her case was discussed at [facility] and discussed that recurrence risk without adjuvant treatment is up to 50%.  So recommend adjuvant chemo and radiation.  She was seen by Dr. Baruch Gouty, for initial evaluation of stage IA uterine carcinosarcoma and plan is for brachytherapy after 6 cycles of carbo/taxol.    She initiated carbo-Taxol on 04/25/2018.  Completed 6 cycles on 08/08/2018.  She received vaginal brachytherapy with Dr. Baruch Gouty 10/19-11/09/2018.   She had thyroid ultrasound for incidental thyroid nodule seen on surveillance imaging for indeterminate pulmonary nodules on 5/20. Biopsy was benign.   07/11/18- CT Chest WO Contrast for follow-up on pulmonary nodules IMPRESSION: 1. Previously visualized pulmonary nodules are either stable or resolved. Several new subcentimeter bilateral pulmonary nodules. Pulmonary nodularity generally appears centrilobular in distribution and while considered indeterminate is more suggestive of infectious/inflammatory nodularity. Continued chest CT surveillance is recommended in 3-6 months. 2. No thoracic adenopathy.  12/10/2018- CT Chest wo contrast - New sub solid density measuring 12 mm with  associated 3 mm solid nodule is noted in left upper lobe. There also noted several other new nodules noted more posteriorly in the left upper lobe, with the largest measuring 3 mm. These may simply be inflammatory in etiology, but neoplasm can not be excluded. Initial follow-up by chest CT without contrast is recommended in 3 months to confirm persistence. - Stable sub solid density is noted in right upper lobe with grossly stable 4 mm solid nodule. Multiple other pulmonary nodules are noted in both lungs which are stable compared to prior exam - Small nonobstructive right renal calculus - Stable left thyroid nodule - Stable hepatic cyst  She stopped smoking 2/20, but occasionally using vaping products.    CT scan chest 5/20 IMPRESSION: 1. Stable scattered pulmonary nodules as detailed above including a 1.2 cm part solid nodule in the right upper lobe. The solid component remains stable at approximately 0.4 cm. Follow-up non-contrast CT recommended at 3-6 months to confirm persistence. If unchanged, and solid component remains <6 mm, annual CT is recommended until 5 years of stability has been established. If persistent these nodules should be considered highly suspicious if the solid component of the nodule is 6 mm or greater in size and enlarging. This recommendation follows the consensus statement: Guidelines for Management of Incidental Pulmonary Nodules Detected on CT Images: From the Fleischner Society 2017; Radiology 2017; 284:228-243. 2. Additional scattered pulmonary nodules as detailed above measuring up to approximately 8 mm. These are stable from prior study. Attention on yearly follow-up examinations is recommended. 3. Bilateral nonobstructing renal nephroliths are again noted.  4. Mild bilateral interlobular septal thickening consistent with mild volume overload.  CT scan 8/21 IMPRESSION: 1. No acute intrathoracic, abdominal, or pelvic  pathology. 2. No evidence of metastatic disease  in the chest, abdomen, or pelvis. 3. Bilateral pulmonary nodules similar to prior CT. 4. Small nonobstructing right renal interpolar calculus. 5. Colonic diverticulosis. No bowel obstruction. 6. Aortic Atherosclerosis (ICD10-I70.0).  Primary care Colonoscopy on 03/13/20 with benign polyps and she will repeat in 5 years. Mammogram 03/09/21 was negative.   Problem List: Patient Active Problem List   Diagnosis Date Noted   Neuropathy 05/16/2018   Fatigue 05/16/2018   Inflammatory heel pain, right 05/16/2018   Encounter for antineoplastic chemotherapy 05/16/2018   Drug-induced neutropenia ([place]) 05/09/2018   Goals of care, counseling/discussion 04/18/2018   Endometrial cancer ([place])    Seborrhea 05/13/2016   History of kidney stones 06/26/2015   OP (osteoporosis) 06/26/2015   Plantar fasciitis 06/26/2015   Current tobacco use 06/26/2015   Kidney stone 06/21/2013   Chronic airway obstruction ([place]) 10/08/2009   Psoriasis 09/30/2008   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   Menopausal symptom 09/21/2007   Tobacco use 09/21/2007   Awareness of heartbeats 09/21/2007    Past Medical History: Past Medical History:  Diagnosis Date   Arthritis    Cancer ([place]) 2019   endometrial   Endometrial mass    History of chicken pox    History of kidney stones    History of measles    History of mumps    Osteoporosis    Personal history of chemotherapy    Personal history of radiation therapy    PMB (postmenopausal bleeding)    Psoriasis     Past Surgical History: Past Surgical History:  Procedure Laterality Date   APPENDECTOMY  1960   BREAST EXCISIONAL BIOPSY Left 1980   neg   BREAST SURGERY Left    biopsy   COLONOSCOPY WITH PROPOFOL N/A 03/13/2020   Procedure: COLONOSCOPY WITH PROPOFOL;  Surgeon: Jonathon Bellows, MD;  Location: [facility] END[facility]COPY;  Service: Gastroenterology;  Laterality: N/A;   Injection varicose veins in legs Bilateral 2010   Dr. Hulda Humphrey   LITHOTRIPSY   1994, 2004   for renal stones   PORTA CATH INSERTION N/A 04/24/2018   Procedure: PORTA CATH INSERTION;  Surgeon: Katha Cabal, MD;  Location: [place] CV LAB;  Service: Cardiovascular;  Laterality: N/A;   PORTA CATH REMOVAL N/A 01/18/2021   Procedure: PORTA CATH REMOVAL;  Surgeon: Algernon Huxley, MD;  Location: [place] CV LAB;  Service: Cardiovascular;  Laterality: N/A;   ROBOTIC ASSISTED TOTAL HYSTERECTOMY Bilateral 04/04/2018   Procedure: ROBOTIC ASSISTED TOTAL HYSTERECTOMY,SENTINEL LYMPH NODE MAPPING AND BIOPSIES,AORTIC LYMPH NODE DISSECTION;  Surgeon: Gillis Ends, MD;  Location: [facility] ORS;  Service: Gynecology;  Laterality: Bilateral;   TUBAL LIGATION  1980    OB History:  OB History  Gravida Para Term Preterm AB Living  1 1 1     1   SAB IAB Ectopic Multiple Live Births          1    # Outcome Date GA Lbr Len/2nd Weight Sex Delivery Anes PTL Lv  1 Term 86    M Vag-Spont   LIV   [id number] LMP:   Family History: Family History  Problem Relation Age of Onset   Breast cancer Mother    Transient ischemic attack Mother    Arthritis Mother    Hypothyroidism Mother    Lung cancer Mother    Diabetes Father        type 2   Hypertension Father    Arthritis Father    Colon  cancer Sister    Prostate cancer Brother    Hyperlipidemia Son    Stomach cancer Maternal Grandmother    Stroke Maternal Grandfather     Social History: Social History   Socioeconomic History   Marital status: Divorced    Spouse name: single   Number of children: 1   Years of education: Not on file   Highest education level: 10th grade  Occupational History   Occupation: retired  Tobacco Use   Smoking status: Every Day    Packs/day: 0.25    Years: 30.00    Pack years: 7.50    Types: Cigarettes   Smokeless tobacco: Never  Vaping Use   Vaping Use: Some days  Substance and Sexual Activity   Alcohol use: No    Alcohol/week: 0.0 standard drinks   Drug use: No   Sexual  activity: Not Currently  Other Topics Concern   Not on file  Social History Narrative   Not on file   Social Determinants of Health   Financial Resource Strain: Low Risk    Difficulty of Paying Living Expenses: Not hard at all  Food Insecurity: No Food Insecurity   Worried About Charity fundraiser in the Last Year: Never true   Ran Out of Food in the Last Year: Never true  Transportation Needs: No Transportation Needs   Lack of Transportation (Medical): No   Lack of Transportation (Non-Medical): No  Physical Activity: Inactive   Days of Exercise per Week: 0 days   Minutes of Exercise per Session: 0 min  Stress: Stress Concern Present   Feeling of Stress : To some extent  Social Connections: Socially Isolated   Frequency of Communication with Friends and Family: Twice a week   Frequency of Social Gatherings with Friends and Family: Three times a week   Attends Religious Services: Never   Active Member of Clubs or Organizations: No   Attends Archivist Meetings: Never   Marital Status: Divorced  Human resources officer Violence: Not At Risk   Fear of Current or Ex-Partner: No   Emotionally Abused: No   Physically Abused: No   Sexually Abused: No    Allergies: No Known Allergies  Current Medications: Current Outpatient Medications  Medication Sig Dispense Refill   betamethasone dipropionate 0.05 % cream Apply 1 application topically 2 (two) times daily as needed (for psoriasis on body). 30 g 1   desonide (DESOWEN) 0.05 % cream Apply 1 application topically 2 (two) times daily as needed (for psoriasis on face). 30 g 1   DULoxetine (CYMBALTA) 30 MG capsule Take 1 capsule (30 mg total) by mouth daily. 90 capsule 1   Multiple Vitamins-Minerals (CENTRUM SILVER ADULT 50+ PO) Take 1 tablet by mouth daily.     naproxen sodium (ALEVE) 220 MG tablet Take 220 mg by mouth daily as needed.     No current facility-administered medications for this visit.   Review of  Systems General:  no complaints Skin: no complaints Eyes: no complaints HEENT: no complaints Breasts: no complaints Pulmonary: no complaints Cardiac: no complaints Gastrointestinal: no complaints Genitourinary/Sexual: no complaints Ob/Gyn: no complaints Musculoskeletal: no complaints Hematology: no complaints Neurologic/Psych: no complaints   Objective:  Physical Examination:  Vitals:   12/29/21 1045  BP: 123/67  Pulse: 66  Resp: 19  Temp: (!) 96.2 F (35.7 C)  SpO2: 97%    ECOG Performance Status: 0  GENERAL: Patient is a well appearing female in no acute distress HEENT:  Sclera clear. Anicteric  NODES:  Negative axillary, supraclavicular, inguinal lymph node survery LUNGS:  Clear to auscultation bilaterally.   HEART:  Regular rate and rhythm.  ABDOMEN:  Soft, nontender.  No hernias, incisions well healed. No masses or ascites EXTREMITIES:  No peripheral edema. Atraumatic. No cyanosis SKIN:  Clear with no obvious rashes or skin changes.  NEURO:  Nonfocal. Well oriented.  Appropriate affect.  Pelvic: Exam chaperoned by CMA EGBUS: no lesions, discharge or bleeding. Vagina: no lesions, discharge, or bleeding. Cervix: Uterus: surgically absent. BME: No palpable masses, parametria smooth. Rectovaginal deferred.   Radiology:   07/21/2021 CT CHEST, ABDOMEN, AND PELVIS WITH CONTRAST   TECHNIQUE: Multidetector CT imaging of the chest, abdomen and pelvis was performed following the standard protocol during bolus administration of intravenous contrast.   CONTRAST:  124mL OMNIPAQUE IOHEXOL 350 MG/ML SOLN   COMPARISON:  CT chest abdomen pelvis, 07/02/2020 and 03/30/2018.   FINDINGS: CT CHEST FINDINGS   Cardiovascular: No significant vascular findings. Normal heart size. No pericardial effusion. Interval removal of RIGHT chest port.   Mediastinum/Nodes: No enlarged mediastinal, hilar, or axillary lymph nodes. Asymmetric enlargement of the LEFT thyroid gland, without  discrete lesion. Trachea and esophagus demonstrate no significant findings.   Lungs/Pleura: No pleural effusion or pneumothorax. Central airways are patent. Minimal bibasilar atelectasis.   New and persistent bilateral pleural and pulmonary nodules, including;   *RIGHT upper lobe subsolid pulmonary nodule measuring 0.5 cm (# 3, 33), previously 3 mm *Middle lobe nodule measures 0.5 cm (# 3, 77), previously 0.3 cm.   *RIGHT lower lobe perifissural nodule measuring 0.6 cm (# 3, 82), unchanged *New posterior LEFT upper lobe subpleural nodules measuring 1.0 cm and 0.5 cm (# 3, 26 and 30) *LEFT lower lobe subpleural nodule measuring 0.5 cm, unchanged *LEFT basilar nodule with adjacent atelectasis, persistent since comparison measures 1.0 cm   Musculoskeletal: No chest wall mass or suspicious bone lesions identified.   CT ABDOMEN PELVIS FINDINGS   Hepatobiliary: Similar appearance of multifocal hypodense hepatic lesions, consistent with cysts. Normal-sized liver and contour. Phyrigian cap. No gallstones, gallbladder wall thickening, or biliary dilatation.   Pancreas: Unremarkable. No pancreatic ductal dilatation or surrounding inflammatory changes.   Spleen: Normal in size without focal abnormality. Small perihilar accessory spleen.   Adrenals/Urinary Tract: Adrenal glands are unremarkable. Kidneys are normal, without renal calculi, focal lesion, or hydronephrosis. Bladder is unremarkable.   Stomach/Bowel: Enteral contrast opacification of stomach, small bowel and cecum. No extraluminal contrast extravasation. Stomach is within normal limits. Appendix is not definitely visualized. Nondistended small bowel. Nondilated colon. Mild descending colonic diverticulosis. No evidence of bowel wall thickening, distention, or inflammatory changes.   Vascular/Lymphatic: Mild burden of aortic atherosclerosis. No enlarged abdominal or pelvic lymph nodes.   Reproductive: Status post hysterectomy. No adnexal  masses.   Other: No abdominal wall hernia or abnormality. No abdominopelvic ascites.   Musculoskeletal: No aggressive osseous lesion. Moderate degenerative changes of the spine, greatest within the lumbar spine at L4-5 and L5-S1, including disc change, endplate sclerosis and osteophytosis.   IMPRESSION: Since CT CAP dated 07/02/2020;   1. New posterior LEFT upper lobe subpleural nodules, largest measuring up to 1.0 cm (see key image). Attention on follow-up. Additional pulmonary nodules are similar to comparison. 2. No evidence to suggest metastatic disease within the chest, abdomen or pelvis. 3. Interval port removal. Additional chronic and senescent changes, as above.     Assessment:  Katherine Snyder is a 71 y.o. female diagnosed with stage IA uterine carcinosarcoma with RA  TLH/BSO, negative staging 5/19 followed by vaginal brachytherapy and 6 cycles of carbo/taxol chemotherapy completed 08/08/2018.  NED on exam today.   Chest CTs have  shown indeterminate lung nodules, some disapearing over time and some new thought to be benign.  She is a current smoker.   Hepatic cyst, stable on CT.   H/o nonobstructing kidney stones on CT   Comorbidities complicating care: smoker, plantar fasciitis  Plan:   Problem List Items Addressed This Visit   None  Continue to follow up with Dr. Tasia Catchings and chest CT results.   She is currently 3.5 years from completing treatment. She will see Dr. Baruch Gouty in May as scheduled and we will see her 4 months after that time. She will continue follow up with Dr. Tasia Catchings.   CT Chest as scheduled in May 2023 to follow up on lung nodules. If stable could also consider lung cancer screening exams in the future. Dr. Amalia Hailey and see if he would like to be involved in her surveillance visits.   She agrees with this plan.   A total of 25 minutes were spent with the patient/family today; >50% was spent in education, counseling and coordination of care for h/o endometrial cancer,  pulmonary nodules, smoking cessation, hepatic cysts, and kidney stones.   Verlon Au, NP

## 2022-01-17 ENCOUNTER — Ambulatory Visit
Admission: RE | Admit: 2022-01-17 | Discharge: 2022-01-17 | Disposition: A | Discharge status: Home / Self Care | Payer: Medicare Other | Source: Ambulatory Visit | Attending: Oncology | Admitting: Oncology

## 2022-01-17 ENCOUNTER — Other Ambulatory Visit: Payer: Self-pay

## 2022-01-17 DIAGNOSIS — I700 Atherosclerosis of aorta: Secondary | ICD-10-CM | POA: Diagnosis not present

## 2022-01-17 DIAGNOSIS — R911 Solitary pulmonary nodule: Secondary | ICD-10-CM | POA: Diagnosis not present

## 2022-02-01 ENCOUNTER — Other Ambulatory Visit: Payer: Self-pay

## 2022-02-01 ENCOUNTER — Ambulatory Visit
Admission: RE | Admit: 2022-02-01 | Discharge: 2022-02-01 | Disposition: A | Discharge status: Home / Self Care | Payer: Medicare Other | Source: Ambulatory Visit | Attending: Family Medicine | Admitting: Family Medicine

## 2022-02-01 DIAGNOSIS — Z780 Asymptomatic menopausal state: Secondary | ICD-10-CM | POA: Diagnosis not present

## 2022-02-01 DIAGNOSIS — M8589 Other specified disorders of bone density and structure, multiple sites: Secondary | ICD-10-CM | POA: Diagnosis not present

## 2022-02-01 DIAGNOSIS — M810 Age-related osteoporosis without current pathological fracture: Secondary | ICD-10-CM | POA: Diagnosis not present

## 2022-03-10 ENCOUNTER — Other Ambulatory Visit: Payer: Self-pay | Admitting: Oncology

## 2022-03-10 ENCOUNTER — Ambulatory Visit (INDEPENDENT_AMBULATORY_CARE_PROVIDER_SITE_OTHER): Payer: Medicare Other | Admitting: Physician Assistant

## 2022-03-10 ENCOUNTER — Encounter: Payer: Self-pay | Admitting: Physician Assistant

## 2022-03-10 VITALS — BP 134/71 | HR 74 | Temp 97.9°F | Ht 67.0 in | Wt 171.7 lb

## 2022-03-10 DIAGNOSIS — J302 Other seasonal allergic rhinitis: Secondary | ICD-10-CM | POA: Diagnosis not present

## 2022-03-10 DIAGNOSIS — N3000 Acute cystitis without hematuria: Secondary | ICD-10-CM | POA: Diagnosis not present

## 2022-03-10 DIAGNOSIS — R350 Frequency of micturition: Secondary | ICD-10-CM | POA: Diagnosis not present

## 2022-03-10 LAB — POCT URINALYSIS DIPSTICK
Bilirubin, UA: NEGATIVE
Blood, UA: NEGATIVE
Glucose, UA: NEGATIVE
Ketones, UA: NEGATIVE
Nitrite, UA: POSITIVE
Protein, UA: NEGATIVE
Spec Grav, UA: 1.01 (ref 1.010–1.025)
Urobilinogen, UA: 0.2 E.U./dL
pH, UA: 6 (ref 5.0–8.0)

## 2022-03-10 MED ORDER — AZELASTINE HCL 0.1 % NA SOLN: 1.0000 | Freq: Two times a day (BID) | NASAL | 3 refills | Status: DC

## 2022-03-10 MED ORDER — LORATADINE 10 MG PO TABS: 10.0000 mg | ORAL_TABLET | Freq: Every day | ORAL | 1 refills | Status: DC

## 2022-03-10 MED ORDER — SULFAMETHOXAZOLE-TRIMETHOPRIM 800-160 MG PO TABS: 1.0000 | ORAL_TABLET | Freq: Two times a day (BID) | ORAL | 0 refills | Status: AC

## 2022-03-10 NOTE — Progress Notes (Signed)
?  ? ? ?  I,Sha'taria Tyson,acting as a Education administrator for [employer], PA-C.,have documented all relevant documentation on the behalf of Mikey Kirschner, PA-C,as directed by  Mikey Kirschner, PA-C while in the presence of Mikey Kirschner, PA-C. ? ?Established patient visit ? ? ?Patient: Katherine Snyder   DOB: 1951-07-12   71 y.o. Female  MRN: [id number] ?Visit Date: 03/10/2022 ? ?Today's healthcare provider: Mikey Kirschner, PA-C  ? ?Cc. Dysuria x 5 days ? ?Subjective  ?  ? ?Pt reports lower abdominal pain, dysuria, urinary frequency, urgency x 5 days. Denies fevers, chills, body aches, low back pain. Azo otc helped with pain but her symptoms came back when she stopped taking it. ? ?Medications: ?Outpatient Medications Prior to Visit  ?Medication Sig  ? betamethasone dipropionate 0.05 % cream Apply 1 application topically 2 (two) times daily as needed (for psoriasis on body).  ? desonide (DESOWEN) 0.05 % cream Apply 1 application topically 2 (two) times daily as needed (for psoriasis on face).  ? DULoxetine (CYMBALTA) 30 MG capsule Take 1 capsule (30 mg total) by mouth daily.  ? Multiple Vitamins-Minerals (CENTRUM SILVER ADULT 50+ PO) Take 1 tablet by mouth daily.  ? naproxen sodium (ALEVE) 220 MG tablet Take 220 mg by mouth daily as needed.  ? ?No facility-administered medications prior to visit.  ? ? ?Review of Systems  ?Genitourinary:  Positive for frequency and urgency.  ? ? ?  Objective  ?  ?Blood pressure 134/71, pulse 74, temperature 97.9 ?F (36.6 ?C), temperature source Oral, height '5\' 7"'$  (1.702 m), weight 171 lb 11.2 oz (77.9 kg), SpO2 97 %.  ? ?Physical Exam ?Constitutional:   ?   General: She is awake.  ?   Appearance: She is well-developed.  ?HENT:  ?   Head: Normocephalic.  ?Eyes:  ?   Conjunctiva/sclera: Conjunctivae normal.  ?Cardiovascular:  ?   Rate and Rhythm: Normal rate and regular rhythm.  ?   Heart sounds: Normal heart sounds.  ?Pulmonary:  ?   Effort: Pulmonary effort is normal.  ?   Breath sounds: Normal  breath sounds.  ?Abdominal:  ?   General: Abdomen is flat. There is no distension.  ?   Palpations: Abdomen is soft.  ?   Tenderness: There is no abdominal tenderness. There is no right CVA tenderness, left CVA tenderness or guarding.  ?Skin: ?   General: Skin is warm.  ?Neurological:  ?   Mental Status: She is alert and oriented to person, place, and time.  ?Psychiatric:     ?   Attention and Perception: Attention normal.     ?   Mood and Affect: Mood normal.     ?   Speech: Speech normal.     ?   Behavior: Behavior is cooperative.  ?  ? ?No results found for any visits on 03/10/22. ? Assessment & Plan  ?  ? ?Acute cystitis ?UA + leuks, nitrites ?Culture sent ?Rx bactrim pobid x 5 days ?Advised azo prn, fluids ? ?2. Allergic rhinitis  ?Rx claritin, take daily in AM ?Rx azelastine, use prn nighttime ? ?I, Mikey Kirschner, PA-C have reviewed all documentation for this visit. The documentation on  03/10/2022 for the exam, diagnosis, procedures, and orders are all accurate and complete. ? ?Mikey Kirschner, PA-C ?[facility] ?[id number] ?[place], [place], [address] ?Office: [phone] ?Fax: [phone]  ? ?[facility] ? ?

## 2022-03-16 LAB — SPECIMEN STATUS REPORT

## 2022-03-21 ENCOUNTER — Inpatient Hospital Stay (HOSPITAL_BASED_OUTPATIENT_CLINIC_OR_DEPARTMENT_OTHER): Payer: Medicare Other | Admitting: Oncology

## 2022-03-21 ENCOUNTER — Encounter: Payer: Self-pay | Admitting: Oncology

## 2022-03-21 ENCOUNTER — Inpatient Hospital Stay: Payer: Medicare Other | Attending: Obstetrics and Gynecology

## 2022-03-21 VITALS — BP 129/67 | HR 57 | Temp 98.7°F | Resp 20 | Wt 172.7 lb

## 2022-03-21 DIAGNOSIS — T451X5A Adverse effect of antineoplastic and immunosuppressive drugs, initial encounter: Secondary | ICD-10-CM | POA: Diagnosis not present

## 2022-03-21 DIAGNOSIS — Z79899 Other long term (current) drug therapy: Secondary | ICD-10-CM | POA: Insufficient documentation

## 2022-03-21 DIAGNOSIS — G629 Polyneuropathy, unspecified: Secondary | ICD-10-CM

## 2022-03-21 DIAGNOSIS — R918 Other nonspecific abnormal finding of lung field: Secondary | ICD-10-CM | POA: Diagnosis not present

## 2022-03-21 DIAGNOSIS — F17210 Nicotine dependence, cigarettes, uncomplicated: Secondary | ICD-10-CM | POA: Diagnosis not present

## 2022-03-21 DIAGNOSIS — R911 Solitary pulmonary nodule: Secondary | ICD-10-CM

## 2022-03-21 DIAGNOSIS — C55 Malignant neoplasm of uterus, part unspecified: Secondary | ICD-10-CM | POA: Insufficient documentation

## 2022-03-21 DIAGNOSIS — G620 Drug-induced polyneuropathy: Secondary | ICD-10-CM | POA: Diagnosis not present

## 2022-03-21 DIAGNOSIS — Z8542 Personal history of malignant neoplasm of other parts of uterus: Secondary | ICD-10-CM

## 2022-03-21 LAB — CBC WITH DIFFERENTIAL/PLATELET
Abs Immature Granulocytes: 0.01 10*3/uL (ref 0.00–0.07)
Basophils Absolute: 0.1 10*3/uL (ref 0.0–0.1)
Basophils Relative: 1 %
Eosinophils Absolute: 0.3 10*3/uL (ref 0.0–0.5)
Eosinophils Relative: 4 %
HCT: 38.9 % (ref 36.0–46.0)
Hemoglobin: 12.7 g/dL (ref 12.0–15.0)
Immature Granulocytes: 0 %
Lymphocytes Relative: 35 %
Lymphs Abs: 2.2 10*3/uL (ref 0.7–4.0)
MCH: 29.7 pg (ref 26.0–34.0)
MCHC: 32.6 g/dL (ref 30.0–36.0)
MCV: 91.1 fL (ref 80.0–100.0)
Monocytes Absolute: 0.6 10*3/uL (ref 0.1–1.0)
Monocytes Relative: 9 %
Neutro Abs: 3.1 10*3/uL (ref 1.7–7.7)
Neutrophils Relative %: 51 %
Platelets: 235 10*3/uL (ref 150–400)
RBC: 4.27 MIL/uL (ref 3.87–5.11)
RDW: 13.2 % (ref 11.5–15.5)
WBC: 6.1 10*3/uL (ref 4.0–10.5)
nRBC: 0 % (ref 0.0–0.2)

## 2022-03-21 LAB — COMPREHENSIVE METABOLIC PANEL
ALT: 13 U/L (ref 0–44)
AST: 19 U/L (ref 15–41)
Albumin: 3.6 g/dL (ref 3.5–5.0)
Alkaline Phosphatase: 60 U/L (ref 38–126)
Anion gap: 5 (ref 5–15)
BUN: 18 mg/dL (ref 8–23)
CO2: 27 mmol/L (ref 22–32)
Calcium: 8.8 mg/dL — ABNORMAL LOW (ref 8.9–10.3)
Chloride: 106 mmol/L (ref 98–111)
Creatinine, Ser: 0.82 mg/dL (ref 0.44–1.00)
GFR, Estimated: >60 mL/min (ref >60)
Glucose, Bld: 97 mg/dL (ref 70–99)
Potassium: 4.5 mmol/L (ref 3.5–5.1)
Sodium: 138 mmol/L (ref 135–145)
Total Bilirubin: 0.6 mg/dL (ref 0.3–1.2)
Total Protein: 6.8 g/dL (ref 6.5–8.1)

## 2022-03-21 NOTE — Progress Notes (Signed)
?Hematology/Oncology Progress note ?[phone] [phone] ?  ? ? ? ?Patient Care Team: ?Emelia Loron as PCP - General (Physician Assistant) ?Mellody Drown, MD as Referring Physician (Obstetrics and Gynecology) ?Clent Jacks, RN as Oncology Nurse Navigator ?Noreene Filbert, MD as Referring Physician (Radiation Oncology) ?Earlie Server, MD as Consulting Physician (Oncology) ?Pa, [facility]) ?REASON FOR VISIT ?Follow up for treatment of uterus carcinosarcoma ? ?HISTORY OF PRESENTING ILLNESS:  ?Katherine Snyder is a  71 y.o.  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 ?Pathology showed  ?A uterus with cervix hysterectomy ?-Carcinosarcoma ?B sentinel lymph node, right primary obturator negative ?C lymph node right external iliac lymph negative ?D fallopian tube and ovary, unilateral right negative ?E sentinel lymph node right low periaortic: Negative ?F sentinel lymph node left low periaortic negative ? ?CANCER CASE SUMMARY: ENDOMETRIUM  ?Procedure: Total hysterectomy and bilateral salpingo-oophorectomy  ?Histologic Type: Carcinosarcoma  ?Histologic Grade: Not applicable  ?Myometrial Invasion: Present  ?     Depth of myometrial invasion cannot be determined due to exophytic  ?tumor growth and effacement of endometrial/myometrial junction  ?     Myometrial thickness: At least 8 mm  ?     Percentage depth of myometrial invasion: Estimated less than 50%  ?Uterine Serosa Involvement: Not identified  ?Cervical Stromal Involvement: Not identified  ?Other Tissue/Organ Involvement: Not identified  ?Peritoneal/ Ascitic Fluid: Negative for malignancy  ?Lymphovascular Invasion: Present  ?Regional Lymph Nodes: All lymph nodes negative for tumor cells  ?Number of Lymph Nodes Examined: 5  ?      Total number of pelvic nodes examined: 2  ?     Number of pelvic sentinel nodes examined: 1  ?     Total number of para-aortic nodes examined: 3  ?     Number of para-aortic sentinel nodes examined: 3  ? ?03/01/2018 Pathologic Stage Classification (pTNM, AJCC 8th Edition) (Note J): pT1a  pN0/FIGO IA  ? 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 ? ? ?Pertinent oncology history ?Katherine Snyder is a 71 y.o. female who has above history reviewed by me today presents for assessment prior to Cycle 6 adjuvant chemotherapy management for stage Ia endometrial carcinosarcoma. ?During cycle 1 treatment, reported feeling burning down and having really bad heartburn.  Taxol was temporarily stopped.  The patient was given Benadryl, Solu-Medrol, Pepcid, symptoms improved and resolved.  Taxol was restarted and patient is able to finish her treatment. ?Patient reports feeling extremely tired for 2 days after chemotherapy.  She was seen by nurse practitioner Sonia Baller during interval.   ?She complained burning sensation with urination and had UA and urine culture obtained.  UA was not convincing for UTI urine culture showed less than 10,000 colonies of insignificant growth.  Patient remains afebrile. ?Patient also reports her chronic fasciitis of foot has got a lot worse since the start of chemotherapy.  Possible neuropathy was discussed and the patient is reluctant to start gabapentin or Cymbalta.  She was given tramadol 100 mg twice daily.  Patient reports symptoms gradually improved.   ? Patient was found to have neutropenia so she received Neupogen x  2. She reports bone pain started after Neupogen shots.  ? ?.#Lung nodules ?# 12/10/2018- CT Chest wo contrast ?- New sub solid density measuring 12 mm with associated 3 mm  solid nodule is noted in left upper lobe.Stable sub solid density is noted in right upper lobe with grossly stable 4 mm solid nodule. Multiple other pulmonary nodules are noted in both lungs which are stable compared to prior exam ?Case was discussed on tumor board and recommend surveillance CT in 3 months.   ?new lung nodules have resolved on 10/14/2019  ?All other nodules are stable.  ? ?# #Left thyroid nodule,    ?S/p thyroid nodule FNA.- pathology is benign.  ? ?INTERVAL HISTORY ?Katherine Snyder is a 71 y.o. female who has above history reviewed by me presents for assessment prior to Cycle 6 adjuvant chemotherapy for stage Ia endometrial carcinosarcoma.  ?Problems and complaints are listed below: ?Patient reports feeling well ?She has no new complaints. ?Chronic neuropathy and since, she is on Cymbalta 30 mg daily and request a refill.  No new complaints. ? ? ? ? ?Review of Systems  ?Constitutional:  Negative for chills, fever, malaise/fatigue and weight loss.  ?HENT:  Negative for sore throat.   ?Eyes:  Negative for redness.  ?Respiratory:  Negative for cough, shortness of breath and wheezing.   ?Cardiovascular:  Negative for chest pain, palpitations and leg swelling.  ?Gastrointestinal:  Negative for abdominal pain, blood in stool, nausea and vomiting.  ?Genitourinary:  Negative for dysuria.  ?Musculoskeletal:  Negative for myalgias.  ?Skin:  Negative for rash.  ?Neurological:  Negative for dizziness, tingling and tremors.  ?Endo/Heme/Allergies:  Does not bruise/bleed easily.  ?Psychiatric/Behavioral:  Negative for hallucinations.   ? ?MEDICAL HISTORY:  ?Past Medical History:  ?Diagnosis Date  ? Arthritis   ? Cancer [facility]) 2019  ? endometrial  ? Endometrial mass   ? History of chicken pox   ? History of kidney stones   ? History of measles   ? History of mumps   ? Osteoporosis   ? Personal history of chemotherapy   ? Personal history of radiation therapy   ? PMB (postmenopausal bleeding)   ? Psoriasis    ? ? ?SURGICAL HISTORY: ?Past Surgical History:  ?Procedure Laterality Date  ? APPENDECTOMY  1960  ? BREAST EXCISIONAL BIOPSY Left 1980  ? neg  ? BREAST SURGERY Left   ? biopsy  ? COLONOSCOPY WITH PROPOFOL N/A 03/13/2020  ? Procedure: COLONOSCOPY WITH PROPOFOL;  Surgeon: Jonathon Bellows, MD;  Location: [facility] ENDOSCOPY;  Service: Gastroenterology;  Laterality: N/A;  ? Injection varicose veins in legs Bilateral 2010  ? Dr. Hulda Humphrey  ? LITHOTRIPSY  1994, 2004  ? for renal stones  ? PORTA CATH INSERTION N/A 04/24/2018  ? Procedure: PORTA CATH INSERTION;  Surgeon: Katha Cabal, MD;  Location: [place] CV LAB;  Service: Cardiovascular;  Laterality: N/A;  ? PORTA CATH REMOVAL N/A 01/18/2021  ? Procedure: PORTA CATH REMOVAL;  Surgeon: Algernon Huxley, MD;  Location: [place] CV LAB;  Service: Cardiovascular;  Laterality: N/A;  ? ROBOTIC ASSISTED TOTAL HYSTERECTOMY Bilateral 04/04/2018  ? Procedure: ROBOTIC ASSISTED TOTAL HYSTERECTOMY,SENTINEL LYMPH NODE MAPPING AND BIOPSIES,AORTIC LYMPH NODE DISSECTION;  Surgeon: Gillis Ends, MD;  Location: [facility] ORS;  Service: Gynecology;  Laterality: Bilateral;  ? TUBAL LIGATION  1980  ? ? ?SOCIAL HISTORY: ?Social History  ? ?Socioeconomic History  ? Marital status: Divorced  ?  Spouse name: single  ? Number of children: 1  ?  Years of education: Not on file  ? Highest education level: 10th grade  ?Occupational History  ? Occupation: retired  ?Tobacco Use  ? Smoking status: Every Day  ?  Packs/day: 0.25  ?  Years: 30.00  ?  Pack years: 7.50  ?  Types: Cigarettes  ? Smokeless tobacco: Never  ?Vaping Use  ? Vaping Use: Some days  ?Substance and Sexual Activity  ? Alcohol use: No  ?  Alcohol/week: 0.0 standard drinks  ? Drug use: No  ? Sexual activity: Not Currently  ?Other Topics Concern  ? Not on file  ?Social History Narrative  ? Not on file  ? ?Social Determinants of Health  ? ?Financial Resource Strain: Low Risk   ? Difficulty of Paying Living Expenses: Not hard at all  ?Food  Insecurity: No Food Insecurity  ? Worried About Charity fundraiser in the Last Year: Never true  ? Ran Out of Food in the Last Year: Never true  ?Transportation Needs: No Transportation Needs  ? Lack of Transportati

## 2022-03-22 LAB — CA 125: Cancer Antigen (CA) 125: 12.9 U/mL (ref 0.0–38.1)

## 2022-03-24 ENCOUNTER — Ambulatory Visit (INDEPENDENT_AMBULATORY_CARE_PROVIDER_SITE_OTHER): Payer: Medicare Other | Admitting: Physician Assistant

## 2022-03-24 ENCOUNTER — Encounter: Payer: Self-pay | Admitting: Physician Assistant

## 2022-03-24 VITALS — BP 123/71 | HR 73 | Ht 67.0 in | Wt 172.6 lb

## 2022-03-24 DIAGNOSIS — L409 Psoriasis, unspecified: Secondary | ICD-10-CM

## 2022-03-24 DIAGNOSIS — Z0000 Encounter for general adult medical examination without abnormal findings: Secondary | ICD-10-CM

## 2022-03-24 DIAGNOSIS — Z1231 Encounter for screening mammogram for malignant neoplasm of breast: Secondary | ICD-10-CM

## 2022-03-24 MED ORDER — TRIAMCINOLONE ACETONIDE 0.025 % EX CREA: 1.0000 "application " | TOPICAL_CREAM | Freq: Every day | CUTANEOUS | 1 refills | Status: DC | PRN

## 2022-03-24 NOTE — Assessment & Plan Note (Signed)
Per pt insurance would not cover desonide for face-- tx kenalog 0.025% cream use sparingly on nasolabial folds and forehead, avoid eyes

## 2022-03-24 NOTE — Progress Notes (Signed)
I,Sha'taria Tyson,acting as a Education administrator for [employer], PA-C.,have documented all relevant documentation on the behalf of Mikey Kirschner, PA-C,as directed by  Mikey Kirschner, PA-C while in the presence of Mikey Kirschner, PA-C.   Complete physical exam   Patient: Katherine Snyder   DOB: 02-26-1951   71 y.o. Female  MRN: [id number] Visit Date: 03/24/2022  Today's healthcare provider: Mikey Kirschner, PA-C   Cc. cpe  Subjective    Katherine Snyder is a 71 y.o. female who presents today for a complete physical exam.  She reports consuming a general diet. The patient does not participate in regular exercise at present. She generally feels well. She reports sleeping well. She does not have additional problems to discuss today.    Past Medical History:  Diagnosis Date   Arthritis    Cancer ([place]) 2019   endometrial   Endometrial mass    History of chicken pox    History of kidney stones    History of measles    History of mumps    Osteoporosis    Personal history of chemotherapy    Personal history of radiation therapy    PMB (postmenopausal bleeding)    Psoriasis    Past Surgical History:  Procedure Laterality Date   APPENDECTOMY  1960   BREAST EXCISIONAL BIOPSY Left 1980   neg   BREAST SURGERY Left    biopsy   COLONOSCOPY WITH PROPOFOL N/A 03/13/2020   Procedure: COLONOSCOPY WITH PROPOFOL;  Surgeon: Jonathon Bellows, MD;  Location: [facility] ENDOSCOPY;  Service: Gastroenterology;  Laterality: N/A;   Injection varicose veins in legs Bilateral 2010   Dr. Hulda Humphrey   LITHOTRIPSY  1994, 2004   for renal stones   PORTA CATH INSERTION N/A 04/24/2018   Procedure: PORTA CATH INSERTION;  Surgeon: Katha Cabal, MD;  Location: [place] CV LAB;  Service: Cardiovascular;  Laterality: N/A;   PORTA CATH REMOVAL N/A 01/18/2021   Procedure: PORTA CATH REMOVAL;  Surgeon: Algernon Huxley, MD;  Location: [place] CV LAB;  Service: Cardiovascular;  Laterality: N/A;   ROBOTIC ASSISTED TOTAL  HYSTERECTOMY Bilateral 04/04/2018   Procedure: ROBOTIC ASSISTED TOTAL HYSTERECTOMY,SENTINEL LYMPH NODE MAPPING AND BIOPSIES,AORTIC LYMPH NODE DISSECTION;  Surgeon: Gillis Ends, MD;  Location: [facility] ORS;  Service: Gynecology;  Laterality: Bilateral;   TUBAL LIGATION  1980   Social History   Socioeconomic History   Marital status: Divorced    Spouse name: single   Number of children: 1   Years of education: Not on file   Highest education level: 10th grade  Occupational History   Occupation: retired  Tobacco Use   Smoking status: Every Day    Packs/day: 0.25    Years: 30.00    Pack years: 7.50    Types: Cigarettes   Smokeless tobacco: Never  Vaping Use   Vaping Use: Some days  Substance and Sexual Activity   Alcohol use: No    Alcohol/week: 0.0 standard drinks   Drug use: No   Sexual activity: Not Currently  Other Topics Concern   Not on file  Social History Narrative   Not on file   Social Determinants of Health   Financial Resource Strain: Low Risk    Difficulty of Paying Living Expenses: Not hard at all  Food Insecurity: No Food Insecurity   Worried About Charity fundraiser in the Last Year: Never true   Ran Out of Food in the Last Year: Never true  Transportation Needs: No  Transportation Needs   Lack of Transportation (Medical): No   Lack of Transportation (Non-Medical): No  Physical Activity: Inactive   Days of Exercise per Week: 0 days   Minutes of Exercise per Session: 0 min  Stress: Stress Concern Present   Feeling of Stress : To some extent  Social Connections: Socially Isolated   Frequency of Communication with Friends and Family: Twice a week   Frequency of Social Gatherings with Friends and Family: Three times a week   Attends Religious Services: Never   Active Member of Clubs or Organizations: No   Attends Archivist Meetings: Never   Marital Status: Divorced  Human resources officer Violence: Not At Risk   Fear of Current or  Ex-Partner: No   Emotionally Abused: No   Physically Abused: No   Sexually Abused: No   Family Status  Relation Name Status   Mother  Deceased at age 56       died from Metastatic cancer. Gallblader cancer   Father  Alive   Sister  Alive   Brother  Alive   Son  Alive   MGM  Deceased   MGF  Deceased   Family History  Problem Relation Age of Onset   Breast cancer Mother    Transient ischemic attack Mother    Arthritis Mother    Hypothyroidism Mother    Lung cancer Mother    Diabetes Father        type 2   Hypertension Father    Arthritis Father    Colon cancer Sister    Prostate cancer Brother    Hyperlipidemia Son    Stomach cancer Maternal Grandmother    Stroke Maternal Grandfather    No Known Allergies  Patient Care Team: Mikey Kirschner, PA-C as PCP - General (Physician Assistant) Mellody Drown, MD as Referring Physician (Obstetrics and Gynecology) Clent Jacks, RN as Oncology Nurse Navigator Noreene Filbert, MD as Referring Physician (Radiation Oncology) Earlie Server, MD as Consulting Physician (Oncology) Pa, [place] [facility])   Medications: Outpatient Medications Prior to Visit  Medication Sig   azelastine (ASTELIN) 0.1 % nasal spray Place 1 spray into both nostrils 2 (two) times daily. Use in each nostril as directed   betamethasone dipropionate 0.05 % cream Apply 1 application topically 2 (two) times daily as needed (for psoriasis on body).   DULoxetine (CYMBALTA) 30 MG capsule TAKE 1 CAPSULE BY MOUTH EVERY DAY   loratadine (CLARITIN) 10 MG tablet Take 1 tablet (10 mg total) by mouth daily.   Multiple Vitamins-Minerals (CENTRUM SILVER ADULT 50+ PO) Take 1 tablet by mouth daily.   naproxen sodium (ALEVE) 220 MG tablet Take 220 mg by mouth daily as needed.   [DISCONTINUED] desonide (DESOWEN) 0.05 % cream Apply 1 application topically 2 (two) times daily as needed (for psoriasis on face).   FLUZONE HIGH-DOSE QUADRIVALENT 0.7 ML SUSY    MODERNA  COVID-19 BIVAL BOOSTER 50 MCG/0.5ML injection    No facility-administered medications prior to visit.    Review of Systems  All other systems reviewed and are negative.    Objective     Blood pressure 123/71, pulse 73, height '5\' 7"'$  (1.702 m), weight 172 lb 9.6 oz (78.3 kg), SpO2 98 %.    Physical Exam Constitutional:      General: She is awake.     Appearance: She is well-developed. She is not ill-appearing.  HENT:     Head: Normocephalic.     Right Ear: Tympanic membrane normal.  There is impacted cerumen.     Left Ear: Tympanic membrane normal. There is impacted cerumen.     Nose: Nose normal. No congestion or rhinorrhea.     Mouth/Throat:     Pharynx: No oropharyngeal exudate or posterior oropharyngeal erythema.  Eyes:     Conjunctiva/sclera: Conjunctivae normal.     Pupils: Pupils are equal, round, and reactive to light.  Neck:     Thyroid: No thyroid mass or thyromegaly.  Cardiovascular:     Rate and Rhythm: Normal rate and regular rhythm.     Heart sounds: Normal heart sounds.  Pulmonary:     Effort: Pulmonary effort is normal.     Breath sounds: Normal breath sounds.  Abdominal:     Palpations: Abdomen is soft.     Tenderness: There is no abdominal tenderness.  Musculoskeletal:     Right lower leg: No swelling. No edema.     Left lower leg: No swelling. No edema.  Lymphadenopathy:     Cervical: No cervical adenopathy.  Skin:    General: Skin is warm.  Neurological:     Mental Status: She is alert and oriented to person, place, and time.  Psychiatric:        Attention and Perception: Attention normal.        Mood and Affect: Mood normal.        Speech: Speech normal.        Behavior: Behavior normal. Behavior is cooperative.    Last depression screening scores    03/24/2022   11:12 AM 12/15/2021   11:14 AM 03/02/2021   10:42 AM  PHQ 2/9 Scores  PHQ - 2 Score 0 0 0  PHQ- 9 Score 0  0   Last fall risk screening    03/24/2022   11:12 AM  [place] in the past year? 0  Number falls in past yr: 0  Injury with Fall? 0  Risk for fall due to : No Fall Risks   Last Audit-C alcohol use screening    03/24/2022   11:12 AM  Alcohol Use Disorder Test (AUDIT)  1. How often do you have a drink containing alcohol? 0  2. How many drinks containing alcohol do you have on a typical day when you are drinking? 0  3. How often do you have six or more drinks on one occasion? 0  AUDIT-C Score 0   A score of 3 or more in women, and 4 or more in men indicates increased risk for alcohol abuse, EXCEPT if all of the points are from question 1   No results found for any visits on 03/24/22.  Assessment & Plan    Routine Health Maintenance and Physical Exam  Exercise Activities and Dietary recommendations  Goals      DIET - EAT MORE FRUITS AND VEGETABLES     DIET - INCREASE WATER INTAKE     Recommend to drink at least 6-8 8oz glasses of water per day.        Immunization History  Administered Date(s) Administered   Influenza, High Dose Seasonal PF 08/03/2019, 08/07/2020   Influenza-Unspecified 08/16/2016   Moderna Sars-Covid-2 Vaccination 07/20/2020, 08/17/2020, 02/10/2021   Pneumococcal Conjugate-13 01/28/2019   Pneumococcal Polysaccharide-23 10/08/2009, 12/09/2020   Td 01/28/2019   Tdap 10/08/2009    Health Maintenance  Topic Date Due   MAMMOGRAM  03/09/2022   COVID-19 Vaccine (4 - Booster for Moderna series) 04/09/2022 (Originally 04/07/2021)   Zoster Vaccines- Shingrix (  1 of 2) 06/24/2022 (Originally 09/14/1972)   INFLUENZA VACCINE  06/07/2022   DEXA SCAN  02/02/2024   COLONOSCOPY (Pts 45-40yr Insurance coverage will need to be confirmed)  03/13/2025   TETANUS/TDAP  01/27/2029   Pneumonia Vaccine 71 Years old  Completed   Hepatitis C Screening  Completed   HPV VACCINES  Aged Out    Discussed health benefits of physical activity, and encouraged her to engage in regular exercise appropriate for her age and  condition.  Problem List Items Addressed This Visit       Musculoskeletal and Integument   Psoriasis    Per pt insurance would not cover desonide for face-- tx kenalog 0.025% cream use sparingly on nasolabial folds and forehead, avoid eyes       Relevant Medications   triamcinolone (KENALOG) 0.025 % cream     Other   Encounter for annual physical exam - Primary    Reviewed recent labs from oncology. Will get fasting lipids to screen for hyperlipidemia.         Relevant Orders   Lipid Profile   Other Visit Diagnoses     Encounter for screening mammogram for breast cancer       Relevant Orders   MM 3D SCREEN BREAST BILATERAL        Return in about 6 months (around 09/24/2022).     I, LMikey Kirschner PA-C have reviewed all documentation for this visit. The documentation on  03/24/2022 for the exam, diagnosis, procedures, and orders are all accurate and complete.  LMikey Kirschner PA-C [facility] [place] [address]Office: [phone]: [phone]

## 2022-03-24 NOTE — Assessment & Plan Note (Signed)
Reviewed recent labs from oncology. Will get fasting lipids to screen for hyperlipidemia.

## 2022-04-01 ENCOUNTER — Ambulatory Visit
Admission: RE | Admit: 2022-04-01 | Discharge: 2022-04-01 | Disposition: A | Discharge status: Home / Self Care | Payer: Medicare Other | Source: Ambulatory Visit | Attending: Radiation Oncology | Admitting: Radiation Oncology

## 2022-04-01 ENCOUNTER — Encounter: Payer: Self-pay | Admitting: Radiation Oncology

## 2022-04-01 VITALS — BP 123/76 | HR 66 | Temp 97.7°F | Resp 18 | Ht 67.0 in | Wt 170.0 lb

## 2022-04-01 DIAGNOSIS — Z923 Personal history of irradiation: Secondary | ICD-10-CM | POA: Insufficient documentation

## 2022-04-01 DIAGNOSIS — Z8542 Personal history of malignant neoplasm of other parts of uterus: Secondary | ICD-10-CM | POA: Insufficient documentation

## 2022-04-01 DIAGNOSIS — C541 Malignant neoplasm of endometrium: Secondary | ICD-10-CM

## 2022-04-01 NOTE — Progress Notes (Signed)
Radiation Oncology Follow up Note  Name: Katherine Snyder   Date:   04/01/2022 MRN:  [id number] DOB: September 21, 1951    This 71 y.o. female presents to the clinic today for 3-year follow-up status post vaginal brachytherapy for stage I carcinosarcoma of the uterus status post robotic assisted hysterectomy.  REFERRING PROVIDER: Trinna Post, PA-C  HPI: Patient is a 71 year old female now out over 3 years having completed vaginal brachytherapy for stage I carcinosarcoma of the uterus status post robotic assisted hysterectomy.  Seen today in routine follow-up she is doing well.  She specifically denies any vaginal discharge vaginal pain any increased lower urinary tract symptoms or diarrhea..  Patient also received 6 cycles of CarboTaxol.  She has been followed by CT scans of her chest showing waxing waning nodules in the posterior left Apex.  COMPLICATIONS OF TREATMENT: none  FOLLOW UP COMPLIANCE: keeps appointments   PHYSICAL EXAM:  BP 123/76   Pulse 66   Temp 97.7 F (36.5 C)   Resp 18   Ht '5\' 7"'$  (1.702 m)   Wt 170 lb (77.1 kg)   BMI 26.63 kg/m  Well-developed well-nourished patient in NAD. HEENT reveals PERLA, EOMI, discs not visualized.  Oral cavity is clear. No oral mucosal lesions are identified. Neck is clear without evidence of cervical or supraclavicular adenopathy. Lungs are clear to A&P. Cardiac examination is essentially unremarkable with regular rate and rhythm without murmur rub or thrill. Abdomen is benign with no organomegaly or masses noted. Motor sensory and DTR levels are equal and symmetric in the upper and lower extremities. Cranial nerves II through XII are grossly intact. Proprioception is intact. No peripheral adenopathy or edema is identified. No motor or sensory levels are noted. Crude visual fields are within normal range.  RADIOLOGY RESULTS: CT scans serial are reviewed compatible with above-stated findings  PLAN: Patient is now out over 3 years with no evidence  of disease for stage I carcinosarcoma of the uterus.  On pleased with her overall progress look very low side effect profile.  I will turn follow-up care over to medical oncology and GYN oncology.  I be happy to reevaluate the patient anytime should that be indicated.  She knows to call with any concerns.  I would like to take this opportunity to thank you for allowing me to participate in the care of your patient.Noreene Filbert, MD

## 2022-05-03 ENCOUNTER — Ambulatory Visit
Admission: RE | Admit: 2022-05-03 | Discharge: 2022-05-03 | Disposition: A | Discharge status: Home / Self Care | Payer: Medicare Other | Source: Ambulatory Visit | Attending: Physician Assistant | Admitting: Physician Assistant

## 2022-05-03 DIAGNOSIS — Z1231 Encounter for screening mammogram for malignant neoplasm of breast: Secondary | ICD-10-CM | POA: Insufficient documentation

## 2022-06-05 ENCOUNTER — Other Ambulatory Visit: Payer: Self-pay | Admitting: Physician Assistant

## 2022-06-05 DIAGNOSIS — J302 Other seasonal allergic rhinitis: Secondary | ICD-10-CM

## 2022-06-09 ENCOUNTER — Other Ambulatory Visit: Payer: Self-pay | Admitting: Physician Assistant

## 2022-06-09 DIAGNOSIS — J302 Other seasonal allergic rhinitis: Secondary | ICD-10-CM

## 2022-08-03 ENCOUNTER — Ambulatory Visit: Payer: Medicare Other

## 2022-08-31 ENCOUNTER — Inpatient Hospital Stay: Payer: Medicare Other | Attending: Obstetrics and Gynecology | Admitting: Obstetrics and Gynecology

## 2022-08-31 ENCOUNTER — Encounter: Payer: Self-pay | Admitting: Obstetrics and Gynecology

## 2022-08-31 VITALS — BP 139/71 | HR 60 | Temp 98.4°F | Resp 17 | Wt 168.6 lb

## 2022-08-31 DIAGNOSIS — C541 Malignant neoplasm of endometrium: Secondary | ICD-10-CM

## 2022-08-31 DIAGNOSIS — R918 Other nonspecific abnormal finding of lung field: Secondary | ICD-10-CM | POA: Diagnosis not present

## 2022-08-31 DIAGNOSIS — Z90710 Acquired absence of both cervix and uterus: Secondary | ICD-10-CM | POA: Insufficient documentation

## 2022-08-31 DIAGNOSIS — F17210 Nicotine dependence, cigarettes, uncomplicated: Secondary | ICD-10-CM | POA: Insufficient documentation

## 2022-08-31 DIAGNOSIS — Z9221 Personal history of antineoplastic chemotherapy: Secondary | ICD-10-CM | POA: Insufficient documentation

## 2022-08-31 DIAGNOSIS — Z923 Personal history of irradiation: Secondary | ICD-10-CM | POA: Insufficient documentation

## 2022-08-31 DIAGNOSIS — K7689 Other specified diseases of liver: Secondary | ICD-10-CM | POA: Diagnosis not present

## 2022-08-31 DIAGNOSIS — Z90722 Acquired absence of ovaries, bilateral: Secondary | ICD-10-CM | POA: Insufficient documentation

## 2022-08-31 DIAGNOSIS — Z08 Encounter for follow-up examination after completed treatment for malignant neoplasm: Secondary | ICD-10-CM | POA: Diagnosis not present

## 2022-08-31 DIAGNOSIS — Z8542 Personal history of malignant neoplasm of other parts of uterus: Secondary | ICD-10-CM | POA: Diagnosis present

## 2022-08-31 NOTE — Progress Notes (Signed)
Gynecologic Oncology Interval Visit   Referring Provider: Dr. Amalia Hailey  Chief Complaint: Endometrial Cancer  Subjective:  Katherine Snyder is a 71 y.o. 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  She is doing well without complaints. She is a smoker. She did try to stop but restarted smoking again.  She would like to try to stop. H/o chronic leg swelling which is no worse.    IV port removed.   She is not seeing Dr. Amalia Hailey at this time. She is following up with Dr. Tasia Catchings 09/20/21 and with Dr. Baruch Gouty 04/01/22.   GYNECOLOGIC ONCOLOGY HISTORY:  Initially, patient presented to ER on 02/26/18 for PMB. She was reportedly 14 years post menopausal and had had a 2 day episode of vaginal bleeding that was constant.   02/26/18- Ultrasound Pelvic Complete w/ Transvaginal: Endometrium thickness up to 70m. No focal solid or cystic change. IMPRESSION: Mildly enlarged uterus. No discrete fibroids. Markedly thickened endometrium. In the setting of post-menopausal bleeding, endometrial sampling is indicated to exclude carcinoma. If results are benign, sonohysterogram should be considered for focal lesion work-up. (Ref: Radiological Reasoning: Algorithmic Workup of Abnormal Vaginal Bleeding with Endovaginal Sonography and Sonohysterography. AJR 2008; 1989:Q11-94 Nonvisualization of the right ovary. Normal-appearing left ovary. No significant uterine fibroids were observed.  She was seen by Dr. EEllard Artisson 03/01/18 who performed vacurette endometrial biopsy. Uterus was sounded to length to 9 cm. She was started on Aygestin and bleeding stopped.   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  COMMENT: BASED ON INITIAL HISTOLOGIC  FINDINGS, IMMUNOHISTOCHEMISTRY IS EVALUATED:  THE SARCOMATOUS TUMOR COMPONENT IS DIFFUSELY POSITIVE FOR CD10 AND THE CARCINOMA COMPONENT IS POSITIVE FOR PANKERATIN, SUPPORTING THE DIAGNOSIS.   She was seen by Dr. BFransisca Connors Gyn-Onc on 03/28/2018 for initial evaluation and treatment planning.  TLH BSO with sentinel lymph node mapping and biopsies and aortic lymph node dissection were discussed.  [facility]   CA 125- 10.5  03/30/18 - CT C/A/P W CONTRAST IMPRESSION: 1. Mass like expansion of the endometrial cavity known primary endometrial carcinoma. 2. No evidence for nodal metastasis or solid organ metastasis within the abdomen or pelvis. 3. Scattered nonspecific solid nodules and a single part solid nodule identified in both lungs. Consensus criteria recommendations for nodule followup does not apply to patients with known primary malignancy. 4. Liver cysts.  She underwent CT Imaging and then robot assisted total hysterectomy with sentinel LN mapping, biopsies, and aortic LN dissection [facility]   A. UTERUS WITH CERVIX; HYSTERECTOMY:  - CARCINOSARCOMA.  - MYOMETRIAL INVASION PRESENT, LESS THAN HALF OF THE MYOMETRIUM.  - NEGATIVE FOR CERVICAL AND SEROSAL INVOLVEMENT.  - SUBMUCOSAL LEIOMYOMA.   FALLOPIAN TUBE AND OVARY, LEFT; SALPINGO-OOPHORECTOMY:  - NEGATIVE FOR MALIGNANCY.  - OVARIAN CORTICAL INCLUSION CYSTS.  - ATROPHIC FALLOPIAN TUBE.   B. SENTINEL LYMPH NODE, RIGHT PRIMARY OBTURATOR; EXCISION:  - NEGATIVE FOR MALIGNANCY, ONE LYMPH NODE (0/1).   C. LYMPH NODE, RIGHT EXTERNAL ILIAC VEIN; EXCISION:  - NEGATIVE FOR MALIGNANCY, ONE LYMPH NODE (0/1).   D. FALLOPIAN TUBE AND OVARY, UNILATERAL (RIGHT); SALPINGO-OOPHORECTOMY:  - NEGATIVE FOR MALIGNANCY.  - OVARIAN CORTICAL INCLUSION CYSTS.  - ATROPHIC FALLOPIAN TUBE.   E. SENTINEL LYMPH NODE,  RIGHT LOW PARA-AORTIC; EXCISION:  - NEGATIVE FOR  MALIGNANCY, TWO LYMPH NODES (0/2).   F. SENTINEL LYMPH NODE, LEFT PRIMARY LOW PARA-AORTIC; EXCISION:  - NEGATIVE FOR MALIGNANCY, ONE LYMPH NODE (0/1).   DIAGNOSIS:  A.  PELVIC WASHINGS:  - NEGATIVE FOR MALIGNANCY.    Her case was discussed at [place] and discussed that recurrence risk without adjuvant treatment is up to 50%.  So recommend adjuvant chemo and radiation.  She was seen by Dr. Baruch Gouty, for initial evaluation of stage IA uterine carcinosarcoma and plan is for brachytherapy after 6 cycles of carbo/taxol.    She initiated carbo-Taxol on 04/25/2018.  Completed 6 cycles on 08/08/2018.  She received vaginal brachytherapy with Dr. Baruch Gouty 10/19-11/09/2018.   She had thyroid ultrasound for incidental thyroid nodule seen on surveillance imaging for indeterminate pulmonary nodules on 5/20. Biopsy was benign.   07/11/18- CT Chest WO Contrast for follow-up on pulmonary nodules IMPRESSION: 1. Previously visualized pulmonary nodules are either stable or resolved. Several new subcentimeter bilateral pulmonary nodules. Pulmonary nodularity generally appears centrilobular in distribution and while considered indeterminate is more suggestive of infectious/inflammatory nodularity. Continued chest CT surveillance is recommended in 3-6 months. 2. No thoracic adenopathy.  12/10/2018- CT Chest wo contrast - New sub solid density measuring 12 mm with associated 3 mm solid nodule is noted in left upper lobe. There also noted several other new nodules noted more posteriorly in the left upper lobe, with the largest measuring 3 mm. These may simply be inflammatory in etiology, but neoplasm can not be excluded. Initial follow-up by chest CT without contrast is recommended in 3 months to confirm persistence. - Stable sub solid density is noted in right upper lobe with grossly stable 4 mm solid nodule. Multiple other pulmonary nodules are noted in both lungs which are stable compared to prior exam - Small  nonobstructive right renal calculus - Stable left thyroid nodule - Stable hepatic cyst  She stopped smoking 2/20, but occasionally using vaping products.    CT scan chest 5/20 IMPRESSION: 1. Stable scattered pulmonary nodules as detailed above including a 1.2 cm part solid nodule in the right upper lobe. The solid component remains stable at approximately 0.4 cm. Follow-up non-contrast CT recommended at 3-6 months to confirm persistence. If unchanged, and solid component remains <6 mm, annual CT is recommended until 5 years of stability has been established. If persistent these nodules should be considered highly suspicious if the solid component of the nodule is 6 mm or greater in size and enlarging. This recommendation follows the consensus statement: Guidelines for Management of Incidental Pulmonary Nodules Detected on CT Images: From the Fleischner Society 2017; Radiology 2017; 284:228-243. 2. Additional scattered pulmonary nodules as detailed above measuring up to approximately 8 mm. These are stable from prior study. Attention on yearly follow-up examinations is recommended. 3. Bilateral nonobstructing renal nephroliths are again noted.  4. Mild bilateral interlobular septal thickening consistent with mild volume overload.  CT scan 8/21 IMPRESSION: 1. No acute intrathoracic, abdominal, or pelvic pathology. 2. No evidence of metastatic disease in the chest, abdomen, or pelvis. 3. Bilateral pulmonary nodules similar to prior CT. 4. Small nonobstructing right renal interpolar calculus. 5. Colonic diverticulosis. No bowel obstruction. 6. Aortic Atherosclerosis (ICD10-I70.0).  Primary care Colonoscopy on 03/13/20 with benign polyps and she will repeat in 5 years. Mammogram 03/09/21 was negative.   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   Menopausal symptom 09/21/2007   Tobacco use 09/21/2007   Awareness of heartbeats 09/21/2007    Past Medical History: Past Medical History:  Diagnosis Date   Arthritis    Cancer ([place]) 2019   endometrial   Endometrial mass    History of chicken pox    History of kidney stones    History of measles    History of mumps    Osteoporosis    Personal history of chemotherapy    Personal history of radiation therapy    PMB (postmenopausal bleeding)    Psoriasis     Past Surgical History: Past Surgical History:  Procedure Laterality Date   APPENDECTOMY  1960   BREAST EXCISIONAL BIOPSY Left 1980   neg   BREAST SURGERY Left    biopsy   COLONOSCOPY WITH PROPOFOL N/A 03/13/2020   Procedure: COLONOSCOPY WITH PROPOFOL;  Surgeon: Jonathon Bellows, MD;  Location: [facility] ENDOSCOPY;  Service: Gastroenterology;  Laterality: N/A;   Injection varicose veins in legs Bilateral 2010   Dr. Hulda Humphrey   LITHOTRIPSY  1994, 2004   for renal stones   PORTA CATH INSERTION N/A 04/24/2018   Procedure: PORTA CATH INSERTION;  Surgeon: Katha Cabal, MD;  Location: [place] CV LAB;  Service: Cardiovascular;  Laterality: N/A;   PORTA CATH REMOVAL N/A 01/18/2021   Procedure: PORTA CATH REMOVAL;  Surgeon: Algernon Huxley, MD;  Location: [place] CV LAB;  Service: Cardiovascular;  Laterality: N/A;   ROBOTIC ASSISTED TOTAL HYSTERECTOMY Bilateral 04/04/2018   Procedure: ROBOTIC ASSISTED TOTAL HYSTERECTOMY,SENTINEL LYMPH NODE MAPPING AND BIOPSIES,AORTIC LYMPH NODE  DISSECTION;  Surgeon: Gillis Ends, MD;  Location: [facility] ORS;  Service: Gynecology;  Laterality: Bilateral;   TUBAL LIGATION  1980    OB History:  OB History  Gravida Para Term Preterm AB Living  '1 1 1     1  '$ SAB IAB Ectopic Multiple Live Births          1    # Outcome Date GA Lbr Len/2nd Weight Sex Delivery Anes PTL Lv  1 Term 28    M Vag-Spont   LIV  G1P1001 LMP:   Family History: Family History  Problem Relation Age of Onset   Breast cancer Mother    Transient ischemic attack Mother    Arthritis Mother    Hypothyroidism Mother    Lung cancer Mother    Diabetes Father        type 2   Hypertension Father    Arthritis Father    Colon cancer Sister    Prostate cancer Brother    Hyperlipidemia Son    Stomach cancer Maternal Grandmother    Stroke Maternal Grandfather     Social History: Social History   Socioeconomic History   Marital status: Divorced    Spouse name: single   Number of children: 1   Years of education: Not on file   Highest education level: 10th grade  Occupational History   Occupation: retired  Tobacco Use  Smoking status: Every Day    Packs/day: 0.25    Years: 30.00    Total pack years: 7.50    Types: Cigarettes   Smokeless tobacco: Never  Vaping Use   Vaping Use: Some days  Substance and Sexual Activity   Alcohol use: No    Alcohol/week: 0.0 standard drinks of alcohol   Drug use: No   Sexual activity: Not Currently  Other Topics Concern   Not on file  Social History Narrative   Not on file   Social Determinants of Health   Financial Resource Strain: Low Risk  (12/15/2021)   Overall Financial Resource Strain (CARDIA)    Difficulty of Paying Living Expenses: Not hard at all  Food Insecurity: No Food Insecurity (12/15/2021)   Hunger Vital Sign    Worried About Running Out of Food in the Last Year: Never true    Ran Out of Food in the Last Year: Never true  Transportation Needs: No Transportation Needs (12/15/2021)    PRAPARE - Hydrologist (Medical): No    Lack of Transportation (Non-Medical): No  Physical Activity: Inactive (12/15/2021)   Exercise Vital Sign    Days of Exercise per Week: 0 days    Minutes of Exercise per Session: 0 min  Stress: Stress Concern Present (12/15/2021)   [place]    Feeling of Stress : To some extent  Social Connections: Socially Isolated (12/15/2021)   Social Connection and Isolation Panel [NHANES]    Frequency of Communication with Friends and Family: Twice a week    Frequency of Social Gatherings with Friends and Family: Three times a week    Attends Religious Services: Never    Active Member of Clubs or Organizations: No    Attends Archivist Meetings: Never    Marital Status: Divorced  Human resources officer Violence: Not At Risk (12/15/2021)   Humiliation, Afraid, Rape, and Kick questionnaire    Fear of Current or Ex-Partner: No    Emotionally Abused: No    Physically Abused: No    Sexually Abused: No    Allergies: No Known Allergies  Current Medications: Current Outpatient Medications  Medication Sig Dispense Refill   Azelastine HCl 137 MCG/SPRAY SOLN PLACE 1 SPRAY INTO BOTH NOSTRILS 2 (TWO) TIMES DAILY. USE IN EACH NOSTRIL AS DIRECTED 30 mL 1   betamethasone dipropionate 0.05 % cream Apply 1 application topically 2 (two) times daily as needed (for psoriasis on body). 30 g 1   DULoxetine (CYMBALTA) 30 MG capsule TAKE 1 CAPSULE BY MOUTH EVERY DAY 90 capsule 1   FLUZONE HIGH-DOSE QUADRIVALENT 0.7 ML SUSY      loratadine (CLARITIN) 10 MG tablet Take 1 tablet (10 mg total) by mouth daily. 90 tablet 1   MODERNA COVID-19 BIVAL BOOSTER 50 MCG/0.5ML injection      Multiple Vitamins-Minerals (CENTRUM SILVER ADULT 50+ PO) Take 1 tablet by mouth daily.     naproxen sodium (ALEVE) 220 MG tablet Take 220 mg by mouth daily as needed.     triamcinolone (KENALOG) 0.025 % cream  Apply 1 application. topically daily as needed (for areas on face. avoid eyes). 80 g 1   No current facility-administered medications for this visit.   Review of Systems General: no complaints  HEENT: no complaints  Lungs: no complaints  Cardiac: no complaints  GI: no complaints  GU: no complaints  Musculoskeletal: no complaints  Extremities: no complaints  Skin: no complaints  Neuro: no complaints  Endocrine: no complaints  Psych: no complaints      Objective:  Physical Examination:  Vitals:   08/31/22 1322  BP: 139/71  Pulse: 60  Resp: 17  Temp: 98.4 F (36.9 C)  SpO2: 97%   Pain 9/10- pelvic-lower   ECOG Performance Status: 0  GENERAL: Patient is a well appearing female in no acute distress HEENT:  PERRL, neck supple with midline trachea. No masses.  NODES:  No cervical, supraclavicular, axillary, or inguinal lymphadenopathy palpated.  LUNGS:  Clear to auscultation bilaterally.   HEART:  Regular rate and rhythm.  CHEST: Port removed. Scar well healed.  ABDOMEN:  Soft, nontender, nondistended. No masses/ascites/hernia MSK:  Full range of motion bilaterally in the upper extremities. EXTREMITIES:  No peripheral edema.   SKIN:  Clear with no obvious rashes or skin changes. No nail dyscrasia. NEURO:  Nonfocal. Well oriented.  Appropriate affect.  Pelvic: chaperoned by CMA EGBUS: no lesions Cervix: surgically absent Vagina: no lesions, no discharge or bleeding Uterus: surgically absent BME: no palpable masses Rectovaginal: deferred  CT scan 3/23 IMPRESSION: 1. Interval resolution of previously seen new nodules in the posterior left apex.   2. New small bilateral pulmonary nodules measuring 0.6 cm and smaller. Occasional additional unchanged nodules scattered bilaterally.   3. Areas of clustered centrilobular and tree-in-bud nodularity bilaterally, as well as mild, bandlike scarring and consolidation of the medial segment right middle lobe and lingula.    4. Fluctuance of findings is consistent with a benign, infectious or inflammatory etiology, and constellation is particularly suggestive of atypical mycobacterial infection.    Assessment:  Katherine Snyder is a 71 y.o. female diagnosed with stage IA uterine carcinosarcoma with RA TLH/BSO, negative staging 5/19 followed by vaginal brachytherapy and 6 cycles of carbo/taxol chemotherapy completed 08/08/2018.  NED on exam today.    Chest CTs have  shown indeterminate lung nodules, some disapearing over time and some new thought to be benign.  She is a current smoker.   Hepatic cyst, stable on CT.   H/o nonobstructing kidney stones on CT   Comorbidities complicating care: smoker, plantar fasciitis  Plan:   Problem List Items Addressed This Visit       Genitourinary   Endometrial cancer ([place]) - Primary   Previously reviewed NCCN guidelines for surveillance including physical exam every 3-6 months for 2- 3 years then every 6 months for years 4-5 then annually thereafter.  We will alternate with Dr. Tasia Catchings who will see her in 6 months and we will see her back in 12 months. She no longer sees Dr. Baruch Gouty.  She agrees with this plan.   A total of 25 minutes were spent with the patient/family today; >50% was spent in education, counseling and coordination of care for h/o endometrial cancer, pulmonary nodules, smoking cessation, hepatic cysts, and kidney stones.   Mellody Drown, MD

## 2022-09-05 ENCOUNTER — Encounter (INDEPENDENT_AMBULATORY_CARE_PROVIDER_SITE_OTHER): Payer: Self-pay

## 2022-09-21 ENCOUNTER — Ambulatory Visit: Payer: Medicare Other | Admitting: Oncology

## 2022-09-21 ENCOUNTER — Other Ambulatory Visit: Payer: Medicare Other

## 2022-10-05 ENCOUNTER — Encounter: Payer: Self-pay | Admitting: Physician Assistant

## 2022-10-05 ENCOUNTER — Ambulatory Visit (INDEPENDENT_AMBULATORY_CARE_PROVIDER_SITE_OTHER): Payer: Medicare Other | Admitting: Physician Assistant

## 2022-10-05 VITALS — BP 129/65 | HR 73 | Temp 98.2°F | Wt 167.8 lb

## 2022-10-05 DIAGNOSIS — E663 Overweight: Secondary | ICD-10-CM | POA: Diagnosis not present

## 2022-10-05 DIAGNOSIS — Z716 Tobacco abuse counseling: Secondary | ICD-10-CM | POA: Diagnosis not present

## 2022-10-05 DIAGNOSIS — Z6826 Body mass index (BMI) 26.0-26.9, adult: Secondary | ICD-10-CM | POA: Diagnosis not present

## 2022-10-05 NOTE — Assessment & Plan Note (Signed)
Again encouraged smoking cessation Advised consequences, cancer risk Last CT chest 3/23

## 2022-10-05 NOTE — Progress Notes (Signed)
Established patient visit   Patient: Katherine Snyder   DOB: Oct 24, 1951   71 y.o. Female  MRN: [id number] Visit Date: 10/05/2022  Today's healthcare provider: Mikey Kirschner, PA-C  I,Alexx Giambra,acting as a scribe for Mikey Kirschner, PA-C.,have documented all relevant documentation on the behalf of Mikey Kirschner, PA-C,as directed by  Mikey Kirschner, PA-C while in the presence of Mikey Kirschner, PA-C.   Chief Complaint  Patient presents with   Follow-up   Subjective    HPI  Katherine Snyder is a 71 y.o. female that presents today for a 6 month follow up. Pt states that there is no changes and she has no problems to discuss today. Medications: Outpatient Medications Prior to Visit  Medication Sig   betamethasone dipropionate 0.05 % cream Apply 1 application topically 2 (two) times daily as needed (for psoriasis on body).   DULoxetine (CYMBALTA) 30 MG capsule TAKE 1 CAPSULE BY MOUTH EVERY DAY   FLUZONE HIGH-DOSE QUADRIVALENT 0.7 ML SUSY    Multiple Vitamins-Minerals (CENTRUM SILVER ADULT 50+ PO) Take 1 tablet by mouth daily.   naproxen sodium (ALEVE) 220 MG tablet Take 220 mg by mouth daily as needed.   triamcinolone (KENALOG) 0.025 % cream Apply 1 application. topically daily as needed (for areas on face. avoid eyes).   [DISCONTINUED] MODERNA COVID-19 BIVAL BOOSTER 50 MCG/0.5ML injection    [DISCONTINUED] Azelastine HCl 137 MCG/SPRAY SOLN PLACE 1 SPRAY INTO BOTH NOSTRILS 2 (TWO) TIMES DAILY. USE IN EACH NOSTRIL AS DIRECTED (Patient not taking: Reported on 10/05/2022)   [DISCONTINUED] loratadine (CLARITIN) 10 MG tablet Take 1 tablet (10 mg total) by mouth daily. (Patient not taking: Reported on 10/05/2022)   No facility-administered medications prior to visit.    Review of Systems  Constitutional:  Negative for fatigue and fever.  HENT:  Positive for congestion and sore throat.   Respiratory:  Negative for cough and shortness of breath.   Cardiovascular:  Negative for chest  pain and leg swelling.  Gastrointestinal:  Negative for abdominal pain.  Neurological:  Negative for dizziness and headaches.        Objective    BP 129/65 (BP Location: Right Arm, Patient Position: Sitting, Cuff Size: Normal)   Pulse 73   Temp 98.2 F (36.8 C) (Oral)   Wt 167 lb 12.8 oz (76.1 kg)   SpO2 98%   BMI 26.28 kg/m     Physical Exam Vitals reviewed.  Constitutional:      Appearance: She is not ill-appearing.  HENT:     Head: Normocephalic.  Eyes:     Conjunctiva/sclera: Conjunctivae normal.  Cardiovascular:     Rate and Rhythm: Normal rate.  Pulmonary:     Effort: Pulmonary effort is normal. No respiratory distress.  Neurological:     General: No focal deficit present.     Mental Status: She is alert and oriented to person, place, and time.  Psychiatric:        Mood and Affect: Mood normal.        Behavior: Behavior normal.     No results found for any visits on 10/05/22.  Assessment & Plan     Problem List Items Addressed This Visit       Other   Encounter for smoking cessation counseling    Again encouraged smoking cessation Advised consequences, cancer risk Last CT chest 3/23       Overweight with body mass index (BMI) of 26 to 26.9 in adult - Primary  Again ordered fasting lipids to screen for HLD To note; pt had aortic atherosclerosis on chest CT and is a smoker. Despite low LDL would likely benefit from statin protection The 10-year ASCVD risk score (Arnett DK, et al., 2019) is: 13.5%  Will wait for lipid results to determine dosing      Relevant Orders   Lipid Profile    Return in about 6 months (around 04/05/2023) for CPE.      I, Mikey Kirschner, PA-C have reviewed all documentation for this visit. The documentation on  10/05/2022  for the exam, diagnosis, procedures, and orders are all accurate and complete.  Mikey Kirschner, PA-C [facility] [address] [place], [place], [address] Office:  [phone] Fax: [place]

## 2022-10-05 NOTE — Assessment & Plan Note (Addendum)
Again ordered fasting lipids to screen for HLD To note; pt had aortic atherosclerosis on chest CT and is a smoker. Despite low LDL would likely benefit from statin protection The 10-year ASCVD risk score (Arnett DK, et al., 2019) is: 13.5%  Will wait for lipid results to determine dosing

## 2022-12-19 ENCOUNTER — Ambulatory Visit (INDEPENDENT_AMBULATORY_CARE_PROVIDER_SITE_OTHER): Payer: Medicare Other

## 2022-12-19 VITALS — Ht 68.0 in | Wt 173.0 lb

## 2022-12-19 DIAGNOSIS — Z0000 Encounter for general adult medical examination without abnormal findings: Secondary | ICD-10-CM

## 2022-12-19 DIAGNOSIS — Z1231 Encounter for screening mammogram for malignant neoplasm of breast: Secondary | ICD-10-CM

## 2022-12-19 NOTE — Patient Instructions (Addendum)
[name] , Thank you for taking time to come for your Medicare Wellness Visit. I appreciate your ongoing commitment to your health goals. Please review the following plan we discussed and let me know if I can assist you in the future.   These are the goals we discussed:  Goals      DIET - EAT MORE FRUITS AND VEGETABLES     DIET - INCREASE WATER INTAKE     Recommend to drink at least 6-8 8oz glasses of water per day.        This is a list of the screening recommended for you and due dates:  Health Maintenance  Topic Date Due   Zoster (Shingles) Vaccine (1 of 2) Never done   COVID-19 Vaccine (4 - 2023-24 season) 07/08/2022   Flu Shot  02/05/2023*   Mammogram  05/04/2023   Medicare Annual Wellness Visit  12/20/2023   DEXA scan (bone density measurement)  02/02/2024   Colon Cancer Screening  03/13/2025   DTaP/Tdap/Td vaccine (3 - Td or Tdap) 01/27/2029   Pneumonia Vaccine  Completed   Hepatitis C Screening: USPSTF Recommendation to screen - Ages 61-79 yo.  Completed   HPV Vaccine  Aged Out  *Topic was postponed. The date shown is not the original due date.    Advanced directives: no  Conditions/risks identified: none  Next appointment: Follow up in one year for your annual wellness visit 12/20/2023 @11$ :00am in person   Preventive Care 65 Years and Older, Female Preventive care refers to lifestyle choices and visits with your health care provider that can promote health and wellness. What does preventive care include? A yearly physical exam. This is also called an annual well check. Dental exams once or twice a year. Routine eye exams. Ask your health care provider how often you should have your eyes checked. Personal lifestyle choices, including: Daily care of your teeth and gums. Regular physical activity. Eating a healthy diet. Avoiding tobacco and drug use. Limiting alcohol use. Practicing safe sex. Taking low-dose aspirin every day. Taking vitamin and mineral  supplements as recommended by your health care provider. What happens during an annual well check? The services and screenings done by your health care provider during your annual well check will depend on your age, overall health, lifestyle risk factors, and family history of disease. Counseling  Your health care provider may ask you questions about your: Alcohol use. Tobacco use. Drug use. Emotional well-being. Home and relationship well-being. Sexual activity. Eating habits. History of falls. Memory and ability to understand (cognition). Work and work Statistician. Reproductive health. Screening  You may have the following tests or measurements: Height, weight, and BMI. Blood pressure. Lipid and cholesterol levels. These may be checked every 5 years, or more frequently if you are over 76 years old. Skin check. Lung cancer screening. You may have this screening every year starting at age 95 if you have a 30-pack-year history of smoking and currently smoke or have quit within the past 15 years. Fecal occult blood test (FOBT) of the stool. You may have this test every year starting at age 83. Flexible sigmoidoscopy or colonoscopy. You may have a sigmoidoscopy every 5 years or a colonoscopy every 10 years starting at age 55. Hepatitis C blood test. Hepatitis B blood test. Sexually transmitted disease (STD) testing. Diabetes screening. This is done by checking your blood sugar (glucose) after you have not eaten for a while (fasting). You may have this done every 1-3 years. Bone density  scan. This is done to screen for osteoporosis. You may have this done starting at age 52. Mammogram. This may be done every 1-2 years. Talk to your health care provider about how often you should have regular mammograms. Talk with your health care provider about your test results, treatment options, and if necessary, the need for more tests. Vaccines  Your health care provider may recommend certain  vaccines, such as: Influenza vaccine. This is recommended every year. Tetanus, diphtheria, and acellular pertussis (Tdap, Td) vaccine. You may need a Td booster every 10 years. Zoster vaccine. You may need this after age 77. Pneumococcal 13-valent conjugate (PCV13) vaccine. One dose is recommended after age 16. Pneumococcal polysaccharide (PPSV23) vaccine. One dose is recommended after age 71. Talk to your health care provider about which screenings and vaccines you need and how often you need them. This information is not intended to replace advice given to you by your health care provider. Make sure you discuss any questions you have with your health care provider. Document Released: 11/20/2015 Document Revised: 07/13/2016 Document Reviewed: 08/25/2015 Elsevier Interactive Patient Education  2017 [place] Prevention in the Home Falls can cause injuries. They can happen to people of all ages. There are many things you can do to make your home safe and to help prevent falls. What can I do on the outside of my home? Regularly fix the edges of walkways and driveways and fix any cracks. Remove anything that might make you trip as you walk through a door, such as a raised step or threshold. Trim any bushes or trees on the path to your home. Use bright outdoor lighting. Clear any walking paths of anything that might make someone trip, such as rocks or tools. Regularly check to see if handrails are loose or broken. Make sure that both sides of any steps have handrails. Any raised decks and porches should have guardrails on the edges. Have any leaves, snow, or ice cleared regularly. Use sand or salt on walking paths during winter. Clean up any spills in your garage right away. This includes oil or grease spills. What can I do in the bathroom? Use night lights. Install grab bars by the toilet and in the tub and shower. Do not use towel bars as grab bars. Use non-skid mats or decals in  the tub or shower. If you need to sit down in the shower, use a plastic, non-slip stool. Keep the floor dry. Clean up any water that spills on the floor as soon as it happens. Remove soap buildup in the tub or shower regularly. Attach bath mats securely with double-sided non-slip rug tape. Do not have throw rugs and other things on the floor that can make you trip. What can I do in the bedroom? Use night lights. Make sure that you have a light by your bed that is easy to reach. Do not use any sheets or blankets that are too big for your bed. They should not hang down onto the floor. Have a firm chair that has side arms. You can use this for support while you get dressed. Do not have throw rugs and other things on the floor that can make you trip. What can I do in the kitchen? Clean up any spills right away. Avoid walking on wet floors. Keep items that you use a lot in easy-to-reach places. If you need to reach something above you, use a strong step stool that has a grab bar. Keep electrical  cords out of the way. Do not use floor polish or wax that makes floors slippery. If you must use wax, use non-skid floor wax. Do not have throw rugs and other things on the floor that can make you trip. What can I do with my stairs? Do not leave any items on the stairs. Make sure that there are handrails on both sides of the stairs and use them. Fix handrails that are broken or loose. Make sure that handrails are as long as the stairways. Check any carpeting to make sure that it is firmly attached to the stairs. Fix any carpet that is loose or worn. Avoid having throw rugs at the top or bottom of the stairs. If you do have throw rugs, attach them to the floor with carpet tape. Make sure that you have a light switch at the top of the stairs and the bottom of the stairs. If you do not have them, ask someone to add them for you. What else can I do to help prevent falls? Wear shoes that: Do not have high  heels. Have rubber bottoms. Are comfortable and fit you well. Are closed at the toe. Do not wear sandals. If you use a stepladder: Make sure that it is fully opened. Do not climb a closed stepladder. Make sure that both sides of the stepladder are locked into place. Ask someone to hold it for you, if possible. Clearly mark and make sure that you can see: Any grab bars or handrails. First and last steps. Where the edge of each step is. Use tools that help you move around (mobility aids) if they are needed. These include: Canes. Walkers. Scooters. Crutches. Turn on the lights when you go into a dark area. Replace any light bulbs as soon as they burn out. Set up your furniture so you have a clear path. Avoid moving your furniture around. If any of your floors are uneven, fix them. If there are any pets around you, be aware of where they are. Review your medicines with your doctor. Some medicines can make you feel dizzy. This can increase your chance of falling. Ask your doctor what other things that you can do to help prevent falls. This information is not intended to replace advice given to you by your health care provider. Make sure you discuss any questions you have with your health care provider. Document Released: 08/20/2009 Document Revised: 03/31/2016 Document Reviewed: 11/28/2014 Elsevier Interactive Patient Education  2017 [employer].

## 2022-12-19 NOTE — Progress Notes (Signed)
I connected with  [name] on 12/19/22 by a audio enabled telemedicine application and verified that I am speaking with the correct person using two identifiers.  Patient Location: Home  Provider Location: Home/office  I discussed the limitations of evaluation and management by telemedicine. The patient expressed understanding and agreed to proceed.  Subjective:   Katherine Snyder is a 72 y.o. female who presents for Medicare Annual (Subsequent) preventive examination.  Review of Systems     Cardiac Risk Factors include: advanced age (>29mn, >>84women);sedentary lifestyle;smoking/ tobacco exposure    Objective:    Today's Vitals   12/19/22 1023  Weight: 173 lb (78.5 kg)  Height: 5' 8"$  (1.727 m)   Body mass index is 26.3 kg/m.     12/19/2022   10:31 AM 08/31/2022    1:21 PM 03/21/2022   10:09 AM 12/29/2021   10:47 AM 12/15/2021   11:15 AM 06/30/2021    1:22 PM 03/19/2021    9:54 AM  Advanced Directives  Does Patient Have a Medical Advance Directive? No No No No No No No  Would patient like information on creating a medical advance directive? Yes (ED - Information included in AVS) No - Patient declined No - Patient declined No - Patient declined No - Patient declined No - Patient declined     Current Medications (verified) Outpatient Encounter Medications as of 12/19/2022  Medication Sig   betamethasone dipropionate 0.05 % cream Apply 1 application topically 2 (two) times daily as needed (for psoriasis on body).   DULoxetine (CYMBALTA) 30 MG capsule TAKE 1 CAPSULE BY MOUTH EVERY DAY   Multiple Vitamins-Minerals (CENTRUM SILVER ADULT 50+ PO) Take 1 tablet by mouth daily.   naproxen sodium (ALEVE) 220 MG tablet Take 220 mg by mouth daily as needed.   triamcinolone (KENALOG) 0.025 % cream Apply 1 application. topically daily as needed (for areas on face. avoid eyes).   FLUZONE HIGH-DOSE QUADRIVALENT 0.7 ML SUSY    No facility-administered encounter medications on file as of  12/19/2022.    Allergies (verified) Patient has no known allergies.   History: Past Medical History:  Diagnosis Date   Arthritis    Cancer (HFranquez 2019   endometrial   Endometrial mass    History of chicken pox    History of kidney stones    History of measles    History of mumps    Osteoporosis    Personal history of chemotherapy    Personal history of radiation therapy    PMB (postmenopausal bleeding)    Psoriasis    Past Surgical History:  Procedure Laterality Date   APPENDECTOMY  1960   BREAST EXCISIONAL BIOPSY Left 1980   neg   BREAST SURGERY Left    biopsy   COLONOSCOPY WITH PROPOFOL N/A 03/13/2020   Procedure: COLONOSCOPY WITH PROPOFOL;  Surgeon: AJonathon Bellows MD;  [facility];  Service: Gastroenterology;  Laterality: N/A;   Injection varicose veins in legs Bilateral 2010   Dr. HHulda Humphrey  LITHOTRIPSY  1994, 2004   for renal stones   PORTA CATH INSERTION N/A 04/24/2018   Procedure: PORTA CATH INSERTION;  Surgeon: SKatha Cabal MD;  [facility];  Service: Cardiovascular;  Laterality: N/A;   PORTA CATH REMOVAL N/A 01/18/2021   Procedure: PORTA CATH REMOVAL;  Surgeon: DAlgernon Huxley MD;  [facility];  Service: Cardiovascular;  Laterality: N/A;   ROBOTIC ASSISTED TOTAL HYSTERECTOMY Bilateral 04/04/2018   Procedure: ROBOTIC ASSISTED  TOTAL HYSTERECTOMY,SENTINEL LYMPH NODE MAPPING AND BIOPSIES,AORTIC LYMPH NODE DISSECTION;  Surgeon: Gillis Ends, MD;  Location: [facility];  Service: Gynecology;  Laterality: Bilateral;   TUBAL LIGATION  1980   Family History  Problem Relation Age of Onset   Breast cancer Mother    Transient ischemic attack Mother    Arthritis Mother    Hypothyroidism Mother    Lung cancer Mother    Diabetes Father        type 2   Hypertension Father    Arthritis Father    Colon cancer Sister    Prostate cancer Brother    Hyperlipidemia Son    Stomach cancer Maternal Grandmother    Stroke  Maternal Grandfather    Social History   Socioeconomic History   Marital status: Divorced    Spouse name: single   Number of children: 1   Years of education: Not on file   Highest education level: 10th grade  Occupational History   Occupation: retired  Tobacco Use   Smoking status: Every Day    Packs/day: 0.25    Years: 30.00    Total pack years: 7.50    Types: Cigarettes   Smokeless tobacco: Never  Vaping Use   Vaping Use: Some days  Substance and Sexual Activity   Alcohol use: No    Alcohol/week: 0.0 standard drinks of alcohol   Drug use: No   Sexual activity: Not Currently  Other Topics Concern   Not on file  Social History Narrative   Not on file   Social Determinants of Health   Financial Resource Strain: Low Risk  (12/19/2022)   Overall Financial Resource Strain (CARDIA)    Difficulty of Paying Living Expenses: Not very hard  Food Insecurity: No Food Insecurity (12/19/2022)   Hunger Vital Sign    Worried About Running Out of Food in the Last Year: Never true    Ran Out of Food in the Last Year: Never true  Transportation Needs: No Transportation Needs (12/19/2022)   PRAPARE - Hydrologist (Medical): No    Lack of Transportation (Non-Medical): No  Physical Activity: Inactive (12/19/2022)   Exercise Vital Sign    Days of Exercise per Week: 0 days    Minutes of Exercise per Session: 0 min  Stress: Stress Concern Present (12/19/2022)   [place]    Feeling of Stress : To some extent  Social Connections: Moderately Integrated (12/19/2022)   Social Connection and Isolation Panel [NHANES]    Frequency of Communication with Friends and Family: More than three times a week    Frequency of Social Gatherings with Friends and Family: More than three times a week    Attends Religious Services: 1 to 4 times per year    Active Member of [employer] or Organizations: Yes    Attends English as a second language teacher Meetings: Never    Marital Status: Divorced    Tobacco Counseling Ready to quit: Not Answered Counseling given: Not Answered   Clinical Intake:  Pre-visit preparation completed: Yes  Pain : No/denies pain     BMI - recorded: 26.3 Nutritional Status: BMI 25 -29 Overweight Nutritional Risks: None Diabetes: No  How often do you need to have someone help you when you read instructions, pamphlets, or other written materials from your doctor or pharmacy?: 1 - Never  Diabetic?no  Interpreter Needed?: No  Information entered by :: B.Karely Hurtado,LPN   Activities of Daily  Living    12/19/2022   10:31 AM 10/05/2022   11:16 AM  In your present state of health, do you have any difficulty performing the following activities:  Hearing? 0 0  Vision? 1 0  Difficulty concentrating or making decisions? 0 0  Walking or climbing stairs? 0 0  Dressing or bathing? 0 0  Doing errands, shopping? 0 0  Preparing Food and eating ? N   Using the Toilet? N   In the past six months, have you accidently leaked urine? N   Do you have problems with loss of bowel control? N   Managing your Medications? N   Managing your Finances? N   Housekeeping or managing your Housekeeping? N     Patient Care Team: Mikey Kirschner, PA-C as PCP - General (Physician Assistant) Mellody Drown, MD as Referring Physician (Obstetrics and Gynecology) Clent Jacks, RN as Oncology Nurse Navigator Noreene Filbert, MD as Referring Physician (Radiation Oncology) Earlie Server, MD as Consulting Physician (Oncology) [place], [place] (Optometry)  Indicate any recent Medical Services you may have received from other than [facility] providers in the past year (date may be approximate).     Assessment:   This is a routine wellness examination for Casey.  Hearing/Vision screen Hearing Screening - Comments:: Adequate hearing Vision Screening - Comments:: Needs to make appt as she experiencing floaters in  rt eye and does not see as well in that eye. [facility]  Dietary issues and exercise activities discussed: Current Exercise Habits: The patient does not participate in regular exercise at present, Exercise limited by: None identified   Goals Addressed             This Visit's Progress    DIET - INCREASE WATER INTAKE   Not on track    Recommend to drink at least 6-8 8oz glasses of water per day.       Depression Screen    12/19/2022   10:27 AM 10/05/2022   11:16 AM 03/24/2022   11:12 AM 12/15/2021   11:14 AM 03/02/2021   10:42 AM 12/09/2020   10:31 AM 02/25/2020   10:58 AM  PHQ 2/9 Scores  PHQ - 2 Score 0 0 0 0 0 0 0  PHQ- 9 Score   0  0      Fall Risk    12/19/2022   10:39 AM 10/05/2022   11:16 AM 03/24/2022   11:12 AM 12/15/2021   11:16 AM 03/02/2021   10:42 AM  Fall Risk   Falls in the past year? 0 0 0 0 0  Number falls in past yr: 0  0 0 0  Injury with Fall? 0 0 0 0 0  Risk for fall due to : No Fall Risks  No Fall Risks No Fall Risks No Fall Risks  Follow up Education provided;Falls prevention discussed   Falls evaluation completed Falls evaluation completed    FALL RISK PREVENTION PERTAINING TO THE HOME:  Any stairs in or around the home? Yes  If so, are there any without handrails? Yes  Home free of loose throw rugs in walkways, pet beds, electrical cords, etc? Yes  Adequate lighting in your home to reduce risk of falls? Yes   ASSISTIVE DEVICES UTILIZED TO PREVENT FALLS:  Life alert? No  Use of a cane, walker or w/c? No  Grab bars in the bathroom? Yes  Shower chair or bench in shower? No  Elevated toilet seat or a handicapped toilet? No  Cognitive Function:        12/19/2022   10:34 AM 12/09/2020   10:34 AM  6CIT Screen  What Year? 0 points 0 points  What month? 0 points 0 points  What time? 0 points 0 points  Count back from 20 0 points 0 points  Months in reverse 0 points 0 points  Repeat phrase 0 points 0 points  Total Score 0 points 0 points     Immunizations Immunization History  Administered Date(s) Administered   Influenza, High Dose Seasonal PF 08/03/2019, 08/07/2020   Influenza-Unspecified 08/16/2016   Moderna Sars-Covid-2 Vaccination 07/20/2020, 08/17/2020, 02/10/2021   Pneumococcal Conjugate-13 01/28/2019   Pneumococcal Polysaccharide-23 10/08/2009, 12/09/2020   Td 01/28/2019   Tdap 10/08/2009    TDAP status: Up to date  Flu Vaccine status: Due, Education has been provided regarding the importance of this vaccine. Advised may receive this vaccine at local pharmacy or Health Dept. Aware to provide a copy of the vaccination record if obtained from local pharmacy or Health Dept. Verbalized acceptance and understanding.  Pneumococcal vaccine status: Up to date  Covid-19 vaccine status: Completed vaccines  Qualifies for Shingles Vaccine? Yes   Zostavax completed No   Shingrix Completed?: No.    Education has been provided regarding the importance of this vaccine. Patient has been advised to call insurance company to determine out of pocket expense if they have not yet received this vaccine. Advised may also receive vaccine at local pharmacy or Health Dept. Verbalized acceptance and understanding.  Screening Tests Health Maintenance  Topic Date Due   Zoster Vaccines- Shingrix (1 of 2) Never done   COVID-19 Vaccine (4 - 2023-24 season) 07/08/2022   INFLUENZA VACCINE  02/05/2023 (Originally 06/07/2022)   MAMMOGRAM  05/04/2023   Medicare Annual Wellness (AWV)  12/20/2023   DEXA SCAN  02/02/2024   COLONOSCOPY (Pts 45-76yr Insurance coverage will need to be confirmed)  03/13/2025   DTaP/Tdap/Td (3 - Td or Tdap) 01/27/2029   Pneumonia Vaccine 70 Years old  Completed   Hepatitis C Screening  Completed   HPV VACCINES  Aged Out    Health Maintenance  Health Maintenance Due  Topic Date Due   Zoster Vaccines- Shingrix (1 of 2) Never done   COVID-19 Vaccine (4 - 2023-24 season) 07/08/2022    Colorectal cancer  screening: Type of screening: Colonoscopy. Completed yes. Repeat every 5 years  Mammogram status: Ordered yes. Pt provided with contact info and advised to call to schedule appt.  Due after 05/03/2022  Bone Density status: Completed yes. Results reflect: Bone density results: NORMAL. Repeat every 5 years.  Lung Cancer Screening: (Low Dose CT Chest recommended if Age 70-80years, 30 pack-year currently smoking OR have quit w/in 15years.) does qualify.   Lung Cancer Screening Referral: no pt ca dr does scans  Additional Screening:  Hepatitis C Screening: does not qualify; Completed no  Vision Screening: Recommended annual ophthalmology exams for early detection of glaucoma and other disorders of the eye. Is the patient up to date with their annual eye exam?  No  Who is the provider or what is the name of the office in which the patient attends annual eye exams? [place] not established with a provider, would they like to be referred to a provider to establish care? No .   Dental Screening: Recommended annual dental exams for proper oral hygiene  Community Resource Referral / Chronic Care Management: CRR required this visit?  No   CCM required  this visit?  No      Plan:     I have personally reviewed and noted the following in the patient's chart:   Medical and social history Use of alcohol, tobacco or illicit drugs  Current medications and supplements including opioid prescriptions. Patient is not currently taking opioid prescriptions. Functional ability and status Nutritional status Physical activity Advanced directives List of other physicians Hospitalizations, surgeries, and ER visits in previous 12 months Vitals Screenings to include cognitive, depression, and falls Referrals and appointments  In addition, I have reviewed and discussed with patient certain preventive protocols, quality metrics, and best practice recommendations. A written personalized care  plan for preventive services as well as general preventive health recommendations were provided to patient.     Roger Shelter, LPN   [id number]   Nurse Notes: pt doing well with no concerns and/or questions.MMG ordered for July scheduling.

## 2022-12-28 ENCOUNTER — Other Ambulatory Visit: Payer: Self-pay | Admitting: Oncology

## 2023-01-31 ENCOUNTER — Encounter: Payer: Self-pay | Admitting: Oncology

## 2023-03-02 ENCOUNTER — Inpatient Hospital Stay (HOSPITAL_BASED_OUTPATIENT_CLINIC_OR_DEPARTMENT_OTHER): Payer: 59 | Admitting: Oncology

## 2023-03-02 ENCOUNTER — Inpatient Hospital Stay: Payer: 59 | Attending: Oncology

## 2023-03-02 ENCOUNTER — Encounter: Payer: Self-pay | Admitting: Oncology

## 2023-03-02 VITALS — BP 124/75 | HR 59 | Temp 96.8°F | Resp 18 | Ht 68.0 in | Wt 168.0 lb

## 2023-03-02 DIAGNOSIS — Z79899 Other long term (current) drug therapy: Secondary | ICD-10-CM | POA: Diagnosis not present

## 2023-03-02 DIAGNOSIS — G620 Drug-induced polyneuropathy: Secondary | ICD-10-CM | POA: Insufficient documentation

## 2023-03-02 DIAGNOSIS — C541 Malignant neoplasm of endometrium: Secondary | ICD-10-CM | POA: Diagnosis present

## 2023-03-02 DIAGNOSIS — F17210 Nicotine dependence, cigarettes, uncomplicated: Secondary | ICD-10-CM | POA: Diagnosis not present

## 2023-03-02 DIAGNOSIS — C55 Malignant neoplasm of uterus, part unspecified: Secondary | ICD-10-CM

## 2023-03-02 DIAGNOSIS — Z801 Family history of malignant neoplasm of trachea, bronchus and lung: Secondary | ICD-10-CM | POA: Insufficient documentation

## 2023-03-02 DIAGNOSIS — T451X5A Adverse effect of antineoplastic and immunosuppressive drugs, initial encounter: Secondary | ICD-10-CM | POA: Insufficient documentation

## 2023-03-02 DIAGNOSIS — Z08 Encounter for follow-up examination after completed treatment for malignant neoplasm: Secondary | ICD-10-CM

## 2023-03-02 DIAGNOSIS — Z800 Family history of malignant neoplasm of digestive organs: Secondary | ICD-10-CM | POA: Insufficient documentation

## 2023-03-02 DIAGNOSIS — Z803 Family history of malignant neoplasm of breast: Secondary | ICD-10-CM | POA: Insufficient documentation

## 2023-03-02 LAB — COMPREHENSIVE METABOLIC PANEL
ALT: 12 U/L (ref 0–44)
AST: 17 U/L (ref 15–41)
Albumin: 3.8 g/dL (ref 3.5–5.0)
Alkaline Phosphatase: 55 U/L (ref 38–126)
Anion gap: 7 (ref 5–15)
BUN: 20 mg/dL (ref 8–23)
CO2: 25 mmol/L (ref 22–32)
Calcium: 8.9 mg/dL (ref 8.9–10.3)
Chloride: 105 mmol/L (ref 98–111)
Creatinine, Ser: 0.83 mg/dL (ref 0.44–1.00)
GFR, Estimated: >60 mL/min (ref >60)
Glucose, Bld: 89 mg/dL (ref 70–99)
Potassium: 3.9 mmol/L (ref 3.5–5.1)
Sodium: 137 mmol/L (ref 135–145)
Total Bilirubin: 0.7 mg/dL (ref 0.3–1.2)
Total Protein: 6.7 g/dL (ref 6.5–8.1)

## 2023-03-02 LAB — CBC WITH DIFFERENTIAL/PLATELET
Abs Immature Granulocytes: 0.03 10*3/uL (ref 0.00–0.07)
Basophils Absolute: 0.1 10*3/uL (ref 0.0–0.1)
Basophils Relative: 1 %
Eosinophils Absolute: 0.3 10*3/uL (ref 0.0–0.5)
Eosinophils Relative: 4 %
HCT: 40.6 % (ref 36.0–46.0)
Hemoglobin: 13.3 g/dL (ref 12.0–15.0)
Immature Granulocytes: 0 %
Lymphocytes Relative: 37 %
Lymphs Abs: 3.2 10*3/uL (ref 0.7–4.0)
MCH: 30 pg (ref 26.0–34.0)
MCHC: 32.8 g/dL (ref 30.0–36.0)
MCV: 91.4 fL (ref 80.0–100.0)
Monocytes Absolute: 0.7 10*3/uL (ref 0.1–1.0)
Monocytes Relative: 8 %
Neutro Abs: 4.3 10*3/uL (ref 1.7–7.7)
Neutrophils Relative %: 50 %
Platelets: 222 10*3/uL (ref 150–400)
RBC: 4.44 MIL/uL (ref 3.87–5.11)
RDW: 12.9 % (ref 11.5–15.5)
WBC: 8.6 10*3/uL (ref 4.0–10.5)
nRBC: 0 % (ref 0.0–0.2)

## 2023-03-02 MED ORDER — DULOXETINE HCL 30 MG PO CPEP: 30.0000 mg | ORAL_CAPSULE | Freq: Every day | ORAL | 1 refills | Status: DC

## 2023-03-02 NOTE — Progress Notes (Signed)
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 Clinically patient is doing very well Labs reviewed and discussed with patient. CA125 has been monitored and has been stable.  Today's levels are pending Continue surveillance.  Patient will see gynecology oncology in 6 months and see me in 12 months  Chemotherapy-induced peripheral neuropathy Stable symptoms continue Cymbalta 30 mg daily.   Refills were sent to pharmacy.  Orders Placed This Encounter  Procedures   CA 125    Standing Status:   Future    Standing Expiration Date:   03/01/2024   CMP (Cancer Center only)    Standing Status:   Future    Standing Expiration Date:   03/01/2024   CBC with Differential (Cancer Center Only)    Standing Status:   Future    Standing Expiration Date:   03/01/2024   Follow-up in 1 year. All questions were answered. The patient knows to call the clinic with any problems, questions or concerns.  Rickard Patience, MD, PhD [facility] Hematology Oncology 03/02/2023   HISTORY OF PRESENTING ILLNESS:  Katherine Snyder is a  72 y.o.  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 Pathology showed  A uterus with cervix hysterectomy -Carcinosarcoma B sentinel lymph node, right primary obturator negative C lymph node right external iliac lymph negative D fallopian tube and ovary, unilateral right negative E sentinel lymph node right low periaortic: Negative F sentinel lymph node left low periaortic negative  CANCER CASE SUMMARY: ENDOMETRIUM  Procedure: Total  hysterectomy and bilateral salpingo-oophorectomy  Histologic Type: Carcinosarcoma  Histologic Grade: Not applicable  Myometrial Invasion: Present       Depth of myometrial invasion cannot be determined due to exophytic  tumor growth and effacement of endometrial/myometrial junction       Myometrial thickness: At least 8 mm       Percentage depth of myometrial invasion: Estimated less than 50%  Uterine Serosa Involvement: Not identified  Cervical Stromal Involvement: Not identified  Other Tissue/Organ Involvement: Not identified  Peritoneal/ Ascitic Fluid: Negative for malignancy  Lymphovascular Invasion: Present  Regional Lymph Nodes: All lymph nodes negative for tumor cells  Number of Lymph Nodes Examined: 5       Total number of pelvic nodes examined: 2       Number of pelvic sentinel nodes examined: 1       Total number of para-aortic nodes examined: 3       Number of para-aortic sentinel nodes examined: 3   03/01/2018 Pathologic Stage Classification (pTNM, AJCC 8th Edition) (Note J): pT1a  pN0/FIGO IA   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   Pertinent oncology history Katherine Snyder is a 73 y.o. female who has above history reviewed by me today presents for assessment prior to Cycle 6 adjuvant chemotherapy management for stage Ia endometrial carcinosarcoma. During cycle 1 treatment, reported feeling burning down and having really bad  heartburn.  Taxol was temporarily stopped.  The patient was given Benadryl, Solu-Medrol, Pepcid, symptoms improved and resolved.  Taxol was restarted and patient is able to finish her treatment. Patient reports feeling extremely tired for 2 days after chemotherapy.  She was seen by nurse practitioner Boneta Lucks during interval.   She  complained burning sensation with urination and had UA and urine culture obtained.  UA was not convincing for UTI urine culture showed less than 10,000 colonies of insignificant growth.  Patient remains afebrile. Patient also reports her chronic fasciitis of foot has got a lot worse since the start of chemotherapy.  Possible neuropathy was discussed and the patient is reluctant to start gabapentin or Cymbalta.  She was given tramadol 100 mg twice daily.  Patient reports symptoms gradually improved.    Patient was found to have neutropenia so she received Neupogen x 2. She reports bone pain started after Neupogen shots.   Marland Kitchen#Lung nodules # 12/10/2018- CT Chest wo contrast - New sub solid density measuring 12 mm with associated 3 mm solid nodule is noted in left upper lobe.Stable sub solid density is noted in right upper lobe with grossly stable 4 mm solid nodule. Multiple other pulmonary nodules are noted in both lungs which are stable compared to prior exam Case was discussed on tumor board and recommend surveillance CT in 3 months.   new lung nodules have resolved on 10/14/2019  All other nodules are stable.   # #Left thyroid nodule,    S/p thyroid nodule FNA.- pathology is benign.   INTERVAL HISTORY Katherine Snyder is a 72 y.o. female who has above history reviewed by me presents for assessment prior to Cycle 6 adjuvant chemotherapy for stage Ia endometrial carcinosarcoma.   Patient reports feeling well She has no new complaints. Chronic neuropathy and since, she is on Cymbalta 30 mg daily and request a refill.  Symptoms are stable.   No new complaints.     Review of Systems  Constitutional:  Negative for chills, fever, malaise/fatigue and weight loss.  HENT:  Negative for sore throat.   Eyes:  Negative for redness.  Respiratory:  Negative for cough, shortness of breath and wheezing.   Cardiovascular:  Negative for chest pain, palpitations and leg swelling.  Gastrointestinal:  Negative for  abdominal pain, blood in stool, nausea and vomiting.  Genitourinary:  Negative for dysuria.  Musculoskeletal:  Negative for myalgias.  Skin:  Negative for rash.  Neurological:  Negative for dizziness, tingling and tremors.  Endo/Heme/Allergies:  Does not bruise/bleed easily.  Psychiatric/Behavioral:  Negative for hallucinations.     MEDICAL HISTORY:  Past Medical History:  Diagnosis Date   Arthritis    Cancer (HCC) 2019   endometrial   Endometrial mass    History of chicken pox    History of kidney stones    History of measles    History of mumps    Osteoporosis    Personal history of chemotherapy    Personal history of radiation therapy    PMB (postmenopausal bleeding)    Psoriasis     SURGICAL HISTORY: Past Surgical History:  Procedure Laterality Date   APPENDECTOMY  1960   BREAST EXCISIONAL BIOPSY Left 1980   neg   BREAST SURGERY Left    biopsy   COLONOSCOPY WITH PROPOFOL N/A 03/13/2020   Procedure: COLONOSCOPY WITH PROPOFOL;  Surgeon: Wyline Mood, MD;  Location: [facility] ENDOSCOPY;  Service: Gastroenterology;  Laterality: N/A;   Injection varicose veins in legs Bilateral 2010  Dr. Earnestine Leys   LITHOTRIPSY  1994, 2004   for renal stones   PORTA CATH INSERTION N/A 04/24/2018   Procedure: PORTA CATH INSERTION;  Surgeon: Renford Dills, MD;  Location: [facility] INVASIVE CV LAB;  Service: Cardiovascular;  Laterality: N/A;   PORTA CATH REMOVAL N/A 01/18/2021   Procedure: PORTA CATH REMOVAL;  Surgeon: Annice Needy, MD;  Location: [facility] INVASIVE CV LAB;  Service: Cardiovascular;  Laterality: N/A;   ROBOTIC ASSISTED TOTAL HYSTERECTOMY Bilateral 04/04/2018   Procedure: ROBOTIC ASSISTED TOTAL HYSTERECTOMY,SENTINEL LYMPH NODE MAPPING AND BIOPSIES,AORTIC LYMPH NODE DISSECTION;  Surgeon: Artelia Laroche, MD;  Location: [facility] ORS;  Service: Gynecology;  Laterality: Bilateral;   TUBAL LIGATION  1980    SOCIAL HISTORY: Social History   Socioeconomic History   Marital status: Divorced     Spouse name: single   Number of children: 1   Years of education: Not on file   Highest education level: 10th grade  Occupational History   Occupation: retired  Tobacco Use   Smoking status: Every Day    Packs/day: 0.50    Years: 50.00    Additional pack years: 0.00    Total pack years: 25.00    Types: Cigarettes   Smokeless tobacco: Never  Vaping Use   Vaping Use: Some days  Substance and Sexual Activity   Alcohol use: No    Alcohol/week: 0.0 standard drinks of alcohol   Drug use: No   Sexual activity: Not Currently  Other Topics Concern   Not on file  Social History Narrative   Not on file   Social Determinants of Health   Financial Resource Strain: Low Risk  (12/19/2022)   Overall Financial Resource Strain (CARDIA)    Difficulty of Paying Living Expenses: Not very hard  Food Insecurity: No Food Insecurity (12/19/2022)   Hunger Vital Sign    Worried About Running Out of Food in the Last Year: Never true    Ran Out of Food in the Last Year: Never true  Transportation Needs: No Transportation Needs (12/19/2022)   PRAPARE - Administrator, Civil Service (Medical): No    Lack of Transportation (Non-Medical): No  Physical Activity: Inactive (12/19/2022)   Exercise Vital Sign    Days of Exercise per Week: 0 days    Minutes of Exercise per Session: 0 min  Stress: Stress Concern Present (12/19/2022)   [employer] of Occupational Health - Occupational Stress Questionnaire    Feeling of Stress : To some extent  Social Connections: Moderately Integrated (12/19/2022)   Social Connection and Isolation Panel [NHANES]    Frequency of Communication with Friends and Family: More than three times a week    Frequency of Social Gatherings with Friends and Family: More than three times a week    Attends Religious Services: 1 to 4 times per year    Active Member of [employer] or Organizations: Yes    Attends Banker Meetings: Never    Marital Status: Divorced   Catering manager Violence: Not At Risk (12/19/2022)   Humiliation, Afraid, Rape, and Kick questionnaire    Fear of Current or Ex-Partner: No    Emotionally Abused: No    Physically Abused: No    Sexually Abused: No    FAMILY HISTORY: Family History  Problem Relation Age of Onset   Breast cancer Mother    Transient ischemic attack Mother    Arthritis Mother    Hypothyroidism Mother    Lung cancer Mother  Diabetes Father        type 2   Hypertension Father    Arthritis Father    Colon cancer Sister    Prostate cancer Brother    Hyperlipidemia Son    Stomach cancer Maternal Grandmother    Stroke Maternal Grandfather     ALLERGIES:  has No Known Allergies.  MEDICATIONS:  Current Outpatient Medications  Medication Sig Dispense Refill   betamethasone dipropionate 0.05 % cream Apply 1 application topically 2 (two) times daily as needed (for psoriasis on body). 30 g 1   FLUZONE HIGH-DOSE QUADRIVALENT 0.7 ML SUSY      Multiple Vitamins-Minerals (CENTRUM SILVER ADULT 50+ PO) Take 1 tablet by mouth daily.     naproxen sodium (ALEVE) 220 MG tablet Take 220 mg by mouth daily as needed.     triamcinolone (KENALOG) 0.025 % cream Apply 1 application. topically daily as needed (for areas on face. avoid eyes). 80 g 1   DULoxetine (CYMBALTA) 30 MG capsule Take 1 capsule (30 mg total) by mouth daily. 90 capsule 1   No current facility-administered medications for this visit.     PHYSICAL EXAMINATION: ECOG PERFORMANCE STATUS: 1 - Symptomatic but completely ambulatory Vitals:   03/02/23 1351  BP: 124/75  Pulse: (!) 59  Resp: 18  Temp: (!) 96.8 F (36 C)  SpO2: 97%   Filed Weights   03/02/23 1351  Weight: 168 lb (76.2 kg)    Physical Exam Constitutional:      General: She is not in acute distress. HENT:     Head: Normocephalic and atraumatic.     Right Ear: External ear normal.  Eyes:     General: No scleral icterus.    Conjunctiva/sclera: Conjunctivae normal.      Pupils: Pupils are equal, round, and reactive to light.  Cardiovascular:     Rate and Rhythm: Normal rate and regular rhythm.     Heart sounds: Normal heart sounds.  Pulmonary:     Effort: Pulmonary effort is normal. No respiratory distress.     Breath sounds: Normal breath sounds. No wheezing or rales.  Chest:     Chest wall: No tenderness.  Abdominal:     General: Bowel sounds are normal. There is no distension.     Palpations: Abdomen is soft. There is no mass.     Tenderness: There is no abdominal tenderness.  Musculoskeletal:        General: No deformity. Normal range of motion.     Cervical back: Normal range of motion and neck supple.  Lymphadenopathy:     Cervical: No cervical adenopathy.  Skin:    General: Skin is warm and dry.     Findings: No erythema or rash.  Neurological:     Mental Status: She is alert and oriented to person, place, and time. Mental status is at baseline.     Cranial Nerves: No cranial nerve deficit.     Coordination: Coordination normal.     Deep Tendon Reflexes: Reflexes normal.  Psychiatric:        Mood and Affect: Mood normal.      LABORATORY DATA:  I have reviewed the data as listed    Latest Ref Rng & Units 03/02/2023    1:35 PM 03/21/2022    9:56 AM 09/20/2021    9:39 AM  CBC  WBC 4.0 - 10.5 K/uL 8.6  6.1  7.1   Hemoglobin 12.0 - 15.0 g/dL 16.1  09.6  04.5   Hematocrit  36.0 - 46.0 % 40.6  38.9  41.0   Platelets 150 - 400 K/uL 222  235  229       Latest Ref Rng & Units 03/02/2023    1:35 PM 03/21/2022    9:56 AM 09/20/2021    9:39 AM  CMP  Glucose 70 - 99 mg/dL 89  97  93   BUN 8 - 23 mg/dL 20  18  17    Creatinine 0.44 - 1.00 mg/dL 1.47  8.29  5.62   Sodium 135 - 145 mmol/L 137  138  139   Potassium 3.5 - 5.1 mmol/L 3.9  4.5  4.2   Chloride 98 - 111 mmol/L 105  106  101   CO2 22 - 32 mmol/L 25  27  28    Calcium 8.9 - 10.3 mg/dL 8.9  8.8  9.1   Total Protein 6.5 - 8.1 g/dL 6.7  6.8  7.3   Total Bilirubin 0.3 - 1.2 mg/dL 0.7   0.6  0.6   Alkaline Phos 38 - 126 U/L 55  60  61   AST 15 - 41 U/L 17  19  19    ALT 0 - 44 U/L 12  13  13        RADIOGRAPHIC STUDIES: I have personally reviewed the radiological images as listed and agreed with the findings in the report. No results found.

## 2023-03-02 NOTE — Progress Notes (Signed)
Patient states that she does not have any concerns for the provider today.

## 2023-03-02 NOTE — Assessment & Plan Note (Signed)
Stable symptoms continue Cymbalta 30 mg daily.   Refills were sent to pharmacy.

## 2023-03-02 NOTE — Assessment & Plan Note (Signed)
Stage IA Uterine carcinosarcoma  Status post 6 cycles adjuvant carboplatinum AUC 5 and Taxol 135 mg/m. Clinically patient is doing very well Labs reviewed and discussed with patient. CA125 has been monitored and has been stable.  Today's levels are pending Continue surveillance.  Patient will see gynecology oncology in 6 months and see me in 12 months

## 2023-03-03 LAB — CA 125: Cancer Antigen (CA) 125: 7.3 U/mL (ref 0.0–38.1)

## 2023-03-25 ENCOUNTER — Encounter: Payer: Self-pay | Admitting: Oncology

## 2023-04-05 ENCOUNTER — Encounter: Payer: Self-pay | Admitting: Physician Assistant

## 2023-04-05 ENCOUNTER — Ambulatory Visit (INDEPENDENT_AMBULATORY_CARE_PROVIDER_SITE_OTHER): Payer: 59 | Admitting: Physician Assistant

## 2023-04-05 VITALS — BP 134/70 | HR 66 | Ht 68.0 in | Wt 169.3 lb

## 2023-04-05 DIAGNOSIS — Z0000 Encounter for general adult medical examination without abnormal findings: Secondary | ICD-10-CM | POA: Diagnosis not present

## 2023-04-05 DIAGNOSIS — Z1231 Encounter for screening mammogram for malignant neoplasm of breast: Secondary | ICD-10-CM

## 2023-04-05 DIAGNOSIS — H6123 Impacted cerumen, bilateral: Secondary | ICD-10-CM

## 2023-04-05 DIAGNOSIS — J302 Other seasonal allergic rhinitis: Secondary | ICD-10-CM | POA: Diagnosis not present

## 2023-04-05 MED ORDER — LORATADINE 10 MG PO TABS
10.0000 mg | ORAL_TABLET | Freq: Every day | ORAL | 11 refills | Status: AC
Start: 2023-04-05 — End: ?

## 2023-04-05 NOTE — Progress Notes (Signed)
I,Sha'taria Tyson,acting as a Neurosurgeon for [employer], PA-C.,have documented all relevant documentation on the behalf of Alfredia Ferguson, PA-C,as directed by  Alfredia Ferguson, PA-C while in the presence of Alfredia Ferguson, PA-C.   Complete physical exam   Patient: Katherine Snyder   DOB: [birth year]   72 y.o. Female  MRN: [id number] Visit Date: 04/05/2023  Today's healthcare provider: Alfredia Ferguson, PA-C   Cc. cpe  Subjective    Katherine Snyder is a 72 y.o. female who presents today for a complete physical exam.  She reports consuming a general diet.  The patient reports going walking sometimes for at least 30 minutes.   She generally feels well. She reports sleeping well. She does not have additional problems to discuss today.  HPI  She reports feeling her ears are clogged w/ wax  Past Medical History:  Diagnosis Date   Arthritis    Cancer (HCC) 2019   endometrial   Endometrial mass    History of chicken pox    History of kidney stones    History of measles    History of mumps    Osteoporosis    Personal history of chemotherapy    Personal history of radiation therapy    PMB (postmenopausal bleeding)    Psoriasis    Past Surgical History:  Procedure Laterality Date   APPENDECTOMY  1960   BREAST EXCISIONAL BIOPSY Left 1980   neg   BREAST SURGERY Left    biopsy   COLONOSCOPY WITH PROPOFOL N/A 03/13/2020   Procedure: COLONOSCOPY WITH PROPOFOL;  Surgeon: Wyline Mood, MD;  Location: [facility] ENDOSCOPY;  Service: Gastroenterology;  Laterality: N/A;   Injection varicose veins in legs Bilateral 2010   Dr. Earnestine Leys   LITHOTRIPSY  1994, 2004   for renal stones   PORTA CATH INSERTION N/A 04/24/2018   Procedure: PORTA CATH INSERTION;  Surgeon: Renford Dills, MD;  Location: [facility] INVASIVE CV LAB;  Service: Cardiovascular;  Laterality: N/A;   PORTA CATH REMOVAL N/A 01/18/2021   Procedure: PORTA CATH REMOVAL;  Surgeon: Annice Needy, MD;  Location: [facility] INVASIVE CV LAB;  Service:  Cardiovascular;  Laterality: N/A;   ROBOTIC ASSISTED TOTAL HYSTERECTOMY Bilateral 04/04/2018   Procedure: ROBOTIC ASSISTED TOTAL HYSTERECTOMY,SENTINEL LYMPH NODE MAPPING AND BIOPSIES,AORTIC LYMPH NODE DISSECTION;  Surgeon: Artelia Laroche, MD;  Location: [facility] ORS;  Service: Gynecology;  Laterality: Bilateral;   TUBAL LIGATION  1980   Social History   Socioeconomic History   Marital status: Divorced    Spouse name: single   Number of children: 1   Years of education: Not on file   Highest education level: 10th grade  Occupational History   Occupation: retired  Tobacco Use   Smoking status: Every Day    Packs/day: 0.50    Years: 50.00    Additional pack years: 0.00    Total pack years: 25.00    Types: Cigarettes   Smokeless tobacco: Never  Vaping Use   Vaping Use: Some days  Substance and Sexual Activity   Alcohol use: No    Alcohol/week: 0.0 standard drinks of alcohol   Drug use: No   Sexual activity: Not Currently  Other Topics Concern   Not on file  Social History Narrative   Not on file   Social Determinants of Health   Financial Resource Strain: Low Risk  (12/19/2022)   Overall Financial Resource Strain (CARDIA)    Difficulty of Paying Living Expenses: Not very hard  Food Insecurity: No  Food Insecurity (12/19/2022)   Hunger Vital Sign    Worried About Running Out of Food in the Last Year: Never true    Ran Out of Food in the Last Year: Never true  Transportation Needs: No Transportation Needs (12/19/2022)   PRAPARE - Administrator, Civil Service (Medical): No    Lack of Transportation (Non-Medical): No  Physical Activity: Inactive (12/19/2022)   Exercise Vital Sign    Days of Exercise per Week: 0 days    Minutes of Exercise per Session: 0 min  Stress: Stress Concern Present (12/19/2022)   [employer] of Occupational Health - Occupational Stress Questionnaire    Feeling of Stress : To some extent  Social Connections: Moderately Integrated  (12/19/2022)   Social Connection and Isolation Panel [NHANES]    Frequency of Communication with Friends and Family: More than three times a week    Frequency of Social Gatherings with Friends and Family: More than three times a week    Attends Religious Services: 1 to 4 times per year    Active Member of [employer] or Organizations: Yes    Attends Banker Meetings: Never    Marital Status: Divorced  Catering manager Violence: Not At Risk (12/19/2022)   Humiliation, Afraid, Rape, and Kick questionnaire    Fear of Current or Ex-Partner: No    Emotionally Abused: No    Physically Abused: No    Sexually Abused: No   Family Status  Relation Name Status   Mother  Deceased at age 60       died from Metastatic cancer. Gallblader cancer   Father  Alive   Sister  Alive   Brother  Alive   Son  Alive   MGM  Deceased   MGF  Deceased   Family History  Problem Relation Age of Onset   Breast cancer Mother    Transient ischemic attack Mother    Arthritis Mother    Hypothyroidism Mother    Lung cancer Mother    Diabetes Father        type 2   Hypertension Father    Arthritis Father    Colon cancer Sister    Prostate cancer Brother    Hyperlipidemia Son    Stomach cancer Maternal Grandmother    Stroke Maternal Grandfather    No Known Allergies  Patient Care Team: Alfredia Ferguson, PA-C as PCP - General (Physician Assistant) Leida Lauth, MD as Referring Physician (Obstetrics and Gynecology) Benita Gutter, RN as Oncology Nurse Navigator Carmina Miller, MD as Referring Physician (Radiation Oncology) Rickard Patience, MD as Consulting Physician (Oncology) Pa, [facility] (Optometry)   Medications: Outpatient Medications Prior to Visit  Medication Sig   betamethasone dipropionate 0.05 % cream Apply 1 application topically 2 (two) times daily as needed (for psoriasis on body).   DULoxetine (CYMBALTA) 30 MG capsule Take 1 capsule (30 mg total) by mouth daily.   Multiple  Vitamins-Minerals (CENTRUM SILVER ADULT 50+ PO) Take 1 tablet by mouth daily.   naproxen sodium (ALEVE) 220 MG tablet Take 220 mg by mouth daily as needed.   triamcinolone (KENALOG) 0.025 % cream Apply 1 application. topically daily as needed (for areas on face. avoid eyes).   FLUZONE HIGH-DOSE QUADRIVALENT 0.7 ML SUSY    No facility-administered medications prior to visit.    Review of Systems  Constitutional:  Negative for fatigue and fever.  Respiratory:  Negative for cough and shortness of breath.   Cardiovascular:  Negative  for chest pain and leg swelling.  Gastrointestinal:  Negative for abdominal pain.  Neurological:  Negative for dizziness and headaches.     Objective    BP 134/70 (BP Location: Left Arm, Patient Position: Sitting, Cuff Size: Normal)   Pulse 66   Ht 5\' 8"  (1.727 m)   Wt 169 lb 4.8 oz (76.8 kg)   SpO2 97%   BMI 25.74 kg/m    Physical Exam Constitutional:      General: She is awake.     Appearance: She is well-developed. She is not ill-appearing.  HENT:     Head: Normocephalic.     Right Ear: Tympanic membrane normal.     Left Ear: Tympanic membrane normal.     Ears:     Comments: Before flushing b/l cerumen impaction, R worse than L    Nose: Nose normal. No congestion or rhinorrhea.     Mouth/Throat:     Pharynx: No oropharyngeal exudate or posterior oropharyngeal erythema.  Eyes:     Conjunctiva/sclera: Conjunctivae normal.     Pupils: Pupils are equal, round, and reactive to light.  Neck:     Thyroid: No thyroid mass or thyromegaly.  Cardiovascular:     Rate and Rhythm: Normal rate and regular rhythm.     Heart sounds: Normal heart sounds.  Pulmonary:     Effort: Pulmonary effort is normal.     Breath sounds: Normal breath sounds.  Abdominal:     Palpations: Abdomen is soft.     Tenderness: There is no abdominal tenderness.  Musculoskeletal:     Right lower leg: No swelling. No edema.     Left lower leg: No swelling. No edema.   Lymphadenopathy:     Cervical: No cervical adenopathy.  Skin:    General: Skin is warm.  Neurological:     Mental Status: She is alert and oriented to person, place, and time.  Psychiatric:        Attention and Perception: Attention normal.        Mood and Affect: Mood normal.        Speech: Speech normal.        Behavior: Behavior normal. Behavior is cooperative.      Last depression screening scores    04/05/2023   10:42 AM 12/19/2022   10:27 AM 10/05/2022   11:16 AM  PHQ 2/9 Scores  PHQ - 2 Score 0 0 0  PHQ- 9 Score 0     Last fall risk screening    04/05/2023   10:42 AM  Fall Risk   Falls in the past year? 0  Number falls in past yr: 0  Injury with Fall? 0  Risk for fall due to : No Fall Risks  Follow up Falls evaluation completed   Last Audit-C alcohol use screening    04/05/2023   10:42 AM  Alcohol Use Disorder Test (AUDIT)  1. How often do you have a drink containing alcohol? 0  3. How often do you have six or more drinks on one occasion? 0   A score of 3 or more in women, and 4 or more in men indicates increased risk for alcohol abuse, EXCEPT if all of the points are from question 1   No results found for any visits on 04/05/23.  Assessment & Plan    Routine Health Maintenance and Physical Exam  Exercise Activities and Dietary recommendations --balanced diet high in fiber and protein, low in sugars, carbs, fats. --physical activity/exercise 30  minutes 3-5 times a week    Immunization History  Administered Date(s) Administered   Influenza, High Dose Seasonal PF 08/03/2019, 08/07/2020   Influenza-Unspecified 08/16/2016   Moderna Sars-Covid-2 Vaccination 07/20/2020, 08/17/2020, 02/10/2021   Pneumococcal Conjugate-13 01/28/2019   Pneumococcal Polysaccharide-23 10/08/2009, 12/09/2020   Td 01/28/2019   Tdap 10/08/2009    Health Maintenance  Topic Date Due   Zoster Vaccines- Shingrix (1 of 2) Never done   COVID-19 Vaccine (4 - 2023-24 season)  07/08/2022   Lung Cancer Screening  01/18/2023   MAMMOGRAM  05/04/2023   INFLUENZA VACCINE  06/08/2023   Medicare Annual Wellness (AWV)  12/20/2023   DEXA SCAN  02/02/2024   Colonoscopy  03/13/2025   DTaP/Tdap/Td (3 - Td or Tdap) 01/27/2029   Pneumonia Vaccine 11+ Years old  Completed   Hepatitis C Screening  Completed   HPV VACCINES  Aged Out    Discussed health benefits of physical activity, and encouraged her to engage in regular exercise appropriate for her age and condition.  1. Encounter for annual physical exam Reviewed labs. Advised she is due for cholesterol screen, but last few years LDL in range. Pt opts to wait until next year 2. Seasonal allergies Refilled claritin - loratadine (CLARITIN) 10 MG tablet; Take 1 tablet (10 mg total) by mouth daily.  Dispense: 30 tablet; Refill: 11  3. Bilateral impacted cerumen Flushed and clear  4. Breast cancer screening by mammogram - MM 3D SCREENING MAMMOGRAM BILATERAL BREAST; Future   Return in about 1 year (around 04/04/2024) for CPE.     I, Alfredia Ferguson, PA-C have reviewed all documentation for this visit. The documentation on  04/05/23   for the exam, diagnosis, procedures, and orders are all accurate and complete.  Alfredia Ferguson, PA-C [facility] [address] [place], [place], [address] Office: [phone] Fax: [phone]   [facility]

## 2023-05-05 ENCOUNTER — Ambulatory Visit
Admission: RE | Admit: 2023-05-05 | Discharge: 2023-05-05 | Disposition: A | Discharge status: Home / Self Care | Payer: 59 | Source: Ambulatory Visit | Attending: Physician Assistant | Admitting: Physician Assistant

## 2023-05-05 DIAGNOSIS — Z1231 Encounter for screening mammogram for malignant neoplasm of breast: Secondary | ICD-10-CM | POA: Diagnosis present

## 2023-07-18 ENCOUNTER — Ambulatory Visit (INDEPENDENT_AMBULATORY_CARE_PROVIDER_SITE_OTHER): Payer: 59 | Admitting: Family Medicine

## 2023-07-18 ENCOUNTER — Encounter: Payer: Self-pay | Admitting: Family Medicine

## 2023-07-18 VITALS — BP 121/54 | HR 67 | Ht 68.0 in | Wt 168.0 lb

## 2023-07-18 DIAGNOSIS — R350 Frequency of micturition: Secondary | ICD-10-CM

## 2023-07-18 DIAGNOSIS — N3000 Acute cystitis without hematuria: Secondary | ICD-10-CM

## 2023-07-18 DIAGNOSIS — Z23 Encounter for immunization: Secondary | ICD-10-CM | POA: Diagnosis not present

## 2023-07-18 DIAGNOSIS — R208 Other disturbances of skin sensation: Secondary | ICD-10-CM

## 2023-07-18 LAB — POCT URINALYSIS DIPSTICK
Bilirubin, UA: NEGATIVE
Glucose, UA: NEGATIVE
Ketones, UA: NEGATIVE
Nitrite, UA: NEGATIVE
Protein, UA: POSITIVE — AB
Spec Grav, UA: >=1.03 — AB (ref 1.010–1.025)
Urobilinogen, UA: 0.2 U/dL
pH, UA: 6 (ref 5.0–8.0)

## 2023-07-18 MED ORDER — CEPHALEXIN 500 MG PO CAPS: 500.0000 mg | ORAL_CAPSULE | Freq: Two times a day (BID) | ORAL | 0 refills | Status: AC

## 2023-07-18 NOTE — Progress Notes (Signed)
Established patient visit   Patient: Katherine Snyder   DOB: Sep 27, 1951   72 y.o. Female  MRN: [id number] Visit Date: 07/18/2023  Today's healthcare provider: Sherlyn Hay, DO   Chief Complaint  Patient presents with   Urinary Tract Infection    Urinary frequency and burning sensation while urinating and pelvic pain   Subjective    HPI Symptoms, as noted above, started this morning   Tobacco: 1/2 ppd for >30 years  - nodularity noted on previous CT  - followed clinically by Oncology (Dr. Cathie Hoops)   Cataract surgery - consult scheduled for 08/08/23     Medications: Outpatient Medications Prior to Visit  Medication Sig   betamethasone dipropionate 0.05 % cream Apply 1 application topically 2 (two) times daily as needed (for psoriasis on body).   DULoxetine (CYMBALTA) 30 MG capsule Take 1 capsule (30 mg total) by mouth daily.   FLUZONE HIGH-DOSE QUADRIVALENT 0.7 ML SUSY    loratadine (CLARITIN) 10 MG tablet Take 1 tablet (10 mg total) by mouth daily.   Multiple Vitamins-Minerals (CENTRUM SILVER ADULT 50+ PO) Take 1 tablet by mouth daily.   naproxen sodium (ALEVE) 220 MG tablet Take 220 mg by mouth daily as needed.   triamcinolone (KENALOG) 0.025 % cream Apply 1 application. topically daily as needed (for areas on face. avoid eyes).   No facility-administered medications prior to visit.    Review of Systems  Constitutional:  Negative for chills and fever.  Respiratory:  Negative for cough, shortness of breath and wheezing.   Cardiovascular:  Negative for chest pain, palpitations and leg swelling.  Gastrointestinal:  Negative for abdominal pain, constipation, diarrhea, nausea and vomiting.  Genitourinary:  Positive for decreased urine volume, dysuria, frequency and pelvic pain. Negative for hematuria and urgency.  Neurological:  Negative for weakness and headaches.        Objective    BP (!) 121/54 (BP Location: Left Arm, Patient Position: Sitting, Cuff Size:  Normal)   Pulse 67   Ht 5\' 8"  (1.727 m)   Wt 168 lb (76.2 kg)   SpO2 98%   BMI 25.54 kg/m     Physical Exam Constitutional:      Appearance: Normal appearance.  HENT:     Head: Normocephalic and atraumatic.  Eyes:     General: No scleral icterus.    Extraocular Movements: Extraocular movements intact.     Conjunctiva/sclera: Conjunctivae normal.  Cardiovascular:     Rate and Rhythm: Normal rate and regular rhythm.     Pulses: Normal pulses.     Heart sounds: Normal heart sounds.  Pulmonary:     Effort: Pulmonary effort is normal. No respiratory distress.     Breath sounds: Normal breath sounds.  Abdominal:     General: Bowel sounds are normal. There is no distension.     Palpations: Abdomen is soft. There is no mass.     Tenderness: There is no abdominal tenderness. There is no guarding.     Comments: Negative lloyd's sign bilaterally  Musculoskeletal:     Right lower leg: No edema.     Left lower leg: No edema.  Skin:    General: Skin is warm and dry.  Neurological:     Mental Status: She is alert and oriented to person, place, and time. Mental status is at baseline.  Psychiatric:        Mood and Affect: Mood normal.        Behavior: Behavior normal.  Results for orders placed or performed in visit on 07/18/23  Urine Culture   Specimen: Urine   UC  Result Value Ref Range   Urine Culture, Routine Final report    Organism ID, Bacteria Comment   POCT Urinalysis Dipstick  Result Value Ref Range   Color, UA     Clarity, UA     Glucose, UA Negative Negative   Bilirubin, UA negative    Ketones, UA negative    Spec Grav, UA >=1.030 (A) 1.010 - 1.025   Blood, UA trace    pH, UA 6.0 5.0 - 8.0   Protein, UA Positive (A) Negative   Urobilinogen, UA 0.2 0.2 or 1.0 E.U./dL   Nitrite, UA negative    Leukocytes, UA Moderate (2+) (A) Negative   Appearance     Odor      Assessment & Plan    Acute cystitis without hematuria -     Cephalexin; Take 1 capsule  (500 mg total) by mouth 2 (two) times daily for 5 days.  Dispense: 10 capsule; Refill: 0  Frequent urination -     POCT urinalysis dipstick -     Urine Culture -     Cephalexin; Take 1 capsule (500 mg total) by mouth 2 (two) times daily for 5 days.  Dispense: 10 capsule; Refill: 0  Burning sensation -     POCT urinalysis dipstick -     Cephalexin; Take 1 capsule (500 mg total) by mouth 2 (two) times daily for 5 days.  Dispense: 10 capsule; Refill: 0  Immunization due -     Flu Vaccine Trivalent High Dose (Fluad)  Influenza vaccine needed -     Flu Vaccine Trivalent High Dose (Fluad)  Urinalysis today positive for moderate leukocytes with protein noted, nitrates negative.  Will treat for presumptive urinary tract infection, given patient's symptoms and presence of leukocytes.  Will send for culture to evaluate for antibiotic effectiveness.     Return if symptoms worsen or fail to improve.      I discussed the assessment and treatment plan with the patient  The patient was provided an opportunity to ask questions and all were answered. The patient agreed with the plan and demonstrated an understanding of the instructions.   The patient was advised to call back or seek an in-person evaluation if the symptoms worsen or if the condition fails to improve as anticipated.    Sherlyn Hay, DO  [facility] [phone] (phone) [phone] (fax)  [facility]

## 2023-07-20 LAB — URINE CULTURE

## 2023-08-23 ENCOUNTER — Encounter: Payer: Self-pay | Admitting: Ophthalmology

## 2023-08-28 NOTE — Discharge Instructions (Signed)

## 2023-08-30 ENCOUNTER — Ambulatory Visit: Payer: 59 | Admitting: Anesthesiology

## 2023-08-30 ENCOUNTER — Other Ambulatory Visit: Payer: Self-pay

## 2023-08-30 ENCOUNTER — Encounter
Admission: RE | Disposition: A | Discharge status: Home / Self Care | Payer: Self-pay | Source: Home / Self Care | Attending: Ophthalmology

## 2023-08-30 ENCOUNTER — Ambulatory Visit: Payer: Medicare Other

## 2023-08-30 ENCOUNTER — Encounter: Payer: Self-pay | Admitting: Ophthalmology

## 2023-08-30 ENCOUNTER — Ambulatory Visit
Admission: RE | Admit: 2023-08-30 | Discharge: 2023-08-30 | Disposition: A | Discharge status: Home / Self Care | Payer: 59 | Attending: Ophthalmology | Admitting: Ophthalmology

## 2023-08-30 DIAGNOSIS — Z9221 Personal history of antineoplastic chemotherapy: Secondary | ICD-10-CM | POA: Insufficient documentation

## 2023-08-30 DIAGNOSIS — Z79899 Other long term (current) drug therapy: Secondary | ICD-10-CM | POA: Insufficient documentation

## 2023-08-30 DIAGNOSIS — Z923 Personal history of irradiation: Secondary | ICD-10-CM | POA: Insufficient documentation

## 2023-08-30 DIAGNOSIS — F17210 Nicotine dependence, cigarettes, uncomplicated: Secondary | ICD-10-CM | POA: Insufficient documentation

## 2023-08-30 DIAGNOSIS — Z8542 Personal history of malignant neoplasm of other parts of uterus: Secondary | ICD-10-CM | POA: Diagnosis not present

## 2023-08-30 DIAGNOSIS — K219 Gastro-esophageal reflux disease without esophagitis: Secondary | ICD-10-CM | POA: Insufficient documentation

## 2023-08-30 DIAGNOSIS — M1990 Unspecified osteoarthritis, unspecified site: Secondary | ICD-10-CM | POA: Diagnosis not present

## 2023-08-30 DIAGNOSIS — H2512 Age-related nuclear cataract, left eye: Secondary | ICD-10-CM | POA: Diagnosis present

## 2023-08-30 DIAGNOSIS — J449 Chronic obstructive pulmonary disease, unspecified: Secondary | ICD-10-CM | POA: Diagnosis not present

## 2023-08-30 HISTORY — PX: CATARACT EXTRACTION W/PHACO: SHX586

## 2023-08-30 HISTORY — DX: Presence of dental prosthetic device (complete) (partial): Z97.2

## 2023-08-30 SURGERY — PHACOEMULSIFICATION, CATARACT, WITH IOL INSERTION
Anesthesia: Monitor Anesthesia Care | Site: Eye | Laterality: Left

## 2023-08-30 MED ORDER — TETRACAINE HCL 0.5 % OP SOLN
OPHTHALMIC | Status: AC
  Filled 2023-08-30: qty 4

## 2023-08-30 MED ORDER — SIGHTPATH DOSE#1 BSS IO SOLN
INTRAOCULAR | Status: DC | PRN
  Administered 2023-08-30: 15 mL

## 2023-08-30 MED ORDER — MIDAZOLAM HCL 2 MG/2ML IJ SOLN
INTRAMUSCULAR | Status: AC
  Filled 2023-08-30: qty 2

## 2023-08-30 MED ORDER — SIGHTPATH DOSE#1 BSS IO SOLN
INTRAOCULAR | Status: DC | PRN
  Administered 2023-08-30: 56 mL via OPHTHALMIC

## 2023-08-30 MED ORDER — ARMC OPHTHALMIC DILATING DROPS
OPHTHALMIC | Status: AC
Start: 2023-08-30 — End: ?
  Filled 2023-08-30: qty 0.5

## 2023-08-30 MED ORDER — FENTANYL CITRATE (PF) 100 MCG/2ML IJ SOLN
INTRAMUSCULAR | Status: DC | PRN
  Administered 2023-08-30: 50 ug via INTRAVENOUS

## 2023-08-30 MED ORDER — SIGHTPATH DOSE#1 BSS IO SOLN
INTRAOCULAR | Status: DC | PRN
  Administered 2023-08-30: 1 mL

## 2023-08-30 MED ORDER — CEFUROXIME OPHTHALMIC INJECTION 1 MG/0.1 ML
INJECTION | OPHTHALMIC | Status: DC | PRN
  Administered 2023-08-30: .1 mL via INTRACAMERAL

## 2023-08-30 MED ORDER — BRIMONIDINE TARTRATE-TIMOLOL 0.2-0.5 % OP SOLN
OPHTHALMIC | Status: DC | PRN
  Administered 2023-08-30: 1 [drp] via OPHTHALMIC

## 2023-08-30 MED ORDER — MIDAZOLAM HCL 2 MG/2ML IJ SOLN
INTRAMUSCULAR | Status: DC | PRN
  Administered 2023-08-30: 1 mg via INTRAVENOUS

## 2023-08-30 MED ORDER — FENTANYL CITRATE (PF) 100 MCG/2ML IJ SOLN
INTRAMUSCULAR | Status: AC
  Filled 2023-08-30: qty 2

## 2023-08-30 MED ORDER — SODIUM CHLORIDE 0.9% FLUSH: 10.0000 mL | Freq: Two times a day (BID) | INTRAVENOUS | Status: DC

## 2023-08-30 MED ORDER — ARMC OPHTHALMIC DILATING DROPS
1.0000 | OPHTHALMIC | Status: DC | PRN
  Administered 2023-08-30 (×3): 1 via OPHTHALMIC

## 2023-08-30 MED ORDER — SIGHTPATH DOSE#1 NA HYALUR & NA CHOND-NA HYALUR IO KIT
PACK | INTRAOCULAR | Status: DC | PRN
  Administered 2023-08-30: 1 via OPHTHALMIC

## 2023-08-30 MED ORDER — TETRACAINE HCL 0.5 % OP SOLN
1.0000 [drp] | OPHTHALMIC | Status: DC | PRN
  Administered 2023-08-30 (×3): 1 [drp] via OPHTHALMIC

## 2023-08-30 SURGICAL SUPPLY — 18 items
CANNULA ANT/CHMB 27G (MISCELLANEOUS) IMPLANT
CANNULA ANT/CHMB 27GA (MISCELLANEOUS)
CATARACT SUITE SIGHTPATH (MISCELLANEOUS) ×1
FEE CATARACT SUITE SIGHTPATH (MISCELLANEOUS) ×1 IMPLANT
GLOVE SRG 8 PF TXTR STRL LF DI (GLOVE) ×1 IMPLANT
GLOVE SURG ENC TEXT LTX SZ7.5 (GLOVE) ×1 IMPLANT
GLOVE SURG GAMMEX PI TX LF 7.5 (GLOVE) IMPLANT
GLOVE SURG UNDER POLY LF SZ8 (GLOVE) ×1
LENS IOL TECNIS EYHANCE 22.5 (Intraocular Lens) IMPLANT
NDL FILTER BLUNT 18X1 1/2 (NEEDLE) ×1 IMPLANT
NDL RETROBULBAR .5 NSTRL (NEEDLE) IMPLANT
NEEDLE FILTER BLUNT 18X1 1/2 (NEEDLE) ×1
PACK VIT ANT 23G (MISCELLANEOUS) IMPLANT
RING MALYGIN 7.0 (MISCELLANEOUS) IMPLANT
SUT ETHILON 10-0 CS-B-6CS-B-6 (SUTURE)
SUT VICRYL 9 0 (SUTURE) IMPLANT
SUTURE EHLN 10-0 CS-B-6CS-B-6 (SUTURE) IMPLANT
SYR 3ML LL SCALE MARK (SYRINGE) ×1 IMPLANT

## 2023-08-30 NOTE — Anesthesia Preprocedure Evaluation (Addendum)
Anesthesia Evaluation  Patient identified by MRN, date of birth, ID band Patient awake    Reviewed: Allergy & Precautions, NPO status , Patient's Chart, lab work & pertinent test results  History of Anesthesia Complications Negative for: history of anesthetic complications  Airway Mallampati: IV   Neck ROM: Full    Dental  (+) Edentulous Upper, Edentulous Lower   Pulmonary COPD, Current Smoker (1/2 ppd) and Patient abstained from smoking.   Pulmonary exam normal breath sounds clear to auscultation       Cardiovascular negative cardio ROS Normal cardiovascular exam Rhythm:Regular Rate:Normal     Neuro/Psych negative neurological ROS     GI/Hepatic ,GERD  Medicated and Controlled,,  Endo/Other  negative endocrine ROS    Renal/GU Renal disease (nephrolithiasis)     Musculoskeletal  (+) Arthritis ,    Abdominal   Peds  Hematology negative hematology ROS (+)   Anesthesia Other Findings   Reproductive/Obstetrics Endometrial CA                             Anesthesia Physical Anesthesia Plan  ASA: 2  Anesthesia Plan: MAC   Post-op Pain Management:    Induction: Intravenous  PONV Risk Score and Plan: 1 and Treatment may vary due to age or medical condition, Midazolam and TIVA  Airway Management Planned: Natural Airway and Nasal Cannula  Additional Equipment:   Intra-op Plan:   Post-operative Plan:   Informed Consent: I have reviewed the patients History and Physical, chart, labs and discussed the procedure including the risks, benefits and alternatives for the proposed anesthesia with the patient or authorized representative who has indicated his/her understanding and acceptance.     Dental advisory given  Plan Discussed with: CRNA  Anesthesia Plan Comments: (LMA/GETA backup discussed.  Patient consented for risks of anesthesia including but not limited to:  - adverse  reactions to medications - damage to eyes, teeth, lips or other oral mucosa - nerve damage due to positioning  - sore throat or hoarseness - damage to heart, brain, nerves, lungs, other parts of body or loss of life  Informed patient about role of CRNA in peri- and intra-operative care.  Patient voiced understanding.)       Anesthesia Quick Evaluation

## 2023-08-30 NOTE — Transfer of Care (Signed)
Immediate Anesthesia Transfer of Care Note  Patient: Katherine Snyder  Procedure(s) Performed: CATARACT EXTRACTION PHACO AND INTRAOCULAR LENS PLACEMENT (IOC) LEFT (Left: Eye)  Patient Location: PACU  Anesthesia Type: MAC  Level of Consciousness: awake, alert  and patient cooperative  Airway and Oxygen Therapy: Patient Spontanous Breathing and Patient connected to supplemental oxygen  Post-op Assessment: Post-op Vital signs reviewed, Patient's Cardiovascular Status Stable, Respiratory Function Stable, Patent Airway and No signs of Nausea or vomiting  Post-op Vital Signs: Reviewed and stable  Complications: No notable events documented.

## 2023-08-30 NOTE — H&P (Signed)
[facility]   Primary Care Physician:  Sherlyn Hay, DO Ophthalmologist: Dr. Lockie Mola  Pre-Procedure History & Physical: HPI:  Katherine Snyder is a 72 y.o. female here for ophthalmic surgery.   Past Medical History:  Diagnosis Date   Arthritis    Cancer (HCC) 2019   endometrial   Endometrial mass    History of chicken pox    History of kidney stones    History of measles    History of mumps    Osteoporosis    Personal history of chemotherapy    Personal history of radiation therapy    PMB (postmenopausal bleeding)    Psoriasis    Wears dentures    full upper    Past Surgical History:  Procedure Laterality Date   APPENDECTOMY  1960   BREAST EXCISIONAL BIOPSY Left 1980   neg   BREAST SURGERY Left    biopsy   COLONOSCOPY WITH PROPOFOL N/A 03/13/2020   Procedure: COLONOSCOPY WITH PROPOFOL;  Surgeon: Wyline Mood, MD;  Location: [facility] ENDOSCOPY;  Service: Gastroenterology;  Laterality: N/A;   Injection varicose veins in legs Bilateral 2010   Dr. Earnestine Leys   LITHOTRIPSY  1994, 2004   for renal stones   PORTA CATH INSERTION N/A 04/24/2018   Procedure: PORTA CATH INSERTION;  Surgeon: Renford Dills, MD;  Location: [facility] INVASIVE CV LAB;  Service: Cardiovascular;  Laterality: N/A;   PORTA CATH REMOVAL N/A 01/18/2021   Procedure: PORTA CATH REMOVAL;  Surgeon: Annice Needy, MD;  Location: [facility] INVASIVE CV LAB;  Service: Cardiovascular;  Laterality: N/A;   ROBOTIC ASSISTED TOTAL HYSTERECTOMY Bilateral 04/04/2018   Procedure: ROBOTIC ASSISTED TOTAL HYSTERECTOMY,SENTINEL LYMPH NODE MAPPING AND BIOPSIES,AORTIC LYMPH NODE DISSECTION;  Surgeon: Artelia Laroche, MD;  Location: [facility] ORS;  Service: Gynecology;  Laterality: Bilateral;   TUBAL LIGATION  1980    Prior to Admission medications   Medication Sig Start Date End Date Taking? Authorizing Provider  DULoxetine (CYMBALTA) 30 MG capsule Take 1 capsule (30 mg total) by mouth daily. 03/02/23  Yes Rickard Patience, MD   Multiple Vitamins-Minerals (CENTRUM SILVER ADULT 50+ PO) Take 2 tablets by mouth daily.   Yes [provider]  naproxen sodium (ALEVE) 220 MG tablet Take 220 mg by mouth daily as needed.   Yes [provider]  triamcinolone (KENALOG) 0.025 % cream Apply 1 application. topically daily as needed (for areas on face. avoid eyes). 03/24/22  Yes Drubel, Lillia Abed, PA-C  FLUZONE HIGH-DOSE QUADRIVALENT 0.7 ML SUSY  10/19/21   [provider]  loratadine (CLARITIN) 10 MG tablet Take 1 tablet (10 mg total) by mouth daily. Patient not taking: Reported on 08/23/2023 04/05/23   Alfredia Ferguson, PA-C    Allergies as of 08/14/2023   (No Known Allergies)    Family History  Problem Relation Age of Onset   Breast cancer Mother    Transient ischemic attack Mother    Arthritis Mother    Hypothyroidism Mother    Lung cancer Mother    Diabetes Father        type 2   Hypertension Father    Arthritis Father    Colon cancer Sister    Prostate cancer Brother    Hyperlipidemia Son    Stomach cancer Maternal Grandmother    Stroke Maternal Grandfather     Social History   Socioeconomic History   Marital status: Divorced    Spouse name: single   Number of children: 1   Years of  education: Not on file   Highest education level: 10th grade  Occupational History   Occupation: retired  Tobacco Use   Smoking status: Every Day    Current packs/day: 0.50    Average packs/day: 0.5 packs/day for 30.8 years (15.4 ttl pk-yrs)    Types: Cigarettes    Start date: 1994   Smokeless tobacco: Never  Vaping Use   Vaping status: Former  Substance and Sexual Activity   Alcohol use: No    Alcohol/week: 0.0 standard drinks of alcohol   Drug use: No   Sexual activity: Not Currently  Other Topics Concern   Not on file  Social History Narrative   Not on file   Social Determinants of Health   Financial Resource Strain: Low Risk  (12/19/2022)   Overall Financial Resource Strain (CARDIA)     Difficulty of Paying Living Expenses: Not very hard  Food Insecurity: No Food Insecurity (12/19/2022)   Hunger Vital Sign    Worried About Running Out of Food in the Last Year: Never true    Ran Out of Food in the Last Year: Never true  Transportation Needs: No Transportation Needs (12/19/2022)   PRAPARE - Administrator, Civil Service (Medical): No    Lack of Transportation (Non-Medical): No  Physical Activity: Inactive (12/19/2022)   Exercise Vital Sign    Days of Exercise per Week: 0 days    Minutes of Exercise per Session: 0 min  Stress: Stress Concern Present (12/19/2022)   [employer] of Occupational Health - Occupational Stress Questionnaire    Feeling of Stress : To some extent  Social Connections: Moderately Integrated (12/19/2022)   Social Connection and Isolation Panel [NHANES]    Frequency of Communication with Friends and Family: More than three times a week    Frequency of Social Gatherings with Friends and Family: More than three times a week    Attends Religious Services: 1 to 4 times per year    Active Member of [employer] or Organizations: Yes    Attends Banker Meetings: Never    Marital Status: Divorced  Catering manager Violence: Not At Risk (12/19/2022)   Humiliation, Afraid, Rape, and Kick questionnaire    Fear of Current or Ex-Partner: No    Emotionally Abused: No    Physically Abused: No    Sexually Abused: No    Review of Systems: See HPI, otherwise negative ROS  Physical Exam: BP 129/68   Pulse 61   Temp (!) 97.4 F (36.3 C) (Temporal)   Resp 13   Ht 5' 7.01" (1.702 m)   Wt 77.2 kg   SpO2 94%   BMI 26.67 kg/m  General:   Alert,  pleasant and cooperative in NAD Head:  Normocephalic and atraumatic. Lungs:  Clear to auscultation.    Heart:  Regular rate and rhythm.   Impression/Plan: Katherine Snyder is here for ophthalmic surgery.  Risks, benefits, limitations, and alternatives regarding ophthalmic surgery have been  reviewed with the patient.  Questions have been answered.  All parties agreeable.   Lockie Mola, MD  08/30/2023, 7:31 AM

## 2023-08-30 NOTE — Anesthesia Postprocedure Evaluation (Signed)
Anesthesia Post Note  Patient: Katherine Snyder  Procedure(s) Performed: CATARACT EXTRACTION PHACO AND INTRAOCULAR LENS PLACEMENT (IOC) LEFT (Left: Eye)  Patient location during evaluation: PACU Anesthesia Type: MAC Level of consciousness: awake and alert, oriented and patient cooperative Pain management: pain level controlled Vital Signs Assessment: post-procedure vital signs reviewed and stable Respiratory status: spontaneous breathing, nonlabored ventilation and respiratory function stable Cardiovascular status: blood pressure returned to baseline and stable Postop Assessment: adequate PO intake Anesthetic complications: no   No notable events documented.   Last Vitals:  Vitals:   08/30/23 0826 08/30/23 0831  BP: 131/73   Pulse: 61   Resp: 13   Temp: 36.6 C 36.6 C  SpO2: 99%     Last Pain:  Vitals:   08/30/23 0831  TempSrc:   PainSc: 0-No pain                 Reed Breech

## 2023-08-30 NOTE — Op Note (Signed)
OPERATIVE NOTE  Katherine Snyder [id number] 08/30/2023   PREOPERATIVE DIAGNOSIS:  Nuclear sclerotic cataract left eye. H25.12   POSTOPERATIVE DIAGNOSIS:    Nuclear sclerotic cataract left eye.     PROCEDURE:  Phacoemusification with posterior chamber intraocular lens placement of the left eye  Ultrasound time: Procedure(s) with comments: CATARACT EXTRACTION PHACO AND INTRAOCULAR LENS PLACEMENT (IOC) LEFT (Left) - 6.13 0:39.9  LENS:   Implant Name Type Inv. Item Serial No. Manufacturer Lot No. LRB No. Used Action  LENS IOL TECNIS EYHANCE 22.5 - [id number] Intraocular Lens LENS IOL TECNIS EYHANCE 22.5 [id number] SIGHTPATH  Left 1 Implanted      SURGEON:  Deirdre Evener, MD   ANESTHESIA:  Topical with tetracaine drops and 2% Xylocaine jelly, augmented with 1% preservative-free intracameral lidocaine.    COMPLICATIONS:  None.   DESCRIPTION OF PROCEDURE:  The patient was identified in the holding room and transported to the operating room and placed in the supine position under the operating microscope.  The left eye was identified as the operative eye and it was prepped and draped in the usual sterile ophthalmic fashion.   A 1 millimeter clear-corneal paracentesis was made at the 1:30 position.  0.5 ml of preservative-free 1% lidocaine was injected into the anterior chamber.  The anterior chamber was filled with Viscoat viscoelastic.  A 2.4 millimeter keratome was used to make a near-clear corneal incision at the 10:30 position.  .  A curvilinear capsulorrhexis was made with a cystotome and capsulorrhexis forceps.  Balanced salt solution was used to hydrodissect and hydrodelineate the nucleus.   Phacoemulsification was then used in stop and chop fashion to remove the lens nucleus and epinucleus.  The remaining cortex was then removed using the irrigation and aspiration handpiece. Provisc was then placed into the capsular bag to distend it for lens placement.  A lens was then  injected into the capsular bag.  The remaining viscoelastic was aspirated.   Wounds were hydrated with balanced salt solution.  The anterior chamber was inflated to a physiologic pressure with balanced salt solution.  No wound leaks were noted. Cefuroxime 0.1 ml of a 10mg /ml solution was injected into the anterior chamber for a dose of 1 mg of intracameral antibiotic at the completion of the case.   Timolol and Brimonidine drops were applied to the eye.  The patient was taken to the recovery room in stable condition without complications of anesthesia or surgery.  [name] 08/30/2023, 8:24 AM

## 2023-08-31 ENCOUNTER — Encounter: Payer: Self-pay | Admitting: Ophthalmology

## 2023-09-06 NOTE — Anesthesia Preprocedure Evaluation (Addendum)
Anesthesia Evaluation  Patient identified by MRN, date of birth, ID band Patient awake    Reviewed: Allergy & Precautions, H&P , NPO status , Patient's Chart, lab work & pertinent test results  Airway Mallampati: IV  TM Distance: <3 FB     Dental  (+) Edentulous Upper, Edentulous Lower   Pulmonary neg pulmonary ROS, COPD, Current Smoker and Patient abstained from smoking.          Cardiovascular negative cardio ROS      Neuro/Psych  Neuromuscular disease negative neurological ROS  negative psych ROS   GI/Hepatic negative GI ROS, Neg liver ROS,,,  Endo/Other  negative endocrine ROS    Renal/GU Renal diseasenegative Renal ROS  negative genitourinary   Musculoskeletal negative musculoskeletal ROS (+) Arthritis ,    Abdominal   Peds negative pediatric ROS (+)  Hematology negative hematology ROS (+)   Anesthesia Other Findings Previous cataract surgery 08-30-23 Dr. Ronni Rumble anesthesiologist    Psoriasis  History of kidney stones Osteoporosis PMB (postmenopausal bleeding) Endometrial mass Arthritis  Personal history of chemotherapy Personal history of radiation therapy Cancer  Wears dentures      Reproductive/Obstetrics negative OB ROS                             Anesthesia Physical Anesthesia Plan  ASA: 3  Anesthesia Plan: MAC   Post-op Pain Management:    Induction: Intravenous  PONV Risk Score and Plan:   Airway Management Planned: Natural Airway and Nasal Cannula  Additional Equipment:   Intra-op Plan:   Post-operative Plan:   Informed Consent: I have reviewed the patients History and Physical, chart, labs and discussed the procedure including the risks, benefits and alternatives for the proposed anesthesia with the patient or authorized representative who has indicated his/her understanding and acceptance.     Dental Advisory Given  Plan Discussed with:  Anesthesiologist, CRNA and Surgeon  Anesthesia Plan Comments: (Patient consented for risks of anesthesia including but not limited to:  - adverse reactions to medications - damage to eyes, teeth, lips or other oral mucosa - nerve damage due to positioning  - sore throat or hoarseness - Damage to heart, brain, nerves, lungs, other parts of body or loss of life  Patient voiced understanding and assent.)        Anesthesia Quick Evaluation

## 2023-09-12 NOTE — Discharge Instructions (Signed)

## 2023-09-13 ENCOUNTER — Encounter: Payer: Self-pay | Admitting: Ophthalmology

## 2023-09-13 ENCOUNTER — Ambulatory Visit: Payer: 59 | Admitting: Anesthesiology

## 2023-09-13 ENCOUNTER — Encounter
Admission: RE | Disposition: A | Discharge status: Home / Self Care | Payer: Self-pay | Source: Home / Self Care | Attending: Ophthalmology

## 2023-09-13 ENCOUNTER — Ambulatory Visit
Admission: RE | Admit: 2023-09-13 | Discharge: 2023-09-13 | Disposition: A | Discharge status: Home / Self Care | Payer: 59 | Attending: Ophthalmology | Admitting: Ophthalmology

## 2023-09-13 ENCOUNTER — Other Ambulatory Visit: Payer: Self-pay

## 2023-09-13 DIAGNOSIS — J449 Chronic obstructive pulmonary disease, unspecified: Secondary | ICD-10-CM | POA: Insufficient documentation

## 2023-09-13 DIAGNOSIS — H2511 Age-related nuclear cataract, right eye: Secondary | ICD-10-CM | POA: Insufficient documentation

## 2023-09-13 DIAGNOSIS — Z8542 Personal history of malignant neoplasm of other parts of uterus: Secondary | ICD-10-CM | POA: Diagnosis not present

## 2023-09-13 DIAGNOSIS — Z923 Personal history of irradiation: Secondary | ICD-10-CM | POA: Insufficient documentation

## 2023-09-13 DIAGNOSIS — Z9221 Personal history of antineoplastic chemotherapy: Secondary | ICD-10-CM | POA: Diagnosis not present

## 2023-09-13 DIAGNOSIS — F17210 Nicotine dependence, cigarettes, uncomplicated: Secondary | ICD-10-CM | POA: Diagnosis not present

## 2023-09-13 HISTORY — PX: CATARACT EXTRACTION W/PHACO: SHX586

## 2023-09-13 SURGERY — PHACOEMULSIFICATION, CATARACT, WITH IOL INSERTION
Anesthesia: Monitor Anesthesia Care | Laterality: Right

## 2023-09-13 MED ORDER — SIGHTPATH DOSE#1 NA HYALUR & NA CHOND-NA HYALUR IO KIT
PACK | INTRAOCULAR | Status: DC | PRN
  Administered 2023-09-13: 1 via OPHTHALMIC

## 2023-09-13 MED ORDER — CEFUROXIME OPHTHALMIC INJECTION 1 MG/0.1 ML
INJECTION | OPHTHALMIC | Status: DC | PRN
  Administered 2023-09-13: 1 mg via INTRACAMERAL

## 2023-09-13 MED ORDER — SIGHTPATH DOSE#1 BSS IO SOLN
INTRAOCULAR | Status: DC | PRN
  Administered 2023-09-13: 15 mL via INTRAOCULAR

## 2023-09-13 MED ORDER — FENTANYL CITRATE (PF) 100 MCG/2ML IJ SOLN
INTRAMUSCULAR | Status: DC | PRN
  Administered 2023-09-13 (×2): 50 ug via INTRAVENOUS

## 2023-09-13 MED ORDER — TETRACAINE HCL 0.5 % OP SOLN
OPHTHALMIC | Status: AC
  Filled 2023-09-13: qty 4

## 2023-09-13 MED ORDER — BRIMONIDINE TARTRATE-TIMOLOL 0.2-0.5 % OP SOLN
OPHTHALMIC | Status: DC | PRN
  Administered 2023-09-13: 1 [drp] via OPHTHALMIC

## 2023-09-13 MED ORDER — TETRACAINE HCL 0.5 % OP SOLN
1.0000 [drp] | OPHTHALMIC | Status: DC | PRN
  Administered 2023-09-13 (×3): 1 [drp] via OPHTHALMIC

## 2023-09-13 MED ORDER — ARMC OPHTHALMIC DILATING DROPS
OPHTHALMIC | Status: AC
Start: 2023-09-13 — End: ?
  Filled 2023-09-13: qty 0.5

## 2023-09-13 MED ORDER — FENTANYL CITRATE (PF) 100 MCG/2ML IJ SOLN
INTRAMUSCULAR | Status: AC
  Filled 2023-09-13: qty 2

## 2023-09-13 MED ORDER — ARMC OPHTHALMIC DILATING DROPS
1.0000 | OPHTHALMIC | Status: DC | PRN
  Administered 2023-09-13 (×3): 1 via OPHTHALMIC

## 2023-09-13 MED ORDER — SIGHTPATH DOSE#1 BSS IO SOLN
INTRAOCULAR | Status: DC | PRN
  Administered 2023-09-13: 49 mL via OPHTHALMIC

## 2023-09-13 MED ORDER — SIGHTPATH DOSE#1 BSS IO SOLN
INTRAOCULAR | Status: DC | PRN
  Administered 2023-09-13: 2 mL

## 2023-09-13 MED ORDER — MIDAZOLAM HCL 2 MG/2ML IJ SOLN
INTRAMUSCULAR | Status: DC | PRN
  Administered 2023-09-13 (×2): 1 mg via INTRAVENOUS

## 2023-09-13 MED ORDER — MIDAZOLAM HCL 2 MG/2ML IJ SOLN
INTRAMUSCULAR | Status: AC
  Filled 2023-09-13: qty 2

## 2023-09-13 SURGICAL SUPPLY — 9 items
CATARACT SUITE SIGHTPATH (MISCELLANEOUS) ×1
FEE CATARACT SUITE SIGHTPATH (MISCELLANEOUS) ×1 IMPLANT
GLOVE SRG 8 PF TXTR STRL LF DI (GLOVE) ×1 IMPLANT
GLOVE SURG ENC TEXT LTX SZ7.5 (GLOVE) ×1 IMPLANT
GLOVE SURG UNDER POLY LF SZ8 (GLOVE) ×1
LENS IOL TECNIS EYHANCE 23.0 (Intraocular Lens) IMPLANT
NDL FILTER BLUNT 18X1 1/2 (NEEDLE) ×1 IMPLANT
NEEDLE FILTER BLUNT 18X1 1/2 (NEEDLE) ×1
SYR 3ML LL SCALE MARK (SYRINGE) ×1 IMPLANT

## 2023-09-13 NOTE — H&P (Signed)
[facility]   Primary Care Physician:  Sherlyn Hay, DO Ophthalmologist: Dr. Lockie Mola  Pre-Procedure History & Physical: HPI:  Katherine Snyder is a 72 y.o. female here for ophthalmic surgery.   Past Medical History:  Diagnosis Date   Arthritis    Cancer (HCC) 2019   endometrial   Endometrial mass    History of chicken pox    History of kidney stones    History of measles    History of mumps    Osteoporosis    Personal history of chemotherapy    Personal history of radiation therapy    PMB (postmenopausal bleeding)    Psoriasis    Wears dentures    full upper    Past Surgical History:  Procedure Laterality Date   APPENDECTOMY  1960   BREAST EXCISIONAL BIOPSY Left 1980   neg   BREAST SURGERY Left    biopsy   CATARACT EXTRACTION W/PHACO Left 08/30/2023   Procedure: CATARACT EXTRACTION PHACO AND INTRAOCULAR LENS PLACEMENT (IOC) LEFT;  Surgeon: Lockie Mola, MD;  Location: [facility];  Service: Ophthalmology;  Laterality: Left;  6.13 0:39.9   COLONOSCOPY WITH PROPOFOL N/A 03/13/2020   Procedure: COLONOSCOPY WITH PROPOFOL;  Surgeon: Wyline Mood, MD;  Location: [facility] ENDOSCOPY;  Service: Gastroenterology;  Laterality: N/A;   Injection varicose veins in legs Bilateral 2010   Dr. Earnestine Leys   LITHOTRIPSY  1994, 2004   for renal stones   PORTA CATH INSERTION N/A 04/24/2018   Procedure: PORTA CATH INSERTION;  Surgeon: Renford Dills, MD;  Location: [facility] INVASIVE CV LAB;  Service: Cardiovascular;  Laterality: N/A;   PORTA CATH REMOVAL N/A 01/18/2021   Procedure: PORTA CATH REMOVAL;  Surgeon: Annice Needy, MD;  Location: [facility] INVASIVE CV LAB;  Service: Cardiovascular;  Laterality: N/A;   ROBOTIC ASSISTED TOTAL HYSTERECTOMY Bilateral 04/04/2018   Procedure: ROBOTIC ASSISTED TOTAL HYSTERECTOMY,SENTINEL LYMPH NODE MAPPING AND BIOPSIES,AORTIC LYMPH NODE DISSECTION;  Surgeon: Artelia Laroche, MD;  Location: [facility] ORS;  Service: Gynecology;   Laterality: Bilateral;   TUBAL LIGATION  1980    Prior to Admission medications   Medication Sig Start Date End Date Taking? Authorizing Provider  DULoxetine (CYMBALTA) 30 MG capsule Take 1 capsule (30 mg total) by mouth daily. 03/02/23  Yes Rickard Patience, MD  Multiple Vitamins-Minerals (CENTRUM SILVER ADULT 50+ PO) Take 2 tablets by mouth daily.   Yes [provider]  naproxen sodium (ALEVE) 220 MG tablet Take 220 mg by mouth daily as needed.   Yes [provider]  triamcinolone (KENALOG) 0.025 % cream Apply 1 application. topically daily as needed (for areas on face. avoid eyes). 03/24/22  Yes Drubel, Lillia Abed, PA-C  FLUZONE HIGH-DOSE QUADRIVALENT 0.7 ML SUSY  10/19/21   [provider]  loratadine (CLARITIN) 10 MG tablet Take 1 tablet (10 mg total) by mouth daily. Patient not taking: Reported on 08/23/2023 04/05/23   Alfredia Ferguson, PA-C    Allergies as of 08/14/2023   (No Known Allergies)    Family History  Problem Relation Age of Onset   Breast cancer Mother    Transient ischemic attack Mother    Arthritis Mother    Hypothyroidism Mother    Lung cancer Mother    Diabetes Father        type 2   Hypertension Father    Arthritis Father    Colon cancer Sister    Prostate cancer Brother    Hyperlipidemia Son    Stomach cancer Maternal  Grandmother    Stroke Maternal Grandfather     Social History   Socioeconomic History   Marital status: Divorced    Spouse name: single   Number of children: 1   Years of education: Not on file   Highest education level: 10th grade  Occupational History   Occupation: retired  Tobacco Use   Smoking status: Every Day    Current packs/day: 0.50    Average packs/day: 0.5 packs/day for 30.8 years (15.4 ttl pk-yrs)    Types: Cigarettes    Start date: 1994   Smokeless tobacco: Never  Vaping Use   Vaping status: Former  Substance and Sexual Activity   Alcohol use: No    Alcohol/week: 0.0 standard drinks of alcohol    Drug use: No   Sexual activity: Not Currently  Other Topics Concern   Not on file  Social History Narrative   Not on file   Social Determinants of Health   Financial Resource Strain: Low Risk  (12/19/2022)   Overall Financial Resource Strain (CARDIA)    Difficulty of Paying Living Expenses: Not very hard  Food Insecurity: No Food Insecurity (12/19/2022)   Hunger Vital Sign    Worried About Running Out of Food in the Last Year: Never true    Ran Out of Food in the Last Year: Never true  Transportation Needs: No Transportation Needs (12/19/2022)   PRAPARE - Administrator, Civil Service (Medical): No    Lack of Transportation (Non-Medical): No  Physical Activity: Inactive (12/19/2022)   Exercise Vital Sign    Days of Exercise per Week: 0 days    Minutes of Exercise per Session: 0 min  Stress: Stress Concern Present (12/19/2022)   [employer] of Occupational Health - Occupational Stress Questionnaire    Feeling of Stress : To some extent  Social Connections: Moderately Integrated (12/19/2022)   Social Connection and Isolation Panel [NHANES]    Frequency of Communication with Friends and Family: More than three times a week    Frequency of Social Gatherings with Friends and Family: More than three times a week    Attends Religious Services: 1 to 4 times per year    Active Member of [employer] or Organizations: Yes    Attends Banker Meetings: Never    Marital Status: Divorced  Catering manager Violence: Not At Risk (12/19/2022)   Humiliation, Afraid, Rape, and Kick questionnaire    Fear of Current or Ex-Partner: No    Emotionally Abused: No    Physically Abused: No    Sexually Abused: No    Review of Systems: See HPI, otherwise negative ROS  Physical Exam: BP 136/73   Temp 97.9 F (36.6 C) (Temporal)   Resp 16   Ht 5' 7.01" (1.702 m)   Wt 76.7 kg   SpO2 94%   BMI 26.46 kg/m  General:   Alert,  pleasant and cooperative in NAD Head:  Normocephalic  and atraumatic. Lungs:  Clear to auscultation.    Heart:  Regular rate and rhythm.   Impression/Plan: Katherine Snyder is here for ophthalmic surgery.  Risks, benefits, limitations, and alternatives regarding ophthalmic surgery have been reviewed with the patient.  Questions have been answered.  All parties agreeable.   Lockie Mola, MD  09/13/2023, 10:31 AM

## 2023-09-13 NOTE — Anesthesia Postprocedure Evaluation (Signed)
Anesthesia Post Note  Patient: Katherine Snyder  Procedure(s) Performed: CATARACT EXTRACTION PHACO AND INTRAOCULAR LENS PLACEMENT (IOC) RIGHT 5.46 00:26.9 (Right)  Patient location during evaluation: PACU Anesthesia Type: MAC Level of consciousness: awake and alert Pain management: pain level controlled Vital Signs Assessment: post-procedure vital signs reviewed and stable Respiratory status: spontaneous breathing, nonlabored ventilation, respiratory function stable and patient connected to nasal cannula oxygen Cardiovascular status: stable and blood pressure returned to baseline Postop Assessment: no apparent nausea or vomiting Anesthetic complications: no   No notable events documented.   Last Vitals:  Vitals:   09/13/23 1030 09/13/23 1112  BP: 136/73 127/74  Pulse:  70  Resp: 16 18  Temp: 36.6 C (!) 36.4 C  SpO2: 94% 98%    Last Pain:  Vitals:   09/13/23 1112  TempSrc:   PainSc: 0-No pain                 Myron Stankovich C Tyson Parkison

## 2023-09-13 NOTE — Op Note (Signed)
LOCATION:  [facility]   PREOPERATIVE DIAGNOSIS:    Nuclear sclerotic cataract right eye. H25.11   POSTOPERATIVE DIAGNOSIS:  Nuclear sclerotic cataract right eye.     PROCEDURE:  Phacoemusification with posterior chamber intraocular lens placement of the right eye   ULTRASOUND TIME: Procedure(s): CATARACT EXTRACTION PHACO AND INTRAOCULAR LENS PLACEMENT (IOC) RIGHT 5.46 00:26.9 (Right)  LENS:   Implant Name Type Inv. Item Serial No. Manufacturer Lot No. LRB No. Used Action  LENS IOL TECNIS EYHANCE 23.0 - [id number] Intraocular Lens LENS IOL TECNIS EYHANCE 23.0 [id number] [employer]  Right 1 Implanted         SURGEON:  Deirdre Evener, MD   ANESTHESIA:  Topical with tetracaine drops and 2% Xylocaine jelly, augmented with 1% preservative-free intracameral lidocaine.    COMPLICATIONS:  None.   DESCRIPTION OF PROCEDURE:  The patient was identified in the holding room and transported to the operating room and placed in the supine position under the operating microscope.  The right eye was identified as the operative eye and it was prepped and draped in the usual sterile ophthalmic fashion.   A 1 millimeter clear-corneal paracentesis was made at the 12:00 position.  0.5 ml of preservative-free 1% lidocaine was injected into the anterior chamber. The anterior chamber was filled with Viscoat viscoelastic.  A 2.4 millimeter keratome was used to make a near-clear corneal incision at the 9:00 position.  A curvilinear capsulorrhexis was made with a cystotome and capsulorrhexis forceps.  Balanced salt solution was used to hydrodissect and hydrodelineate the nucleus.   Phacoemulsification was then used in stop and chop fashion to remove the lens nucleus and epinucleus.  The remaining cortex was then removed using the irrigation and aspiration handpiece. Provisc was then placed into the capsular bag to distend it for lens placement.  A lens was then injected into the capsular bag.  The  remaining viscoelastic was aspirated.   Wounds were hydrated with balanced salt solution.  The anterior chamber was inflated to a physiologic pressure with balanced salt solution.  No wound leaks were noted. Cefuroxime 0.1 ml of a 10mg /ml solution was injected into the anterior chamber for a dose of 1 mg of intracameral antibiotic at the completion of the case.   Timolol and Brimonidine drops were applied to the eye.  The patient was taken to the recovery room in stable condition without complications of anesthesia or surgery.   Shawn Carattini 09/13/2023, 11:11 AM

## 2023-09-13 NOTE — Transfer of Care (Signed)
Immediate Anesthesia Transfer of Care Note  Patient: Katherine Snyder  Procedure(s) Performed: CATARACT EXTRACTION PHACO AND INTRAOCULAR LENS PLACEMENT (IOC) RIGHT 5.46 00:26.9 (Right)  Patient Location: PACU  Anesthesia Type: MAC  Level of Consciousness: awake, alert  and patient cooperative  Airway and Oxygen Therapy: Patient Spontanous Breathing and Patient connected to supplemental oxygen  Post-op Assessment: Post-op Vital signs reviewed, Patient's Cardiovascular Status Stable, Respiratory Function Stable, Patent Airway and No signs of Nausea or vomiting  Post-op Vital Signs: Reviewed and stable  Complications: No notable events documented.

## 2023-09-18 ENCOUNTER — Encounter: Payer: Self-pay | Admitting: Ophthalmology

## 2023-09-20 ENCOUNTER — Inpatient Hospital Stay: Payer: 59 | Attending: Obstetrics and Gynecology | Admitting: Obstetrics and Gynecology

## 2023-09-20 VITALS — BP 139/73 | HR 62 | Temp 97.6°F | Resp 19 | Wt 169.3 lb

## 2023-09-20 DIAGNOSIS — Z9221 Personal history of antineoplastic chemotherapy: Secondary | ICD-10-CM | POA: Diagnosis not present

## 2023-09-20 DIAGNOSIS — Z90722 Acquired absence of ovaries, bilateral: Secondary | ICD-10-CM | POA: Diagnosis not present

## 2023-09-20 DIAGNOSIS — Z923 Personal history of irradiation: Secondary | ICD-10-CM | POA: Diagnosis not present

## 2023-09-20 DIAGNOSIS — Z90710 Acquired absence of both cervix and uterus: Secondary | ICD-10-CM | POA: Diagnosis not present

## 2023-09-20 DIAGNOSIS — Z8542 Personal history of malignant neoplasm of other parts of uterus: Secondary | ICD-10-CM | POA: Diagnosis present

## 2023-09-20 DIAGNOSIS — R911 Solitary pulmonary nodule: Secondary | ICD-10-CM | POA: Diagnosis not present

## 2023-09-20 DIAGNOSIS — Z08 Encounter for follow-up examination after completed treatment for malignant neoplasm: Secondary | ICD-10-CM | POA: Diagnosis not present

## 2023-09-20 NOTE — Progress Notes (Signed)
Gynecologic Oncology Interval Visit   Referring Provider: Dr. Logan Bores  Chief Complaint: Endometrial Cancer  Subjective:  Katherine Snyder is a 72 y.o. 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  She is doing well without complaints.  H/o chronic leg swelling which is no worse.    She is not seeing Dr. Logan Bores or Dr. Rushie Chestnut at this time. She is following up with Dr. Cathie Hoops 03/02/2023 and was NED. CA125 was stable and are ordered by Dr. Cathie Hoops.   Component Ref Range & Units 6 mo ago (03/02/23) 1 yr ago (03/21/22) 2 yr ago (09/20/21) 2 yr ago (03/19/21) 2 yr ago (09/22/20) 3 yr ago (04/13/20) 4 yr ago (07/17/19)  Cancer Antigen (CA) 125 0.0 - 38.1 U/mL 7.3 12.9 CM 9.1 CM 9.8 CM 12.2 CM 8.9 CM 12.2 CM   She had a CT chest 01/17/2022 MPRESSION: 1. Interval resolution of previously seen new nodules in the posterior left apex.   2. New small bilateral pulmonary nodules measuring 0.6 cm and smaller. Occasional additional unchanged nodules scattered bilaterally.   3. Areas of clustered centrilobular and tree-in-bud nodularity bilaterally, as well as mild, bandlike scarring and consolidation of the medial segment right middle lobe and lingula.   4. Fluctuance of findings is consistent with a benign, infectious or inflammatory etiology, and constellation is particularly suggestive of atypical mycobacterial infection.   05/05/2023 Mammogram IMPRESSION:No mammographic evidence of malignancy. A result letter of this screening mammogram will be mailed directly to the patient. RECOMMENDATION: Screening mammogram in one year. (Code:[id number]-B-01Y) BI-RADS CATEGORY  1: Negative.  GYNECOLOGIC ONCOLOGY HISTORY:  Initially, patient presented to ER on 02/26/18 for PMB. She was reportedly 14 years post menopausal and had had a 2 day episode of  vaginal bleeding that was constant.   02/26/18- Ultrasound Pelvic Complete w/ Transvaginal: Endometrium thickness up to 33mm. No focal solid or cystic change. IMPRESSION: Mildly enlarged uterus. No discrete fibroids. Markedly thickened endometrium. In the setting of post-menopausal bleeding, endometrial sampling is indicated to exclude carcinoma. If results are benign, sonohysterogram should be considered for focal lesion work-up. (Ref: Radiological Reasoning: Algorithmic Workup of Abnormal Vaginal Bleeding with Endovaginal Sonography and Sonohysterography. AJR 2008; 409:W11-91) Nonvisualization of the right ovary. Normal-appearing left ovary. No significant uterine fibroids were observed.  She was seen by Dr. Clayburn Pert son 03/01/18 who performed vacurette endometrial biopsy. Uterus was sounded to length to 9 cm. She was started on Aygestin and bleeding stopped.   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  COMMENT: BASED ON INITIAL HISTOLOGIC FINDINGS, IMMUNOHISTOCHEMISTRY IS EVALUATED:  THE SARCOMATOUS TUMOR COMPONENT IS DIFFUSELY POSITIVE FOR CD10 AND THE CARCINOMA COMPONENT IS POSITIVE FOR PANKERATIN, SUPPORTING THE DIAGNOSIS.   She was seen by Dr. Johnnette Litter, Gyn-Onc on 03/28/2018 for initial evaluation and treatment planning.  TLH BSO with sentinel lymph node mapping and biopsies and aortic lymph node dissection were discussed.  Surgery at [facility] was recommended but unfortunately, patient's insurance will not cover.   CA 125- 10.5  03/30/18 - CT C/A/P W CONTRAST IMPRESSION: 1. Mass like expansion of the endometrial cavity known primary endometrial carcinoma. 2. No evidence for nodal metastasis or solid organ metastasis within the abdomen or pelvis. 3. Scattered nonspecific solid nodules and a single part solid nodule identified in both lungs. Consensus criteria  recommendations for  nodule followup does not apply to patients with known primary malignancy. 4. Liver cysts.  She underwent CT Imaging and then robot assisted total hysterectomy with sentinel LN mapping, biopsies, and aortic LN dissection with Dr. Sonia Side at [facility] 04/04/18.   A. UTERUS WITH CERVIX; HYSTERECTOMY:  - CARCINOSARCOMA.  - MYOMETRIAL INVASION PRESENT, LESS THAN HALF OF THE MYOMETRIUM.  - NEGATIVE FOR CERVICAL AND SEROSAL INVOLVEMENT.  - SUBMUCOSAL LEIOMYOMA.   FALLOPIAN TUBE AND OVARY, LEFT; SALPINGO-OOPHORECTOMY:  - NEGATIVE FOR MALIGNANCY.  - OVARIAN CORTICAL INCLUSION CYSTS.  - ATROPHIC FALLOPIAN TUBE.   B. SENTINEL LYMPH NODE, RIGHT PRIMARY OBTURATOR; EXCISION:  - NEGATIVE FOR MALIGNANCY, ONE LYMPH NODE (0/1).   C. LYMPH NODE, RIGHT EXTERNAL ILIAC VEIN; EXCISION:  - NEGATIVE FOR MALIGNANCY, ONE LYMPH NODE (0/1).   D. FALLOPIAN TUBE AND OVARY, UNILATERAL (RIGHT); SALPINGO-OOPHORECTOMY:  - NEGATIVE FOR MALIGNANCY.  - OVARIAN CORTICAL INCLUSION CYSTS.  - ATROPHIC FALLOPIAN TUBE.   E. SENTINEL LYMPH NODE, RIGHT LOW PARA-AORTIC; EXCISION:  - NEGATIVE FOR MALIGNANCY, TWO LYMPH NODES (0/2).   F. SENTINEL LYMPH NODE, LEFT PRIMARY LOW PARA-AORTIC; EXCISION:  - NEGATIVE FOR MALIGNANCY, ONE LYMPH NODE (0/1).   DIAGNOSIS:  A.  PELVIC WASHINGS:  - NEGATIVE FOR MALIGNANCY.    Her case was discussed at [facility] and discussed that recurrence risk without adjuvant treatment is up to 50%.  So recommend adjuvant chemo and radiation.  She was seen by Dr. Rushie Chestnut, for initial evaluation of stage IA uterine carcinosarcoma and plan is for brachytherapy after 6 cycles of carbo/taxol.    She initiated carbo-Taxol on 04/25/2018.  Completed 6 cycles on 08/08/2018.  She received vaginal brachytherapy with Dr. Rushie Chestnut 10/19-11/09/2018.   She had thyroid ultrasound for incidental thyroid nodule seen on surveillance imaging for indeterminate pulmonary nodules on 5/20. Biopsy was benign.   07/11/18- CT  Chest WO Contrast for follow-up on pulmonary nodules IMPRESSION: 1. Previously visualized pulmonary nodules are either stable or resolved. Several new subcentimeter bilateral pulmonary nodules. Pulmonary nodularity generally appears centrilobular in distribution and while considered indeterminate is more suggestive of infectious/inflammatory nodularity. Continued chest CT surveillance is recommended in 3-6 months. 2. No thoracic adenopathy.  12/10/2018- CT Chest wo contrast - New sub solid density measuring 12 mm with associated 3 mm solid nodule is noted in left upper lobe. There also noted several other new nodules noted more posteriorly in the left upper lobe, with the largest measuring 3 mm. These may simply be inflammatory in etiology, but neoplasm can not be excluded. Initial follow-up by chest CT without contrast is recommended in 3 months to confirm persistence. - Stable sub solid density is noted in right upper lobe with grossly stable 4 mm solid nodule. Multiple other pulmonary nodules are noted in both lungs which are stable compared to prior exam - Small nonobstructive right renal calculus - Stable left thyroid nodule - Stable hepatic cyst  She stopped smoking 2/20, but occasionally using vaping products.    CT scan chest 5/20 IMPRESSION: 1. Stable scattered pulmonary nodules as detailed above including a 1.2 cm part solid nodule in the right upper lobe. The solid component remains stable at approximately 0.4 cm. Follow-up non-contrast CT recommended at 3-6 months to confirm persistence. If unchanged, and solid component remains <6 mm, annual CT is recommended until 5 years of stability has been established. If persistent these nodules should be considered highly suspicious if the solid component of the nodule is 6 mm or greater in  size and enlarging. This recommendation follows the consensus statement: Guidelines for Management of Incidental Pulmonary Nodules Detected on CT Images: From  the Fleischner Society 2017; Radiology 2017; 284:228-243. 2. Additional scattered pulmonary nodules as detailed above measuring up to approximately 8 mm. These are stable from prior study. Attention on yearly follow-up examinations is recommended. 3. Bilateral nonobstructing renal nephroliths are again noted.  4. Mild bilateral interlobular septal thickening consistent with mild volume overload.  CT scan 8/21 IMPRESSION: 1. No acute intrathoracic, abdominal, or pelvic pathology. 2. No evidence of metastatic disease in the chest, abdomen, or pelvis. 3. Bilateral pulmonary nodules similar to prior CT. 4. Small nonobstructing right renal interpolar calculus. 5. Colonic diverticulosis. No bowel obstruction. 6. Aortic Atherosclerosis (ICD10-I70.0).   CT A/P 07/21/2021 1. New posterior LEFT upper lobe subpleural nodules, largest measuring up to 1.0 cm (see key image). Attention on follow-up. Additional pulmonary nodules are similar to comparison. 2. No evidence to suggest metastatic disease within the chest, abdomen or pelvis. 3. Interval port removal. Additional chronic and senescent changes, as above.      Primary care Colonoscopy on 03/13/20 with benign polyps and she will repeat in 5 years. Mammogram 03/09/21 was negative.   Problem List: Patient Active Problem List   Diagnosis Date Noted   Chemotherapy-induced peripheral neuropathy (HCC) 03/02/2023   Overweight with body mass index (BMI) of 26 to 26.9 in adult 10/05/2022   Neuropathy 05/16/2018   Fatigue 05/16/2018   Inflammatory heel pain, right 05/16/2018   Encounter for smoking cessation counseling 05/16/2018   Drug-induced neutropenia (HCC) 05/09/2018   Goals of care, counseling/discussion 04/18/2018   Endometrial cancer (HCC)    Seborrhea 05/13/2016   History of kidney stones 06/26/2015   OP (osteoporosis) 06/26/2015   Plantar fasciitis 06/26/2015   Current tobacco use 06/26/2015   Kidney stone 06/21/2013   Chronic  airway obstruction (HCC) 10/08/2009   Psoriasis 09/30/2008   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   Menopausal symptom 09/21/2007   Tobacco use 09/21/2007   Awareness of heartbeats 09/21/2007    Past Medical History: Past Medical History:  Diagnosis Date   Arthritis    Cancer (HCC) 2019   endometrial   Endometrial mass    History of chicken pox    History of kidney stones    History of measles    History of mumps    Osteoporosis    Personal history of chemotherapy    Personal history of radiation therapy    PMB (postmenopausal bleeding)    Psoriasis    Wears dentures    full upper    Past Surgical History: Past Surgical History:  Procedure Laterality Date   APPENDECTOMY  1960   BREAST EXCISIONAL BIOPSY Left 1980   neg   BREAST SURGERY Left    biopsy   CATARACT EXTRACTION W/PHACO Left 08/30/2023   Procedure: CATARACT EXTRACTION PHACO AND INTRAOCULAR LENS PLACEMENT (IOC) LEFT;  Surgeon: Lockie Mola, MD;  Location: [facility];  Service: Ophthalmology;  Laterality: Left;  6.13 0:39.9   CATARACT EXTRACTION W/PHACO Right 09/13/2023   Procedure: CATARACT EXTRACTION PHACO AND INTRAOCULAR LENS PLACEMENT (IOC) RIGHT 5.46 00:26.9;  Surgeon: Lockie Mola, MD;  Location: [facility];  Service: Ophthalmology;  Laterality: Right;   COLONOSCOPY WITH PROPOFOL N/A 03/13/2020   Procedure: COLONOSCOPY WITH PROPOFOL;  Surgeon: Wyline Mood, MD;  Location: [facility] ENDOSCOPY;  Service: Gastroenterology;  Laterality: N/A;   Injection varicose veins in legs Bilateral 2010   Dr. Earnestine Leys   LITHOTRIPSY  1994, 2004   for renal stones   PORTA CATH INSERTION N/A 04/24/2018   Procedure: PORTA CATH INSERTION;  Surgeon: Renford Dills, MD;  Location: [facility] INVASIVE CV LAB;  Service: Cardiovascular;  Laterality: N/A;   PORTA CATH REMOVAL N/A 01/18/2021   Procedure: PORTA CATH REMOVAL;  Surgeon: Annice Needy, MD;  Location: [facility] INVASIVE CV LAB;  Service:  Cardiovascular;  Laterality: N/A;   ROBOTIC ASSISTED TOTAL HYSTERECTOMY Bilateral 04/04/2018   Procedure: ROBOTIC ASSISTED TOTAL HYSTERECTOMY,SENTINEL LYMPH NODE MAPPING AND BIOPSIES,AORTIC LYMPH NODE DISSECTION;  Surgeon: Artelia Laroche, MD;  Location: [facility] ORS;  Service: Gynecology;  Laterality: Bilateral;   TUBAL LIGATION  1980    OB History:  OB History  Gravida Para Term Preterm AB Living  1 1 1     1   SAB IAB Ectopic Multiple Live Births          1    # Outcome Date GA Lbr Len/2nd Weight Sex Type Anes PTL Lv  1 Term 62    M Vag-Spont   LIV  [id number] LMP:   Family History: Family History  Problem Relation Age of Onset   Breast cancer Mother    Transient ischemic attack Mother    Arthritis Mother    Hypothyroidism Mother    Lung cancer Mother    Diabetes Father        type 2   Hypertension Father    Arthritis Father    Colon cancer Sister    Prostate cancer Brother    Hyperlipidemia Son    Stomach cancer Maternal Grandmother    Stroke Maternal Grandfather     Social History: Social History   Socioeconomic History   Marital status: Divorced    Spouse name: single   Number of children: 1   Years of education: Not on file   Highest education level: 10th grade  Occupational History   Occupation: retired  Tobacco Use   Smoking status: Every Day    Current packs/day: 0.50    Average packs/day: 0.5 packs/day for 30.9 years (15.4 ttl pk-yrs)    Types: Cigarettes    Start date: 1994   Smokeless tobacco: Never  Vaping Use   Vaping status: Former  Substance and Sexual Activity   Alcohol use: No    Alcohol/week: 0.0 standard drinks of alcohol   Drug use: No   Sexual activity: Not Currently  Other Topics Concern   Not on file  Social History Narrative   Not on file   Social Determinants of Health   Financial Resource Strain: Low Risk  (12/19/2022)   Overall Financial Resource Strain (CARDIA)    Difficulty of Paying Living Expenses: Not very hard   Food Insecurity: No Food Insecurity (12/19/2022)   Hunger Vital Sign    Worried About Running Out of Food in the Last Year: Never true    Ran Out of Food in the Last Year: Never true  Transportation Needs: No Transportation Needs (12/19/2022)   PRAPARE - Administrator, Civil Service (Medical): No    Lack of Transportation (Non-Medical): No  Physical Activity: Inactive (12/19/2022)   Exercise Vital Sign    Days of Exercise per Week: 0 days    Minutes of Exercise per Session: 0 min  Stress: Stress Concern Present (12/19/2022)   [employer] of Occupational Health - Occupational Stress Questionnaire    Feeling of Stress : To some extent  Social Connections: Moderately Integrated (12/19/2022)   Social Connection and  Isolation Panel [NHANES]    Frequency of Communication with Friends and Family: More than three times a week    Frequency of Social Gatherings with Friends and Family: More than three times a week    Attends Religious Services: 1 to 4 times per year    Active Member of [employer] or Organizations: Yes    Attends Banker Meetings: Never    Marital Status: Divorced  Catering manager Violence: Not At Risk (12/19/2022)   Humiliation, Afraid, Rape, and Kick questionnaire    Fear of Current or Ex-Partner: No    Emotionally Abused: No    Physically Abused: No    Sexually Abused: No    Allergies: No Known Allergies  Current Medications: Current Outpatient Medications  Medication Sig Dispense Refill   DULoxetine (CYMBALTA) 30 MG capsule Take 1 capsule (30 mg total) by mouth daily. 90 capsule 1   FLUZONE HIGH-DOSE QUADRIVALENT 0.7 ML SUSY      Multiple Vitamins-Minerals (CENTRUM SILVER ADULT 50+ PO) Take 2 tablets by mouth daily.     naproxen sodium (ALEVE) 220 MG tablet Take 220 mg by mouth daily as needed.     triamcinolone (KENALOG) 0.025 % cream Apply 1 application. topically daily as needed (for areas on face. avoid eyes). 80 g 1   loratadine  (CLARITIN) 10 MG tablet Take 1 tablet (10 mg total) by mouth daily. (Patient not taking: Reported on 08/23/2023) 30 tablet 11   No current facility-administered medications for this visit.   Review of Systems General: no complaints  HEENT: no complaints  Lungs: no complaints  Cardiac: no complaints  GI: no complaints  GU: no complaints  Musculoskeletal: no complaints  Extremities: no complaints  Skin: no complaints  Neuro: no complaints  Endocrine: no complaints  Psych: no complaints       Objective:  Physical Examination:  Vitals:   09/20/23 0859  BP: 139/73  Pulse: 62  Resp: 19  Temp: 97.6 F (36.4 C)     ECOG Performance Status: 0  GENERAL: Patient is a well appearing female in no acute distress HEENT:  PERRL, neck supple with midline trachea. Thyroid without masses.  NODES:  No cervical, supraclavicular, axillary, or inguinal lymphadenopathy palpated.  LUNGS:  Normal respiratory effort ABDOMEN:  Soft, nontender. Nondistended. No ascites or masses.  MSK:  No focal spinal tenderness to palpation. EXTREMITIES:  Stable peripheral edema. R>L SKIN:  Clear with no obvious rashes or skin changes. No nail dyscrasia. NEURO:  Nonfocal. Well oriented.  Appropriate affect.  Pelvic: chaperoned by CMA EGBUS: no lesions Cervix: surgically absent Vagina: no lesions, no discharge or bleeding Uterus: surgically absent Adnexa: no palpable masses    Radiology Per HPI and interval history    Assessment:  Katherine Snyder is a 72 y.o. female diagnosed with stage IA uterine carcinosarcoma with RA TLH/BSO, negative staging 5/19 followed by vaginal brachytherapy and 6 cycles of carbo/taxol chemotherapy completed 08/08/2018.  NED on exam today.    Chest CTs have  shown indeterminate lung nodules, overall stable but a new nodule noted on last CT. She is a current smoker.   Hepatic cyst, stable on CT.   H/o nonobstructing kidney stones on CT   Comorbidities complicating care:  smoker, plantar fasciitis  Plan:   Problem List Items Addressed This Visit   None Visit Diagnoses     Encounter for follow-up surveillance of endometrial cancer    -  Primary   Lung nodule  Previously reviewed NCCN guidelines for surveillance including physical exam every 3-6 months for 2- 3 years then every 6 months for years 4-5 then annually thereafter.  We will alternate with Dr. Cathie Hoops who will see her in 6 months and we will see her back in 12 months.   Repeat Chest CT  She agrees with this plan.   The patient's diagnosis, an outline of the further diagnostic and laboratory studies which will be required, the recommendation, and alternatives were discussed.  All questions were answered to the patient's satisfaction.  I personally had a face to face interaction and evaluated the patient. I have reviewed her history and available records and have performed the physical exam.  I have discussed the case with the patient.   Kissie Ziolkowski Leta Jungling, MD

## 2023-09-22 ENCOUNTER — Ambulatory Visit: Payer: 59

## 2023-09-27 ENCOUNTER — Ambulatory Visit
Admission: RE | Admit: 2023-09-27 | Discharge: 2023-09-27 | Disposition: A | Discharge status: Home / Self Care | Payer: 59 | Source: Ambulatory Visit | Attending: Obstetrics and Gynecology | Admitting: Obstetrics and Gynecology

## 2023-09-27 DIAGNOSIS — Z8542 Personal history of malignant neoplasm of other parts of uterus: Secondary | ICD-10-CM | POA: Insufficient documentation

## 2023-09-27 DIAGNOSIS — R911 Solitary pulmonary nodule: Secondary | ICD-10-CM | POA: Diagnosis present

## 2023-09-27 DIAGNOSIS — Z08 Encounter for follow-up examination after completed treatment for malignant neoplasm: Secondary | ICD-10-CM | POA: Diagnosis present

## 2023-10-23 ENCOUNTER — Other Ambulatory Visit: Payer: Self-pay | Admitting: Oncology

## 2023-11-21 ENCOUNTER — Telehealth: Payer: Self-pay

## 2023-11-21 NOTE — Telephone Encounter (Signed)
 Attempted to call Katherine Snyder results of Chest CT and recommendation for CT lung screening program. No answer. No voicemail.

## 2023-12-13 ENCOUNTER — Encounter: Payer: Self-pay | Admitting: Oncology

## 2023-12-20 ENCOUNTER — Encounter: Payer: Self-pay | Admitting: Oncology

## 2023-12-20 ENCOUNTER — Ambulatory Visit: Payer: 59 | Admitting: Emergency Medicine

## 2023-12-20 VITALS — Ht 67.0 in | Wt 175.0 lb

## 2023-12-20 DIAGNOSIS — Z0000 Encounter for general adult medical examination without abnormal findings: Secondary | ICD-10-CM | POA: Diagnosis not present

## 2023-12-20 DIAGNOSIS — Z780 Asymptomatic menopausal state: Secondary | ICD-10-CM | POA: Diagnosis not present

## 2023-12-20 DIAGNOSIS — Z1231 Encounter for screening mammogram for malignant neoplasm of breast: Secondary | ICD-10-CM

## 2023-12-20 NOTE — Progress Notes (Signed)
Subjective:   Katherine Snyder is a 73 y.o. female who presents for Medicare Annual (Subsequent) preventive examination.  This patient declined Interactive audio and Acupuncturist. Therefore the visit was completed with audio only.   Visit Complete: Virtual I connected with  Katherine Snyder on 12/20/23 by a audio enabled telemedicine application and verified that I am speaking with the correct person using two identifiers.  Patient Location: Home  Provider Location: Home Office  I discussed the limitations of evaluation and management by telemedicine. The patient expressed understanding and agreed to proceed.  Vital Signs: Because this visit was a virtual/telehealth visit, some criteria may be missing or patient reported. Any vitals not documented were not able to be obtained and vitals that have been documented are patient reported.   Cardiac Risk Factors include: advanced age (>12men, >50 women);smoking/ tobacco exposure     Objective:    Today's Vitals   12/20/23 1042  Weight: 175 lb (79.4 kg)  Height: 5\' 7"  (1.702 m)   Body mass index is 27.41 kg/m.     12/20/2023   10:51 AM 09/20/2023    8:59 AM 09/13/2023   10:24 AM 08/30/2023    7:00 AM 03/02/2023    1:54 PM 12/19/2022   10:31 AM 08/31/2022    1:21 PM  Advanced Directives  Does Patient Have a Medical Advance Directive? No No No No No No No  Would patient like information on creating a medical advance directive? Yes (MAU/Ambulatory/Procedural Areas - Information given) No - Patient declined Yes (MAU/Ambulatory/Procedural Areas - Information given) No - Patient declined No - Patient declined Yes (ED - Information included in AVS) No - Patient declined    Current Medications (verified) Outpatient Encounter Medications as of 12/20/2023  Medication Sig   DULoxetine (CYMBALTA) 30 MG capsule TAKE 1 CAPSULE BY MOUTH EVERY DAY   loratadine (CLARITIN) 10 MG tablet Take 1 tablet (10 mg total) by mouth daily.    Multiple Vitamins-Minerals (CENTRUM SILVER ADULT 50+ PO) Take 2 tablets by mouth daily.   naproxen sodium (ALEVE) 220 MG tablet Take 220 mg by mouth daily as needed.   triamcinolone (KENALOG) 0.025 % cream Apply 1 application. topically daily as needed (for areas on face. avoid eyes).   FLUZONE HIGH-DOSE QUADRIVALENT 0.7 ML SUSY  (Patient not taking: Reported on 12/20/2023)   No facility-administered encounter medications on file as of 12/20/2023.    Allergies (verified) Patient has no known allergies.   History: Past Medical History:  Diagnosis Date   Arthritis    Cancer (HCC) 2019   endometrial   Endometrial mass    History of chicken pox    History of kidney stones    History of measles    History of mumps    Osteoporosis    Personal history of chemotherapy    Personal history of radiation therapy    PMB (postmenopausal bleeding)    Psoriasis    Wears dentures    full upper   Past Surgical History:  Procedure Laterality Date   APPENDECTOMY  1960   BREAST EXCISIONAL BIOPSY Left 1980   neg   BREAST SURGERY Left    biopsy   CATARACT EXTRACTION W/PHACO Left 08/30/2023   Procedure: CATARACT EXTRACTION PHACO AND INTRAOCULAR LENS PLACEMENT (IOC) LEFT;  Surgeon: Lockie Mola, MD;  Location: [facility];  Service: Ophthalmology;  Laterality: Left;  6.13 0:39.9   CATARACT EXTRACTION W/PHACO Right 09/13/2023   Procedure: CATARACT EXTRACTION PHACO AND INTRAOCULAR LENS PLACEMENT (  IOC) RIGHT 5.46 00:26.9;  Surgeon: Lockie Mola, MD;  Location: [facility];  Service: Ophthalmology;  Laterality: Right;   COLONOSCOPY WITH PROPOFOL N/A 03/13/2020   Procedure: COLONOSCOPY WITH PROPOFOL;  Surgeon: Wyline Mood, MD;  Location: [facility];  Service: Gastroenterology;  Laterality: N/A;   Injection varicose veins in legs Bilateral 2010   Dr. Earnestine Leys   LITHOTRIPSY  1994, 2004   for renal stones   PORTA CATH INSERTION N/A 04/24/2018   Procedure: PORTA CATH  INSERTION;  Surgeon: Renford Dills, MD;  Location: [facility] INVASIVE CV LAB;  Service: Cardiovascular;  Laterality: N/A;   PORTA CATH REMOVAL N/A 01/18/2021   Procedure: PORTA CATH REMOVAL;  Surgeon: Annice Needy, MD;  Location: [facility] INVAS[facility] CV LAB;  Service: Cardiovascular;  Laterality: N/A;   ROBOTIC ASSISTED TOTAL HYSTERECTOMY Bilateral 04/04/2018   Procedure: ROBOTIC ASSISTED TOTAL HYSTERECTOMY,SENTINEL LYMPH NODE MAPPING AND BIOPSIES,AORTIC LYMPH NODE DISSECTION;  Surgeon: Artelia Laroche, MD;  Location: [facility] ORS;  Service: Gynecology;  Laterality: Bilateral;   TUBAL LIGATION  1980   Family History  Problem Relation Age of Onset   Breast cancer Mother    Transient ischemic attack Mother    Arthritis Mother    Hypothyroidism Mother    Lung cancer Mother    Diabetes Father        type 2   Hypertension Father    Arthritis Father    Colon cancer Sister    Prostate cancer Brother    Hyperlipidemia Son    Stomach cancer Maternal Grandmother    Stroke Maternal Grandfather    Social History   Socioeconomic History   Marital status: Divorced    Spouse name: single   Number of children: 1   Years of education: Not on file   Highest education level: 10th grade  Occupational History   Occupation: retired  Tobacco Use   Smoking status: Every Day    Current packs/day: 0.50    Average packs/day: 0.5 packs/day for 31.1 years (15.6 ttl pk-yrs)    Types: Cigarettes    Start date: 1994   Smokeless tobacco: Never  Vaping Use   Vaping status: Former  Substance and Sexual Activity   Alcohol use: No    Alcohol/week: 0.0 standard drinks of alcohol   Drug use: No   Sexual activity: Not Currently  Other Topics Concern   Not on file  Social History Narrative   Not on file   Social Drivers of Health   Financial Resource Strain: Low Risk  (12/20/2023)   Overall Financial Resource Strain (CARDIA)    Difficulty of Paying Living Expenses: Not hard at all  Food Insecurity: No  Food Insecurity (12/20/2023)   Hunger Vital Sign    Worried About Running Out of Food in the Last Year: Never true    Ran Out of Food in the Last Year: Never true  Transportation Needs: No Transportation Needs (12/20/2023)   PRAPARE - Administrator, Civil Service (Medical): No    Lack of Transportation (Non-Medical): No  Physical Activity: Inactive (12/20/2023)   Exercise Vital Sign    Days of Exercise per Week: 0 days    Minutes of Exercise per Session: 0 min  Stress: No Stress Concern Present (12/20/2023)   [employer] of Occupational Health - Occupational Stress Questionnaire    Feeling of Stress : Not at all  Social Connections: Moderately Isolated (12/20/2023)   Social Connection and Isolation Panel [NHANES]    Frequency of Communication  with Friends and Family: Once a week    Frequency of Social Gatherings with Friends and Family: More than three times a week    Attends Religious Services: More than 4 times per year    Active Member of [employer] or Organizations: No    Attends Engineer, structural: Never    Marital Status: Divorced    Tobacco Counseling Ready to quit: Not Answered Counseling given: Not Answered   Clinical Intake:  Pre-visit preparation completed: Yes  Pain : No/denies pain     BMI - recorded: 27.41 Nutritional Status: BMI 25 -29 Overweight Nutritional Risks: None Diabetes: No  How often do you need to have someone help you when you read instructions, pamphlets, or other written materials from your doctor or pharmacy?: 3 - Sometimes  Interpreter Needed?: No  Information entered by :: Tora Kindred, CMA   Activities of Daily Living    12/20/2023   10:44 AM 09/13/2023   10:23 AM  In your present state of health, do you have any difficulty performing the following activities:  Hearing? 0 0  Vision? 0 0  Difficulty concentrating or making decisions? 0 0  Walking or climbing stairs? 0   Dressing or bathing? 0   Doing  errands, shopping? 0   Preparing Food and eating ? N   Using the Toilet? N   In the past six months, have you accidently leaked urine? N   Do you have problems with loss of bowel control? N   Managing your Medications? N   Managing your Finances? N   Housekeeping or managing your Housekeeping? N     Patient Care Team: Sherlyn Hay, DO as PCP - General (Family Medicine) Leida Lauth, MD as Referring Physician (Obstetrics and Gynecology) Benita Gutter, RN as Oncology Nurse Navigator Carmina Miller, MD as Referring Physician (Radiation Oncology) Rickard Patience, MD as Consulting Physician (Oncology) [facility] (Optometry)  Indicate any recent Medical Services you may have received from other than [facility] providers in the past year (date may be approximate).     Assessment:   This is a routine wellness examination for [place].  Hearing/Vision screen Hearing Screening - Comments:: Denies hearing loss Vision Screening - Comments:: Gets eye exams [facility]   Goals Addressed             This Visit's Progress    Exercise 3x per week (30 min per time)        Depression Screen    12/20/2023   10:49 AM 04/05/2023   10:42 AM 12/19/2022   10:27 AM 10/05/2022   11:16 AM 03/24/2022   11:12 AM 12/15/2021   11:14 AM 03/02/2021   10:42 AM  PHQ 2/9 Scores  PHQ - 2 Score 0 0 0 0 0 0 0  PHQ- 9 Score  0   0  0    Fall Risk    12/20/2023   10:53 AM 04/05/2023   10:42 AM 12/19/2022   10:39 AM 10/05/2022   11:16 AM 03/24/2022   11:12 AM  Fall Risk   Falls in the past year? 0 0 0 0 0  Number falls in past yr: 0 0 0  0  Injury with Fall? 0 0 0 0 0  Risk for fall due to : No Fall Risks No Fall Risks No Fall Risks  No Fall Risks  Follow up Falls prevention discussed;Falls evaluation completed Falls evaluation completed Education provided;Falls prevention discussed  MEDICARE RISK AT HOME: Medicare Risk at Home Any stairs in or around the home?: Yes If so, are there any  without handrails?: Yes Home free of loose throw rugs in walkways, pet beds, electrical cords, etc?: Yes Adequate lighting in your home to reduce risk of falls?: Yes Life alert?: No Use of a cane, walker or w/c?: No Grab bars in the bathroom?: Yes Shower chair or bench in shower?: Yes Elevated toilet seat or a handicapped toilet?: Yes  TIMED UP AND GO:  Was the test performed?  No    Cognitive Function:        12/20/2023   10:54 AM 12/19/2022   10:34 AM 12/09/2020   10:34 AM  6CIT Screen  What Year? 0 points 0 points 0 points  What month? 0 points 0 points 0 points  What time? 0 points 0 points 0 points  Count back from 20 0 points 0 points 0 points  Months in reverse 0 points 0 points 0 points  Repeat phrase 0 points 0 points 0 points  Total Score 0 points 0 points 0 points    Immunizations Immunization History  Administered Date(s) Administered   Fluad Trivalent(High Dose 65+) 07/18/2023   Influenza, High Dose Seasonal PF 08/03/2019, 08/07/2020   Influenza-Unspecified 08/16/2016   Moderna Sars-Covid-2 Vaccination 07/20/2020, 08/17/2020, 02/10/2021   Pneumococcal Conjugate-13 01/28/2019   Pneumococcal Polysaccharide-23 10/08/2009, 12/09/2020   Td 01/28/2019   Tdap 10/08/2009    TDAP status: Up to date  Flu Vaccine status: Up to date  Pneumococcal vaccine status: Up to date  Covid-19 vaccine status: Information provided on how to obtain vaccines.   Qualifies for Shingles Vaccine? Yes   Zostavax completed No   Shingrix Completed?: No.    Education has been provided regarding the importance of this vaccine. Patient has been advised to call insurance company to determine out of pocket expense if they have not yet received this vaccine. Advised may also receive vaccine at local pharmacy or Health Dept. Verbalized acceptance and understanding.  Screening Tests Health Maintenance  Topic Date Due   Zoster Vaccines- Shingrix (1 of 2) Never done   COVID-19 Vaccine (4 -  2024-25 season) 07/09/2023   DEXA SCAN  02/02/2024   MAMMOGRAM  05/04/2024   Medicare Annual Wellness (AWV)  12/19/2024   Colonoscopy  03/13/2025   DTaP/Tdap/Td (3 - Td or Tdap) 01/27/2029   Pneumonia Vaccine 47+ Years old  Completed   INFLUENZA VACCINE  Completed   Hepatitis C Screening  Completed   HPV VACCINES  Aged Out   Lung Cancer Screening  Discontinued    Health Maintenance  Health Maintenance Due  Topic Date Due   Zoster Vaccines- Shingrix (1 of 2) Never done   COVID-19 Vaccine (4 - 2024-25 season) 07/09/2023    Colorectal cancer screening: Type of screening: Colonoscopy. Completed 03/13/20. Repeat every 5 years  Mammogram status: Ordered 12/20/23. Pt provided with contact info and advised to call to schedule appt.   Bone Density status: Ordered 12/20/23. Pt provided with contact info and advised to call to schedule appt.  Lung Cancer Screening: (Low Dose CT Chest recommended if Age 70-80 years, 20 pack-year currently smoking OR have quit w/in 15years.) does not qualify.   Lung Cancer Screening Referral: n/a  Additional Screening:  Hepatitis C Screening: does not qualify; Completed 11/05/12  Vision Screening: Recommended annual ophthalmology exams for early detection of glaucoma and other disorders of the eye.  Dental Screening: Recommended annual dental exams for proper  oral hygiene   Community Resource Referral / Chronic Care Management: CRR required this visit?  No   CCM required this visit?  No     Plan:     I have personally reviewed and noted the following in the patient's chart:   Medical and social history Use of alcohol, tobacco or illicit drugs  Current medications and supplements including opioid prescriptions. Patient is not currently taking opioid prescriptions. Functional ability and status Nutritional status Physical activity Advanced directives List of other physicians Hospitalizations, surgeries, and ER visits in previous 12  months Vitals Screenings to include cognitive, depression, and falls Referrals and appointments  In addition, I have reviewed and discussed with patient certain preventive protocols, quality metrics, and best practice recommendations. A written personalized care plan for preventive services as well as general preventive health recommendations were provided to patient.     Tora Kindred, CMA   12/20/2023   After Visit Summary: (Mail) Due to this being a telephonic visit, the after visit summary with patients personalized plan was offered to patient via mail   Nurse Notes:  Needs covid and shingles vaccines Placed order for MMG due 05/04/24 and DEXA due 02/02/24. Patient will wait until June and schedule both at the same time. Scheduled appointment for yearly physical for 04/08/24.

## 2023-12-20 NOTE — Patient Instructions (Addendum)
Ms. Hietala , Thank you for taking time to come for your Medicare Wellness Visit. I appreciate your ongoing commitment to your health goals. Please review the following plan we discussed and let me know if I can assist you in the future.   Referrals/Orders/Follow-Ups/Clinician Recommendations: Get the covid and shingles vaccines at your convenience. I have placed an order for a mammogram due 05/04/24 and a bone density due 02/02/24. You can wait until June to have your bone density test so that you will be able to do them the same day. Call [facility] @ [phone] to schedule. I have scheduled you an appointment for your yearly physical with Dr. Payton Mccallum on 04/08/24 @ 9:00 am.  This is a list of the screening recommended for you and due dates:  Health Maintenance  Topic Date Due   Zoster (Shingles) Vaccine (1 of 2) Never done   COVID-19 Vaccine (4 - 2024-25 season) 07/09/2023   DEXA scan (bone density measurement)  02/02/2024   Mammogram  05/04/2024   Medicare Annual Wellness Visit  12/19/2024   Colon Cancer Screening  03/13/2025   DTaP/Tdap/Td vaccine (3 - Td or Tdap) 01/27/2029   Pneumonia Vaccine  Completed   Flu Shot  Completed   Hepatitis C Screening  Completed   HPV Vaccine  Aged Out   Screening for Lung Cancer  Discontinued    Advanced directives: (ACP Link)Information on Advanced Care Planning can be found at [facility] [place] Advance Health Care Directives Advance Health Care Directives ([email or web address]   Next Medicare Annual Wellness Visit scheduled for next year: Yes, 12/25/24 @ 10:50 am (phone visit)

## 2024-03-01 ENCOUNTER — Inpatient Hospital Stay (HOSPITAL_BASED_OUTPATIENT_CLINIC_OR_DEPARTMENT_OTHER): Payer: 59 | Admitting: Oncology

## 2024-03-01 ENCOUNTER — Encounter: Payer: Self-pay | Admitting: Oncology

## 2024-03-01 ENCOUNTER — Inpatient Hospital Stay: Payer: 59 | Attending: Oncology

## 2024-03-01 VITALS — BP 114/72 | HR 76 | Temp 97.4°F | Resp 18 | Wt 167.1 lb

## 2024-03-01 DIAGNOSIS — C541 Malignant neoplasm of endometrium: Secondary | ICD-10-CM | POA: Diagnosis present

## 2024-03-01 DIAGNOSIS — F17210 Nicotine dependence, cigarettes, uncomplicated: Secondary | ICD-10-CM | POA: Diagnosis not present

## 2024-03-01 DIAGNOSIS — Z8042 Family history of malignant neoplasm of prostate: Secondary | ICD-10-CM | POA: Diagnosis not present

## 2024-03-01 DIAGNOSIS — Z803 Family history of malignant neoplasm of breast: Secondary | ICD-10-CM | POA: Insufficient documentation

## 2024-03-01 DIAGNOSIS — G620 Drug-induced polyneuropathy: Secondary | ICD-10-CM | POA: Insufficient documentation

## 2024-03-01 DIAGNOSIS — Z79899 Other long term (current) drug therapy: Secondary | ICD-10-CM | POA: Diagnosis not present

## 2024-03-01 DIAGNOSIS — Z801 Family history of malignant neoplasm of trachea, bronchus and lung: Secondary | ICD-10-CM | POA: Insufficient documentation

## 2024-03-01 DIAGNOSIS — R918 Other nonspecific abnormal finding of lung field: Secondary | ICD-10-CM | POA: Diagnosis not present

## 2024-03-01 DIAGNOSIS — Z800 Family history of malignant neoplasm of digestive organs: Secondary | ICD-10-CM | POA: Insufficient documentation

## 2024-03-01 DIAGNOSIS — T451X5A Adverse effect of antineoplastic and immunosuppressive drugs, initial encounter: Secondary | ICD-10-CM | POA: Diagnosis not present

## 2024-03-01 DIAGNOSIS — Z720 Tobacco use: Secondary | ICD-10-CM

## 2024-03-01 LAB — CBC WITH DIFFERENTIAL (CANCER CENTER ONLY)
Abs Immature Granulocytes: 0.03 10*3/uL (ref 0.00–0.07)
Basophils Absolute: 0.1 10*3/uL (ref 0.0–0.1)
Basophils Relative: 1 %
Eosinophils Absolute: 0.2 10*3/uL (ref 0.0–0.5)
Eosinophils Relative: 3 %
HCT: 42.8 % (ref 36.0–46.0)
Hemoglobin: 14.2 g/dL (ref 12.0–15.0)
Immature Granulocytes: 0 %
Lymphocytes Relative: 29 %
Lymphs Abs: 2.7 10*3/uL (ref 0.7–4.0)
MCH: 30.1 pg (ref 26.0–34.0)
MCHC: 33.2 g/dL (ref 30.0–36.0)
MCV: 90.7 fL (ref 80.0–100.0)
Monocytes Absolute: 0.6 10*3/uL (ref 0.1–1.0)
Monocytes Relative: 6 %
Neutro Abs: 5.7 10*3/uL (ref 1.7–7.7)
Neutrophils Relative %: 61 %
Platelet Count: 247 10*3/uL (ref 150–400)
RBC: 4.72 MIL/uL (ref 3.87–5.11)
RDW: 12.9 % (ref 11.5–15.5)
WBC Count: 9.3 10*3/uL (ref 4.0–10.5)
nRBC: 0 % (ref 0.0–0.2)

## 2024-03-01 LAB — CMP (CANCER CENTER ONLY)
ALT: 15 U/L (ref 0–44)
AST: 22 U/L (ref 15–41)
Albumin: 3.9 g/dL (ref 3.5–5.0)
Alkaline Phosphatase: 62 U/L (ref 38–126)
Anion gap: 10 (ref 5–15)
BUN: 19 mg/dL (ref 8–23)
CO2: 21 mmol/L — ABNORMAL LOW (ref 22–32)
Calcium: 9 mg/dL (ref 8.9–10.3)
Chloride: 106 mmol/L (ref 98–111)
Creatinine: 0.87 mg/dL (ref 0.44–1.00)
GFR, Estimated: >60 mL/min (ref >60)
Glucose, Bld: 121 mg/dL — ABNORMAL HIGH (ref 70–99)
Potassium: 3.6 mmol/L (ref 3.5–5.1)
Sodium: 137 mmol/L (ref 135–145)
Total Bilirubin: 0.8 mg/dL (ref 0.0–1.2)
Total Protein: 7.3 g/dL (ref 6.5–8.1)

## 2024-03-01 NOTE — Progress Notes (Signed)
 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 Clinically patient is doing very well Labs reviewed and discussed with patient. CA125 has been monitored and has been stable.  Today's levels are pending She is more than 5 year after finished treatment.  Follow up with oncology PRN.  Patient will see gynecology oncology annually.   Chemotherapy-induced peripheral neuropathy (HCC) Stable symptoms continue Cymbalta  30 mg daily.   She will ask PCP for refills in the future.   Tobacco use Recommend annual lung cancer screening.  Refer to lung cancer screening program.  Recommend smoking cessation.   Orders Placed This Encounter  Procedures   Ambulatory Referral for Lung Cancer Screening    Referral Priority:   Routine    Referral Type:   Consultation    Referral Reason:   Specialty Services Required    Number of Visits Requested:   1   Follow-up PRN All questions were answered. The patient knows to call the clinic with any problems, questions or concerns.  Timmy Forbes, MD, PhD [facility] Hematology Oncology 03/01/2024   HISTORY OF PRESENTING ILLNESS:  Katherine Snyder is a  73 y.o.  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 Pathology showed  A uterus with cervix hysterectomy -Carcinosarcoma B sentinel lymph node, right primary obturator negative C lymph node right external iliac lymph negative D fallopian tube and ovary, unilateral right negative E sentinel lymph node right low periaortic: Negative F sentinel lymph node left low  periaortic negative  CANCER CASE SUMMARY: ENDOMETRIUM  Procedure: Total hysterectomy and bilateral salpingo-oophorectomy  Histologic Type: Carcinosarcoma  Histologic Grade: Not applicable  Myometrial Invasion: Present       Depth of myometrial invasion cannot be determined due to exophytic  tumor growth and effacement of endometrial/myometrial junction       Myometrial thickness: At least 8 mm       Percentage depth of myometrial invasion: Estimated less than 50%  Uterine Serosa Involvement: Not identified  Cervical Stromal Involvement: Not identified  Other Tissue/Organ Involvement: Not identified  Peritoneal/ Ascitic Fluid: Negative for malignancy  Lymphovascular Invasion: Present  Regional Lymph Nodes: All lymph nodes negative for tumor cells  Number of Lymph Nodes Examined: 5       Total number of pelvic nodes examined: 2       Number of pelvic sentinel nodes examined: 1       Total number of para-aortic nodes examined: 3       Number of para-aortic sentinel nodes examined: 3   03/01/2018 Pathologic Stage Classification (pTNM, AJCC 8th Edition) (Note J): pT1a  pN0/FIGO IA   Stage I uterous carcinosarcoma pT1a  pN0/FIGO IA, s/p robotic assisted total hysterectomy and pelvic node sampling,  s/p 6 cycles of adjuvant carboplatin  AUC 5 and Taxol  135 mg/m prescription- finished 08/08/2018  Status post adjuvant vaginal brachi therapy.   Pertinent oncology history Katherine Snyder is a 73 y.o. female who has above history reviewed by me today presents for assessment prior to Cycle 6 adjuvant chemotherapy management for stage Ia endometrial carcinosarcoma. During cycle 1 treatment, reported feeling burning  down and having really bad heartburn.  Taxol  was temporarily stopped.  The patient was given Benadryl , Solu-Medrol , Pepcid , symptoms improved and resolved.  Taxol  was restarted and patient is able to finish her treatment. Patient reports feeling extremely tired for 2 days after chemotherapy.   She was seen by nurse practitioner Howell Macintosh during interval.   She complained burning sensation with urination and had UA and urine culture obtained.  UA was not convincing for UTI urine culture showed less than 10,000 colonies of insignificant growth.  Patient remains afebrile. Patient also reports her chronic fasciitis of foot has got a lot worse since the start of chemotherapy.  Possible neuropathy was discussed and the patient is reluctant to start gabapentin  or Cymbalta .  She was given tramadol  100 mg twice daily.  Patient reports symptoms gradually improved.    Patient was found to have neutropenia so she received Neupogen  x 2. She reports bone pain started after Neupogen  shots.   Aaron Aas#Lung nodules # 12/10/2018- CT Chest wo contrast - New sub solid density measuring 12 mm with associated 3 mm solid nodule is noted in left upper lobe.Stable sub solid density is noted in right upper lobe with grossly stable 4 mm solid nodule. Multiple other pulmonary nodules are noted in both lungs which are stable compared to prior exam Case was discussed on tumor board and recommend surveillance CT in 3 months.   new lung nodules have resolved on 10/14/2019  All other nodules are stable.   # #Left thyroid  nodule,    S/p thyroid  nodule FNA.- pathology is benign.   INTERVAL HISTORY Katherine Snyder is a 73 y.o. female who has above history reviewed by me presents for assessment prior to Cycle 6 adjuvant chemotherapy for stage Ia endometrial carcinosarcoma.   Patient reports feeling well She has no new complaints. Chronic neuropathy and since, she is on Cymbalta  30 mg daily .  Symptoms are stable.   No new complaints.  09/27/2023 CT chest wo contrast showed  1. No suspicious pulmonary nodules. Recommend annual lung cancer screening CT. Low-dose CT lung cancer screening is recommended for patients who are 28-49 years of age with a 20+ pack-year history of smoking, and who are currently smoking or quit <=15 years  ago. 2.  Aortic atherosclerosis (ICD10-I70.0). 3. Enlarged pulmonic trunk, indicative of pulmonary arterial hypertension. 4.  Emphysema   Review of Systems  Constitutional:  Negative for chills, fever, malaise/fatigue and weight loss.  HENT:  Negative for sore throat.   Eyes:  Negative for redness.  Respiratory:  Negative for cough, shortness of breath and wheezing.   Cardiovascular:  Negative for chest pain, palpitations and leg swelling.  Gastrointestinal:  Negative for abdominal pain, blood in stool, nausea and vomiting.  Genitourinary:  Negative for dysuria.  Musculoskeletal:  Negative for myalgias.  Skin:  Negative for rash.  Neurological:  Negative for dizziness, tingling and tremors.  Endo/Heme/Allergies:  Does not bruise/bleed easily.  Psychiatric/Behavioral:  Negative for hallucinations.     MEDICAL HISTORY:  Past Medical History:  Diagnosis Date   Arthritis    Cancer (HCC) 2019   endometrial   Endometrial mass    History of chicken pox    History of kidney stones    History of measles    History of mumps    Osteoporosis    Personal history of chemotherapy    Personal history of radiation therapy    PMB (postmenopausal bleeding)    Psoriasis    Wears dentures  full upper    SURGICAL HISTORY: Past Surgical History:  Procedure Laterality Date   APPENDECTOMY  1960   BREAST EXCISIONAL BIOPSY Left 1980   neg   BREAST SURGERY Left    biopsy   CATARACT EXTRACTION W/PHACO Left 08/30/2023   Procedure: CATARACT EXTRACTION PHACO AND INTRAOCULAR LENS PLACEMENT (IOC) LEFT;  Surgeon: Annell Kidney, MD;  Location: [facility];  Service: Ophthalmology;  Laterality: Left;  6.13 0:39.9   CATARACT EXTRACTION W/PHACO Right 09/13/2023   Procedure: CATARACT EXTRACTION PHACO AND INTRAOCULAR LENS PLACEMENT (IOC) RIGHT 5.46 00:26.9;  Surgeon: Annell Kidney, MD;  Location: [facility];  Service: Ophthalmology;  Laterality: Right;   COLONOSCOPY WITH  PROPOFOL  N/A 03/13/2020   Procedure: COLONOSCOPY WITH PROPOFOL ;  Surgeon: Luke Salaam, MD;  Location: [facility];  Service: Gastroenterology;  Laterality: N/A;   Injection varicose veins in legs Bilateral 2010   Dr. Jerre Moots   LITHOTRIPSY  1994, 2004   for renal stones   PORTA CATH INSERTION N/A 04/24/2018   Procedure: PORTA CATH INSERTION;  Surgeon: Jackquelyn Mass, MD;  Location: [facility] IN[facility]AS[facility] CV [facility];  Service: Cardiovascular;  Laterality: N/A;   PORTA CATH REMOVAL N/A 01/18/2021   Procedure: PORTA CATH REMOVAL;  Surgeon: Celso College, MD;  Location: [facility] IN[facility] CV [facility];  Service: Cardiovascular;  Laterality: N/A;   ROBOTIC ASSISTED TOTAL HYSTERECTOMY Bilateral 04/04/2018   Procedure: ROBOTIC ASSISTED TOTAL HYSTERECTOMY,SENTINEL LYMPH NODE MAPPING AND BIOPSIES,AORTIC LYMPH NODE DISSECTION;  Surgeon: Nobie Batch, MD;  Location: [facility] OR[facility];  Service: Gynecology;  Laterality: Bilateral;   TUBAL LIGATION  1980    SOCIAL HISTORY: Social History   Socioeconomic History   Marital status: Divorced    Spouse name: single   Number of children: 1   Years of education: Not on file   Highest education level: 10th grade  Occupational History   Occupation: retired  Tobacco Use   Smoking status: Every Day    Current packs/day: 0.50    Average packs/day: 0.5 packs/day for 31.3 years (15.7 ttl pk-yrs)    Types: Cigarettes    Start date: 1994   Smokeless tobacco: Never  Vaping Use   Vaping status: Former  Substance and Sexual Activity   Alcohol use: No    Alcohol/week: 0.0 standard drinks of alcohol   Drug use: No   Sexual activity: Not Currently  Other Topics Concern   Not on file  Social History Narrative   Not on file   Social Drivers of Health   Financial Resource Strain: Low Risk  (12/20/2023)   Overall Financial Resource Strain (CARDIA)    Difficulty of Paying Living Expenses: Not hard at all  Food Insecurity: No Food Insecurity (12/20/2023)   Hunger Vital Sign     Worried About Running Out of Food in the Last Year: Never true    Ran Out of Food in the Last Year: Never true  Transportation Needs: No Transportation Needs (12/20/2023)   PRAPARE - Administrator, Civil Service (Medical): No    Lack of Transportation (Non-Medical): No  Physical Activity: Inactive (12/20/2023)   Exercise Vital Sign    Days of Exercise per Week: 0 days    Minutes of Exercise per Session: 0 min  Stress: No Stress Concern Present (12/20/2023)   [employer] of Occupational Health - Occupational Stress Questionnaire    Feeling of Stress : Not at all  Social Connections: Moderately Isolated (12/20/2023)   Social Connection and Isolation Panel [NHANES]  Frequency of Communication with Friends and Family: Once a week    Frequency of Social Gatherings with Friends and Family: More than three times a week    Attends Religious Services: More than 4 times per year    Active Member of [employer] or Organizations: No    Attends Banker Meetings: Never    Marital Status: Divorced  Catering manager Violence: Not At Risk (12/20/2023)   Humiliation, Afraid, Rape, and Kick questionnaire    Fear of Current or Ex-Partner: No    Emotionally Abused: No    Physically Abused: No    Sexually Abused: No    FAMILY HISTORY: Family History  Problem Relation Age of Onset   Breast cancer Mother    Transient ischemic attack Mother    Arthritis Mother    Hypothyroidism Mother    Lung cancer Mother    Diabetes Father        type 2   Hypertension Father    Arthritis Father    Colon cancer Sister    Prostate cancer Brother    Hyperlipidemia Son    Stomach cancer Maternal Grandmother    Stroke Maternal Grandfather     ALLERGIES:  has no known allergies.  MEDICATIONS:  Current Outpatient Medications  Medication Sig Dispense Refill   DULoxetine  (CYMBALTA ) 30 MG capsule TAKE 1 CAPSULE BY MOUTH EVERY DAY 90 capsule 1   loratadine  (CLARITIN ) 10 MG tablet Take 1  tablet (10 mg total) by mouth daily. 30 tablet 11   Multiple Vitamins-Minerals (CENTRUM SILVER ADULT 50+ PO) Take 2 tablets by mouth daily.     naproxen sodium (ALEVE) 220 MG tablet Take 220 mg by mouth daily as needed.     triamcinolone  (KENALOG ) 0.025 % cream Apply 1 application. topically daily as needed (for areas on face. avoid eyes). 80 g 1   FLUZONE HIGH-DOSE QUADRIVALENT 0.7 ML SUSY  (Patient not taking: Reported on 03/01/2024)     No current facility-administered medications for this visit.     PHYSICAL EXAMINATION: ECOG PERFORMANCE STATUS: 1 - Symptomatic but completely ambulatory Vitals:   03/01/24 1133  BP: 114/72  Pulse: 76  Resp: 18  Temp: (!) 97.4 F (36.3 C)  SpO2: 95%   Filed Weights   03/01/24 1133  Weight: 167 lb 1.6 oz (75.8 kg)    Physical Exam Constitutional:      General: She is not in acute distress. HENT:     Head: Normocephalic and atraumatic.     Right Ear: External ear normal.  Eyes:     General: No scleral icterus.    Conjunctiva/sclera: Conjunctivae normal.     Pupils: Pupils are equal, round, and reactive to light.  Cardiovascular:     Rate and Rhythm: Normal rate and regular rhythm.  Pulmonary:     Effort: Pulmonary effort is normal. No respiratory distress.     Breath sounds: Normal breath sounds. No wheezing.  Abdominal:     General: Bowel sounds are normal. There is no distension.     Palpations: Abdomen is soft. There is no mass.     Tenderness: There is no abdominal tenderness.  Musculoskeletal:        General: No deformity. Normal range of motion.     Cervical back: Normal range of motion and neck supple.  Lymphadenopathy:     Cervical: No cervical adenopathy.  Skin:    General: Skin is warm and dry.     Findings: No erythema or rash.  Neurological:  Mental Status: She is alert and oriented to person, place, and time. Mental status is at baseline.     Cranial Nerves: No cranial nerve deficit.  Psychiatric:        Mood  and Affect: Mood normal.      LABORATORY DATA:  I have reviewed the data as listed    Latest Ref Rng & Units 03/01/2024   11:21 AM 03/02/2023    1:35 PM 03/21/2022    9:56 AM  CBC  WBC 4.0 - 10.5 K/uL 9.3  8.6  6.1   Hemoglobin 12.0 - 15.0 g/dL 16.1  09.6  04.5   Hematocrit 36.0 - 46.0 % 42.8  40.6  38.9   Platelets 150 - 400 K/uL 247  222  235       Latest Ref Rng & Units 03/01/2024   11:21 AM 03/02/2023    1:35 PM 03/21/2022    9:56 AM  CMP  Glucose 70 - 99 mg/dL 409  89  97   BUN 8 - 23 mg/dL 19  20  18    Creatinine 0.44 - 1.00 mg/dL 8.11  9.14  7.82   Sodium 135 - 145 mmol/L 137  137  138   Potassium 3.5 - 5.1 mmol/L 3.6  3.9  4.5   Chloride 98 - 111 mmol/L 106  105  106   CO2 22 - 32 mmol/L 21  25  27    Calcium 8.9 - 10.3 mg/dL 9.0  8.9  8.8   Total Protein 6.5 - 8.1 g/dL 7.3  6.7  6.8   Total Bilirubin 0.0 - 1.2 mg/dL 0.8  0.7  0.6   Alkaline Phos 38 - 126 U/L 62  55  60   AST 15 - 41 U/L 22  17  19    ALT 0 - 44 U/L 15  12  13        RADIOGRAPHIC STUDIES: I have personally reviewed the radiological images as listed and agreed with the findings in the report. No results found.

## 2024-03-01 NOTE — Assessment & Plan Note (Signed)
 Recommend annual lung cancer screening.  Refer to lung cancer screening program.  Recommend smoking cessation.

## 2024-03-01 NOTE — Assessment & Plan Note (Signed)
 Stable symptoms continue Cymbalta  30 mg daily.   She will ask PCP for refills in the future.

## 2024-03-01 NOTE — Assessment & Plan Note (Addendum)
 Stage IA Uterine carcinosarcoma  Status post 6 cycles adjuvant carboplatinum AUC 5 and Taxol  135 mg/m. Clinically patient is doing very well Labs reviewed and discussed with patient. CA125 has been monitored and has been stable.  Today's levels are pending She is more than 5 year after finished treatment.  Follow up with oncology PRN.  Patient will see gynecology oncology annually.

## 2024-03-02 LAB — CA 125: Cancer Antigen (CA) 125: 7.9 U/mL (ref 0.0–38.1)

## 2024-03-04 ENCOUNTER — Encounter: Payer: Self-pay | Admitting: Oncology

## 2024-03-05 ENCOUNTER — Encounter: Payer: Self-pay | Admitting: Family Medicine

## 2024-03-05 ENCOUNTER — Ambulatory Visit (INDEPENDENT_AMBULATORY_CARE_PROVIDER_SITE_OTHER): Admitting: Family Medicine

## 2024-03-05 VITALS — BP 118/70 | HR 75 | Temp 98.5°F | Ht 67.0 in | Wt 167.0 lb

## 2024-03-05 DIAGNOSIS — J0140 Acute pansinusitis, unspecified: Secondary | ICD-10-CM

## 2024-03-05 MED ORDER — DULOXETINE HCL 30 MG PO CPEP: 30.0000 mg | ORAL_CAPSULE | Freq: Every day | ORAL | 1 refills | Status: DC

## 2024-03-05 MED ORDER — FLUTICASONE PROPIONATE 50 MCG/ACT NA SUSP: 2.0000 | Freq: Every day | NASAL | 6 refills | Status: DC

## 2024-03-05 MED ORDER — AMOXICILLIN-POT CLAVULANATE 875-125 MG PO TABS: 1.0000 | ORAL_TABLET | Freq: Two times a day (BID) | ORAL | 0 refills | Status: AC

## 2024-03-05 NOTE — Progress Notes (Signed)
 Acute visit   Patient: Katherine Snyder   DOB: Apr 05, 1951   73 y.o. Female  MRN: [id number] PCP: Carlean Charter, DO   Chief Complaint  Patient presents with   Cough    Patient is present due to productive cough with greenish phlegm, nasal and chest congestion and sinus pressure with eyes yellow and crusty while feeling swole. She reports no fever and that over the counter medication is not helping. Patient has had symptoms for a week and half. She has been taking allergy relief, loratadine  tablets 10 mg and mucus extended release 600 mg. She reports at times they feel like things are getting worse but also just lingering. Reports sometimes headaches   Subjective    Discussed the use of AI scribe software for clinical note transcription with the patient, who gave verbal consent to proceed.  History of Present Illness   Katherine Snyder is a 73 year old female who presents with congestion, thick green phlegm, and cough for a week and a half.  She experiences runny eyes and postnasal drip. The cough produces a smell. Loratadine  and Mucinex, previously effective, have not provided relief this time. No previous similar symptoms that did not resolve with her usual allergy medications.        Review of Systems  Objective    BP 118/70 (BP Location: Left Arm, Patient Position: Sitting, Cuff Size: Normal)   Pulse 75   Temp 98.5 F (36.9 C) (Oral)   Ht 5\' 7"  (1.702 m)   Wt 167 lb (75.8 kg)   SpO2 96%   BMI 26.16 kg/m  Physical Exam Vitals reviewed.  Constitutional:      General: She is not in acute distress.    Appearance: Normal appearance. She is well-developed. She is not diaphoretic.  HENT:     Head: Normocephalic and atraumatic.     Right Ear: Tympanic membrane, ear canal and external ear normal.     Left Ear: Tympanic membrane, ear canal and external ear normal.     Nose: Congestion present.     Mouth/Throat:     Mouth: Mucous membranes are moist.     Pharynx: Oropharynx  is clear. Posterior oropharyngeal erythema present. No oropharyngeal exudate.  Eyes:     General: No scleral icterus.    Conjunctiva/sclera: Conjunctivae normal.     Pupils: Pupils are equal, round, and reactive to light.  Cardiovascular:     Rate and Rhythm: Normal rate and regular rhythm.     Heart sounds: Normal heart sounds. No murmur heard. Pulmonary:     Effort: Pulmonary effort is normal. No respiratory distress.     Breath sounds: Normal breath sounds. No wheezing or rales.  Musculoskeletal:     Cervical back: Neck supple.     Right lower leg: No edema.     Left lower leg: No edema.  Lymphadenopathy:     Cervical: No cervical adenopathy.  Skin:    General: Skin is warm and dry.     Findings: No rash.  Neurological:     Mental Status: She is alert.       No results found for any visits on 03/05/24.  Assessment & Plan     Problem List Items Addressed This Visit   None Visit Diagnoses       Acute non-recurrent pansinusitis    -  Primary   Relevant Medications   fluticasone (FLONASE) 50 MCG/ACT nasal spray   amoxicillin-clavulanate (AUGMENTIN) 875-125  MG tablet           Acute sinusitis Acute sinusitis with symptoms persisting for 10 days, including congestion, thick green sputum, cough, and sinus pressure, likely bacterial due to duration and symptoms. Lungs are clear, excluding bronchitis or pneumonia. Augmentin is selected for its efficacy against bacterial sinus infections. Flonase is recommended to reduce sinus inflammation and improve drainage. - Prescribe Augmentin (amoxicillin/clavulanate) twice daily for 7 days, to be taken with food to prevent gastrointestinal upset. - Continue loratadine  and Mucinex for symptom relief. - Prescribe Flonase nasal spray, two sprays in each nostril once daily at night, to reduce sinus inflammation and improve drainage. - Advise continued use of Flonase through allergy season if beneficial. - Instruct to report if symptoms  do not improve.  Peripheral neuropathy Peripheral neuropathy managed effectively with duloxetine , without interference from current sinusitis treatment. - Send prescription for duloxetine  to [facility] in [place].  Cancer Cancer in remission with no active issues.         Meds ordered this encounter  Medications   DULoxetine  (CYMBALTA ) 30 MG capsule    Sig: Take 1 capsule (30 mg total) by mouth daily.    Dispense:  90 capsule    Refill:  1   fluticasone (FLONASE) 50 MCG/ACT nasal spray    Sig: Place 2 sprays into both nostrils daily.    Dispense:  16 g    Refill:  6   amoxicillin-clavulanate (AUGMENTIN) 875-125 MG tablet    Sig: Take 1 tablet by mouth 2 (two) times daily for 7 days.    Dispense:  14 tablet    Refill:  0     Return if symptoms worsen or fail to improve.      Katherine Agreste, MD  [facility] [phone] (phone) [phone] (fax)  [facility]

## 2024-04-06 ENCOUNTER — Emergency Department
Admission: EM | Admit: 2024-04-06 | Discharge: 2024-04-06 | Disposition: A | Discharge status: Home / Self Care | Attending: Emergency Medicine | Admitting: Emergency Medicine

## 2024-04-06 ENCOUNTER — Emergency Department

## 2024-04-06 ENCOUNTER — Other Ambulatory Visit: Payer: Self-pay

## 2024-04-06 DIAGNOSIS — R109 Unspecified abdominal pain: Secondary | ICD-10-CM | POA: Diagnosis present

## 2024-04-06 DIAGNOSIS — N132 Hydronephrosis with renal and ureteral calculous obstruction: Secondary | ICD-10-CM | POA: Insufficient documentation

## 2024-04-06 DIAGNOSIS — Z853 Personal history of malignant neoplasm of breast: Secondary | ICD-10-CM | POA: Insufficient documentation

## 2024-04-06 DIAGNOSIS — R1031 Right lower quadrant pain: Secondary | ICD-10-CM

## 2024-04-06 DIAGNOSIS — N201 Calculus of ureter: Secondary | ICD-10-CM | POA: Diagnosis not present

## 2024-04-06 DIAGNOSIS — K5730 Diverticulosis of large intestine without perforation or abscess without bleeding: Secondary | ICD-10-CM | POA: Diagnosis not present

## 2024-04-06 DIAGNOSIS — N134 Hydroureter: Secondary | ICD-10-CM | POA: Diagnosis not present

## 2024-04-06 LAB — CBC
HCT: 41.4 % (ref 36.0–46.0)
Hemoglobin: 13.6 g/dL (ref 12.0–15.0)
MCH: 29.7 pg (ref 26.0–34.0)
MCHC: 32.9 g/dL (ref 30.0–36.0)
MCV: 90.4 fL (ref 80.0–100.0)
Platelets: 247 10*3/uL (ref 150–400)
RBC: 4.58 MIL/uL (ref 3.87–5.11)
RDW: 12.9 % (ref 11.5–15.5)
WBC: 9.6 10*3/uL (ref 4.0–10.5)
nRBC: 0 % (ref 0.0–0.2)

## 2024-04-06 LAB — COMPREHENSIVE METABOLIC PANEL WITH GFR
ALT: 13 U/L (ref 0–44)
AST: 18 U/L (ref 15–41)
Albumin: 3.8 g/dL (ref 3.5–5.0)
Alkaline Phosphatase: 53 U/L (ref 38–126)
Anion gap: 9 (ref 5–15)
BUN: 21 mg/dL (ref 8–23)
CO2: 23 mmol/L (ref 22–32)
Calcium: 9.1 mg/dL (ref 8.9–10.3)
Chloride: 106 mmol/L (ref 98–111)
Creatinine, Ser: 0.86 mg/dL (ref 0.44–1.00)
GFR, Estimated: >60 mL/min (ref >60)
Glucose, Bld: 124 mg/dL — ABNORMAL HIGH (ref 70–99)
Potassium: 3.7 mmol/L (ref 3.5–5.1)
Sodium: 138 mmol/L (ref 135–145)
Total Bilirubin: 0.6 mg/dL (ref 0.0–1.2)
Total Protein: 6.8 g/dL (ref 6.5–8.1)

## 2024-04-06 LAB — URINALYSIS, ROUTINE W REFLEX MICROSCOPIC
Bacteria, UA: NONE SEEN
Bilirubin Urine: NEGATIVE
Glucose, UA: NEGATIVE mg/dL
Ketones, ur: NEGATIVE mg/dL
Leukocytes,Ua: NEGATIVE
Nitrite: NEGATIVE
Protein, ur: 30 mg/dL — AB
RBC / HPF: >50 RBC/hpf (ref 0–5)
Specific Gravity, Urine: 1.02 (ref 1.005–1.030)
pH: 5 (ref 5.0–8.0)

## 2024-04-06 LAB — LIPASE, BLOOD: Lipase: 43 U/L (ref 11–51)

## 2024-04-06 MED ORDER — HYDROCODONE-ACETAMINOPHEN 5-325 MG PO TABS
1.0000 | ORAL_TABLET | Freq: Once | ORAL | Status: AC
  Administered 2024-04-06: 1 via ORAL
  Filled 2024-04-06: qty 1

## 2024-04-06 MED ORDER — HYDROCODONE-ACETAMINOPHEN 5-325 MG PO TABS: 1.0000 | ORAL_TABLET | Freq: Three times a day (TID) | ORAL | 0 refills | Status: AC | PRN

## 2024-04-06 MED ORDER — ONDANSETRON 4 MG PO TBDP
4.0000 mg | ORAL_TABLET | Freq: Three times a day (TID) | ORAL | 0 refills | Status: DC | PRN
Start: 2024-04-06 — End: 2024-05-08

## 2024-04-06 MED ORDER — ONDANSETRON 4 MG PO TBDP
4.0000 mg | ORAL_TABLET | Freq: Once | ORAL | Status: AC
  Administered 2024-04-06: 4 mg via ORAL
  Filled 2024-04-06: qty 1

## 2024-04-06 NOTE — Discharge Instructions (Addendum)
 Your exam, labs, and CT scan confirm the recent passage of a kidney stone. You have some mild irritation to the right kidney and tube. Continue to drink plenty of non-carbonated drinks, and empty your bladder regularly. Take OTC Tylenol  or Motrin  as needed for pain. Follow-up with your Primary provider as needed.

## 2024-04-06 NOTE — ED Provider Notes (Signed)
 [facility] Emergency Department Provider Note     Event Date/Time   First MD Initiated Contact with Patient 04/06/24 1505     (approximate)   History   Abdominal Pain   HPI  Katherine Snyder is a 73 y.o. female with a history of osteoporosis, endometriosis, cyst kidney stones, and breast cancer status postchemotherapy, who presents to the ED with complaints of right sided abdominal pain.  She endorses onset of symptoms this morning at about 6 AM when she awoke.  She reports the pain has been intermittent and returned this afternoon before presented to the ED.  She had an episode of nonbloody, nonbilious emesis this morning.  Patient denies any frank fevers, chills, sweats, or chest pain.  No bladder or bowel changes and no diarrhea or constipation reported.   Physical Exam   Triage Vital Signs: ED Triage Vitals  Encounter Vitals Group     BP 04/06/24 1329 116/78     Systolic BP Percentile --      Diastolic BP Percentile --      Pulse Rate 04/06/24 1329 78     Resp 04/06/24 1329 17     Temp 04/06/24 1329 98.8 F (37.1 C)     Temp src --      SpO2 04/06/24 1329 97 %     Weight 04/06/24 1327 173 lb (78.5 kg)     Height 04/06/24 1327 5\' 7"  (1.702 m)     Head Circumference --      Peak Flow --      Pain Score 04/06/24 1327 0     Pain Loc --      Pain Education --      Exclude from Growth Chart --     Most recent vital signs: Vitals:   04/06/24 1329 04/06/24 1638  BP: 116/78 122/75  Pulse: 78 75  Resp: 17 16  Temp: 98.8 F (37.1 C) 98 F (36.7 C)  SpO2: 97% 98%    General Awake, no distress. NAD HEENT NCAT. PERRL. EOMI. No rhinorrhea. Mucous membranes are moist.  CV:  Good peripheral perfusion. RRR RESP:  Normal effort. CTA ABD:  No distention.  Soft and nontender.  No rebound, guarding, or rigidity noted, no mass elicited.   ED Results / Procedures / Treatments   Labs (all labs ordered are listed, but only abnormal results are  displayed) Labs Reviewed  COMPREHENSIVE METABOLIC PANEL WITH GFR - Abnormal; Notable for the following components:      Result Value   Glucose, Bld 124 (*)    All other components within normal limits  URINALYSIS, ROUTINE W REFLEX MICROSCOPIC - Abnormal; Notable for the following components:   Color, Urine YELLOW (*)    APPearance CLOUDY (*)    Hgb urine dipstick LARGE (*)    Protein, ur 30 (*)    All other components within normal limits  LIPASE, BLOOD  CBC    EKG   RADIOLOGY  I personally viewed and evaluated these images as part of my medical decision making, as well as reviewing the written report by the radiologist.  ED Provider Interpretation: Hydronephrosis with hydroureter with nonobstructing calculus noted bilaterally  CT Renal Stone Study Result Date: 04/06/2024 CLINICAL DATA:  Right-sided abdominal and flank pain. History of endometrial cancer. EXAM: CT ABDOMEN AND PELVIS WITHOUT CONTRAST TECHNIQUE: Multidetector CT imaging of the abdomen and pelvis was performed following the standard protocol without IV contrast. RADIATION DOSE REDUCTION: This exam was performed  according to the departmental dose-optimization program which includes automated exposure control, adjustment of the mA and/or kV according to patient size and/or use of iterative reconstruction technique. COMPARISON:  There is atelectasis or scarring in the lung bases. FINDINGS: Lower chest: There is a left lower lobe nodule measuring 5 mm which is unchanged from 2022. Hepatobiliary: Rounded hypodensities throughout the liver are most compatible with cysts measuring up to 2.4 cm and appear unchanged. Gallbladder and bile ducts are within normal limits. Pancreas: Unremarkable. No pancreatic ductal dilatation or surrounding inflammatory changes. Spleen: Normal in size without focal abnormality. Adrenals/Urinary Tract: There is mild right-sided hydronephrosis and hydroureter to the level of the mid ureter, but no  obstructing calculus identified. The bladder is within normal limits. There are punctate nonobstructing bilateral renal calculi, left greater than right. The adrenal glands are within normal limits. Stomach/Bowel: Stomach is within normal limits. Appendix is not seen. Colonic diverticulosis present. No evidence of bowel wall thickening, distention, or inflammatory changes. Vascular/Lymphatic: Aortic atherosclerosis. No enlarged abdominal or pelvic lymph nodes. Reproductive: Status post hysterectomy. No adnexal masses. Other: No abdominal wall hernia or abnormality. No abdominopelvic ascites. Musculoskeletal: Degenerative changes affect the spine the spine. IMPRESSION: 1. Mild right-sided hydronephrosis and hydroureter to the level of the mid ureter, but no obstructing calculus identified. Findings may be related to recently passed stone or infection. 2. Nonobstructing bilateral renal calculi. 3. Colonic diverticulosis. 4. Stable hepatic cysts. 5. Stable 5 mm left lower lobe pulmonary nodule unchanged from 2022 and favored as benign. 6. Aortic atherosclerosis. Aortic Atherosclerosis (ICD10-I70.0). Electronically Signed   By: Tyron Gallon M.D.   On: 04/06/2024 17:55     PROCEDURES:  Critical Care performed: No  Procedures   MEDICATIONS ORDERED IN ED: Medications  HYDROcodone-acetaminophen  (NORCO/VICODIN) 5-325 MG per tablet 1 tablet (1 tablet Oral Given 04/06/24 1811)  ondansetron  (ZOFRAN -ODT) disintegrating tablet 4 mg (4 mg Oral Given 04/06/24 1813)     IMPRESSION / MDM / ASSESSMENT AND PLAN / ED COURSE  I reviewed the triage vital signs and the nursing notes.                              Differential diagnosis includes, but is not limited to, ovarian cyst, ovarian torsion, acute appendicitis, diverticulitis, urinary tract infection/pyelonephritis, bowel obstruction, colitis, renal colic, gastroenteritis, hernia,  etc.  Patient's presentation is most consistent with acute complicated illness  / injury requiring diagnostic workup.  Patient's diagnosis is consistent with acute right flank pain secondary to ureterolithiasis.  Patient with recent imaging records tach patient presents no acute distress, not endorsing any acute pain.  No lab abnormalities are noted.  UA does show significant hematuria without evidence of bacteriuria.  CT images reviewed and interpreted by me, reveal evidence of likely stone passage including mild right hydronephrosis and hydroureter.  Patient is stable at this time for outpatient management of pain control and likely passed stone patient is encouraged to increase fluid intake and empty her bladder regularly.  Patient will be discharged home with prescriptions for hydrocodone and Zofran . Patient is to follow up with her PCP or urology as discussed, as needed or otherwise directed. Patient is given ED precautions to return to the ED for any worsening or new symptoms.   FINAL CLINICAL IMPRESSION(S) / ED DIAGNOSES   Final diagnoses:  Ureterolithiasis  Right lower quadrant abdominal pain     Rx / DC Orders   ED Discharge Orders  Ordered    ondansetron  (ZOFRAN -ODT) 4 MG disintegrating tablet  Every 8 hours PRN        04/06/24 1810    HYDROcodone-acetaminophen  (NORCO/VICODIN) 5-325 MG tablet  3 times daily PRN        04/06/24 1810             Note:  This document was prepared using Dragon voice recognition software and may include unintentional dictation errors.    May Sparks, PA-C 04/07/24 1659    Jacquie Maudlin, MD 04/07/24 2333

## 2024-04-06 NOTE — ED Triage Notes (Signed)
 Pt comes in via pov with complaints of right sided abdominal pain that started this morning at about 6am. Pt states that the pain went away, and came back this afternoon shortly before she came to be seen. Pt had an episode of vomiting this morning at 6am. The pain has been off and on since it started this morning.  Pt is alert and oriented x4, with no signs of acute distress at this time.

## 2024-04-08 ENCOUNTER — Encounter: Payer: 59 | Admitting: Family Medicine

## 2024-04-10 ENCOUNTER — Telehealth: Payer: Self-pay

## 2024-04-10 DIAGNOSIS — N200 Calculus of kidney: Secondary | ICD-10-CM

## 2024-04-10 NOTE — Telephone Encounter (Signed)
 Patient was seen in the ER on 04/06/24 and CT revealed bilateral non obstructing renal calculi.  Patient states she was given Norco and Zofran .  She is out of both and states she needs more as she is in significant pain and having nausea. [facility] [place].    Copied from CRM [id number]. Topic: Clinical - Medical Advice >> Apr 10, 2024  1:56 PM Chasity T wrote: Lyell Samuel a paramedic is calling in for patient due to her having kindey stones and doesn't have anymore medicine to pass the kindey stone. Please contact her at [phone]

## 2024-04-11 ENCOUNTER — Ambulatory Visit: Payer: Self-pay

## 2024-04-11 MED ORDER — TAMSULOSIN HCL 0.4 MG PO CAPS: 0.4000 mg | ORAL_CAPSULE | Freq: Every day | ORAL | 0 refills | Status: DC

## 2024-04-11 MED ORDER — NAPROXEN 500 MG PO TABS: 500.0000 mg | ORAL_TABLET | Freq: Two times a day (BID) | ORAL | 0 refills | Status: DC

## 2024-04-11 MED ORDER — OXYCODONE-ACETAMINOPHEN 5-325 MG PO TABS
1.0000 | ORAL_TABLET | Freq: Four times a day (QID) | ORAL | 0 refills | Status: AC | PRN
Start: 2024-04-11 — End: 2024-04-16

## 2024-04-11 NOTE — Telephone Encounter (Signed)
 Called and spoke with patient.  She was having a lot of pain yesterday, which led her to call EMT services.  They advised her she would receive the same treatment/evaluation as before and recommended she follow-up here instead.  Patient reports she has been having chills and sweating episodes, including prior to going to the ER 04/06/2024, which is part of what prompted her to go in the first place.  Her urinalysis at the ER was negative for infection but did show significant blood.  Her CT scan showed mild right-sided hydronephrosis and hydroureter down to the mid ureter without obstructing calculus.  It also showed nonobstructing bilateral renal calculi.  She does reside with her sister.  Given her ongoing pain, which has been unrelieved with Norco prescribed by the ER, I will go ahead and prescribe her tamsulosin today, along with scheduled naproxen.  I will also send a short course of Percocet for breakthrough pain.  Discussed all this with patient and advised her to drink plenty of fluids, which she states she has already been trying to do to flush out the kidney stones.    Advised her to have a low threshold to go back to the ER if she begins to develop worsening symptoms, including but not limited to worsening pain and fevers.  Patient expressed understanding of these instructions.  Will also refer her to urology today for further evaluation and recommendations.  She is a previous patient of Dr. Cherylene Corrente.  During our conversation, she did note that, when a stent was being placed in her right ureter previously (several years ago), the right ureter was noted to have a very small lumen.

## 2024-04-11 NOTE — Addendum Note (Signed)
 Addended by: Judyann Number on: 04/11/2024 03:02 PM   Modules accepted: Orders

## 2024-04-11 NOTE — Telephone Encounter (Signed)
 FYI Only or Action Required?: Action required by provider  Patient was last seen in primary care on 03/05/2024 by Mazie Speed, MD. Called Nurse Triage reporting No chief complaint on file.. Symptoms began several days ago. Interventions attempted: Prescription medications: Hydrocodone-Acetaminophen  as prescribed by hospital with no relief. Symptoms are: gradually worsening.  Triage Disposition: See HCP Within 4 Hours (Or PCP Triage) - refused appt, stating she is unable to drive with the pain and wants pcp to review ED notes and determine if further pain meds can be called in.  Patient/caregiver understands and will follow disposition?: Yes   *If med can be called in - please [facility] [place] [address].   Copied from CRM [id number]. Topic: Clinical - Red Word Triage >> Apr 11, 2024  8:49 AM Katherine Snyder wrote: Red Word that prompted transfer to Nurse Triage: Pt calling to f/u on medication to pass a painful kidney stone Reason for Disposition  [1] MODERATE pain (e.g., interferes with normal activities) AND [2] constant AND [3] lasting longer than 4 hours AND [4] NOT improved with pain medicine  Answer Assessment - Initial Assessment Questions 1. MAIN CONCERN OR SYMPTOM:  "What is your main concern right now?" "What question do you have?" "What's the main symptom you're worried about?" (e.g., blood in urine, flank pain)     Needing stronger medication to pass urine stone 2. ONSET: "When did the  pain  start?"     05/30 3. BETTER-SAME-WORSE: "Are you getting better, staying the same, or getting worse compared to how you felt at your last visit to the doctor (most recent medical visit)?"     worse 4. VISIT DATE: "When were you seen?" (Date)     ED diagnosed with kidney stone on 05/31 5. VISIT DOCTOR: "What is the name of the doctor (or NP/PA) taking care of you now?"     Dr. Athena Bland 6. VISIT DIAGNOSIS:  "What was the main symptom or problem that you were seen for?" "Were you  given a diagnosis?"      Given diagnosis for kidney stone - did CT scan - 5/31 7. TREATMENT: "Did you have any treatment for your kidney stone?" (e.g., none, doctor exam, lithotripsy, medicines, stent) If Yes, ask: "When did you have this treatment?"     Oxycodonee-Acetaminophen  and is not helping 9. PAIN: "Is there any pain?" If Yes, ask: "How bad is it?"  (Scale 0-10; or mild, moderate, severe)    - NONE (0): no pain    - MILD (1-3): doesn't interfere with normal activities     - MODERATE (4-7): interferes with normal activities or awakens from sleep     - SEVERE (8-10): excruciating pain, unable to do any normal activities     > 10 10. FEVER: "Do you have a fever?" If Yes, ask: "What is it, how was it measured, and when did it start?"       Doesn't know if she's had a fever but has had chills and been hot 11. OTHER SYMPTOMS: "Do you have any other symptoms?" (e.g., abdomen pain, blood in urine, vomiting)  Vomiting started night she left the hospital on 5/31 - has vomited about every day. Maintaining hydration but not eating as much.   Patient states yesterday she called EMS because her pain was so bad but did not transport to the hospital.  Protocols used: Kidney Stone Follow-up Call-[facility]

## 2024-05-01 ENCOUNTER — Other Ambulatory Visit: Payer: Self-pay | Admitting: Family Medicine

## 2024-05-01 DIAGNOSIS — N200 Calculus of kidney: Secondary | ICD-10-CM

## 2024-05-06 ENCOUNTER — Ambulatory Visit: Admitting: Urology

## 2024-05-08 ENCOUNTER — Encounter: Admitting: Family Medicine

## 2024-05-08 ENCOUNTER — Encounter: Payer: Self-pay | Admitting: Family Medicine

## 2024-05-08 ENCOUNTER — Ambulatory Visit: Admitting: Family Medicine

## 2024-05-08 VITALS — BP 119/66 | HR 66 | Ht 67.0 in | Wt 166.9 lb

## 2024-05-08 DIAGNOSIS — J432 Centrilobular emphysema: Secondary | ICD-10-CM | POA: Diagnosis not present

## 2024-05-08 DIAGNOSIS — Z23 Encounter for immunization: Secondary | ICD-10-CM | POA: Diagnosis not present

## 2024-05-08 DIAGNOSIS — L409 Psoriasis, unspecified: Secondary | ICD-10-CM | POA: Diagnosis not present

## 2024-05-08 DIAGNOSIS — I2721 Secondary pulmonary arterial hypertension: Secondary | ICD-10-CM | POA: Diagnosis not present

## 2024-05-08 DIAGNOSIS — F17200 Nicotine dependence, unspecified, uncomplicated: Secondary | ICD-10-CM | POA: Diagnosis not present

## 2024-05-08 DIAGNOSIS — J441 Chronic obstructive pulmonary disease with (acute) exacerbation: Secondary | ICD-10-CM | POA: Diagnosis not present

## 2024-05-08 DIAGNOSIS — I700 Atherosclerosis of aorta: Secondary | ICD-10-CM | POA: Diagnosis not present

## 2024-05-08 DIAGNOSIS — G620 Drug-induced polyneuropathy: Secondary | ICD-10-CM | POA: Diagnosis not present

## 2024-05-08 DIAGNOSIS — C541 Malignant neoplasm of endometrium: Secondary | ICD-10-CM

## 2024-05-08 DIAGNOSIS — Z716 Tobacco abuse counseling: Secondary | ICD-10-CM

## 2024-05-08 DIAGNOSIS — Z0001 Encounter for general adult medical examination with abnormal findings: Secondary | ICD-10-CM

## 2024-05-08 DIAGNOSIS — Z87898 Personal history of other specified conditions: Secondary | ICD-10-CM

## 2024-05-08 DIAGNOSIS — Z0000 Encounter for general adult medical examination without abnormal findings: Secondary | ICD-10-CM

## 2024-05-08 MED ORDER — ALBUTEROL SULFATE HFA 108 (90 BASE) MCG/ACT IN AERS: 2.0000 | INHALATION_SPRAY | Freq: Four times a day (QID) | RESPIRATORY_TRACT | 2 refills | Status: AC | PRN

## 2024-05-08 MED ORDER — TRIAMCINOLONE ACETONIDE 0.025 % EX CREA: 1.0000 | TOPICAL_CREAM | Freq: Every day | CUTANEOUS | 1 refills | Status: AC | PRN

## 2024-05-08 MED ORDER — ZOSTER VAC RECOMB ADJUVANTED 50 MCG/0.5ML IM SUSR: 0.5000 mL | Freq: Once | INTRAMUSCULAR | 0 refills | Status: AC

## 2024-05-08 MED ORDER — SPACER/AERO-HOLDING CHAMBERS DEVI: 1.0000 | 1 refills | Status: AC

## 2024-05-08 MED ORDER — NICOTINE 14 MG/24HR TD PT24: 14.0000 mg | MEDICATED_PATCH | Freq: Every day | TRANSDERMAL | 0 refills | Status: AC

## 2024-05-08 MED ORDER — AZITHROMYCIN 250 MG PO TABS: ORAL_TABLET | ORAL | 0 refills | Status: AC

## 2024-05-08 NOTE — Progress Notes (Signed)
 Complete physical exam   Patient: Katherine Snyder   DOB: [birth year]   73 y.o. Female  MRN: [id number] Visit Date: 05/08/2024  Today's healthcare provider: LAURAINE LOISE BUOY, DO   Chief Complaint  Patient presents with   Annual Exam    Sleeping pattern: good Exercising: sometimes No concerns   Subjective    Katherine Snyder is a 73 y.o. female who presents today for a complete physical exam.  She reports consuming a general, well-balanced diet. She walks for exercise usually. She generally feels well. She reports sleeping well. She does not have additional problems to discuss today.   HPI HPI     Annual Exam    Additional comments: Sleeping pattern: good Exercising: sometimes No concerns      Last edited by Wilfred Hargis GORMAN, CMA on 05/08/2024  3:13 PM.      Katherine Snyder is a 73 year old female who presents for an annual physical exam.  She has a history of kidney stones with previously severe pain that was not adequately managed with Norco. The pain has since resolved.  She has COPD with mild emphysema. She developed a new dry cough a week ago, but denies shortness of breath. She has not used an inhaler before. She has a history of pulmonary nodules, which were reportedly noted to be resolved in a previous visit.  Her CT renal stone study done 04/06/2024 showed a stable 5 mm left lower lobe pulmonary nodule which was unchanged since 2022 and favored to be benign.  Her CT chest without contrast done 09/27/2023 showedno suspicious pulmonary nodules and included recommendation for continued annual follow-up.  She has psoriasis and uses triamcinolone  cream as needed. She has some cream left and does not currently need a refill.  She takes duloxetine  for mood, which also helps with numbness and tingling in her legs and feet, a residual effect from chemotherapy. She confirms she has enough medication at home.  She takes loratadine  daily for allergies and reports itchy eyes. She prefers  to purchase it over the counter due to insurance costs.  She has a history of cataract surgery on both eyes and has a follow-up appointment scheduled for July 24th.  She smokes half a pack of cigarettes per day since 1994 and has attempted to quit cold [place] in the past. She reports a history of thyroid  biopsy but no current thyroid  issues.    Past Medical History:  Diagnosis Date   Arthritis    Cancer (HCC) 2019   endometrial   Endometrial mass    History of chicken pox    History of kidney stones    History of measles    History of mumps    Osteoporosis    Personal history of chemotherapy    Personal history of radiation therapy    PMB (postmenopausal bleeding)    Psoriasis    Wears dentures    full upper   Past Surgical History:  Procedure Laterality Date   APPENDECTOMY  1960   BREAST EXCISIONAL BIOPSY Left 1980   neg   BREAST SURGERY Left    biopsy   CATARACT EXTRACTION W/PHACO Left 08/30/2023   Procedure: CATARACT EXTRACTION PHACO AND INTRAOCULAR LENS PLACEMENT (IOC) LEFT;  Surgeon: Mittie Gaskin, MD;  Location: [facility];  Service: Ophthalmology;  Laterality: Left;  6.13 0:39.9   CATARACT EXTRACTION W/PHACO Right 09/13/2023   Procedure: CATARACT EXTRACTION PHACO AND INTRAOCULAR LENS PLACEMENT (IOC) RIGHT 5.46 00:26.9;  Surgeon: Mittie Gaskin, MD;  Location: [facility];  Service: Ophthalmology;  Laterality: Right;   COLONOSCOPY WITH PROPOFOL  N/A 03/13/2020   Procedure: COLONOSCOPY WITH PROPOFOL ;  Surgeon: Therisa Bi, MD;  Location: [facility];  Service: Gastroenterology;  Laterality: N/A;   Injection varicose veins in legs Bilateral 2010   Dr. Primus   LITHOTRIPSY  1994, 2004   for renal stones   PORTA CATH INSERTION N/A 04/24/2018   Procedure: PORTA CATH INSERTION;  Surgeon: Jama Cordella MATSU, MD;  Location: [facility];  Service: Cardiovascular;  Laterality: N/A;   PORTA CATH REMOVAL N/A 01/18/2021   Procedure: PORTA  CATH REMOVAL;  Surgeon: Marea Selinda RAMAN, MD;  Location: [facility];  Service: Cardiovascular;  Laterality: N/A;   ROBOTIC ASSISTED TOTAL HYSTERECTOMY Bilateral 04/04/2018   Procedure: ROBOTIC ASSISTED TOTAL HYSTERECTOMY,SENTINEL LYMPH NODE MAPPING AND BIOPSIES,AORTIC LYMPH NODE DISSECTION;  Surgeon: Elby Webb Loges, MD;  Location: [facility] ORS;  Service: Gynecology;  Laterality: Bilateral;   TUBAL LIGATION  1980   Social History   Socioeconomic History   Marital status: Divorced    Spouse name: single   Number of children: 1   Years of education: Not on file   Highest education level: 10th grade  Occupational History   Occupation: retired  Tobacco Use   Smoking status: Every Day    Current packs/day: 0.50    Average packs/day: 0.5 packs/day for 31.5 years (15.8 ttl pk-yrs)    Types: Cigarettes    Start date: 1994   Smokeless tobacco: Never  Vaping Use   Vaping status: Former  Substance and Sexual Activity   Alcohol use: No    Alcohol/week: 0.0 standard drinks of alcohol   Drug use: No   Sexual activity: Not Currently  Other Topics Concern   Not on file  Social History Narrative   Not on file   Social Drivers of Health   Financial Resource Strain: Low Risk  (12/20/2023)   Overall Financial Resource Strain (CARDIA)    Difficulty of Paying Living Expenses: Not hard at all  Food Insecurity: No Food Insecurity (12/20/2023)   Hunger Vital Sign    Worried About Running Out of Food in the Last Year: Never true    Ran Out of Food in the Last Year: Never true  Transportation Needs: No Transportation Needs (12/20/2023)   PRAPARE - Administrator, Civil Service (Medical): No    Lack of Transportation (Non-Medical): No  Physical Activity: Inactive (12/20/2023)   Exercise Vital Sign    Days of Exercise per Week: 0 days    Minutes of Exercise per Session: 0 min  Stress: No Stress Concern Present (12/20/2023)   [employer] of Occupational Health -  Occupational Stress Questionnaire    Feeling of Stress : Not at all  Social Connections: Moderately Isolated (12/20/2023)   Social Connection and Isolation Panel    Frequency of Communication with Friends and Family: Once a week    Frequency of Social Gatherings with Friends and Family: More than three times a week    Attends Religious Services: More than 4 times per year    Active Member of [employer] or Organizations: No    Attends Banker Meetings: Never    Marital Status: Divorced  Catering manager Violence: Not At Risk (12/20/2023)   Humiliation, Afraid, Rape, and Kick questionnaire    Fear of Current or Ex-Partner: No    Emotionally Abused: No    Physically Abused: No  Sexually Abused: No   Family Status  Relation Name Status   Mother  Deceased at age 48       died from Metastatic cancer. Gallblader cancer   Father  Alive   Sister  Alive   Brother  Alive   Son  Alive   MGM  Deceased   MGF  Deceased  No partnership data on file   Family History  Problem Relation Age of Onset   Breast cancer Mother    Transient ischemic attack Mother    Arthritis Mother    Hypothyroidism Mother    Lung cancer Mother    Diabetes Father        type 2   Hypertension Father    Arthritis Father    Colon cancer Sister    Prostate cancer Brother    Hyperlipidemia Son    Stomach cancer Maternal Grandmother    Stroke Maternal Grandfather    No Known Allergies  Patient Care Team: Anedra Penafiel, Lauraine SAILOR, DO as PCP - General (Family Medicine) Maurie Rayfield BIRCH, RN as Oncology Nurse Navigator Babara Call, MD as Consulting Physician (Oncology) [facility], [facility] (Optometry) Therisa Bi, MD as Consulting Physician (Gastroenterology)   Medications: Outpatient Medications Prior to Visit  Medication Sig   DULoxetine  (CYMBALTA ) 30 MG capsule Take 1 capsule (30 mg total) by mouth daily.   loratadine  (CLARITIN ) 10 MG tablet Take 1 tablet (10 mg total) by mouth daily.   Multiple  Vitamins-Minerals (CENTRUM SILVER ADULT 50+ PO) Take 2 tablets by mouth daily.   naproxen  sodium (ALEVE ) 220 MG tablet Take 220 mg by mouth daily as needed.   [DISCONTINUED] fluticasone  (FLONASE ) 50 MCG/ACT nasal spray Place 2 sprays into both nostrils daily.   [DISCONTINUED] naproxen  (NAPROSYN ) 500 MG tablet Take 1 tablet (500 mg total) by mouth 2 (two) times daily with a meal.   [DISCONTINUED] ondansetron  (ZOFRAN -ODT) 4 MG disintegrating tablet Take 1 tablet (4 mg total) by mouth every 8 (eight) hours as needed for nausea or vomiting.   [DISCONTINUED] tamsulosin  (FLOMAX ) 0.4 MG CAPS capsule Take 1 capsule (0.4 mg total) by mouth daily.   [DISCONTINUED] triamcinolone  (KENALOG ) 0.025 % cream Apply 1 application. topically daily as needed (for areas on face. avoid eyes).   No facility-administered medications prior to visit.    Review of Systems  Constitutional:  Negative for chills, fatigue and fever.  HENT:  Negative for congestion, ear pain, rhinorrhea, sneezing and sore throat.   Eyes: Negative.  Negative for pain and redness.  Respiratory:  Positive for cough. Negative for shortness of breath and wheezing.   Cardiovascular:  Negative for chest pain and leg swelling.  Gastrointestinal:  Negative for abdominal pain, blood in stool, constipation, diarrhea and nausea.  Endocrine: Negative for polydipsia and polyphagia.  Genitourinary: Negative.  Negative for dysuria, flank pain, hematuria, pelvic pain, vaginal bleeding and vaginal discharge.  Musculoskeletal:  Negative for arthralgias, back pain, gait problem and joint swelling.  Skin:  Negative for rash.  Neurological: Negative.  Negative for dizziness, tremors, seizures, weakness, light-headedness, numbness and headaches.  Hematological:  Negative for adenopathy.  Psychiatric/Behavioral: Negative.  Negative for behavioral problems, confusion and dysphoric mood. The patient is not nervous/anxious and is not hyperactive.       Objective     BP 119/66 (BP Location: Left Arm, Patient Position: Sitting, Cuff Size: Large)   Pulse 66   Ht 5' 7 (1.702 m)   Wt 166 lb 14.4 oz (75.7 kg)   SpO2 99%  BMI 26.14 kg/m    Physical Exam Vitals and nursing note reviewed.  Constitutional:      General: She is awake.     Appearance: Normal appearance.  HENT:     Head: Normocephalic and atraumatic.     Right Ear: Tympanic membrane, ear canal and external ear normal.     Left Ear: Tympanic membrane, ear canal and external ear normal.     Nose: Nose normal.     Mouth/Throat:     Mouth: Mucous membranes are moist.     Pharynx: Oropharynx is clear. No oropharyngeal exudate or posterior oropharyngeal erythema.  Eyes:     General: No scleral icterus.    Extraocular Movements: Extraocular movements intact.     Conjunctiva/sclera: Conjunctivae normal.     Pupils: Pupils are equal, round, and reactive to light.  Neck:     Thyroid : Thyromegaly (left upper lobe) present. No thyroid  tenderness.     Vascular: No carotid bruit.  Cardiovascular:     Rate and Rhythm: Normal rate and regular rhythm.     Pulses: Normal pulses.     Heart sounds: Normal heart sounds.  Pulmonary:     Effort: Pulmonary effort is normal. No tachypnea, bradypnea or respiratory distress.     Breath sounds: Normal breath sounds. No stridor. No wheezing, rhonchi or rales.  Abdominal:     General: Bowel sounds are normal. There is no distension.     Palpations: Abdomen is soft. There is no mass.     Tenderness: There is no abdominal tenderness. There is no guarding.     Hernia: No hernia is present.  Musculoskeletal:     Cervical back: Normal range of motion and neck supple.     Right lower leg: No edema.     Left lower leg: No edema.  Lymphadenopathy:     Cervical: No cervical adenopathy.  Skin:    General: Skin is warm and dry.  Neurological:     Mental Status: She is alert and oriented to person, place, and time. Mental status is at baseline.   Psychiatric:        Mood and Affect: Mood normal.        Behavior: Behavior normal.      Last depression screening scores    05/08/2024    3:15 PM 12/20/2023   10:49 AM 04/05/2023   10:42 AM  PHQ 2/9 Scores  PHQ - 2 Score 0 0 0  PHQ- 9 Score 0  0   Last fall risk screening    05/08/2024    3:15 PM  Fall Risk   Falls in the past year? 0  Number falls in past yr: 0  Injury with Fall? 0  Risk for fall due to : No Fall Risks   Last Audit-C alcohol use screening    12/20/2023   10:48 AM  Alcohol Use Disorder Test (AUDIT)  1. How often do you have a drink containing alcohol? 0  2. How many drinks containing alcohol do you have on a typical day when you are drinking? 0  3. How often do you have six or more drinks on one occasion? 0  AUDIT-C Score 0   A score of 3 or more in women, and 4 or more in men indicates increased risk for alcohol abuse, EXCEPT if all of the points are from question 1   Results for orders placed or performed in visit on 05/08/24  Lipid panel  Result Value Ref Range  Cholesterol, Total 190 100 - 199 mg/dL   Triglycerides 857 0 - 149 mg/dL   HDL 44 >60 mg/dL   VLDL Cholesterol Cal 25 5 - 40 mg/dL   LDL Chol Calc (NIH) 878 (H) 0 - 99 mg/dL   Chol/HDL Ratio 4.3 0.0 - 4.4 ratio    Assessment & Plan    Routine Health Maintenance and Physical Exam  Exercise Activities and Dietary recommendations  Goals      Exercise 3x per week (30 min per time)        Immunization History  Administered Date(s) Administered   Fluad Trivalent(High Dose 65+) 07/18/2023   Influenza, High Dose Seasonal PF 08/03/2019, 08/07/2020   Influenza-Unspecified 08/16/2016   Moderna Covid-19 Vaccine Bivalent Booster 56yrs & up 10/19/2021   Moderna Sars-Covid-2 Vaccination 07/20/2020, 08/17/2020, 02/10/2021   Pneumococcal Conjugate-13 01/28/2019   Pneumococcal Polysaccharide-23 10/08/2009, 12/09/2020   Td 01/28/2019   Tdap 10/08/2009    Health Maintenance  Topic Date  Due   Zoster Vaccines- Shingrix (1 of 2) Never done   DEXA SCAN  02/02/2024   MAMMOGRAM  05/04/2024   COVID-19 Vaccine (5 - 2024-25 season) 08/07/2024 (Originally 07/09/2023)   INFLUENZA VACCINE  06/07/2024   Medicare Annual Wellness (AWV)  12/19/2024   Colonoscopy  03/13/2025   DTaP/Tdap/Td (3 - Td or Tdap) 01/27/2029   Pneumococcal Vaccine: 50+ Years  Completed   Hepatitis C Screening  Completed   Hepatitis B Vaccines  Aged Out   HPV VACCINES  Aged Out   Meningococcal B Vaccine  Aged Out   Lung Cancer Screening  Discontinued    Discussed health benefits of physical activity, and encouraged her to engage in regular exercise appropriate for her age and condition.   Annual physical exam  COPD with acute exacerbation (HCC) -     Azithromycin ; Take 2 tablets on day 1, then 1 tablet daily on days 2 through 5  Dispense: 6 tablet; Refill: 0  Centrilobular emphysema (HCC) -     Albuterol  Sulfate HFA; Inhale 2 puffs into the lungs every 6 (six) hours as needed for wheezing or shortness of breath.  Dispense: 8 g; Refill: 2 -     Spacer/Aero-Holding Chambers; 1 each by Does not apply route as directed. For use with inhalers  Dispense: 1 each; Refill: 1  Nicotine  dependence with current use -     Nicotine ; Place 1 patch (14 mg total) onto the skin daily.  Dispense: 28 patch; Refill: 0  Encounter for smoking cessation counseling -     Nicotine ; Place 1 patch (14 mg total) onto the skin daily.  Dispense: 28 patch; Refill: 0  Psoriasis -     Triamcinolone  Acetonide; Apply 1 Application topically daily as needed (for areas on face. avoid eyes). HOLD FOR NEXT FILL  Dispense: 80 g; Refill: 1  Endometrial cancer [facility]) Assessment & Plan: She has been release by Dr. Babara and will be following up with gynecological oncology in November 2025 for continued surveillance. Defer to specialist management.   Chemotherapy-induced peripheral neuropathy (HCC)  History of multiple pulmonary  nodules  Pulmonary arterial hypertension (HCC)  Atherosclerosis of aorta (HCC) -     Lipid panel  Need for shingles vaccine -     Zoster Vac Recomb Adjuvanted; Inject 0.5 mLs into the muscle once for 1 dose.  Dispense: 0.5 mL; Refill: 0     Annual physical exam Physical exam overall unremarkable except as noted above. Routine lab work ordered as noted.  Pending  mammogram and bone density tests. History of cataract surgery. COVID-19 boosters recommended due to COPD. - Encourage scheduling of mammogram and bone density tests. - Discuss importance of COVID-19 boosters, recommend obtaining at pharmacy. - Ensure scheduling of next wellness visit.  Centrilobular emphysema with exacerbation Likely COPD exacerbation with recent productive cough. Mild COPD, current smoker. Azithromycin  prescribed to prevent worsening.  Smoking cessation crucial for disease management. - Prescribe azithromycin  (Z-Pak). - Prescribe albuterol  inhaler for cough relief. - Educated on inhaler use, offered spacer.  Nicotine  dependence with current use; smoking cessation counseling Smokes half a pack per day since 1994. Interested in quitting. Discussed cessation options. - Discuss smoking cessation options: Chantix, Wellbutrin, nicotine  patches, gum. - Offer nicotine  patch prescription if covered by insurance.  History of multiple pulmonary nodules Due for follow-up LDCT 09/26/2024.  Psoriasis Chronic psoriasis managed with triamcinolone  cream. - Send prescription for triamcinolone  cream for future use.  Chemotherapy-induced peripheral neuropathy Continue duloxetine  30 mg daily.  Pulmonary arterial hypertension Noted through presence of enlarged pulmonary trunk on CT chest 09/27/2023.  Patient currently asymptomatic.  Atherosclerosis of aorta Continue to optimize risk factors.  Follow-up Next follow-up with gynecological oncology in October or November. Pulmonary nodules resolved, follow-up with  pulmonology advised. - Follow up with gynecological oncology in October or November. - Ensure follow-up with pulmonology.  Return in about 1 year (around 05/08/2025) for CPE.     I discussed the assessment and treatment plan with the patient  The patient was provided an opportunity to ask questions and all were answered. The patient agreed with the plan and demonstrated an understanding of the instructions.   The patient was advised to call back or seek an in-person evaluation if the symptoms worsen or if the condition fails to improve as anticipated.    LAURAINE LOISE BUOY, DO  [facility] [phone] (phone) [phone] (fax)  [facility]

## 2024-05-08 NOTE — Assessment & Plan Note (Signed)
 She has been release by Dr. Babara and will be following up with gynecological oncology in November 2025 for continued surveillance. Defer to specialist management.

## 2024-05-09 LAB — LIPID PANEL
Chol/HDL Ratio: 4.3 ratio (ref 0.0–4.4)
Cholesterol, Total: 190 mg/dL (ref 100–199)
HDL: 44 mg/dL (ref >39)
LDL Chol Calc (NIH): 121 mg/dL — ABNORMAL HIGH (ref 0–99)
Triglycerides: 142 mg/dL (ref 0–149)
VLDL Cholesterol Cal: 25 mg/dL (ref 5–40)

## 2024-05-14 ENCOUNTER — Ambulatory Visit: Payer: Self-pay | Admitting: Family Medicine

## 2024-05-14 DIAGNOSIS — I2721 Secondary pulmonary arterial hypertension: Secondary | ICD-10-CM | POA: Insufficient documentation

## 2024-05-15 MED ORDER — ROSUVASTATIN CALCIUM 5 MG PO TABS: 5.0000 mg | ORAL_TABLET | Freq: Every day | ORAL | 3 refills | Status: AC

## 2024-05-30 DIAGNOSIS — Z961 Presence of intraocular lens: Secondary | ICD-10-CM | POA: Diagnosis not present

## 2024-05-30 DIAGNOSIS — H53002 Unspecified amblyopia, left eye: Secondary | ICD-10-CM | POA: Diagnosis not present

## 2024-05-30 DIAGNOSIS — H43813 Vitreous degeneration, bilateral: Secondary | ICD-10-CM | POA: Diagnosis not present

## 2024-06-13 ENCOUNTER — Ambulatory Visit
Admission: RE | Admit: 2024-06-13 | Discharge: 2024-06-13 | Disposition: A | Discharge status: Home / Self Care | Source: Ambulatory Visit | Attending: Family Medicine | Admitting: Family Medicine

## 2024-06-13 DIAGNOSIS — Z780 Asymptomatic menopausal state: Secondary | ICD-10-CM | POA: Insufficient documentation

## 2024-06-13 DIAGNOSIS — Z1231 Encounter for screening mammogram for malignant neoplasm of breast: Secondary | ICD-10-CM | POA: Insufficient documentation

## 2024-06-13 DIAGNOSIS — M810 Age-related osteoporosis without current pathological fracture: Secondary | ICD-10-CM | POA: Diagnosis not present

## 2024-06-19 ENCOUNTER — Ambulatory Visit: Payer: Self-pay | Admitting: Family Medicine

## 2024-06-20 MED ORDER — ALENDRONATE SODIUM 70 MG PO TABS
70.0000 mg | ORAL_TABLET | ORAL | 3 refills | Status: AC
Start: 2024-06-20 — End: ?

## 2024-08-31 ENCOUNTER — Other Ambulatory Visit: Payer: Self-pay | Admitting: Family Medicine

## 2024-09-02 NOTE — Telephone Encounter (Signed)
 Discontinued on 05/08/24.  Requested Prescriptions  Pending Prescriptions Disp Refills   fluticasone  (FLONASE ) 50 MCG/ACT nasal spray [Pharmacy Med Name: FLUTICASONE  PROP 50 MCG SPRAY] 48 mL 2    Sig: SPRAY 2 SPRAYS INTO EACH NOSTRIL EVERY DAY     Ear, Nose, and Throat: Nasal Preparations - Corticosteroids Passed - 09/02/2024  3:56 PM      Passed - Valid encounter within last 12 months    Recent Outpatient Visits           3 months ago Annual physical exam   [facility], Lauraine SAILOR, DO   6 months ago Acute non-recurrent pansinusitis   [facility] [place], Jon HERO, MD

## 2024-09-19 ENCOUNTER — Telehealth: Payer: Self-pay | Admitting: Acute Care

## 2024-09-19 DIAGNOSIS — Z87891 Personal history of nicotine dependence: Secondary | ICD-10-CM

## 2024-09-19 DIAGNOSIS — Z122 Encounter for screening for malignant neoplasm of respiratory organs: Secondary | ICD-10-CM

## 2024-09-19 DIAGNOSIS — F17210 Nicotine dependence, cigarettes, uncomplicated: Secondary | ICD-10-CM

## 2024-09-19 NOTE — Telephone Encounter (Signed)
 Lung Cancer Screening Narrative/Criteria Questionnaire (Cigarette Smokers Only- No Cigars/Pipes/vapes)   Katherine Snyder   SDMV:09/26/24 @1030a Katherine Snyder                                           December 29, 1950              LDCT: 09/27/24@ 11am/Opic    73 y.o.   Phone: [phone]  Lung Screening Narrative (confirm age 12-77 yrs Medicare / 50-80 yrs Private pay insurance)   Insurance information:[employer]    Referring Provider:Yu   This screening involves an initial phone call with a team member from our program. It is called a shared decision making visit. The initial meeting is required by insurance and Medicare to make sure you understand the program. This appointment takes about 15-20 minutes to complete. The CT scan will completed at a separate date/time. This scan takes about 5-10 minutes to complete and you may eat and drink before and after the scan.  Criteria questions for Lung Cancer Screening:   Are you a current or former smoker? Current Age began smoking: 27y   If you are a former smoker, what year did you quit smoking? NA - less than 1 yr   To calculate your smoking history, I need an accurate estimate of how many packs of cigarettes you smoked per day and for how many years. (Not just the number of PPD you are now smoking)   Years smoking 46 x Packs per day 1/2 = Pack years 23   (at least 20 pack yrs)   (Make sure they understand that we need to know how much they have smoked in the past, not just the number of PPD they are smoking now)  Do you have a personal history of cancer?  Yes - (type and when diagnosed - 5 yrs cancer free) endometrial over 5 yrs    Do you have a family history of cancer? Yes  (cancer type and and relative) sister/colon and brother/prostate  Are you coughing up blood?  No  Have you had unexplained weight loss of 15 lbs or more in the last 6 months? No  It looks like you meet all criteria.     Additional information: N/A

## 2024-09-25 ENCOUNTER — Telehealth: Payer: Self-pay | Admitting: Obstetrics and Gynecology

## 2024-09-25 ENCOUNTER — Inpatient Hospital Stay: Payer: 59 | Attending: Oncology | Admitting: Obstetrics and Gynecology

## 2024-09-25 VITALS — BP 118/69 | HR 78 | Temp 97.6°F | Resp 18 | Ht 67.0 in | Wt 169.7 lb

## 2024-09-25 DIAGNOSIS — R1032 Left lower quadrant pain: Secondary | ICD-10-CM

## 2024-09-25 DIAGNOSIS — Z90722 Acquired absence of ovaries, bilateral: Secondary | ICD-10-CM | POA: Insufficient documentation

## 2024-09-25 DIAGNOSIS — Z08 Encounter for follow-up examination after completed treatment for malignant neoplasm: Secondary | ICD-10-CM | POA: Diagnosis present

## 2024-09-25 DIAGNOSIS — Z8542 Personal history of malignant neoplasm of other parts of uterus: Secondary | ICD-10-CM | POA: Insufficient documentation

## 2024-09-25 DIAGNOSIS — Z9079 Acquired absence of other genital organ(s): Secondary | ICD-10-CM | POA: Diagnosis not present

## 2024-09-25 DIAGNOSIS — Z9221 Personal history of antineoplastic chemotherapy: Secondary | ICD-10-CM | POA: Insufficient documentation

## 2024-09-25 DIAGNOSIS — F17210 Nicotine dependence, cigarettes, uncomplicated: Secondary | ICD-10-CM | POA: Insufficient documentation

## 2024-09-25 DIAGNOSIS — Z90710 Acquired absence of both cervix and uterus: Secondary | ICD-10-CM | POA: Diagnosis not present

## 2024-09-25 DIAGNOSIS — C541 Malignant neoplasm of endometrium: Secondary | ICD-10-CM

## 2024-09-25 DIAGNOSIS — Z923 Personal history of irradiation: Secondary | ICD-10-CM | POA: Diagnosis not present

## 2024-09-25 NOTE — Progress Notes (Signed)
 Gynecologic Oncology Interval Visit   Referring Provider: Dr. Janit  Chief Complaint: Endometrial Cancer  Subjective:  Katherine Snyder is a 73 y.o. 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  She is doing well other than left lower quadrant pain.  Dr. Babara 03/01/2024 and was NED; she released her from clinic. CA125 was normal. She is scheduled for a Chest CT this Friday.   Component Ref Range & Units (hover) 6 mo ago (03/01/24) 1 yr ago (03/02/23) 2 yr ago (03/21/22) 3 yr ago (09/20/21) 3 yr ago (03/19/21) 4 yr ago (09/22/20) 4 yr ago (04/13/20)  Cancer Antigen (CA) 125 7.9 7.3 CM 12.9 CM 9.1 CM 9.8 CM 12.2 CM 8.9 CM     She had a CT chest 09/27/2023 IMPRESSION: 1. No suspicious pulmonary nodules. Recommend annual lung cancer screening CT. Low-dose CT lung cancer screening is recommended for patients who are 28-12 years of age with a 20+ pack-year history of smoking, and who are currently smoking or quit <=15 years ago. 2.  Aortic atherosclerosis (ICD10-I70.0). 3. Enlarged pulmonic trunk, indicative of pulmonary arterial hypertension. 4.  Emphysema (ICD10-J43.9).     GYNECOLOGIC ONCOLOGY HISTORY:  Initially, patient presented to ER on 02/26/18 for PMB. She was reportedly 14 years post menopausal and had had a 2 day episode of vaginal bleeding that was constant.   02/26/18- Ultrasound Pelvic Complete w/ Transvaginal: Endometrium thickness up to 33mm. No focal solid or cystic change. IMPRESSION: Mildly enlarged uterus. No discrete fibroids. Markedly thickened endometrium. In the setting of post-menopausal bleeding, endometrial sampling is indicated to exclude carcinoma. If results are benign, sonohysterogram should be considered for focal lesion work-up. (Ref: Radiological Reasoning: Algorithmic Workup of Abnormal  Vaginal Bleeding with Endovaginal Sonography and Sonohysterography. AJR 2008; [id number]:D31-26) Nonvisualization of the right ovary. Normal-appearing left ovary. No significant uterine fibroids were observed.  She was seen by Dr. Artist son 03/01/18 who performed vacurette endometrial biopsy. Uterus was sounded to length to 9 cm. She was started on Aygestin  and bleeding stopped.   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  COMMENT: BASED ON INITIAL HISTOLOGIC FINDINGS, IMMUNOHISTOCHEMISTRY IS EVALUATED:  THE SARCOMATOUS TUMOR COMPONENT IS DIFFUSELY POSITIVE FOR CD10 AND THE CARCINOMA COMPONENT IS POSITIVE FOR PANKERATIN, SUPPORTING THE DIAGNOSIS.   She was seen by Dr. Mancil, Gyn-Onc on 03/28/2018 for initial evaluation and treatment planning.  TLH BSO with sentinel lymph node mapping and biopsies and aortic lymph node dissection were discussed.  Surgery at [facility] was recommended but unfortunately, patient's insurance will not cover.   CA 125- 10.5  03/30/18 - CT C/A/P W CONTRAST IMPRESSION: 1. Mass like expansion of the endometrial cavity known primary endometrial carcinoma. 2. No evidence for nodal metastasis or solid organ metastasis within the abdomen or pelvis. 3. Scattered nonspecific solid nodules and a single part solid nodule identified in both lungs. Consensus criteria recommendations for nodule followup does not apply to patients with known primary malignancy. 4. Liver cysts.  She underwent CT Imaging and then robot assisted total hysterectomy with sentinel LN mapping, biopsies, and aortic LN dissection with Dr. Elby at [facility] 04/04/18.   A. UTERUS WITH CERVIX; HYSTERECTOMY:  - CARCINOSARCOMA.  - MYOMETRIAL INVASION PRESENT, LESS THAN HALF OF THE MYOMETRIUM.  - NEGATIVE FOR CERVICAL AND SEROSAL INVOLVEMENT.  -  SUBMUCOSAL LEIOMYOMA.   FALLOPIAN TUBE AND OVARY, LEFT;  SALPINGO-OOPHORECTOMY:  - NEGATIVE FOR MALIGNANCY.  - OVARIAN CORTICAL INCLUSION CYSTS.  - ATROPHIC FALLOPIAN TUBE.   B. SENTINEL LYMPH NODE, RIGHT PRIMARY OBTURATOR; EXCISION:  - NEGATIVE FOR MALIGNANCY, ONE LYMPH NODE (0/1).   C. LYMPH NODE, RIGHT EXTERNAL ILIAC VEIN; EXCISION:  - NEGATIVE FOR MALIGNANCY, ONE LYMPH NODE (0/1).   D. FALLOPIAN TUBE AND OVARY, UNILATERAL (RIGHT); SALPINGO-OOPHORECTOMY:  - NEGATIVE FOR MALIGNANCY.  - OVARIAN CORTICAL INCLUSION CYSTS.  - ATROPHIC FALLOPIAN TUBE.   E. SENTINEL LYMPH NODE, RIGHT LOW PARA-AORTIC; EXCISION:  - NEGATIVE FOR MALIGNANCY, TWO LYMPH NODES (0/2).   F. SENTINEL LYMPH NODE, LEFT PRIMARY LOW PARA-AORTIC; EXCISION:  - NEGATIVE FOR MALIGNANCY, ONE LYMPH NODE (0/1).   DIAGNOSIS:  A.  PELVIC WASHINGS:  - NEGATIVE FOR MALIGNANCY.    Her case was discussed at [facility] and discussed that recurrence risk without adjuvant treatment is up to 50%.  So recommend adjuvant chemo and radiation.  She was seen by Dr. Lenn, for initial evaluation of stage IA uterine carcinosarcoma and plan is for brachytherapy after 6 cycles of carbo/taxol .    She initiated carbo-Taxol  on 04/25/2018.  Completed 6 cycles on 08/08/2018.  She received vaginal brachytherapy with Dr. Lenn 10/19-11/09/2018.   She had thyroid  ultrasound for incidental thyroid  nodule seen on surveillance imaging for indeterminate pulmonary nodules on 5/20. Biopsy was benign.   07/11/18- CT Chest WO Contrast for follow-up on pulmonary nodules IMPRESSION: 1. Previously visualized pulmonary nodules are either stable or resolved. Several new subcentimeter bilateral pulmonary nodules. Pulmonary nodularity generally appears centrilobular in distribution and while considered indeterminate is more suggestive of infectious/inflammatory nodularity. Continued chest CT surveillance is recommended in 3-6 months. 2. No thoracic adenopathy.  12/10/2018- CT Chest wo contrast - New sub solid  density measuring 12 mm with associated 3 mm solid nodule is noted in left upper lobe. There also noted several other new nodules noted more posteriorly in the left upper lobe, with the largest measuring 3 mm. These may simply be inflammatory in etiology, but neoplasm can not be excluded. Initial follow-up by chest CT without contrast is recommended in 3 months to confirm persistence. - Stable sub solid density is noted in right upper lobe with grossly stable 4 mm solid nodule. Multiple other pulmonary nodules are noted in both lungs which are stable compared to prior exam - Small nonobstructive right renal calculus - Stable left thyroid  nodule - Stable hepatic cyst  She stopped smoking 2/20, but occasionally using vaping products.    CT scan chest 5/20 IMPRESSION: 1. Stable scattered pulmonary nodules as detailed above including a 1.2 cm part solid nodule in the right upper lobe. The solid component remains stable at approximately 0.4 cm. Follow-up non-contrast CT recommended at 3-6 months to confirm persistence. If unchanged, and solid component remains <6 mm, annual CT is recommended until 5 years of stability has been established. If persistent these nodules should be considered highly suspicious if the solid component of the nodule is 6 mm or greater in size and enlarging. This recommendation follows the consensus statement: Guidelines for Management of Incidental Pulmonary Nodules Detected on CT Images: From the Fleischner Society 2017; Radiology 2017; 284:228-243. 2. Additional scattered pulmonary nodules as detailed above measuring up to approximately 8 mm. These are stable from prior study. Attention on yearly follow-up examinations is recommended. 3. Bilateral nonobstructing renal nephroliths are again noted.  4. Mild bilateral interlobular septal thickening consistent with mild  volume overload.  CT scan 8/21 IMPRESSION: 1. No acute intrathoracic, abdominal, or pelvic pathology. 2. No  evidence of metastatic disease in the chest, abdomen, or pelvis. 3. Bilateral pulmonary nodules similar to prior CT. 4. Small nonobstructing right renal interpolar calculus. 5. Colonic diverticulosis. No bowel obstruction. 6. Aortic Atherosclerosis (ICD10-I70.0).   CT A/P 07/21/2021 1. New posterior LEFT upper lobe subpleural nodules, largest measuring up to 1.0 cm (see key image). Attention on follow-up. Additional pulmonary nodules are similar to comparison. 2. No evidence to suggest metastatic disease within the chest, abdomen or pelvis. 3. Interval port removal. Additional chronic and senescent changes, as above.   CT chest 01/17/2022 IMPRESSION: 1. Interval resolution of previously seen new nodules in the posterior left apex.   2. New small bilateral pulmonary nodules measuring 0.6 cm and smaller. Occasional additional unchanged nodules scattered bilaterally.   3. Areas of clustered centrilobular and tree-in-bud nodularity bilaterally, as well as mild, bandlike scarring and consolidation of the medial segment right middle lobe and lingula.   4. Fluctuance of findings is consistent with a benign, infectious or inflammatory etiology, and constellation is particularly suggestive of atypical mycobacterial infection.    Primary care Colonoscopy on 03/13/20 with benign polyps and she will repeat in 5 years.  Mammogram 06/13/2024: No mammographic evidence of malignancy. A result letter of this screening mammogram will be mailed directly to the patient. RECOMMENDATION: Screening mammogram in one year. (Code:[id number]) BI-RADS CATEGORY  1: Negative.   Problem List: Patient Active Problem List   Diagnosis Date Noted   Pulmonary arterial hypertension (HCC) 05/14/2024   Atherosclerosis of aorta 05/08/2024   Chemotherapy-induced peripheral neuropathy 03/02/2023   Overweight with body mass index (BMI) of 26 to 26.9 in adult 10/05/2022   Neuropathy 05/16/2018   Fatigue 05/16/2018    Inflammatory heel pain, right 05/16/2018   Encounter for smoking cessation counseling 05/16/2018   Drug-induced neutropenia 05/09/2018   Goals of care, counseling/discussion 04/18/2018   Endometrial cancer (HCC)    Seborrhea 05/13/2016   History of kidney stones 06/26/2015   OP (osteoporosis) 06/26/2015   Plantar fasciitis 06/26/2015   Current tobacco use 06/26/2015   Kidney stone 06/21/2013   Chronic airway obstruction (HCC) 10/08/2009   Psoriasis 09/30/2008   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   Menopausal symptom 09/21/2007   Tobacco use 09/21/2007   Awareness of heartbeats 09/21/2007    Past Medical History: Past Medical History:  Diagnosis Date   Arthritis    Cancer (HCC) 2019   endometrial   Endometrial mass    History of chicken pox    History of kidney stones    History of measles    History of mumps    Osteoporosis    Personal history of chemotherapy    Personal history of radiation therapy    PMB (postmenopausal bleeding)    Psoriasis    Wears dentures    full upper    Past Surgical History: Past Surgical History:  Procedure Laterality Date   APPENDECTOMY  1960   BREAST EXCISIONAL BIOPSY Left 1980   neg   BREAST SURGERY Left    biopsy   CATARACT EXTRACTION W/PHACO Left 08/30/2023   Procedure: CATARACT EXTRACTION PHACO AND INTRAOCULAR LENS PLACEMENT (IOC) LEFT;  Surgeon: Mittie Gaskin, MD;  Location: [facility];  Service: Ophthalmology;  Laterality: Left;  6.13 0:39.9   CATARACT EXTRACTION W/PHACO Right 09/13/2023   Procedure: CATARACT EXTRACTION PHACO AND INTRAOCULAR LENS PLACEMENT (IOC) RIGHT 5.46 00:26.9;  Surgeon: Mittie Gaskin,  MD;  Location: [facility];  Service: Ophthalmology;  Laterality: Right;   COLONOSCOPY WITH PROPOFOL  N/A 03/13/2020   Procedure: COLONOSCOPY WITH PROPOFOL ;  Surgeon: Therisa Bi, MD;  Location: [facility];  Service: Gastroenterology;  Laterality: N/A;   Injection varicose  veins in legs Bilateral 2010   Dr. Primus   LITHOTRIPSY  1994, 2004   for renal stones   PORTA CATH INSERTION N/A 04/24/2018   Procedure: PORTA CATH INSERTION;  Surgeon: Jama Cordella MATSU, MD;  Location: [facility];  Service: Cardiovascular;  Laterality: N/A;   PORTA CATH REMOVAL N/A 01/18/2021   Procedure: PORTA CATH REMOVAL;  Surgeon: Marea Selinda RAMAN, MD;  Location: [facility];  Service: Cardiovascular;  Laterality: N/A;   ROBOTIC ASSISTED TOTAL HYSTERECTOMY Bilateral 04/04/2018   Procedure: ROBOTIC ASSISTED TOTAL HYSTERECTOMY,SENTINEL LYMPH NODE MAPPING AND BIOPSIES,AORTIC LYMPH NODE DISSECTION;  Surgeon: Elby Webb Loges, MD;  Location: [facility];  Service: Gynecology;  Laterality: Bilateral;   TUBAL LIGATION  1980    OB History:  OB History  Gravida Para Term Preterm AB Living  1 1 1   1   SAB IAB Ectopic Multiple Live Births      1    # Outcome Date GA Lbr Len/2nd Weight Sex Type Anes PTL Lv  1 Term 83    M Vag-Spont   LIV  [id number] LMP:   Family History: Family History  Problem Relation Age of Onset   Breast cancer Mother    Transient ischemic attack Mother    Arthritis Mother    Hypothyroidism Mother    Lung cancer Mother    Diabetes Father        type 2   Hypertension Father    Arthritis Father    Colon cancer Sister    Prostate cancer Brother    Hyperlipidemia Son    Stomach cancer Maternal Grandmother    Stroke Maternal Grandfather     Social History: Social History   Socioeconomic History   Marital status: Divorced    Spouse name: single   Number of children: 1   Years of education: Not on file   Highest education level: 10th grade  Occupational History   Occupation: retired  Tobacco Use   Smoking status: Every Day    Current packs/day: 0.50    Average packs/day: 0.5 packs/day for 31.9 years (15.9 ttl pk-yrs)    Types: Cigarettes    Start date: 1994   Smokeless tobacco: Never  Vaping Use   Vaping status: Former  Substance  and Sexual Activity   Alcohol use: No    Alcohol/week: 0.0 standard drinks of alcohol   Drug use: No   Sexual activity: Not Currently  Other Topics Concern   Not on file  Social History Narrative   Not on file   Social Drivers of Health   Financial Resource Strain: Low Risk  (12/20/2023)   Overall Financial Resource Strain (CARDIA)    Difficulty of Paying Living Expenses: Not hard at all  Food Insecurity: No Food Insecurity (12/20/2023)   Hunger Vital Sign    Worried About Running Out of Food in the Last Year: Never true    Ran Out of Food in the Last Year: Never true  Transportation Needs: No Transportation Needs (12/20/2023)   PRAPARE - Administrator, Civil Service (Medical): No    Lack of Transportation (Non-Medical): No  Physical Activity: Inactive (12/20/2023)   Exercise Vital Sign    Days of Exercise  per Week: 0 days    Minutes of Exercise per Session: 0 min  Stress: No Stress Concern Present (12/20/2023)   [employer] of Occupational Health - Occupational Stress Questionnaire    Feeling of Stress : Not at all  Social Connections: Moderately Isolated (12/20/2023)   Social Connection and Isolation Panel    Frequency of Communication with Friends and Family: Once a week    Frequency of Social Gatherings with Friends and Family: More than three times a week    Attends Religious Services: More than 4 times per year    Active Member of [employer] or Organizations: No    Attends Banker Meetings: Never    Marital Status: Divorced  Catering Manager Violence: Not At Risk (12/20/2023)   Humiliation, Afraid, Snyder, and Kick questionnaire    Fear of Current or Ex-Partner: No    Emotionally Abused: No    Physically Abused: No    Sexually Abused: No    Allergies: No Known Allergies  Current Medications: Current Outpatient Medications  Medication Sig Dispense Refill   albuterol  (VENTOLIN  HFA) 108 (90 Base) MCG/ACT inhaler Inhale 2 puffs into the lungs  every 6 (six) hours as needed for wheezing or shortness of breath. 8 g 2   alendronate  (FOSAMAX ) 70 MG tablet Take 1 tablet (70 mg total) by mouth every 7 (seven) days. Take with a full glass of water on an empty stomach. 12 tablet 3   DULoxetine  (CYMBALTA ) 30 MG capsule Take 1 capsule (30 mg total) by mouth daily. 90 capsule 1   loratadine  (CLARITIN ) 10 MG tablet Take 1 tablet (10 mg total) by mouth daily. 30 tablet 11   Multiple Vitamins-Minerals (CENTRUM SILVER ADULT 50+ PO) Take 2 tablets by mouth daily.     naproxen  sodium (ALEVE ) 220 MG tablet Take 220 mg by mouth daily as needed.     rosuvastatin  (CRESTOR ) 5 MG tablet Take 1 tablet (5 mg total) by mouth daily. 90 tablet 3   Spacer/Aero-Holding Chambers DEVI 1 each by Does not apply route as directed. For use with inhalers 1 each 1   triamcinolone  (KENALOG ) 0.025 % cream Apply 1 Application topically daily as needed (for areas on face. avoid eyes). HOLD FOR NEXT FILL 80 g 1   nicotine  (NICODERM CQ  - DOSED IN MG/24 HOURS) 14 mg/24hr patch Place 1 patch (14 mg total) onto the skin daily. (Patient not taking: Reported on 09/25/2024) 28 patch 0   No current facility-administered medications for this visit.   Review of Systems General: no complaints  HEENT: no complaints  Lungs: no complaints  Cardiac: no complaints  GI: LLQ pain o/w no complaints  GU: no complaints  Musculoskeletal: no complaints  Extremities: no complaints  Skin: no complaints  Neuro: no complaints  Endocrine: no complaints  Psych: no complaints       Objective:  Physical Examination:  Vitals:   09/25/24 0850  BP: 118/69  Pulse: 78  Resp: 18  Temp: 97.6 F (36.4 C)  SpO2: 97%     ECOG Performance Status: 0  GENERAL: Patient is a well appearing female in no acute distress HEENT:  PERRL atraumatic and normocephalic NODES:  No cervical, supraclavicular, axillary, or inguinal lymphadenopathy palpated.  LUNGS:  Normal respiratory effort ABDOMEN:  Soft,  nontender.  No masses, hernias, or ascites. Incisions well healed.  EXTREMITIES:  No peripheral edema.   SKIN:  Clear with no obvious rashes or skin changes. No nail dyscrasia. NEURO:  Nonfocal. Well oriented.  Appropriate affect.  Pelvic: EGBUS: no lesions Cervix: absent Vagina: no lesions, no discharge or bleeding; use narrow speculum.  Uterus: absent BME: no palpable masses   Radiology Per HPI and interval history    Assessment:  Katherine Snyder is a 73 y.o. female diagnosed with stage IA uterine carcinosarcoma with RA TLH/BSO, negative staging 5/19 followed by vaginal brachytherapy and 6 cycles of carbo/taxol  chemotherapy completed 08/08/2018.  NED on exam today.    LLQ pain x 2 weeks uncertain etiology  Chest CTs have  shown indeterminate lung nodules,  no new nodules noted on last CT. She is a current smoker.   Hepatic cyst, stable on CT.   H/o nonobstructing kidney stones on CT   Comorbidities complicating care: smoker, plantar fasciitis  Plan:   Problem List Items Addressed This Visit       Genitourinary   Endometrial cancer (HCC) - Primary (Chronic)   Relevant Orders   CT ABDOMEN PELVIS W CONTRAST   Other Visit Diagnoses       Abdominal pain, left lower quadrant       Relevant Orders   CT ABDOMEN PELVIS W CONTRAST        Previously reviewed NCCN guidelines for surveillance including physical exam every 3-6 months for 2- 3 years then every 6 months for years 4-5 then annually thereafter.  Given pain will follow up in 6 months.   Repeat Chest CT follow up pulmonary nodules; Add CT A/P to follow up abdominal pain.   She agrees with this plan.   The patient's diagnosis, an outline of the further diagnostic and laboratory studies which will be required, the recommendation, and alternatives were discussed.  All questions were answered to the patient's satisfaction.  I personally had a face to face interaction and evaluated the patient. I have reviewed her  history and available records and have performed the physical exam.  I have discussed the case with the patient.   Amadou Katzenstein Isidor Constable, MD

## 2024-09-25 NOTE — Telephone Encounter (Signed)
 Called pt to sched CT - told her that I had spoken w/[facility] and they were okay to have [facility] scheduled at the same time as the other CT to save her some time - pt said she didn't need an appt reminder and confirmed that date/time/location still work for her - [facility]

## 2024-09-25 NOTE — Telephone Encounter (Signed)
 Called pt to sched CT - no answer and no vm set up - will call again - [facility]

## 2024-09-26 ENCOUNTER — Ambulatory Visit: Admitting: *Deleted

## 2024-09-26 NOTE — Progress Notes (Signed)
 This encounter was created in error - please disregard.  Unable to reach patient.  No VM set up to leave message.

## 2024-09-27 ENCOUNTER — Ambulatory Visit: Admission: RE | Admit: 2024-09-27 | Source: Ambulatory Visit

## 2024-09-27 ENCOUNTER — Ambulatory Visit
Admission: RE | Admit: 2024-09-27 | Discharge: 2024-09-27 | Disposition: A | Discharge status: Home / Self Care | Source: Ambulatory Visit | Attending: Acute Care | Admitting: Acute Care

## 2024-09-27 ENCOUNTER — Ambulatory Visit
Admission: RE | Admit: 2024-09-27 | Discharge: 2024-09-27 | Disposition: A | Discharge status: Home / Self Care | Source: Ambulatory Visit | Attending: Obstetrics and Gynecology | Admitting: Obstetrics and Gynecology

## 2024-09-27 DIAGNOSIS — Z87891 Personal history of nicotine dependence: Secondary | ICD-10-CM | POA: Diagnosis present

## 2024-09-27 DIAGNOSIS — R1032 Left lower quadrant pain: Secondary | ICD-10-CM | POA: Insufficient documentation

## 2024-09-27 DIAGNOSIS — C541 Malignant neoplasm of endometrium: Secondary | ICD-10-CM | POA: Diagnosis present

## 2024-09-27 DIAGNOSIS — Z122 Encounter for screening for malignant neoplasm of respiratory organs: Secondary | ICD-10-CM

## 2024-09-27 DIAGNOSIS — F17210 Nicotine dependence, cigarettes, uncomplicated: Secondary | ICD-10-CM | POA: Insufficient documentation

## 2024-09-27 MED ORDER — IOHEXOL 300 MG/ML  SOLN
100.0000 mL | Freq: Once | INTRAMUSCULAR | Status: AC | PRN
  Administered 2024-09-27: 100 mL via INTRAVENOUS

## 2024-10-07 ENCOUNTER — Telehealth: Payer: Self-pay | Admitting: Acute Care

## 2024-10-07 ENCOUNTER — Telehealth: Payer: Self-pay | Admitting: *Deleted

## 2024-10-07 DIAGNOSIS — R911 Solitary pulmonary nodule: Secondary | ICD-10-CM

## 2024-10-07 DIAGNOSIS — Z87891 Personal history of nicotine dependence: Secondary | ICD-10-CM

## 2024-10-07 NOTE — Telephone Encounter (Signed)
 Please call patient and she if she has been sick. If she has been sick , a 6 month follow up is fine. This will be due 03/2025. Please have her follow up with her PCP for treatment if she has been sick.  If she has not been sick, we will do a 3 month follow up scan. Please fax results to PCP and let them know plan. Thanks so much

## 2024-10-07 NOTE — Telephone Encounter (Signed)
 Call report from [facility]:  IMPRESSION: Lung-RADS 3, probably benign findings. Short-term follow-up in 6 months is recommended with repeat low-dose chest CT without contrast (please use the following order, CT CHEST LCS NODULE FOLLOW-UP W/O CM). Bilateral solid and mixed attenuation pulmonary nodules as detailed above.   New or increased clustered nodularity in the posterior right upper lobe, likely due to interval infectious bronchiolitis.   Aortic atherosclerosis (ICD10-I70.0) and emphysema (ICD10-J43.9).   Abdominopelvic CT dictated separately.

## 2024-10-08 ENCOUNTER — Ambulatory Visit: Payer: Self-pay

## 2024-10-08 NOTE — Addendum Note (Signed)
 Addended by: ANITRA AQUAS D on: 10/08/2024 08:45 AM   Modules accepted: Orders

## 2024-10-08 NOTE — Telephone Encounter (Signed)
 Patient call note and symptoms reviewed. Agree with scheduled appt. Will evaluate during OV

## 2024-10-08 NOTE — Telephone Encounter (Signed)
 Spoke with pt and reviewed lung screening CT results. Pt does report having some respiratory congestion and cough at time of the scan. She has not reported this to PCP yet. I advised pt that we plan to repeat her CT in 6 months but she will need to be clear of any infectious symptoms at that time. Pt verbalized understanding and is aware we will contact her closer to 6 months to repeat scan. Results/ plans faxed to PCP.

## 2024-10-08 NOTE — Telephone Encounter (Signed)
 FYI Only or Action Required?: Action required by provider: request for appointment.  Patient was last seen in primary care on 05/08/2024 by Katherine Lauraine SAILOR, DO.  Called Nurse Triage reporting Cough.  Symptoms began several weeks ago.  Interventions attempted: OTC medications: not helping.  Symptoms are: gradually worsening. Productive cough, SOB, wheezing.  Triage Disposition: See Physician Within 24 Hours  Patient/caregiver understands and will follow disposition?: Yes     Copied from CRM #[id number]. Topic: Clinical - Red Word Triage >> Oct 08, 2024  9:11 AM Katherine Snyder wrote: [facility] that prompted transfer to Nurse Triage: Patient states she has a really bad productive cough with phlegm and she can't get rid of it. Reason for Disposition  [1] Continuous (nonstop) coughing interferes with work or school AND [2] no improvement using cough treatment per Care Advice  Answer Assessment - Initial Assessment Questions 1. ONSET: When did the cough begin?      2 weeks 2. SEVERITY: How bad is the cough today?      severe 3. SPUTUM: Describe the color of your sputum (e.g., none, dry cough; clear, white, yellow, green)     cream 4. HEMOPTYSIS: Are you coughing up any blood? If Yes, ask: How much? (e.g., flecks, streaks, tablespoons, etc.)     no 5. DIFFICULTY BREATHING: Are you having difficulty breathing? If Yes, ask: How bad is it? (e.g., mild, moderate, severe)      Mild with exertion 6. FEVER: Do you have a fever? If Yes, ask: What is your temperature, how was it measured, and when did it start?     no 7. CARDIAC HISTORY: Do you have any history of heart disease? (e.g., heart attack, congestive heart failure)      no 8. LUNG HISTORY: Do you have any history of lung disease?  (e.g., pulmonary embolus, asthma, emphysema)     no 9. PE RISK FACTORS: Do you have a history of blood clots? (or: recent major surgery, recent prolonged travel, bedridden)     no 10.  OTHER SYMPTOMS: Do you have any other symptoms? (e.g., runny nose, wheezing, chest pain)       Wheezing, 11. PREGNANCY: Is there any chance you are pregnant? When was your last menstrual period?       no 12. TRAVEL: Have you traveled out of the country in the last month? (e.g., travel history, exposures)       no  Protocols used: Cough - Acute Productive-A-AH

## 2024-10-08 NOTE — Telephone Encounter (Signed)
 See other telephone note from 10/07/24

## 2024-10-09 ENCOUNTER — Encounter: Payer: Self-pay | Admitting: Family Medicine

## 2024-10-09 ENCOUNTER — Ambulatory Visit: Admitting: Family Medicine

## 2024-10-09 VITALS — BP 133/81 | HR 61 | Temp 98.6°F | Ht 67.0 in | Wt 169.0 lb

## 2024-10-09 DIAGNOSIS — R051 Acute cough: Secondary | ICD-10-CM

## 2024-10-09 DIAGNOSIS — J441 Chronic obstructive pulmonary disease with (acute) exacerbation: Secondary | ICD-10-CM | POA: Diagnosis not present

## 2024-10-09 DIAGNOSIS — J439 Emphysema, unspecified: Secondary | ICD-10-CM | POA: Diagnosis not present

## 2024-10-09 LAB — POC COVID19 BINAXNOW: SARS Coronavirus 2 Ag: NEGATIVE

## 2024-10-09 LAB — POCT INFLUENZA A/B
Influenza A, POC: NEGATIVE
Influenza B, POC: NEGATIVE

## 2024-10-09 MED ORDER — AMOXICILLIN-POT CLAVULANATE 875-125 MG PO TABS: 1.0000 | ORAL_TABLET | Freq: Two times a day (BID) | ORAL | 0 refills | Status: AC

## 2024-10-09 MED ORDER — PREDNISONE 20 MG PO TABS: 40.0000 mg | ORAL_TABLET | Freq: Every day | ORAL | 0 refills | Status: AC

## 2024-10-09 NOTE — Progress Notes (Signed)
 ACUTE VISIT   Patient: Katherine Snyder   DOB: [birth year]   73 y.o. Female  MRN: [id number]   PCP: Donzella Lauraine SAILOR, DO  Chief Complaint  Patient presents with   Cough    Productive cough x 2 weeks with SOB and wheezing. Patient says she recently had Chest CT done and it looked like she may have an infection  Denies fever, some chills   Subjective    HPI HPI     Cough    Additional comments: Productive cough x 2 weeks with SOB and wheezing. Patient says she recently had Chest CT done and it looked like she may have an infection  Denies fever, some chills      Last edited by Cherry Chiquita HERO, CMA on 10/09/2024  2:16 PM.       Discussed the use of AI scribe software for clinical note transcription with the patient, who gave verbal consent to proceed.  History of Present Illness Katherine Snyder is a 73 year old female with pulmonary hypertension and emphysema who presents with shortness of breath and wheezing.  She has been experiencing shortness of breath and wheezing for the past two weeks. A recent chest CT scan suggested a possible infection. No fever, but she has chills. She has developed a new productive cough with sputum production. Additionally, she experienced sneezing and a sore throat, which have since resolved.  She has a significant smoking history with a 23-pack year history and is currently enrolled in a lung cancer screening program. Her recent chest CT, completed on December 1st, 2025, revealed bilateral solid and mixed attenuation pulmonary nodules and new clustered nodular changes in the posterior right upper lobe. She uses albuterol  as needed and is not on any chronic inhalers.  Her past medical history includes endometrial cancer within the last five years, osteoporosis, pulmonary hypertension, emphysema, and atherosclerosis. She is currently taking Crestor  5 mg for atherosclerosis management.  A recent CT of the abdomen showed a 5 mm non-obstructing stone in  the left renal pelvis and a 7 mm left lower pulmonary nodule that was stable compared to previous imaging. She has taken laxatives to manage constipation and reports improvement in her symptoms.     Medications: Outpatient Medications Prior to Visit  Medication Sig   albuterol  (VENTOLIN  HFA) 108 (90 Base) MCG/ACT inhaler Inhale 2 puffs into the lungs every 6 (six) hours as needed for wheezing or shortness of breath.   alendronate  (FOSAMAX ) 70 MG tablet Take 1 tablet (70 mg total) by mouth every 7 (seven) days. Take with a full glass of water on an empty stomach.   DULoxetine  (CYMBALTA ) 30 MG capsule Take 1 capsule (30 mg total) by mouth daily.   loratadine  (CLARITIN ) 10 MG tablet Take 1 tablet (10 mg total) by mouth daily.   Multiple Vitamins-Minerals (CENTRUM SILVER ADULT 50+ PO) Take 2 tablets by mouth daily.   naproxen  sodium (ALEVE ) 220 MG tablet Take 220 mg by mouth daily as needed.   rosuvastatin  (CRESTOR ) 5 MG tablet Take 1 tablet (5 mg total) by mouth daily.   Spacer/Aero-Holding Chambers DEVI 1 each by Does not apply route as directed. For use with inhalers   triamcinolone  (KENALOG ) 0.025 % cream Apply 1 Application topically daily as needed (for areas on face. avoid eyes). HOLD FOR NEXT FILL   nicotine  (NICODERM CQ  - DOSED IN MG/24 HOURS) 14 mg/24hr patch Place 1 patch (14 mg total) onto the skin daily. (Patient  not taking: Reported on 10/09/2024)   No facility-administered medications prior to visit.        Objective    BP 133/81 (BP Location: Right Arm, Patient Position: Sitting, Cuff Size: Normal)   Pulse 61   Temp 98.6 F (37 C) (Oral)   Ht 5' 7 (1.702 m)   Wt 169 lb (76.7 kg)   SpO2 97%   BMI 26.47 kg/m    Physical Exam   Physical Exam GENERAL: No respiratory distress. CHEST: Lungs clear to auscultation bilaterally, no wheezes or crackles. Normal respiratory effort on room air.   No results found for any visits on 10/09/24.  Assessment & Plan      Assessment and Plan Assessment & Plan Acute exacerbation of emphysema Two weeks of dyspnea, wheezing, and productive cough. Recent chest CT suggests possible infection. No fever, but chills present. Negative COVID and influenza tests. Physical exam reveals no wheezing or crackles, normal respiratory effort. Low threshold for antibiotic and steroid treatment due to history of emphysema and recent imaging findings. - Prescribed Augmentin  875 mg/125 mg twice daily for 7 days - Prescribed prednisone 40 mg daily for 5 days - Advised follow-up with PCP in 7-10 days if not improved  Pulmonary nodules under surveillance Bilateral solid and mixed attenuation pulmonary nodules with new clustered nodular opacities in the posterior right upper lobe, possibly bronchiolitis. Nodules are under surveillance with a 59-month follow-up plan for repeat low-dose chest CT. - Continue surveillance with repeat low-dose chest CT in 6 months - f/u with pulmonology as scheduled   Atherosclerosis Chronic  Aortic atherosclerosis noted on imaging, consistent with previous findings. Managed with Crestor  5 mg. - Continue Crestor  5 mg daily  Nephrolithiasis, left kidney 5 mm non-obstructing stone in the left renal pelvis, consistent with previous imaging. No acute findings or hydronephrosis.      No follow-ups on file.        Rockie Agent, MD  [facility] [phone] (phone) [phone] (fax)  [facility]

## 2024-10-11 ENCOUNTER — Other Ambulatory Visit: Payer: Self-pay | Admitting: Family Medicine

## 2024-10-14 ENCOUNTER — Telehealth: Payer: Self-pay

## 2024-10-14 NOTE — Telephone Encounter (Signed)
 Called results of CT AP to Katherine Snyder.  IMPRESSION: 1. No acute findings in the abdomen or pelvis. Specifically, no findings to explain the patient's history of left lower quadrant pain.  She reports she has not had any further abdominal discomfort.

## 2024-12-25 ENCOUNTER — Ambulatory Visit: Payer: 59

## 2025-03-26 ENCOUNTER — Inpatient Hospital Stay
